# Patient Record
Sex: Male | Born: 1940 | Race: Black or African American | Hispanic: No | Smoking: Former smoker
Health system: Southern US, Community
[De-identification: ages and names within clinical notes are randomized; demographics above are authoritative.]

## PROBLEM LIST (undated history)

## (undated) DIAGNOSIS — J449 Chronic obstructive pulmonary disease, unspecified: Secondary | ICD-10-CM

## (undated) DIAGNOSIS — R079 Chest pain, unspecified: Secondary | ICD-10-CM

## (undated) DIAGNOSIS — F0390 Unspecified dementia without behavioral disturbance: Secondary | ICD-10-CM

## (undated) DIAGNOSIS — E119 Type 2 diabetes mellitus without complications: Secondary | ICD-10-CM

## (undated) DIAGNOSIS — Z87891 Personal history of nicotine dependence: Secondary | ICD-10-CM

## (undated) DIAGNOSIS — Z9181 History of falling: Secondary | ICD-10-CM

## (undated) DIAGNOSIS — I639 Cerebral infarction, unspecified: Secondary | ICD-10-CM

## (undated) DIAGNOSIS — I10 Essential (primary) hypertension: Secondary | ICD-10-CM

## (undated) DIAGNOSIS — E785 Hyperlipidemia, unspecified: Secondary | ICD-10-CM

## (undated) DIAGNOSIS — C801 Malignant (primary) neoplasm, unspecified: Secondary | ICD-10-CM

## (undated) DIAGNOSIS — R42 Dizziness and giddiness: Secondary | ICD-10-CM

## (undated) HISTORY — DX: Cerebral infarction, unspecified: I63.9

## (undated) HISTORY — DX: Personal history of nicotine dependence: Z87.891

## (undated) HISTORY — DX: Hyperlipidemia, unspecified: E78.5

## (undated) HISTORY — DX: Chest pain, unspecified: R07.9

---

## 1997-09-12 ENCOUNTER — Encounter: Admission: RE | Admit: 1997-09-12 | Discharge: 1997-09-12 | Payer: Self-pay | Admitting: Internal Medicine

## 1997-11-10 ENCOUNTER — Encounter: Admission: RE | Admit: 1997-11-10 | Discharge: 1997-11-10 | Payer: Self-pay | Admitting: Internal Medicine

## 1998-01-27 ENCOUNTER — Emergency Department (HOSPITAL_COMMUNITY): Admission: EM | Admit: 1998-01-27 | Discharge: 1998-01-27 | Payer: Self-pay | Admitting: Emergency Medicine

## 1998-02-07 ENCOUNTER — Encounter: Admission: RE | Admit: 1998-02-07 | Discharge: 1998-02-07 | Payer: Self-pay | Admitting: Internal Medicine

## 1998-05-19 ENCOUNTER — Emergency Department (HOSPITAL_COMMUNITY): Admission: EM | Admit: 1998-05-19 | Discharge: 1998-05-19 | Payer: Self-pay | Admitting: Emergency Medicine

## 1998-05-24 ENCOUNTER — Encounter: Admission: RE | Admit: 1998-05-24 | Discharge: 1998-05-24 | Payer: Self-pay | Admitting: Internal Medicine

## 1998-06-04 ENCOUNTER — Emergency Department (HOSPITAL_COMMUNITY): Admission: EM | Admit: 1998-06-04 | Discharge: 1998-06-04 | Payer: Self-pay | Admitting: Emergency Medicine

## 1998-10-16 ENCOUNTER — Ambulatory Visit (HOSPITAL_COMMUNITY): Admission: RE | Admit: 1998-10-16 | Discharge: 1998-10-16 | Payer: Self-pay | Admitting: Internal Medicine

## 1998-10-16 ENCOUNTER — Encounter: Admission: RE | Admit: 1998-10-16 | Discharge: 1998-10-16 | Payer: Self-pay | Admitting: Internal Medicine

## 1998-10-16 ENCOUNTER — Encounter: Payer: Self-pay | Admitting: Internal Medicine

## 1998-11-22 ENCOUNTER — Encounter: Admission: RE | Admit: 1998-11-22 | Discharge: 1998-11-22 | Payer: Self-pay | Admitting: Internal Medicine

## 1998-12-23 ENCOUNTER — Encounter: Payer: Self-pay | Admitting: Emergency Medicine

## 1998-12-23 ENCOUNTER — Emergency Department (HOSPITAL_COMMUNITY): Admission: EM | Admit: 1998-12-23 | Discharge: 1998-12-23 | Payer: Self-pay | Admitting: Emergency Medicine

## 1998-12-25 ENCOUNTER — Encounter: Admission: RE | Admit: 1998-12-25 | Discharge: 1998-12-25 | Payer: Self-pay | Admitting: Hematology and Oncology

## 1999-02-15 ENCOUNTER — Encounter: Admission: RE | Admit: 1999-02-15 | Discharge: 1999-02-15 | Payer: Self-pay | Admitting: Internal Medicine

## 1999-02-27 ENCOUNTER — Encounter: Admission: RE | Admit: 1999-02-27 | Discharge: 1999-02-27 | Payer: Self-pay | Admitting: Internal Medicine

## 1999-03-06 ENCOUNTER — Encounter: Admission: RE | Admit: 1999-03-06 | Discharge: 1999-03-06 | Payer: Self-pay | Admitting: Hematology and Oncology

## 1999-04-02 ENCOUNTER — Encounter: Admission: RE | Admit: 1999-04-02 | Discharge: 1999-04-02 | Payer: Self-pay | Admitting: Internal Medicine

## 1999-08-13 ENCOUNTER — Encounter: Admission: RE | Admit: 1999-08-13 | Discharge: 1999-08-13 | Payer: Self-pay | Admitting: Internal Medicine

## 1999-08-31 ENCOUNTER — Emergency Department (HOSPITAL_COMMUNITY): Admission: EM | Admit: 1999-08-31 | Discharge: 1999-08-31 | Payer: Self-pay | Admitting: Emergency Medicine

## 1999-09-02 ENCOUNTER — Encounter: Admission: RE | Admit: 1999-09-02 | Discharge: 1999-09-02 | Payer: Self-pay | Admitting: Internal Medicine

## 1999-10-08 ENCOUNTER — Encounter: Admission: RE | Admit: 1999-10-08 | Discharge: 1999-10-08 | Payer: Self-pay | Admitting: Internal Medicine

## 1999-12-13 ENCOUNTER — Encounter: Admission: RE | Admit: 1999-12-13 | Discharge: 1999-12-13 | Payer: Self-pay | Admitting: Internal Medicine

## 2000-05-05 ENCOUNTER — Encounter: Admission: RE | Admit: 2000-05-05 | Discharge: 2000-05-05 | Payer: Self-pay | Admitting: Internal Medicine

## 2000-08-10 ENCOUNTER — Emergency Department (HOSPITAL_COMMUNITY): Admission: EM | Admit: 2000-08-10 | Discharge: 2000-08-10 | Payer: Self-pay | Admitting: Emergency Medicine

## 2000-08-11 ENCOUNTER — Encounter: Admission: RE | Admit: 2000-08-11 | Discharge: 2000-08-11 | Payer: Self-pay | Admitting: Internal Medicine

## 2000-09-30 ENCOUNTER — Ambulatory Visit (HOSPITAL_COMMUNITY): Admission: RE | Admit: 2000-09-30 | Discharge: 2000-09-30 | Payer: Self-pay | Admitting: Gastroenterology

## 2000-09-30 ENCOUNTER — Encounter (INDEPENDENT_AMBULATORY_CARE_PROVIDER_SITE_OTHER): Payer: Self-pay | Admitting: Specialist

## 2000-11-24 ENCOUNTER — Emergency Department (HOSPITAL_COMMUNITY): Admission: EM | Admit: 2000-11-24 | Discharge: 2000-11-24 | Payer: Self-pay | Admitting: Emergency Medicine

## 2001-02-23 ENCOUNTER — Ambulatory Visit (HOSPITAL_COMMUNITY): Admission: RE | Admit: 2001-02-23 | Discharge: 2001-02-23 | Payer: Self-pay | Admitting: Internal Medicine

## 2001-02-23 ENCOUNTER — Encounter: Admission: RE | Admit: 2001-02-23 | Discharge: 2001-02-23 | Payer: Self-pay | Admitting: Internal Medicine

## 2001-06-08 ENCOUNTER — Encounter: Admission: RE | Admit: 2001-06-08 | Discharge: 2001-06-08 | Payer: Self-pay | Admitting: Internal Medicine

## 2001-08-19 ENCOUNTER — Encounter: Admission: RE | Admit: 2001-08-19 | Discharge: 2001-08-19 | Payer: Self-pay | Admitting: Internal Medicine

## 2001-10-19 ENCOUNTER — Encounter: Admission: RE | Admit: 2001-10-19 | Discharge: 2001-10-19 | Payer: Self-pay | Admitting: Internal Medicine

## 2001-11-05 ENCOUNTER — Encounter: Admission: RE | Admit: 2001-11-05 | Discharge: 2001-11-05 | Payer: Self-pay | Admitting: Internal Medicine

## 2002-01-18 ENCOUNTER — Encounter: Admission: RE | Admit: 2002-01-18 | Discharge: 2002-01-18 | Payer: Self-pay | Admitting: Internal Medicine

## 2002-01-21 ENCOUNTER — Encounter: Admission: RE | Admit: 2002-01-21 | Discharge: 2002-01-21 | Payer: Self-pay | Admitting: Internal Medicine

## 2002-02-22 ENCOUNTER — Encounter: Admission: RE | Admit: 2002-02-22 | Discharge: 2002-02-22 | Payer: Self-pay | Admitting: Internal Medicine

## 2002-02-22 ENCOUNTER — Encounter: Payer: Self-pay | Admitting: Internal Medicine

## 2002-02-22 ENCOUNTER — Ambulatory Visit (HOSPITAL_COMMUNITY): Admission: RE | Admit: 2002-02-22 | Discharge: 2002-02-22 | Payer: Self-pay | Admitting: Internal Medicine

## 2002-08-23 ENCOUNTER — Encounter: Admission: RE | Admit: 2002-08-23 | Discharge: 2002-08-23 | Payer: Self-pay | Admitting: Internal Medicine

## 2003-03-21 ENCOUNTER — Encounter: Admission: RE | Admit: 2003-03-21 | Discharge: 2003-03-21 | Payer: Self-pay | Admitting: Internal Medicine

## 2003-03-22 ENCOUNTER — Encounter: Admission: RE | Admit: 2003-03-22 | Discharge: 2003-03-22 | Payer: Self-pay | Admitting: Internal Medicine

## 2003-10-11 ENCOUNTER — Encounter (INDEPENDENT_AMBULATORY_CARE_PROVIDER_SITE_OTHER): Payer: Self-pay | Admitting: Specialist

## 2003-10-11 ENCOUNTER — Ambulatory Visit (HOSPITAL_COMMUNITY): Admission: RE | Admit: 2003-10-11 | Discharge: 2003-10-11 | Payer: Self-pay | Admitting: Gastroenterology

## 2003-12-05 ENCOUNTER — Ambulatory Visit: Payer: Self-pay | Admitting: Internal Medicine

## 2004-03-22 ENCOUNTER — Emergency Department (HOSPITAL_COMMUNITY): Admission: EM | Admit: 2004-03-22 | Discharge: 2004-03-22 | Payer: Self-pay

## 2004-03-25 ENCOUNTER — Ambulatory Visit: Payer: Self-pay | Admitting: Internal Medicine

## 2004-03-29 ENCOUNTER — Ambulatory Visit: Payer: Self-pay | Admitting: Internal Medicine

## 2004-04-09 ENCOUNTER — Ambulatory Visit: Payer: Self-pay | Admitting: Internal Medicine

## 2004-04-11 ENCOUNTER — Ambulatory Visit: Payer: Self-pay | Admitting: Internal Medicine

## 2004-04-18 ENCOUNTER — Ambulatory Visit: Payer: Self-pay | Admitting: Internal Medicine

## 2004-07-30 ENCOUNTER — Ambulatory Visit: Payer: Self-pay | Admitting: Internal Medicine

## 2004-11-26 ENCOUNTER — Ambulatory Visit: Payer: Self-pay | Admitting: Internal Medicine

## 2004-11-28 ENCOUNTER — Ambulatory Visit: Payer: Self-pay | Admitting: Internal Medicine

## 2005-01-28 ENCOUNTER — Ambulatory Visit: Payer: Self-pay | Admitting: Internal Medicine

## 2005-05-14 ENCOUNTER — Emergency Department (HOSPITAL_COMMUNITY): Admission: EM | Admit: 2005-05-14 | Discharge: 2005-05-15 | Payer: Self-pay | Admitting: Emergency Medicine

## 2005-05-27 ENCOUNTER — Ambulatory Visit: Payer: Self-pay | Admitting: Internal Medicine

## 2005-06-08 ENCOUNTER — Ambulatory Visit: Payer: Self-pay | Admitting: Internal Medicine

## 2005-06-08 ENCOUNTER — Inpatient Hospital Stay (HOSPITAL_COMMUNITY): Admission: EM | Admit: 2005-06-08 | Discharge: 2005-06-09 | Payer: Self-pay | Admitting: Emergency Medicine

## 2005-06-09 ENCOUNTER — Encounter: Payer: Self-pay | Admitting: Vascular Surgery

## 2005-06-17 ENCOUNTER — Ambulatory Visit: Payer: Self-pay | Admitting: Internal Medicine

## 2005-06-27 ENCOUNTER — Emergency Department (HOSPITAL_COMMUNITY): Admission: EM | Admit: 2005-06-27 | Discharge: 2005-06-27 | Payer: Self-pay | Admitting: Emergency Medicine

## 2005-07-01 ENCOUNTER — Ambulatory Visit: Payer: Self-pay | Admitting: Internal Medicine

## 2005-07-02 ENCOUNTER — Ambulatory Visit: Payer: Self-pay | Admitting: Internal Medicine

## 2005-08-05 ENCOUNTER — Ambulatory Visit: Payer: Self-pay | Admitting: Internal Medicine

## 2005-09-18 ENCOUNTER — Ambulatory Visit (HOSPITAL_COMMUNITY): Admission: RE | Admit: 2005-09-18 | Discharge: 2005-09-18 | Payer: Self-pay | Admitting: Urology

## 2005-09-25 ENCOUNTER — Ambulatory Visit: Admission: RE | Admit: 2005-09-25 | Discharge: 2005-10-29 | Payer: Self-pay | Admitting: Radiation Oncology

## 2005-11-07 ENCOUNTER — Encounter: Admission: RE | Admit: 2005-11-07 | Discharge: 2005-11-07 | Payer: Self-pay | Admitting: Neurosurgery

## 2005-12-03 ENCOUNTER — Ambulatory Visit: Admission: RE | Admit: 2005-12-03 | Discharge: 2006-02-22 | Payer: Self-pay | Admitting: Radiation Oncology

## 2006-01-19 ENCOUNTER — Encounter: Admission: RE | Admit: 2006-01-19 | Discharge: 2006-01-19 | Payer: Self-pay | Admitting: Neurosurgery

## 2006-01-19 LAB — URINALYSIS, MICROSCOPIC - CHCC
Bilirubin (Urine): NEGATIVE
Blood: NEGATIVE
Glucose: NEGATIVE g/dL
Ketones: NEGATIVE mg/dL
Leukocyte Esterase: NEGATIVE
Nitrite: NEGATIVE
Protein: <30 mg/dL
RBC count: NEGATIVE (ref 0–2)
Specific Gravity, Urine: 1.01 (ref 1.003–1.035)
WBC, UA: NEGATIVE (ref 0–2)
pH: 6 (ref 4.6–8.0)

## 2006-01-21 LAB — URINE CULTURE

## 2006-07-03 ENCOUNTER — Ambulatory Visit (HOSPITAL_COMMUNITY): Admission: RE | Admit: 2006-07-03 | Discharge: 2006-07-03 | Payer: Self-pay | Admitting: Gastroenterology

## 2006-08-07 ENCOUNTER — Emergency Department (HOSPITAL_COMMUNITY): Admission: EM | Admit: 2006-08-07 | Discharge: 2006-08-07 | Payer: Self-pay | Admitting: Emergency Medicine

## 2006-09-21 ENCOUNTER — Encounter: Admission: RE | Admit: 2006-09-21 | Discharge: 2006-09-30 | Payer: Self-pay | Admitting: Neurosurgery

## 2007-03-31 ENCOUNTER — Encounter: Admission: RE | Admit: 2007-03-31 | Discharge: 2007-04-21 | Payer: Self-pay | Admitting: Internal Medicine

## 2007-06-10 ENCOUNTER — Encounter: Admission: RE | Admit: 2007-06-10 | Discharge: 2007-06-10 | Payer: Self-pay | Admitting: Neurosurgery

## 2008-04-01 ENCOUNTER — Emergency Department (HOSPITAL_COMMUNITY): Admission: EM | Admit: 2008-04-01 | Discharge: 2008-04-01 | Payer: Self-pay | Admitting: Emergency Medicine

## 2009-03-12 ENCOUNTER — Encounter: Payer: Self-pay | Admitting: Internal Medicine

## 2010-03-28 ENCOUNTER — Encounter
Admission: RE | Admit: 2010-03-28 | Discharge: 2010-03-28 | Payer: Self-pay | Source: Home / Self Care | Attending: Neurosurgery | Admitting: Neurosurgery

## 2010-03-31 ENCOUNTER — Encounter: Payer: Self-pay | Admitting: Neurosurgery

## 2010-03-31 ENCOUNTER — Encounter: Payer: Self-pay | Admitting: Urology

## 2010-05-22 ENCOUNTER — Emergency Department (HOSPITAL_COMMUNITY): Payer: Medicare HMO

## 2010-05-22 ENCOUNTER — Emergency Department (HOSPITAL_COMMUNITY)
Admission: EM | Admit: 2010-05-22 | Discharge: 2010-05-22 | Disposition: A | Discharge status: Home / Self Care | Payer: Medicare HMO | Attending: Emergency Medicine | Admitting: Emergency Medicine

## 2010-05-22 DIAGNOSIS — M51379 Other intervertebral disc degeneration, lumbosacral region without mention of lumbar back pain or lower extremity pain: Secondary | ICD-10-CM | POA: Insufficient documentation

## 2010-05-22 DIAGNOSIS — R109 Unspecified abdominal pain: Secondary | ICD-10-CM | POA: Insufficient documentation

## 2010-05-22 DIAGNOSIS — E785 Hyperlipidemia, unspecified: Secondary | ICD-10-CM | POA: Insufficient documentation

## 2010-05-22 DIAGNOSIS — M5137 Other intervertebral disc degeneration, lumbosacral region: Secondary | ICD-10-CM | POA: Insufficient documentation

## 2010-05-22 DIAGNOSIS — I10 Essential (primary) hypertension: Secondary | ICD-10-CM | POA: Insufficient documentation

## 2010-05-22 DIAGNOSIS — M545 Low back pain, unspecified: Secondary | ICD-10-CM | POA: Insufficient documentation

## 2010-05-22 DIAGNOSIS — K219 Gastro-esophageal reflux disease without esophagitis: Secondary | ICD-10-CM | POA: Insufficient documentation

## 2010-05-22 DIAGNOSIS — E119 Type 2 diabetes mellitus without complications: Secondary | ICD-10-CM | POA: Insufficient documentation

## 2010-05-22 LAB — URINALYSIS, ROUTINE W REFLEX MICROSCOPIC
Bilirubin Urine: NEGATIVE
Glucose, UA: NEGATIVE mg/dL
Hgb urine dipstick: NEGATIVE
Ketones, ur: NEGATIVE mg/dL
Nitrite: NEGATIVE
Protein, ur: NEGATIVE mg/dL
Specific Gravity, Urine: 1.018 (ref 1.005–1.030)
Urobilinogen, UA: 0.2 mg/dL (ref 0.0–1.0)
pH: 5.5 (ref 5.0–8.0)

## 2010-05-22 LAB — GLUCOSE, CAPILLARY: Glucose-Capillary: 141 mg/dL — ABNORMAL HIGH (ref 70–99)

## 2010-07-26 NOTE — Op Note (Signed)
NAME:  Peter Mann, Peter Mann                           ACCOUNT NO.:  [id number]   MEDICAL RECORD NO.:  [id number]                   PATIENT TYPE:  AMB   LOCATION:  ENDO                                 FACILITY:  [facility]   PHYSICIAN:  John C. Madilyn Fireman, M.D.                 DATE OF BIRTH:  02/28/1942   DATE OF PROCEDURE:  10/11/2003  DATE OF DISCHARGE:                                 OPERATIVE REPORT   PROCEDURE:  Colonoscopy with polypectomy.   INDICATIONS FOR PROCEDURE:  History of adenomatous colon polyps   DESCRIPTION OF PROCEDURE:  The patient was placed in the left lateral  decubitus position and placed on the pulse monitor with continuous low flow  oxygen delivered by nasal cannula. He was sedated with 62.5 mcg IV fentanyl  and 6  mg IV Versed. The Olympus video colonoscope was inserted into the  rectum and advanced to the cecum, confirmed by transillumination at  McBurney's point and visualization of the ileocecal valve and appendiceal  orifice. The prep was excellent. The cecum appeared normal with no masses,  polyps, diverticula or other mucosal abnormalities.  Within the ascending  colon, there was an 8 mm polyp which was removed by snare.  In the  transverse colon, there was a similar 8 mm sessile polyp removed by snare.  The descending colon appeared normal.  In the sigmoid colon, there was an 8  mm sessile polyp which was removed by snare. The remainder of the sigmoid  and rectum appeared normal. The scope was then withdrawn and the patient  returned to the recovery room in stable condition.  He tolerated the  procedure well and there were no immediate complications.   IMPRESSION:  Ascending, transverse and sigmoid polyps all removed by snare.   PLAN:  Will await biopsy results and probably repeat study in three years.                                               John C. Madilyn Fireman, M.D.    J[facility]/MEDQ  D:  10/11/2003  T:  10/12/2003  Job:  [id number]

## 2010-07-26 NOTE — Procedures (Signed)
[facility]  Patient:    UMER, HARIG                        MRN: [id number] Proc. Date: 09/30/00 Adm. Date:  [id number] Disc. Date: [id number] Attending:  Molpus, Carlisle Beers CC:         Alvester Morin, M.D.   Procedure Report  PROCEDURE:  Colonoscopy with polypectomy.  SURGEON:  John C. Madilyn Fireman, M.D.  INDICATIONS FOR PROCEDURE:  Heme positive stool.  DESCRIPTION OF PROCEDURE:  The patient was placed in the left lateral decubitus position and placed on the pulse monitor with continuous low flow oxygen delivered by nasal cannula.  He was sedated with 70 mg of IV Demerol and 6 mg of IV Versed.  The Olympus video colonoscope was inserted into the rectum and advanced to the cecum, confirmed by transillumination at McBurneys point, and visualization of the ileocecal valve and appendiceal orifice.  Prep was good.  The cecum and ascending colon appeared normal with no mass, polyp, diverticula, or other mucosal abnormalities.  Within the proximal transverse colon, there was seen a 1 cm sessile polyp which was removed by snare.  A smaller 6 mm polyp was seen in the distal transverse colon, but I had difficulty keeping it in view, and eventually could not relocate it.  It was therefore not removed.  This could not be relocated despite position change and careful inspection.  It was felt that this was unlikely to grow to significant size within two or three years, and it was thus noted that we will attempt to relocate it on the future procedure.  The descending colon appeared normal.  Within the sigmoid colon, there were seen a few scattered diverticula, and no other mucosal abnormalities.  In the rectum, there was an 8 mm polyp which was fulgurated by hot biopsy.  The colonoscope was then withdrawn and the patient returned to the recovery room in stable condition.  He tolerated the procedure well and there were no immediate complications.  IMPRESSION: 1.  Transverse colon polyp, snared and reduced. 2. Smaller transverse colon polyp seen and lost at further visualization, not    removed. 3. An 8 mm rectal polyp. 4. Scattered left-sided diverticula.  PLAN:  Await histology and probably repeat colonoscopy within two to three years with attention to the polyp that was felt to have been seen, but not removed. DD:  09/30/00 TD:  10/01/00 Job: [id number]

## 2011-02-25 ENCOUNTER — Other Ambulatory Visit: Payer: Self-pay | Admitting: Neurosurgery

## 2011-02-25 DIAGNOSIS — N888 Other specified noninflammatory disorders of cervix uteri: Secondary | ICD-10-CM

## 2011-03-01 ENCOUNTER — Ambulatory Visit
Admission: RE | Admit: 2011-03-01 | Discharge: 2011-03-01 | Disposition: A | Discharge status: Home / Self Care | Payer: PRIVATE HEALTH INSURANCE | Source: Ambulatory Visit | Attending: Neurosurgery | Admitting: Neurosurgery

## 2011-03-01 DIAGNOSIS — N888 Other specified noninflammatory disorders of cervix uteri: Secondary | ICD-10-CM

## 2011-06-08 ENCOUNTER — Encounter (HOSPITAL_COMMUNITY): Payer: Self-pay

## 2011-06-08 ENCOUNTER — Emergency Department (HOSPITAL_COMMUNITY)
Admission: EM | Admit: 2011-06-08 | Discharge: 2011-06-08 | Disposition: A | Discharge status: Home / Self Care | Payer: PRIVATE HEALTH INSURANCE | Attending: Emergency Medicine | Admitting: Emergency Medicine

## 2011-06-08 DIAGNOSIS — I10 Essential (primary) hypertension: Secondary | ICD-10-CM | POA: Insufficient documentation

## 2011-06-08 DIAGNOSIS — R42 Dizziness and giddiness: Secondary | ICD-10-CM | POA: Insufficient documentation

## 2011-06-08 DIAGNOSIS — Z79899 Other long term (current) drug therapy: Secondary | ICD-10-CM | POA: Insufficient documentation

## 2011-06-08 HISTORY — DX: Dizziness and giddiness: R42

## 2011-06-08 HISTORY — DX: Essential (primary) hypertension: I10

## 2011-06-08 LAB — COMPREHENSIVE METABOLIC PANEL
ALT: 19 U/L (ref 0–53)
AST: 17 U/L (ref 0–37)
Albumin: 3.3 g/dL — ABNORMAL LOW (ref 3.5–5.2)
Alkaline Phosphatase: 95 U/L (ref 39–117)
BUN: 18 mg/dL (ref 6–23)
CO2: 27 mEq/L (ref 19–32)
Calcium: 9.3 mg/dL (ref 8.4–10.5)
Chloride: 100 mEq/L (ref 96–112)
Creatinine, Ser: 1.02 mg/dL (ref 0.50–1.35)
GFR calc Af Amer: 83 mL/min — ABNORMAL LOW (ref >90)
GFR calc non Af Amer: 71 mL/min — ABNORMAL LOW (ref >90)
Glucose, Bld: 237 mg/dL — ABNORMAL HIGH (ref 70–99)
Potassium: 3.5 mEq/L (ref 3.5–5.1)
Sodium: 137 mEq/L (ref 135–145)
Total Bilirubin: 0.5 mg/dL (ref 0.3–1.2)
Total Protein: 6.7 g/dL (ref 6.0–8.3)

## 2011-06-08 LAB — GLUCOSE, CAPILLARY: Glucose-Capillary: 228 mg/dL — ABNORMAL HIGH (ref 70–99)

## 2011-06-08 LAB — URINALYSIS, ROUTINE W REFLEX MICROSCOPIC
Bilirubin Urine: NEGATIVE
Glucose, UA: 250 mg/dL — AB
Hgb urine dipstick: NEGATIVE
Ketones, ur: NEGATIVE mg/dL
Leukocytes, UA: NEGATIVE
Nitrite: NEGATIVE
Protein, ur: NEGATIVE mg/dL
Specific Gravity, Urine: 1.009 (ref 1.005–1.030)
Urobilinogen, UA: 1 mg/dL (ref 0.0–1.0)
pH: 6.5 (ref 5.0–8.0)

## 2011-06-08 LAB — DIFFERENTIAL
Basophils Absolute: 0 10*3/uL (ref 0.0–0.1)
Basophils Relative: 0 % (ref 0–1)
Eosinophils Absolute: 0.1 10*3/uL (ref 0.0–0.7)
Eosinophils Relative: 2 % (ref 0–5)
Lymphocytes Relative: 19 % (ref 12–46)
Lymphs Abs: 0.5 10*3/uL — ABNORMAL LOW (ref 0.7–4.0)
Monocytes Absolute: 0.4 10*3/uL (ref 0.1–1.0)
Monocytes Relative: 17 % — ABNORMAL HIGH (ref 3–12)
Neutro Abs: 1.5 10*3/uL — ABNORMAL LOW (ref 1.7–7.7)
Neutrophils Relative %: 62 % (ref 43–77)

## 2011-06-08 LAB — CARDIAC PANEL(CRET KIN+CKTOT+MB+TROPI)
CK, MB: 2.1 ng/mL (ref 0.3–4.0)
Relative Index: 1.6 (ref 0.0–2.5)
Total CK: 131 U/L (ref 7–232)
Troponin I: <0.3 ng/mL (ref <0.30)

## 2011-06-08 LAB — CBC
HCT: 40.3 % (ref 39.0–52.0)
Hemoglobin: 13.2 g/dL (ref 13.0–17.0)
MCH: 27.8 pg (ref 26.0–34.0)
MCHC: 32.8 g/dL (ref 30.0–36.0)
MCV: 85 fL (ref 78.0–100.0)
Platelets: 189 10*3/uL (ref 150–400)
RBC: 4.74 MIL/uL (ref 4.22–5.81)
RDW: 14.7 % (ref 11.5–15.5)
WBC: 2.4 10*3/uL — ABNORMAL LOW (ref 4.0–10.5)

## 2011-06-08 MED ORDER — MECLIZINE HCL 25 MG PO TABS
25.0000 mg | ORAL_TABLET | Freq: Once | ORAL | Status: AC
  Administered 2011-06-08: 25 mg via ORAL
  Filled 2011-06-08: qty 1

## 2011-06-08 MED ORDER — MECLIZINE HCL 25 MG PO TABS: 25.0000 mg | ORAL_TABLET | Freq: Four times a day (QID) | ORAL | Status: AC

## 2011-06-08 NOTE — ED Provider Notes (Signed)
History     CSN: [id number]  Arrival date & time 06/08/11  4098   First MD Initiated Contact with Patient 06/08/11 [phone]      Chief Complaint  Patient presents with  . Dizziness    (Consider location/radiation/quality/duration/timing/severity/associated sxs/prior treatment) HPI  Patient presents to the ED with complaints of dizziness for 5 weeks. He states that it always begins in the morning and lasts 1-2 hours at a time. It feels like he is rocking on a boat. He has no associated blurry vision, headache, chest pain, weakness. He states he remembers having the problem 17 years ago but doesn't remember what it was or how they treated it. The patient is no longer having symptoms at this time but decided to come in because it happened three times in a row including this morning,  Past Medical History  Diagnosis Date  . Dizziness   . Hypertension     History reviewed. No pertinent past surgical history.  No family history on file.  History  Substance Use Topics  . Smoking status: Former Games developer  . Smokeless tobacco: Not on file  . Alcohol Use: No      Review of Systems  All other systems reviewed and are negative.    Allergies  Review of patient's allergies indicates no known allergies.  Home Medications   Current Outpatient Rx  Name Route Sig Dispense Refill  . AMLODIPINE BESYLATE 10 MG PO TABS Oral Take 10 mg by mouth daily.    . DEXLANSOPRAZOLE 60 MG PO CPDR Oral Take 60 mg by mouth daily.    Marland Kitchen HYDROCHLOROTHIAZIDE 12.5 MG PO TABS Oral Take 12.5 mg by mouth daily.    Marland Kitchen HYDROCODONE-ACETAMINOPHEN 5-500 MG PO TABS Oral Take 1 tablet by mouth every 6 (six) hours as needed.    Marland Kitchen METOPROLOL SUCCINATE ER 50 MG PO TB24 Oral Take 50 mg by mouth daily. Take with or immediately following a meal.    . OMEPRAZOLE 40 MG PO CPDR Oral Take 40 mg by mouth daily.    Marland Kitchen POLYETHYLENE GLYCOL 3350 PO PACK Oral Take 17 g by mouth daily.    Marland Kitchen PROMETHAZINE HCL 25 MG PO TABS Oral Take 25  mg by mouth every 6 (six) hours as needed.    Marland Kitchen ROSUVASTATIN CALCIUM 20 MG PO TABS Oral Take 20 mg by mouth daily.    Marland Kitchen TAMSULOSIN HCL 0.4 MG PO CAPS Oral Take 0.4 mg by mouth at bedtime.    Marland Kitchen MECLIZINE HCL 25 MG PO TABS Oral Take 1 tablet (25 mg total) by mouth 4 (four) times daily. 28 tablet 0    BP 135/76  Pulse 60  Temp(Src) 98.3 F (36.8 C) (Oral)  Resp 20  Ht 5\' 7"  (1.702 m)  Wt 210 lb (95.255 kg)  BMI 32.89 kg/m2  SpO2 96%  Physical Exam  Nursing note and vitals reviewed. Constitutional: He appears well-developed and well-nourished. No distress.  HENT:  Head: Normocephalic and atraumatic.  Eyes: Pupils are equal, round, and reactive to light.  Neck: Normal range of motion. Neck supple.  Cardiovascular: Normal rate and regular rhythm.   Pulmonary/Chest: Effort normal.  Abdominal: Soft.  Neurological: He is alert. No cranial nerve deficit. Coordination normal.  Skin: Skin is warm and dry.    ED Course  Procedures (including critical care time)  Labs Reviewed  CBC - Abnormal; Notable for the following:    WBC 2.4 (*)    All other components within normal limits  DIFFERENTIAL -  Abnormal; Notable for the following:    Neutro Abs 1.5 (*)    Lymphs Abs 0.5 (*)    Monocytes Relative 17 (*)    All other components within normal limits  COMPREHENSIVE METABOLIC PANEL - Abnormal; Notable for the following:    Glucose, Bld 237 (*)    Albumin 3.3 (*)    GFR calc non Af Amer 71 (*)    GFR calc Af Amer 83 (*)    All other components within normal limits  URINALYSIS, ROUTINE W REFLEX MICROSCOPIC - Abnormal; Notable for the following:    Glucose, UA 250 (*)    All other components within normal limits  GLUCOSE, CAPILLARY - Abnormal; Notable for the following:    Glucose-Capillary 228 (*)    All other components within normal limits  CARDIAC PANEL(CRET KIN+CKTOT+MB+TROPI)   No results found.   1. Vertigo       MDM   Pt evaluated by myself and Dr. Ignacia Palma.  Symptoms are consistent with vertigo. Pt given Rx for Meclozine 25mg  tab to be used when he feels dizzy.  Pt has been advised of the symptoms that warrant their return to the ED. Patient has voiced understanding and has agreed to follow-up with the PCP or specialist.         Dorthula Matas, PA 06/08/11 1125

## 2011-06-08 NOTE — Discharge Instructions (Signed)
Benign Positional Vertigo  Vertigo means you feel like you or your surroundings are moving when they are not. Benign positional vertigo is the most common form of vertigo. Benign means that the cause of your condition is not serious. Benign positional vertigo is more common in older adults.  CAUSES    Benign positional vertigo is the result of an upset in the labyrinth system. This is an area in the middle ear that helps control your balance. This may be caused by a viral infection, head injury, or repetitive motion. However, often no specific cause is found.  SYMPTOMS    Symptoms of benign positional vertigo occur when you move your head or eyes in different directions. Some of the symptoms may include:   Loss of balance and falls.    Vomiting.    Blurred vision.    Dizziness.    Nausea.    Involuntary eye movements (nystagmus).   DIAGNOSIS    Benign positional vertigo is usually diagnosed by physical exam. If the specific cause of your benign positional vertigo is unknown, your caregiver may perform imaging tests, such as magnetic resonance imaging (MRI) or computed tomography (CT).  TREATMENT    Your caregiver may recommend movements or procedures to correct the benign positional vertigo. Medicines such as meclizine, benzodiazepines, and medicines for nausea may be used to treat your symptoms. In rare cases, if your symptoms are caused by certain conditions that affect the inner ear, you may need surgery.  HOME CARE INSTRUCTIONS     Follow your caregiver's instructions.    Move slowly. Do not make sudden body or head movements.    Avoid driving.    Avoid operating heavy machinery.    Avoid performing any tasks that would be dangerous to you or others during a vertigo episode.    Drink enough fluids to keep your urine clear or pale yellow.   SEEK IMMEDIATE MEDICAL CARE IF:     You develop problems with walking, weakness, numbness, or using your arms, hands, or legs.    You have difficulty speaking.     You develop severe headaches.    Your nausea or vomiting continues or gets worse.    You develop visual changes.    Your family or friends notice any behavioral changes.    Your condition gets worse.    You have a fever.    You develop a stiff neck or sensitivity to light.   MAKE SURE YOU:     Understand these instructions.    Will watch your condition.    Will get help right away if you are not doing well or get worse.   Document Released: 12/02/2005 Document Revised: 02/13/2011 Document Reviewed: 11/14/2010  [employer] Patient Information 2012 [employer].    Dizziness  Dizziness is a common problem. It is a feeling of unsteadiness or lightheadedness. You may feel like you are about to faint. Dizziness can lead to injury if you stumble or fall. A person of any age group can suffer from dizziness, but dizziness is more common in older adults.  CAUSES    Dizziness can be caused by many different things, including:   Middle ear problems.    Standing for too long.    Infections.    An allergic reaction.    Aging.    An emotional response to something, such as the sight of blood.    Side effects of medicines.    Fatigue.    Problems with circulation or   pressure (hypertension), diabetes, and thyroid problems.   Exposure to extreme heat.  DIAGNOSIS  To find the cause of your dizziness, your caregiver may do a physical exam, lab tests, radiologic imaging scans, or an electrocardiography test (ECG).  TREATMENT  Treatment of dizziness depends on the cause of your symptoms and can vary greatly. HOME CARE INSTRUCTIONS   Drink enough fluids to keep  your urine clear or pale yellow. This is especially important in very hot weather. In the elderly, it is also important in cold weather.   If your dizziness is caused by medicines, take them exactly as directed. When taking blood pressure medicines, it is especially important to get up slowly.   Rise slowly from chairs and steady yourself until you feel okay.   In the morning, first sit up on the side of the bed. When this seems okay, stand slowly while holding onto something until you know your balance is fine.   If you need to stand in one place for a long time, be sure to move your legs often. Tighten and relax the muscles in your legs while standing.   If dizziness continues to be a problem, have someone stay with you for a day or two. Do this until you feel you are well enough to stay alone. Have the person call your caregiver if he or she notices changes in you that are concerning.   Do not drive or use heavy machinery if you feel dizzy.  SEEK IMMEDIATE MEDICAL CARE IF:   Your dizziness or lightheadedness gets worse.   You feel nauseous or vomit.   You develop problems with talking, walking, weakness, or using your arms, hands, or legs.   You are not thinking clearly or you have difficulty forming sentences. It may take a friend or family member to determine if your thinking is normal.   You develop chest pain, abdominal pain, shortness of breath, or sweating.   Your vision changes.   You notice any bleeding.   You have side effects from medicine that seems to be getting worse rather than better.  MAKE SURE YOU:   Understand these instructions.   Will watch your condition.   Will get help right away if you are not doing well or get worse.  Document Released: 08/20/2000 Document Revised: 02/13/2011 Document Reviewed: 09/13/2010 [facility] Patient Information 2012 [place], [place].

## 2011-06-08 NOTE — ED Notes (Signed)
Pt. Has been having intermittent dizziness for over 5 weeks.  Pt. Denies any chest  Pain or sob

## 2011-06-08 NOTE — ED Notes (Signed)
PA at bedside.

## 2011-06-08 NOTE — ED Notes (Signed)
CBG 228 completed

## 2011-06-09 NOTE — ED Provider Notes (Signed)
Medical screening examination/treatment/procedure(s) were conducted as a shared visit with non-physician practitioner(s) and myself.  I personally evaluated the patient during the encounter 71 yo man with dizziness in mornings, no recent URI, is hypertensive.  Exam shows heart and lungs clear, neur nonfocal.  Rx with Antivert 25 mg prn dizziness.  Carleene Cooper III, MD 06/09/11 [phone]

## 2011-12-18 ENCOUNTER — Other Ambulatory Visit: Payer: Self-pay | Admitting: Radiation Oncology

## 2012-09-15 ENCOUNTER — Other Ambulatory Visit: Payer: Self-pay | Admitting: Internal Medicine

## 2012-09-15 DIAGNOSIS — H8110 Benign paroxysmal vertigo, unspecified ear: Secondary | ICD-10-CM

## 2013-02-01 ENCOUNTER — Encounter: Payer: Self-pay | Admitting: Cardiovascular Disease

## 2013-02-01 ENCOUNTER — Ambulatory Visit (INDEPENDENT_AMBULATORY_CARE_PROVIDER_SITE_OTHER): Payer: PRIVATE HEALTH INSURANCE | Admitting: Cardiovascular Disease

## 2013-02-01 VITALS — BP 135/82 | HR 61 | Ht 67.0 in | Wt 203.0 lb

## 2013-02-01 DIAGNOSIS — R079 Chest pain, unspecified: Secondary | ICD-10-CM

## 2013-02-01 DIAGNOSIS — I10 Essential (primary) hypertension: Secondary | ICD-10-CM | POA: Insufficient documentation

## 2013-02-01 DIAGNOSIS — E785 Hyperlipidemia, unspecified: Secondary | ICD-10-CM | POA: Insufficient documentation

## 2013-02-01 NOTE — Assessment & Plan Note (Signed)
Under good control of her medications 

## 2013-02-01 NOTE — Patient Instructions (Addendum)
Follow up with Dr Allyson Sabal as needed.   Dr Allyson Sabal has ordered an exercise myoview (stress test)

## 2013-02-01 NOTE — Assessment & Plan Note (Signed)
On statin therapy followed by his PCP 

## 2013-02-01 NOTE — Progress Notes (Signed)
02/01/2013 Peter Mann   1940/04/07  [id number]  Primary Physician Kristian Covey, MD Primary Cardiologist: Runell Gess MD [name]   HPI:  Peter Mann is a 72 year old mildly overweight separated African American male father of 2 children referred by Dr. Ramond Craver for cardiovascular evaluation because of chest pain. 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 His cardiac risk factor profile is positive for 100-pack-year history of tobacco abuse having quit 3 years ago. History of hypertension and dyslipidemia. He also has a history of prostate cancer. The chest pain for about a year occurring several times a month.   Current Outpatient Prescriptions  Medication Sig Dispense Refill  . ALPRAZolam (XANAX) 0.25 MG tablet       . amLODipine (NORVASC) 10 MG tablet Take 10 mg by mouth daily.      Marland Kitchen dexlansoprazole (DEXILANT) 60 MG capsule Take 60 mg by mouth daily.      . hydrochlorothiazide (HYDRODIURIL) 12.5 MG tablet Take 12.5 mg by mouth daily.      Marland Kitchen HYDROcodone-acetaminophen (VICODIN) 5-500 MG per tablet Take 1 tablet by mouth every 6 (six) hours as needed.      . metoprolol (LOPRESSOR) 50 MG tablet       . metoprolol succinate (TOPROL-XL) 50 MG 24 hr tablet Take 50 mg by mouth daily. Take with or immediately following a meal.      . omeprazole (PRILOSEC) 40 MG capsule Take 40 mg by mouth daily.      . polyethylene glycol (MIRALAX / GLYCOLAX) packet Take 17 g by mouth daily.      . promethazine (PHENERGAN) 25 MG tablet Take 25 mg by mouth every 6 (six) hours as needed.      . rosuvastatin (CRESTOR) 20 MG tablet Take 20 mg by mouth daily.      . Tamsulosin HCl (FLOMAX) 0.4 MG CAPS TAKE 1 CAPSULE BY MOUTH AT BEDTIME  30 capsule  10  . traMADol (ULTRAM) 50 MG tablet        No current facility-administered medications for this visit.    No Known Allergies  History   Social History  . Marital Status: Legally Separated    Spouse Name: N/A   Number of Children: N/A  . Years of Education: N/A   Occupational History  . Not on file.   Social History Main Topics  . Smoking status: Former Smoker    Quit date: 02/01/2010  . Smokeless tobacco: Not on file  . Alcohol Use: No     Comment: quit 22 years ago   . Drug Use: No  . Sexual Activity: Not on file   Other Topics Concern  . Not on file   Social History Narrative  . No narrative on file     Review of Systems: General: negative for chills, fever, night sweats or weight changes.  Cardiovascular: negative for chest pain, dyspnea on exertion, edema, orthopnea, palpitations, paroxysmal nocturnal dyspnea or shortness of breath Dermatological: negative for rash Respiratory: negative for cough or wheezing Urologic: negative for hematuria Abdominal: negative for nausea, vomiting, diarrhea, bright red blood per rectum, melena, or hematemesis Neurologic: negative for visual changes, syncope, or dizziness All other systems reviewed and are otherwise negative except as noted above.    Blood pressure 135/82, pulse 61, height 5\' 7"  (1.702 m), weight 203 lb (92.08 kg).  General appearance: alert and no distress Neck: no adenopathy, no carotid bruit, no JVD, supple, symmetrical, trachea midline and thyroid not  enlarged, symmetric, no tenderness/mass/nodules Lungs: clear to auscultation bilaterally Heart: regular rate and rhythm, S1, S2 normal, no murmur, click, rub or gallop Abdomen: soft, non-tender; bowel sounds normal; no masses,  no organomegaly Extremities: extremities normal, atraumatic, no cyanosis or edema Pulses: 2+ and symmetric  EKG sinus bradycardia at 55 without ST or T wave changes  ASSESSMENT AND PLAN:   Chest pain Patient has had chest pain for several years. Occurs several times a month. As his factors include over 100-pack-year history of tobacco abuse, 2 hypertension and hyperlipidemia. I am going to get an exercise Myoview stress test to rule out an  ischemic etiology.  Essential hypertension Under good control of her medications  Hyperlipidemia On statin therapy followed by his PCP      Runell Gess MD [facility], [facility] 02/01/2013 4:40 PM

## 2013-02-01 NOTE — Assessment & Plan Note (Signed)
Patient has had chest pain for several years. Occurs several times a month. As his factors include over 100-pack-year history of tobacco abuse, 2 hypertension and hyperlipidemia. I am going to get an exercise Myoview stress test to rule out an ischemic etiology.

## 2013-02-02 ENCOUNTER — Encounter: Payer: Self-pay | Admitting: Cardiovascular Disease

## 2013-02-10 ENCOUNTER — Telehealth: Payer: Self-pay | Admitting: Cardiovascular Disease

## 2013-02-10 NOTE — Telephone Encounter (Signed)
Attempted to contact patient regarding authorization for Nuc tomorrow 02/11/13. The phone number in the system has been disconnected. I attempted to contact Tammy Patterson-listed as the patient's daughter and left a voicemail to return my call. Work number for [employer] is no longer valid.  The insurance Berkley Harvey is still pending for this patient and we will receive notification by the end of the day today. Because there is a chance that this test could be denied, I suggest that we cancel this appt until Berkley Harvey is obtained. Alternatively, the patient has the option of keeping the appt and waiting to see if the Nuc is approved. We can contact the patient by 4 pm to notify if Nuc was approved or denied and proceed from there, if they wish. I'll wait to hear back from the patient or his daughter until 4 pm. If I have not heard from the patient and the insurance is still pending or denied, I will cancel the appt and attempt to make contact again to notify of cancellation.

## 2013-02-11 ENCOUNTER — Encounter (HOSPITAL_COMMUNITY): Payer: PRIVATE HEALTH INSURANCE

## 2013-02-11 NOTE — Telephone Encounter (Signed)
The insurance determination requires Peer Review. Appt cancelled until auth is obtained. Patient is aware and his new phone number is in the system.

## 2013-02-16 ENCOUNTER — Other Ambulatory Visit: Payer: Self-pay | Admitting: Radiation Oncology

## 2013-02-16 ENCOUNTER — Telehealth: Payer: Self-pay | Admitting: *Deleted

## 2013-02-16 DIAGNOSIS — R079 Chest pain, unspecified: Secondary | ICD-10-CM

## 2013-02-16 NOTE — Telephone Encounter (Signed)
Returned Kathryn's call

## 2013-02-16 NOTE — Telephone Encounter (Signed)
Insurance company will not pay for Peter Mann.  Per Dr Allyson Sabal ordered was changed to treamill test.  lmom for patient

## 2013-02-16 NOTE — Telephone Encounter (Signed)
Patient notified that myoview was changed to plain treadmill test.

## 2013-02-17 ENCOUNTER — Telehealth (HOSPITAL_COMMUNITY): Payer: Self-pay | Admitting: *Deleted

## 2013-02-17 NOTE — Telephone Encounter (Signed)
Per Dr. Allyson Sabal, change to a plain Treadmill test. Peter Mann will notify the patient and put in the order. I have notified scheduling.

## 2013-02-28 ENCOUNTER — Telehealth (HOSPITAL_COMMUNITY): Payer: Self-pay | Admitting: *Deleted

## 2013-03-07 ENCOUNTER — Telehealth (HOSPITAL_COMMUNITY): Payer: Self-pay | Admitting: *Deleted

## 2013-03-08 ENCOUNTER — Encounter (HOSPITAL_COMMUNITY): Payer: PRIVATE HEALTH INSURANCE

## 2013-03-08 ENCOUNTER — Other Ambulatory Visit (HOSPITAL_COMMUNITY): Payer: Self-pay | Admitting: Cardiovascular Disease

## 2013-03-08 DIAGNOSIS — R079 Chest pain, unspecified: Secondary | ICD-10-CM

## 2013-03-11 ENCOUNTER — Ambulatory Visit (HOSPITAL_COMMUNITY)
Admission: RE | Admit: 2013-03-11 | Discharge: 2013-03-11 | Disposition: A | Discharge status: Home / Self Care | Payer: PRIVATE HEALTH INSURANCE | Source: Ambulatory Visit | Attending: Cardiology | Admitting: Cardiology

## 2013-03-11 DIAGNOSIS — R079 Chest pain, unspecified: Secondary | ICD-10-CM

## 2013-03-11 MED ORDER — TECHNETIUM TC 99M SESTAMIBI GENERIC - CARDIOLITE
30.8000 | Freq: Once | INTRAVENOUS | Status: AC | PRN
  Administered 2013-03-11: 30.8 via INTRAVENOUS

## 2013-03-11 MED ORDER — TECHNETIUM TC 99M SESTAMIBI GENERIC - CARDIOLITE
10.9000 | Freq: Once | INTRAVENOUS | Status: AC | PRN
  Administered 2013-03-11: 10.9 via INTRAVENOUS

## 2013-03-11 NOTE — Procedures (Addendum)
[place] [address] [place] [id number] [phone]  Cardiology Nuclear Med Study  Peter Mann is a 72 y.o. male     MRN : [id number]     DOB: December 24, 1940  Procedure Date: 03/11/2013  Nuclear Med Background Indication for Stress Test:  Evaluation for Ischemia History:  No prior history reported. Cardiac Risk Factors: History of Smoking, Hypertension, Lipids, NIDDM and Overweight  Symptoms:  Chest Pain, Dizziness, Light-Headedness and Palpitations   Nuclear Pre-Procedure Caffeine/Decaff Intake:  7:00pm NPO After: 5:00am   IV Site: R Forearm  IV 0.9% NS with Angio Cath:  22g  Chest Size (in):  40"  IV Started by: Azucena Cecil, RN  Height: 5\' 7"  (1.702 m)  Cup Size: n/a  BMI:  Body mass index is 31.79 kg/(m^2). Weight:  203 lb (92.08 kg)   Tech Comments:  n/a    Nuclear Med Study 1 or 2 day study: 1 day  Stress Test Type:  Stress  Order Authorizing Provider:  Quay Burow, MD   Resting Radionuclide: Technetium 73m Sestamibi  Resting Radionuclide Dose: 10.9 mCi   Stress Radionuclide:  Technetium 38m Sestamibi  Stress Radionuclide Dose: 30.8 mCi           Stress Protocol Rest HR:56 Stress HR: 134  Rest BP: 150/95 Stress BP: 199/76  Exercise Time (min): 7:01 METS: 7.00   Predicted Max HR: 147 bpm % Max HR: 91.16 bpm Rate Pressure Product: [phone]  Dose of Adenosine (mg):  n/a Dose of Lexiscan: n/a mg  Dose of Atropine (mg): n/a Dose of Dobutamine: n/a mcg/kg/min (at max HR)  Stress Test Technologist: Mellody Memos, CCT Nuclear Technologist: Imagene Riches, CNMT   Rest Procedure:  Myocardial perfusion imaging was performed at rest 45 minutes following the intravenous administration of Technetium 77m Sestamibi. Stress Procedure:  The patient performed treadmill exercise using a Bruce  Protocol for 7 minutes and 1 second. The patient stopped due to shortness of breath and fatigue. Patient denied any chest pain.   There were no significant ST-T wave changes.  Technetium 10m Sestamibi was injected at peak exercise and myocardial perfusion imaging was performed after a brief delay.  Transient Ischemic Dilatation (Normal <1.22):  0.95 Lung/Heart Ratio (Normal <0.45):  0.35 QGS EDV:  77 ml QGS ESV:  31 ml LV Ejection Fraction: 60%  Signed by Imagene Riches, CNMT  PHYSICIAN INTERPRETATINO  Rest ECG: NSR with non-specific ST-T wave changes  Stress ECG: No significant change from baseline ECG and No significant ST segment change suggestive of ischemia.  QPS Raw Data Images:  Mild diaphragmatic attenuation.  Normal left ventricular size. Stress Images:  Normal homogeneous uptake in all areas of the myocardium. With mild diaphragmatic attenuation Rest Images:  Normal homogeneous uptake in all areas of the myocardium. With mild diaphragmatic attenuation Subtraction (SDS):  There is no evidence of scar or ischemia.  Impression Exercise Capacity:  Fair exercise capacity. BP Response:  Hypertensive blood pressure response. Clinical Symptoms:  There is dyspnea. & fatigue ECG Impression:  No significant ST segment change suggestive of ischemia. Comparison with Prior Nuclear Study: No changes LV Wall Motion:  NL LV Function; NL Wall Motion   Overall Impression:  Normal stress nuclear study.    [name], MD  03/11/2013 1:16 PM

## 2013-03-15 ENCOUNTER — Encounter: Payer: Self-pay | Admitting: *Deleted

## 2013-07-11 ENCOUNTER — Other Ambulatory Visit: Payer: Self-pay | Admitting: Radiation Oncology

## 2013-11-05 ENCOUNTER — Other Ambulatory Visit: Payer: Self-pay | Admitting: Radiation Oncology

## 2013-11-07 ENCOUNTER — Other Ambulatory Visit: Payer: Self-pay | Admitting: Neurosurgery

## 2013-11-07 DIAGNOSIS — M5412 Radiculopathy, cervical region: Secondary | ICD-10-CM

## 2013-12-01 ENCOUNTER — Ambulatory Visit
Admission: RE | Admit: 2013-12-01 | Discharge: 2013-12-01 | Disposition: A | Discharge status: Home / Self Care | Payer: PRIVATE HEALTH INSURANCE | Source: Ambulatory Visit | Attending: Neurosurgery | Admitting: Neurosurgery

## 2013-12-01 DIAGNOSIS — M5412 Radiculopathy, cervical region: Secondary | ICD-10-CM

## 2013-12-07 ENCOUNTER — Other Ambulatory Visit: Payer: Self-pay | Admitting: Radiation Oncology

## 2014-01-21 ENCOUNTER — Emergency Department (HOSPITAL_COMMUNITY)
Admission: EM | Admit: 2014-01-21 | Discharge: 2014-01-21 | Disposition: A | Discharge status: Home / Self Care | Payer: PRIVATE HEALTH INSURANCE | Attending: Emergency Medicine | Admitting: Emergency Medicine

## 2014-01-21 ENCOUNTER — Other Ambulatory Visit: Payer: Self-pay

## 2014-01-21 ENCOUNTER — Emergency Department (HOSPITAL_COMMUNITY): Payer: PRIVATE HEALTH INSURANCE

## 2014-01-21 ENCOUNTER — Encounter (HOSPITAL_COMMUNITY): Payer: Self-pay | Admitting: Emergency Medicine

## 2014-01-21 DIAGNOSIS — R109 Unspecified abdominal pain: Secondary | ICD-10-CM | POA: Insufficient documentation

## 2014-01-21 DIAGNOSIS — R110 Nausea: Secondary | ICD-10-CM | POA: Insufficient documentation

## 2014-01-21 DIAGNOSIS — E785 Hyperlipidemia, unspecified: Secondary | ICD-10-CM | POA: Insufficient documentation

## 2014-01-21 DIAGNOSIS — Z79899 Other long term (current) drug therapy: Secondary | ICD-10-CM | POA: Diagnosis not present

## 2014-01-21 DIAGNOSIS — Z7952 Long term (current) use of systemic steroids: Secondary | ICD-10-CM | POA: Diagnosis not present

## 2014-01-21 DIAGNOSIS — K5900 Constipation, unspecified: Secondary | ICD-10-CM | POA: Diagnosis not present

## 2014-01-21 DIAGNOSIS — Z87891 Personal history of nicotine dependence: Secondary | ICD-10-CM | POA: Insufficient documentation

## 2014-01-21 DIAGNOSIS — I10 Essential (primary) hypertension: Secondary | ICD-10-CM | POA: Insufficient documentation

## 2014-01-21 LAB — COMPREHENSIVE METABOLIC PANEL
ALT: 12 U/L (ref 0–53)
AST: 17 U/L (ref 0–37)
Albumin: 4 g/dL (ref 3.5–5.2)
Alkaline Phosphatase: 118 U/L — ABNORMAL HIGH (ref 39–117)
Anion gap: 14 (ref 5–15)
BUN: 21 mg/dL (ref 6–23)
CO2: 27 mEq/L (ref 19–32)
Calcium: 9.4 mg/dL (ref 8.4–10.5)
Chloride: 93 mEq/L — ABNORMAL LOW (ref 96–112)
Creatinine, Ser: 1.23 mg/dL (ref 0.50–1.35)
GFR calc Af Amer: 65 mL/min — ABNORMAL LOW (ref >90)
GFR calc non Af Amer: 56 mL/min — ABNORMAL LOW (ref >90)
Glucose, Bld: 165 mg/dL — ABNORMAL HIGH (ref 70–99)
Potassium: 3.6 mEq/L — ABNORMAL LOW (ref 3.7–5.3)
Sodium: 134 mEq/L — ABNORMAL LOW (ref 137–147)
Total Bilirubin: 0.4 mg/dL (ref 0.3–1.2)
Total Protein: 7.6 g/dL (ref 6.0–8.3)

## 2014-01-21 LAB — URINALYSIS, ROUTINE W REFLEX MICROSCOPIC
Bilirubin Urine: NEGATIVE
Glucose, UA: NEGATIVE mg/dL
Hgb urine dipstick: NEGATIVE
Ketones, ur: NEGATIVE mg/dL
Leukocytes, UA: NEGATIVE
Nitrite: NEGATIVE
Protein, ur: NEGATIVE mg/dL
Specific Gravity, Urine: 1.018 (ref 1.005–1.030)
Urobilinogen, UA: 1 mg/dL (ref 0.0–1.0)
pH: 8 (ref 5.0–8.0)

## 2014-01-21 LAB — CBC WITH DIFFERENTIAL/PLATELET
Basophils Absolute: 0 10*3/uL (ref 0.0–0.1)
Basophils Relative: 0 % (ref 0–1)
Eosinophils Absolute: 0.1 10*3/uL (ref 0.0–0.7)
Eosinophils Relative: 1 % (ref 0–5)
HCT: 40.1 % (ref 39.0–52.0)
Hemoglobin: 13.2 g/dL (ref 13.0–17.0)
Lymphocytes Relative: 8 % — ABNORMAL LOW (ref 12–46)
Lymphs Abs: 0.5 10*3/uL — ABNORMAL LOW (ref 0.7–4.0)
MCH: 27.3 pg (ref 26.0–34.0)
MCHC: 32.9 g/dL (ref 30.0–36.0)
MCV: 83 fL (ref 78.0–100.0)
Monocytes Absolute: 0.6 10*3/uL (ref 0.1–1.0)
Monocytes Relative: 11 % (ref 3–12)
Neutro Abs: 4.4 10*3/uL (ref 1.7–7.7)
Neutrophils Relative %: 80 % — ABNORMAL HIGH (ref 43–77)
Platelets: 204 10*3/uL (ref 150–400)
RBC: 4.83 MIL/uL (ref 4.22–5.81)
RDW: 14 % (ref 11.5–15.5)
WBC: 5.6 10*3/uL (ref 4.0–10.5)

## 2014-01-21 LAB — I-STAT TROPONIN, ED: Troponin i, poc: 0.02 ng/mL (ref 0.00–0.08)

## 2014-01-21 MED ORDER — HYDROCODONE-ACETAMINOPHEN 5-325 MG PO TABS: 1.0000 | ORAL_TABLET | Freq: Four times a day (QID) | ORAL | Status: DC | PRN

## 2014-01-21 MED ORDER — IOHEXOL 300 MG/ML  SOLN
100.0000 mL | Freq: Once | INTRAMUSCULAR | Status: AC | PRN
  Administered 2014-01-21: 100 mL via INTRAVENOUS

## 2014-01-21 NOTE — ED Notes (Signed)
Gave pt. A urinal to use

## 2014-01-21 NOTE — ED Notes (Signed)
Pt reports bilateral lower side pain for 6-8 weeks. sts he thought it was gas pains and has been taking milk of mag with relief but today pain is worse. Last BM 4pm yesterday. Pt also c/o some nausea. Denies urinary sx, fevers, chills.

## 2014-01-21 NOTE — Discharge Instructions (Signed)
Workup here today negative except for the finding of gallstones. If you get severe increased pain in the right upper part of your abdomen or directly behind it on the back. This could be symptomatic gallstones. Make an appointment to follow-up with your record Dr. Return here for any new or worse symptoms. Take pain medication as needed. Based on today's exam do not feel that your abdominal pain is related to the gallstones.

## 2014-01-21 NOTE — ED Notes (Signed)
Pt requesting something to "make him go to sleep."

## 2014-01-21 NOTE — ED Provider Notes (Signed)
CSN: [id number]     Arrival date & time 01/21/14  0248 History   First MD Initiated Contact with Patient 01/21/14 0502     Chief Complaint  Patient presents with  . Flank Pain     (Consider location/radiation/quality/duration/timing/severity/associated sxs/prior Treatment) HPI Patient presents with 6 weeks of episodic left flank pain that radiates into the right abdomen. The pain is described as sharp. It woke patient up this evening. He's had nausea without vomiting. Also complains of constipation. Took milk of magnesia yesterday with watery bowel movement. Denies any fever or chills. No trauma, rash or urinary symptoms. Pain is worse with standing. Past Medical History  Diagnosis Date  . Dizziness   . Hypertension   . Hyperlipidemia   . History of tobacco abuse   . Chest pain    History reviewed. No pertinent past surgical history. Family History  Problem Relation Age of Onset  . Cancer Brother   . Cancer Sister    History  Substance Use Topics  . Smoking status: Former Smoker    Quit date: 02/01/2010  . Smokeless tobacco: Not on file  . Alcohol Use: No     Comment: quit 22 years ago     Review of Systems  Constitutional: Negative for fever and chills.  Respiratory: Negative for cough and shortness of breath.   Cardiovascular: Negative for chest pain, palpitations and leg swelling.  Gastrointestinal: Positive for nausea, abdominal pain, constipation and abdominal distention. Negative for vomiting, diarrhea and blood in stool.  Genitourinary: Negative for dysuria, frequency and hematuria.  Musculoskeletal: Positive for back pain. Negative for myalgias, neck pain and neck stiffness.  Skin: Negative for rash and wound.  Neurological: Negative for dizziness, weakness, light-headedness, numbness and headaches.  All other systems reviewed and are negative.     Allergies  Review of patient's allergies indicates no known allergies.  Home Medications   Prior to  Admission medications   Medication Sig Start Date End Date Taking? Authorizing Provider  ALPRAZolam (XANAX) 0.25 MG tablet Take 0.25 mg by mouth 2 (two) times daily as needed for anxiety.  12/20/12  Yes Historical Provider, MD  amLODipine (NORVASC) 10 MG tablet Take 10 mg by mouth daily.   Yes Historical Provider, MD  aspirin 81 MG tablet Take 81 mg by mouth daily.   Yes Historical Provider, MD  Calcium Carb-Cholecalciferol (CALCIUM 600 + D PO) Take 1 tablet by mouth daily.   Yes Historical Provider, MD  carboxymethylcellulose (REFRESH PLUS) 0.5 % SOLN Place 1 drop into both eyes 3 (three) times daily as needed (dry eyes).    Yes Historical Provider, MD  dexlansoprazole (DEXILANT) 60 MG capsule Take 60 mg by mouth daily.   Yes Historical Provider, MD  fluorometholone (FML) 0.1 % ophthalmic suspension Place 1 drop into the left eye 4 (four) times daily.   Yes Historical Provider, MD  hydrochlorothiazide (HYDRODIURIL) 12.5 MG tablet Take 12.5 mg by mouth daily.   Yes Historical Provider, MD  ibuprofen (ADVIL,MOTRIN) 200 MG tablet Take 200 mg by mouth every 6 (six) hours as needed for moderate pain.    Yes Historical Provider, MD  magnesium hydroxide (MILK OF MAGNESIA) 400 MG/5ML suspension Take 30 mLs by mouth daily as needed for mild constipation.   Yes Historical Provider, MD  meclizine (ANTIVERT) 25 MG tablet Take 25 mg by mouth 3 (three) times daily as needed for dizziness.   Yes Historical Provider, MD  metoprolol (LOPRESSOR) 50 MG tablet Take 50 mg by mouth daily.  12/11/12  Yes Historical Provider, MD  Olopatadine HCl (PATADAY) 0.2 % SOLN Place 1 drop into both eyes daily.   Yes Historical Provider, MD  potassium chloride (K-DUR,KLOR-CON) 10 MEQ tablet Take 10 mEq by mouth daily.   Yes Historical Provider, MD  promethazine (PHENERGAN) 25 MG tablet Take 25 mg by mouth every 6 (six) hours as needed for nausea.    Yes Historical Provider, MD  rosuvastatin (CRESTOR) 20 MG tablet Take 20 mg by mouth  daily.   Yes Historical Provider, MD  tamsulosin (FLOMAX) 0.4 MG CAPS capsule Take 0.4 mg by mouth daily after supper.   Yes Historical Provider, MD  tiZANidine (ZANAFLEX) 4 MG tablet Take 4 mg by mouth every 6 (six) hours as needed for muscle spasms.   Yes Historical Provider, MD  traMADol (ULTRAM) 50 MG tablet 50 mg every 12 (twelve) hours as needed for moderate pain.  01/27/13  Yes Historical Provider, MD  HYDROcodone-acetaminophen (NORCO/VICODIN) 5-325 MG per tablet Take 1 tablet by mouth every 6 (six) hours as needed for moderate pain. 01/21/14   Fredia Sorrow, MD  tamsulosin (FLOMAX) 0.4 MG CAPS capsule TAKE 1 CAPSULE AT BEDTIME Patient not taking: Reported on 01/21/2014 11/07/13   Lora Paula, MD  tamsulosin (FLOMAX) 0.4 MG CAPS capsule TAKE 1 CAPSULE AT BEDTIME 12/07/13   Lora Paula, MD   BP 154/98 mmHg  Pulse 77  Temp(Src) 97.9 F (36.6 C) (Oral)  Resp 20  Ht 5\' 7"  (1.702 m)  Wt 201 lb (91.173 kg)  BMI 31.47 kg/m2  SpO2 98% Physical Exam  Constitutional: He is oriented to person, place, and time. He appears well-developed and well-nourished. No distress.  HENT:  Head: Normocephalic and atraumatic.  Mouth/Throat: Oropharynx is clear and moist.  Eyes: EOM are normal. Pupils are equal, round, and reactive to light.  Neck: Normal range of motion. Neck supple.  Cardiovascular: Normal rate and regular rhythm.   Pulmonary/Chest: Effort normal and breath sounds normal. No respiratory distress. He has no wheezes. He has no rales. He exhibits no tenderness.  Abdominal: Soft. Bowel sounds are normal. He exhibits distension. He exhibits no mass. There is tenderness (mild left sided abdominal tenderness). There is no rebound and no guarding.  Musculoskeletal: Normal range of motion. He exhibits no edema or tenderness.  No midline thoracic or lumbar tenderness with palpation. No CVA tenderness bilaterally. Negative straight leg raise.  Neurological: He is alert and oriented to  person, place, and time.  Moves all extremities without deficit. Sensation is grossly intact.  Skin: Skin is warm and dry. No rash noted. No erythema.  Psychiatric: He has a normal mood and affect. His behavior is normal.  Nursing note and vitals reviewed.   ED Course  Procedures (including critical care time) Labs Review Labs Reviewed  CBC WITH DIFFERENTIAL - Abnormal; Notable for the following:    Neutrophils Relative % 80 (*)    Lymphocytes Relative 8 (*)    Lymphs Abs 0.5 (*)    All other components within normal limits  COMPREHENSIVE METABOLIC PANEL - Abnormal; Notable for the following:    Sodium 134 (*)    Potassium 3.6 (*)    Chloride 93 (*)    Glucose, Bld 165 (*)    Alkaline Phosphatase 118 (*)    GFR calc non Af Amer 56 (*)    GFR calc Af Amer 65 (*)    All other components within normal limits  URINALYSIS, ROUTINE W REFLEX MICROSCOPIC  I-STAT TROPOININ,  ED    Imaging Review Ct Abdomen Pelvis W Contrast  01/21/2014   CLINICAL DATA:  Abdominal pain for 6 weeks  EXAM: CT ABDOMEN AND PELVIS WITH CONTRAST  TECHNIQUE: Multidetector CT imaging of the abdomen and pelvis was performed using the standard protocol following bolus administration of intravenous contrast.  CONTRAST:  14mL OMNIPAQUE IOHEXOL 300 MG/ML  SOLN  COMPARISON:  07/03/2006  FINDINGS: The lung bases are free of acute infiltrate or sizable effusion.  The liver is diffusely fatty infiltrated. The spleen, adrenal glands and pancreas are normal in their CT appearance. The gallbladder is well distended and demonstrates multiple small gallstones. No wall thickening or pericholecystic fluid is noted. Kidneys are well visualized bilaterally and demonstrate some renal cystic change in the upper pole on the right. No renal calculi or obstructive changes are seen.  The appendix is not well visualized although no inflammatory changes to suggest appendicitis are noted. 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 The bladder is well distended. The prostate indents upon the inferior aspect of the bladder. Degenerative changes of the lumbar spine are seen. No pelvic mass lesion is noted. Aortoiliac calcifications are noted without aneurysmal dilatation.  IMPRESSION: Cholelithiasis without acute abnormality.  No acute abnormality is noted.   Electronically Signed   By: Inez Catalina M.D.   On: 01/21/2014 07:51     EKG Interpretation   Date/Time:  Saturday January 21 2014 05:54:57 EST Ventricular Rate:  67 PR Interval:  187 QRS Duration: 93 QT Interval:  411 QTC Calculation: 434 R Axis:   32 Text Interpretation:  Sinus rhythm Posterior infarct, old ST elevation,  consider inferior injury Confirmed by ZACKOWSKI  MD, [place] [phone]) on  01/21/2014 8:33:35 AM      MDM   Final diagnoses:  Abdominal pain   Patient signed out to oncoming emergency physician pending CT of the abdomen.     Julianne Rice, MD 01/22/14 [phone]

## 2014-01-21 NOTE — ED Provider Notes (Signed)
Results for orders placed or performed during the hospital encounter of 01/21/14  CBC with Differential  Result Value Ref Range   WBC 5.6 4.0 - 10.5 K/uL   RBC 4.83 4.22 - 5.81 MIL/uL   Hemoglobin 13.2 13.0 - 17.0 g/dL   HCT 40.1 39.0 - 52.0 %   MCV 83.0 78.0 - 100.0 fL   MCH 27.3 26.0 - 34.0 pg   MCHC 32.9 30.0 - 36.0 g/dL   RDW 14.0 11.5 - 15.5 %   Platelets 204 150 - 400 K/uL   Neutrophils Relative % 80 (H) 43 - 77 %   Neutro Abs 4.4 1.7 - 7.7 K/uL   Lymphocytes Relative 8 (L) 12 - 46 %   Lymphs Abs 0.5 (L) 0.7 - 4.0 K/uL   Monocytes Relative 11 3 - 12 %   Monocytes Absolute 0.6 0.1 - 1.0 K/uL   Eosinophils Relative 1 0 - 5 %   Eosinophils Absolute 0.1 0.0 - 0.7 K/uL   Basophils Relative 0 0 - 1 %   Basophils Absolute 0.0 0.0 - 0.1 K/uL  Comprehensive metabolic panel  Result Value Ref Range   Sodium 134 (L) 137 - 147 mEq/L   Potassium 3.6 (L) 3.7 - 5.3 mEq/L   Chloride 93 (L) 96 - 112 mEq/L   CO2 27 19 - 32 mEq/L   Glucose, Bld 165 (H) 70 - 99 mg/dL   BUN 21 6 - 23 mg/dL   Creatinine, Ser 1.23 0.50 - 1.35 mg/dL   Calcium 9.4 8.4 - 10.5 mg/dL   Total Protein 7.6 6.0 - 8.3 g/dL   Albumin 4.0 3.5 - 5.2 g/dL   AST 17 0 - 37 U/L   ALT 12 0 - 53 U/L   Alkaline Phosphatase 118 (H) 39 - 117 U/L   Total Bilirubin 0.4 0.3 - 1.2 mg/dL   GFR calc non Af Amer 56 (L) >90 mL/min   GFR calc Af Amer 65 (L) >90 mL/min   Anion gap 14 5 - 15  Urinalysis, Routine w reflex microscopic  Result Value Ref Range   Color, Urine YELLOW YELLOW   APPearance CLEAR CLEAR   Specific Gravity, Urine 1.018 1.005 - 1.030   pH 8.0 5.0 - 8.0   Glucose, UA NEGATIVE NEGATIVE mg/dL   Hgb urine dipstick NEGATIVE NEGATIVE   Bilirubin Urine NEGATIVE NEGATIVE   Ketones, ur NEGATIVE NEGATIVE mg/dL   Protein, ur NEGATIVE NEGATIVE mg/dL   Urobilinogen, UA 1.0 0.0 - 1.0 mg/dL   Nitrite NEGATIVE NEGATIVE   Leukocytes, UA NEGATIVE NEGATIVE  I-stat troponin, ED  Result Value Ref Range   Troponin i, poc 0.02  0.00 - 0.08 ng/mL   Comment 3           Ct Abdomen Pelvis W Contrast  01/21/2014   CLINICAL DATA:  Abdominal pain for 6 weeks  EXAM: CT ABDOMEN AND PELVIS WITH CONTRAST  TECHNIQUE: Multidetector CT imaging of the abdomen and pelvis was performed using the standard protocol following bolus administration of intravenous contrast.  CONTRAST:  113mL OMNIPAQUE IOHEXOL 300 MG/ML  SOLN  COMPARISON:  07/03/2006  FINDINGS: The lung bases are free of acute infiltrate or sizable effusion.  The liver is diffusely fatty infiltrated. The spleen, adrenal glands and pancreas are normal in their CT appearance. The gallbladder is well distended and demonstrates multiple small gallstones. No wall thickening or pericholecystic fluid is noted. Kidneys are well visualized bilaterally and demonstrate some renal cystic change in the upper pole on  the right. No renal calculi or obstructive changes are seen.  The appendix is not well visualized although no inflammatory changes to suggest appendicitis are noted. 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 The bladder is well distended. The prostate indents upon the inferior aspect of the bladder. Degenerative changes of the lumbar spine are seen. No pelvic mass lesion is noted. Aortoiliac calcifications are noted without aneurysmal dilatation.  IMPRESSION: Cholelithiasis without acute abnormality.  No acute abnormality is noted.   Electronically Signed   By: Inez Catalina M.D.   On: 01/21/2014 07:51    Patient presented for abdominal pain which seemed to be predominantly all over the abdomen. It's been present for several weeks. CT scan shows cholelithiasis. Based on labs and exam I do not think this is a symptomatic biliary colic. CT without any evidence of cholecystitis. Feel that his belly pain is not related. No other cause identified. Patient treated with pain medicine close follow-up with his doctor.  Fredia Sorrow, MD 01/21/14 [phone]

## 2014-01-21 NOTE — ED Notes (Addendum)
Pt presents with multiple complaints, pt reports bilateral lower side pain and dizziness when standing x6 weeks. Pt reports he is taking milk of mag with no relief. Pt also c/o abd pain starting tonight

## 2014-01-28 ENCOUNTER — Emergency Department (HOSPITAL_COMMUNITY): Payer: PRIVATE HEALTH INSURANCE

## 2014-01-28 ENCOUNTER — Encounter (HOSPITAL_COMMUNITY): Payer: Self-pay | Admitting: Emergency Medicine

## 2014-01-28 ENCOUNTER — Inpatient Hospital Stay (HOSPITAL_COMMUNITY)
Admission: EM | Admit: 2014-01-28 | Discharge: 2014-02-06 | DRG: 418 | Disposition: A | Discharge status: Home Health | Payer: PRIVATE HEALTH INSURANCE | Attending: Internal Medicine | Admitting: Internal Medicine

## 2014-01-28 DIAGNOSIS — K921 Melena: Secondary | ICD-10-CM | POA: Diagnosis not present

## 2014-01-28 DIAGNOSIS — K810 Acute cholecystitis: Secondary | ICD-10-CM | POA: Diagnosis present

## 2014-01-28 DIAGNOSIS — F0390 Unspecified dementia without behavioral disturbance: Secondary | ICD-10-CM | POA: Diagnosis present

## 2014-01-28 DIAGNOSIS — R1011 Right upper quadrant pain: Secondary | ICD-10-CM

## 2014-01-28 DIAGNOSIS — F419 Anxiety disorder, unspecified: Secondary | ICD-10-CM | POA: Diagnosis not present

## 2014-01-28 DIAGNOSIS — J90 Pleural effusion, not elsewhere classified: Secondary | ICD-10-CM | POA: Diagnosis not present

## 2014-01-28 DIAGNOSIS — Y839 Surgical procedure, unspecified as the cause of abnormal reaction of the patient, or of later complication, without mention of misadventure at the time of the procedure: Secondary | ICD-10-CM | POA: Diagnosis not present

## 2014-01-28 DIAGNOSIS — E876 Hypokalemia: Secondary | ICD-10-CM | POA: Diagnosis not present

## 2014-01-28 DIAGNOSIS — K567 Ileus, unspecified: Secondary | ICD-10-CM | POA: Insufficient documentation

## 2014-01-28 DIAGNOSIS — F39 Unspecified mood [affective] disorder: Secondary | ICD-10-CM | POA: Diagnosis present

## 2014-01-28 DIAGNOSIS — E669 Obesity, unspecified: Secondary | ICD-10-CM | POA: Diagnosis not present

## 2014-01-28 DIAGNOSIS — E785 Hyperlipidemia, unspecified: Secondary | ICD-10-CM

## 2014-01-28 DIAGNOSIS — Z6831 Body mass index (BMI) 31.0-31.9, adult: Secondary | ICD-10-CM | POA: Diagnosis not present

## 2014-01-28 DIAGNOSIS — K529 Noninfective gastroenteritis and colitis, unspecified: Secondary | ICD-10-CM | POA: Diagnosis not present

## 2014-01-28 DIAGNOSIS — K5900 Constipation, unspecified: Secondary | ICD-10-CM | POA: Diagnosis not present

## 2014-01-28 DIAGNOSIS — K219 Gastro-esophageal reflux disease without esophagitis: Secondary | ICD-10-CM | POA: Diagnosis not present

## 2014-01-28 DIAGNOSIS — Z8546 Personal history of malignant neoplasm of prostate: Secondary | ICD-10-CM

## 2014-01-28 DIAGNOSIS — N400 Enlarged prostate without lower urinary tract symptoms: Secondary | ICD-10-CM | POA: Diagnosis present

## 2014-01-28 DIAGNOSIS — I10 Essential (primary) hypertension: Secondary | ICD-10-CM | POA: Diagnosis not present

## 2014-01-28 DIAGNOSIS — Z79899 Other long term (current) drug therapy: Secondary | ICD-10-CM | POA: Diagnosis not present

## 2014-01-28 DIAGNOSIS — K8020 Calculus of gallbladder without cholecystitis without obstruction: Secondary | ICD-10-CM | POA: Diagnosis present

## 2014-01-28 DIAGNOSIS — R269 Unspecified abnormalities of gait and mobility: Secondary | ICD-10-CM | POA: Diagnosis not present

## 2014-01-28 DIAGNOSIS — Z7982 Long term (current) use of aspirin: Secondary | ICD-10-CM | POA: Diagnosis not present

## 2014-01-28 DIAGNOSIS — K913 Postprocedural intestinal obstruction: Secondary | ICD-10-CM | POA: Diagnosis not present

## 2014-01-28 DIAGNOSIS — K8000 Calculus of gallbladder with acute cholecystitis without obstruction: Principal | ICD-10-CM | POA: Diagnosis present

## 2014-01-28 DIAGNOSIS — R739 Hyperglycemia, unspecified: Secondary | ICD-10-CM

## 2014-01-28 DIAGNOSIS — E1165 Type 2 diabetes mellitus with hyperglycemia: Secondary | ICD-10-CM | POA: Diagnosis not present

## 2014-01-28 DIAGNOSIS — R109 Unspecified abdominal pain: Secondary | ICD-10-CM | POA: Diagnosis present

## 2014-01-28 DIAGNOSIS — G9340 Encephalopathy, unspecified: Secondary | ICD-10-CM

## 2014-01-28 DIAGNOSIS — F17210 Nicotine dependence, cigarettes, uncomplicated: Secondary | ICD-10-CM | POA: Diagnosis not present

## 2014-01-28 DIAGNOSIS — K819 Cholecystitis, unspecified: Secondary | ICD-10-CM

## 2014-01-28 DIAGNOSIS — R140 Abdominal distension (gaseous): Secondary | ICD-10-CM | POA: Insufficient documentation

## 2014-01-28 HISTORY — DX: Malignant (primary) neoplasm, unspecified: C80.1

## 2014-01-28 LAB — COMPREHENSIVE METABOLIC PANEL
ALT: 18 U/L (ref 0–53)
AST: 19 U/L (ref 0–37)
Albumin: 4.1 g/dL (ref 3.5–5.2)
Alkaline Phosphatase: 131 U/L — ABNORMAL HIGH (ref 39–117)
Anion gap: 16 — ABNORMAL HIGH (ref 5–15)
BUN: 14 mg/dL (ref 6–23)
CO2: 27 mEq/L (ref 19–32)
Calcium: 9.2 mg/dL (ref 8.4–10.5)
Chloride: 92 mEq/L — ABNORMAL LOW (ref 96–112)
Creatinine, Ser: 1.21 mg/dL (ref 0.50–1.35)
GFR calc Af Amer: 66 mL/min — ABNORMAL LOW (ref >90)
GFR calc non Af Amer: 57 mL/min — ABNORMAL LOW (ref >90)
Glucose, Bld: 276 mg/dL — ABNORMAL HIGH (ref 70–99)
Potassium: 4 mEq/L (ref 3.7–5.3)
Sodium: 135 mEq/L — ABNORMAL LOW (ref 137–147)
Total Bilirubin: 0.4 mg/dL (ref 0.3–1.2)
Total Protein: 7.7 g/dL (ref 6.0–8.3)

## 2014-01-28 LAB — URINALYSIS, ROUTINE W REFLEX MICROSCOPIC
Bilirubin Urine: NEGATIVE
Glucose, UA: 500 mg/dL — AB
Hgb urine dipstick: NEGATIVE
Ketones, ur: NEGATIVE mg/dL
Leukocytes, UA: NEGATIVE
Nitrite: NEGATIVE
Protein, ur: NEGATIVE mg/dL
Specific Gravity, Urine: 1.023 (ref 1.005–1.030)
Urobilinogen, UA: 0.2 mg/dL (ref 0.0–1.0)
pH: 8 (ref 5.0–8.0)

## 2014-01-28 LAB — CBG MONITORING, ED: Glucose-Capillary: 250 mg/dL — ABNORMAL HIGH (ref 70–99)

## 2014-01-28 LAB — CBC WITH DIFFERENTIAL/PLATELET
Basophils Absolute: 0 10*3/uL (ref 0.0–0.1)
Basophils Relative: 0 % (ref 0–1)
Eosinophils Absolute: 0 10*3/uL (ref 0.0–0.7)
Eosinophils Relative: 0 % (ref 0–5)
HCT: 41.3 % (ref 39.0–52.0)
Hemoglobin: 13.5 g/dL (ref 13.0–17.0)
Lymphocytes Relative: 9 % — ABNORMAL LOW (ref 12–46)
Lymphs Abs: 0.8 10*3/uL (ref 0.7–4.0)
MCH: 27.3 pg (ref 26.0–34.0)
MCHC: 32.7 g/dL (ref 30.0–36.0)
MCV: 83.6 fL (ref 78.0–100.0)
Monocytes Absolute: 0.7 10*3/uL (ref 0.1–1.0)
Monocytes Relative: 8 % (ref 3–12)
Neutro Abs: 7.2 10*3/uL (ref 1.7–7.7)
Neutrophils Relative %: 83 % — ABNORMAL HIGH (ref 43–77)
Platelets: 190 10*3/uL (ref 150–400)
RBC: 4.94 MIL/uL (ref 4.22–5.81)
RDW: 14 % (ref 11.5–15.5)
WBC: 8.6 10*3/uL (ref 4.0–10.5)

## 2014-01-28 LAB — GLUCOSE, CAPILLARY
Glucose-Capillary: 208 mg/dL — ABNORMAL HIGH (ref 70–99)
Glucose-Capillary: 139 mg/dL — ABNORMAL HIGH (ref 70–99)
Glucose-Capillary: 127 mg/dL — ABNORMAL HIGH (ref 70–99)

## 2014-01-28 LAB — SURGICAL PCR SCREEN
MRSA, PCR: NEGATIVE
Staphylococcus aureus: NEGATIVE

## 2014-01-28 LAB — LIPASE, BLOOD: Lipase: 18 U/L (ref 11–59)

## 2014-01-28 LAB — I-STAT CG4 LACTIC ACID, ED: Lactic Acid, Venous: 2.04 mmol/L (ref 0.5–2.2)

## 2014-01-28 LAB — I-STAT TROPONIN, ED: Troponin i, poc: 0.01 ng/mL (ref 0.00–0.08)

## 2014-01-28 MED ORDER — HEPARIN SODIUM (PORCINE) 5000 UNIT/ML IJ SOLN
5000.0000 [IU] | Freq: Three times a day (TID) | INTRAMUSCULAR | Status: DC
  Administered 2014-01-28 – 2014-01-30 (×6): 5000 [IU] via SUBCUTANEOUS
  Filled 2014-01-28 (×7): qty 1

## 2014-01-28 MED ORDER — AMLODIPINE BESYLATE 10 MG PO TABS
10.0000 mg | ORAL_TABLET | Freq: Every day | ORAL | Status: DC
  Administered 2014-01-28 – 2014-02-06 (×9): 10 mg via ORAL
  Filled 2014-01-28 (×10): qty 1
  Filled 2014-01-28: qty 2

## 2014-01-28 MED ORDER — PANTOPRAZOLE SODIUM 40 MG PO TBEC
40.0000 mg | DELAYED_RELEASE_TABLET | Freq: Every day | ORAL | Status: DC
  Administered 2014-01-28 – 2014-02-06 (×9): 40 mg via ORAL
  Filled 2014-01-28 (×9): qty 1

## 2014-01-28 MED ORDER — CARBOXYMETHYLCELLULOSE SODIUM 0.5 % OP SOLN: 1.0000 [drp] | Freq: Three times a day (TID) | OPHTHALMIC | Status: DC | PRN

## 2014-01-28 MED ORDER — POLYVINYL ALCOHOL 1.4 % OP SOLN
1.0000 [drp] | Freq: Three times a day (TID) | OPHTHALMIC | Status: DC | PRN
  Filled 2014-01-28: qty 15

## 2014-01-28 MED ORDER — HYDROMORPHONE HCL 1 MG/ML IJ SOLN: 1.0000 mg | INTRAMUSCULAR | Status: DC | PRN

## 2014-01-28 MED ORDER — SODIUM CHLORIDE 0.9 % IV BOLUS (SEPSIS)
1000.0000 mL | Freq: Once | INTRAVENOUS | Status: AC
  Administered 2014-01-28: 1000 mL via INTRAVENOUS

## 2014-01-28 MED ORDER — ACETAMINOPHEN 650 MG RE SUPP
650.0000 mg | Freq: Four times a day (QID) | RECTAL | Status: DC | PRN
  Filled 2014-01-28 (×2): qty 1

## 2014-01-28 MED ORDER — PIPERACILLIN-TAZOBACTAM 3.375 G IVPB 30 MIN
3.3750 g | Freq: Once | INTRAVENOUS | Status: AC
  Administered 2014-01-28: 3.375 g via INTRAVENOUS
  Filled 2014-01-28: qty 50

## 2014-01-28 MED ORDER — PNEUMOCOCCAL VAC POLYVALENT 25 MCG/0.5ML IJ INJ
0.5000 mL | INJECTION | INTRAMUSCULAR | Status: AC
  Administered 2014-01-29: 0.5 mL via INTRAMUSCULAR
  Filled 2014-01-28: qty 0.5

## 2014-01-28 MED ORDER — ALUM & MAG HYDROXIDE-SIMETH 200-200-20 MG/5ML PO SUSP
30.0000 mL | Freq: Four times a day (QID) | ORAL | Status: DC | PRN
  Administered 2014-02-02: 30 mL via ORAL
  Filled 2014-01-28: qty 30

## 2014-01-28 MED ORDER — TAMSULOSIN HCL 0.4 MG PO CAPS
0.4000 mg | ORAL_CAPSULE | Freq: Every day | ORAL | Status: DC
  Administered 2014-01-28 – 2014-02-05 (×9): 0.4 mg via ORAL
  Filled 2014-01-28 (×10): qty 1

## 2014-01-28 MED ORDER — TIZANIDINE HCL 4 MG PO TABS
4.0000 mg | ORAL_TABLET | Freq: Four times a day (QID) | ORAL | Status: DC | PRN
  Filled 2014-01-28: qty 1

## 2014-01-28 MED ORDER — INSULIN ASPART 100 UNIT/ML ~~LOC~~ SOLN
0.0000 [IU] | SUBCUTANEOUS | Status: DC
  Administered 2014-01-28 (×2): 1 [IU] via SUBCUTANEOUS
  Administered 2014-01-28 – 2014-01-29 (×2): 3 [IU] via SUBCUTANEOUS
  Administered 2014-01-29 (×2): 2 [IU] via SUBCUTANEOUS
  Administered 2014-01-29 (×3): 1 [IU] via SUBCUTANEOUS
  Administered 2014-01-30: 2 [IU] via SUBCUTANEOUS
  Administered 2014-01-30: 1 [IU] via SUBCUTANEOUS
  Administered 2014-01-30 – 2014-01-31 (×3): 2 [IU] via SUBCUTANEOUS
  Administered 2014-01-31: 1 [IU] via SUBCUTANEOUS

## 2014-01-28 MED ORDER — ONDANSETRON HCL 4 MG PO TABS: 4.0000 mg | ORAL_TABLET | Freq: Four times a day (QID) | ORAL | Status: DC | PRN

## 2014-01-28 MED ORDER — IOHEXOL 350 MG/ML SOLN
100.0000 mL | Freq: Once | INTRAVENOUS | Status: AC | PRN
  Administered 2014-01-28: 100 mL via INTRAVENOUS

## 2014-01-28 MED ORDER — ACETAMINOPHEN 325 MG PO TABS
650.0000 mg | ORAL_TABLET | Freq: Four times a day (QID) | ORAL | Status: DC | PRN
  Administered 2014-01-30 – 2014-02-06 (×11): 650 mg via ORAL
  Filled 2014-01-28 (×11): qty 2

## 2014-01-28 MED ORDER — OLOPATADINE HCL 0.1 % OP SOLN
1.0000 [drp] | Freq: Every day | OPHTHALMIC | Status: DC
  Administered 2014-01-28 – 2014-02-06 (×9): 1 [drp] via OPHTHALMIC
  Filled 2014-01-28: qty 5

## 2014-01-28 MED ORDER — CEFTRIAXONE SODIUM IN DEXTROSE 40 MG/ML IV SOLN
2.0000 g | INTRAVENOUS | Status: DC
  Administered 2014-01-28 – 2014-02-04 (×9): 2 g via INTRAVENOUS
  Filled 2014-01-28 (×10): qty 50

## 2014-01-28 MED ORDER — ASPIRIN 81 MG PO CHEW
81.0000 mg | CHEWABLE_TABLET | Freq: Every day | ORAL | Status: DC
  Administered 2014-01-28 – 2014-02-03 (×6): 81 mg via ORAL
  Filled 2014-01-28 (×7): qty 1

## 2014-01-28 MED ORDER — HYDROCODONE-ACETAMINOPHEN 5-325 MG PO TABS
1.0000 | ORAL_TABLET | ORAL | Status: DC | PRN
  Administered 2014-01-28 (×2): 2 via ORAL
  Administered 2014-01-29 – 2014-01-31 (×4): 1 via ORAL
  Filled 2014-01-28 (×3): qty 1
  Filled 2014-01-28 (×2): qty 2
  Filled 2014-01-28: qty 1

## 2014-01-28 MED ORDER — ASPIRIN 81 MG PO TABS: 81.0000 mg | ORAL_TABLET | Freq: Every day | ORAL | Status: DC

## 2014-01-28 MED ORDER — ALPRAZOLAM 0.25 MG PO TABS
0.2500 mg | ORAL_TABLET | Freq: Two times a day (BID) | ORAL | Status: DC | PRN
  Administered 2014-01-31 – 2014-02-05 (×5): 0.25 mg via ORAL
  Filled 2014-01-28 (×5): qty 1

## 2014-01-28 MED ORDER — HYDROMORPHONE HCL 1 MG/ML IJ SOLN
1.0000 mg | Freq: Once | INTRAMUSCULAR | Status: AC
  Administered 2014-01-28: 1 mg via INTRAVENOUS
  Filled 2014-01-28: qty 1

## 2014-01-28 MED ORDER — METOPROLOL TARTRATE 50 MG PO TABS
50.0000 mg | ORAL_TABLET | Freq: Every day | ORAL | Status: DC
  Administered 2014-01-28 – 2014-02-06 (×9): 50 mg via ORAL
  Filled 2014-01-28 (×10): qty 1

## 2014-01-28 MED ORDER — ONDANSETRON HCL 4 MG/2ML IJ SOLN
4.0000 mg | Freq: Four times a day (QID) | INTRAMUSCULAR | Status: DC | PRN
  Administered 2014-01-28 – 2014-02-04 (×4): 4 mg via INTRAVENOUS
  Filled 2014-01-28 (×4): qty 2

## 2014-01-28 MED ORDER — MORPHINE SULFATE 4 MG/ML IJ SOLN
4.0000 mg | Freq: Once | INTRAMUSCULAR | Status: AC
  Administered 2014-01-28: 4 mg via INTRAVENOUS
  Filled 2014-01-28: qty 1

## 2014-01-28 MED ORDER — FLUOROMETHOLONE 0.1 % OP SUSP
1.0000 [drp] | Freq: Four times a day (QID) | OPHTHALMIC | Status: DC
  Administered 2014-01-28 – 2014-02-06 (×37): 1 [drp] via OPHTHALMIC
  Filled 2014-01-28: qty 5

## 2014-01-28 MED ORDER — SODIUM CHLORIDE 0.9 % IV SOLN
INTRAVENOUS | Status: DC
  Administered 2014-01-28 – 2014-01-31 (×6): via INTRAVENOUS

## 2014-01-28 MED ORDER — MECLIZINE HCL 25 MG PO TABS
25.0000 mg | ORAL_TABLET | Freq: Three times a day (TID) | ORAL | Status: DC | PRN
  Administered 2014-01-31: 25 mg via ORAL
  Filled 2014-01-28 (×2): qty 1

## 2014-01-28 NOTE — ED Notes (Addendum)
An initial EKG was done before 02:28 on 01/28/14 and given to Dr. Darl Householder and submitted on the electronic chart. The date and time on the initial EKG was incorrect. Another EKG was performed and given to Dr. Darl Householder, which contained the correct date and time.

## 2014-01-28 NOTE — Progress Notes (Signed)
Patient ID: Peter Mann, male   DOB: 22-Jun-1940, 73 y.o.   MRN: [id number] Will put prophylactically on call Rocephin and order a HIDA scan to better evaluate his abdominal pain.  Soliana Kitko E 1:53 PM 01/28/2014

## 2014-01-28 NOTE — ED Notes (Signed)
Pt. reports generalized abdominal pain with nausea after eating cake this evening , denies diarrhea , no fever or chills.

## 2014-01-28 NOTE — H&P (Signed)
History and Physical       Hospital Admission Note Date: 01/28/2014  Patient name: Peter Mann Medical record number: [id number] Date of birth: 12-Feb-1941 Age: 73 y.o. Gender: male  PCP: Gaynelle Arabian, MD    Chief Complaint:  Abdominal pain.  HPI: Patient is a 73 year old male with history of hypertension, hyperlipidemia, vertigo, BPH presented to ED with abdominal pain. Patient reported that he has been having the abdominal pain for last 8 weeks, worsened in the last 1 week. He states pain is diffuse, 10/10, sharp and constant. Patient was seen in ED on 11/14 for abdominal pain as well, CT scan at that time showed cholelithiasis however was not felt to be symptomatic biliary colic and no evidence of cholecystitis. Patient was treated with tramadol and recommended to follow up with his PCP. Patient denies any vomiting, however states he has nausea, denies any diarrhea or constipation or chest pain, diaphoresis. Ultrasound showed cholelithiasis with borderline thickened gallbladder wall, CT showed possible gangrenous cholecystitis but no mesenteric ischemia. General surgery was consulted, recommended admission to medicine service.   Review of Systems:  Constitutional: Denies fever, chills, diaphoresis, poor appetite and fatigue.  HEENT: Denies photophobia, eye pain, redness, hearing loss, ear pain, congestion, sore throat, rhinorrhea, sneezing, mouth sores, trouble swallowing, neck pain, neck stiffness and tinnitus.   Respiratory: Denies SOB, DOE, cough, chest tightness,  and wheezing.   Cardiovascular: Denies chest pain, palpitations and leg swelling.  Gastrointestinal: Please see history of present illness Genitourinary: Denies dysuria, urgency, frequency, hematuria, flank pain and difficulty urinating.  Musculoskeletal: Denies myalgias, back pain, joint swelling, arthralgias and gait problem.  Skin: Denies pallor, rash and  wound.  Neurological: Denies dizziness, seizures, syncope, weakness, light-headedness, numbness and headaches. + occasionally has vertigo Hematological: Denies adenopathy. Easy bruising, personal or family bleeding history  Psychiatric/Behavioral: Denies suicidal ideation, mood changes, confusion, nervousness, sleep disturbance and agitation  Past Medical History: Past Medical History  Diagnosis Date  . Dizziness   . Hypertension   . Hyperlipidemia   . History of tobacco abuse   . Chest pain    History reviewed. No pertinent past surgical history.  Medications: Prior to Admission medications   Medication Sig Start Date End Date Taking? Authorizing Provider  ALPRAZolam (XANAX) 0.25 MG tablet Take 0.25 mg by mouth 2 (two) times daily as needed for anxiety.  12/20/12  Yes Historical Provider, MD  amLODipine (NORVASC) 10 MG tablet Take 10 mg by mouth daily.   Yes Historical Provider, MD  aspirin 81 MG tablet Take 81 mg by mouth daily.   Yes Historical Provider, MD  Calcium Carb-Cholecalciferol (CALCIUM 600 + D PO) Take 1 tablet by mouth daily.   Yes Historical Provider, MD  carboxymethylcellulose (REFRESH PLUS) 0.5 % SOLN Place 1 drop into both eyes 3 (three) times daily as needed (dry eyes).    Yes Historical Provider, MD  dexlansoprazole (DEXILANT) 60 MG capsule Take 60 mg by mouth daily.   Yes Historical Provider, MD  fluorometholone (FML) 0.1 % ophthalmic suspension Place 1 drop into the left eye 4 (four) times daily.   Yes Historical Provider, MD  hydrochlorothiazide (HYDRODIURIL) 12.5 MG tablet Take 12.5 mg by mouth daily.   Yes Historical Provider, MD  HYDROcodone-acetaminophen (NORCO/VICODIN) 5-325 MG per tablet Take 1 tablet by mouth every 6 (six) hours as needed for moderate pain. 01/21/14  Yes Fredia Sorrow, MD  ibuprofen (ADVIL,MOTRIN) 200 MG tablet Take 200 mg by mouth every 6 (six) hours as needed for moderate pain.  Yes Historical Provider, MD  magnesium hydroxide (MILK OF  MAGNESIA) 400 MG/5ML suspension Take 30 mLs by mouth daily as needed for mild constipation.   Yes Historical Provider, MD  meclizine (ANTIVERT) 25 MG tablet Take 25 mg by mouth 3 (three) times daily as needed for dizziness.   Yes Historical Provider, MD  metoprolol (LOPRESSOR) 50 MG tablet Take 50 mg by mouth daily.  12/11/12  Yes Historical Provider, MD  Olopatadine HCl (PATADAY) 0.2 % SOLN Place 1 drop into both eyes daily.   Yes Historical Provider, MD  potassium chloride (K-DUR,KLOR-CON) 10 MEQ tablet Take 10 mEq by mouth daily.   Yes Historical Provider, MD  promethazine (PHENERGAN) 25 MG tablet Take 25 mg by mouth every 6 (six) hours as needed for nausea.    Yes Historical Provider, MD  rosuvastatin (CRESTOR) 20 MG tablet Take 20 mg by mouth daily.   Yes Historical Provider, MD  tamsulosin (FLOMAX) 0.4 MG CAPS capsule Take 0.4 mg by mouth daily after supper.   Yes Historical Provider, MD  tiZANidine (ZANAFLEX) 4 MG tablet Take 4 mg by mouth every 6 (six) hours as needed for muscle spasms.   Yes Historical Provider, MD  traMADol (ULTRAM) 50 MG tablet Take 50 mg by mouth every 12 (twelve) hours as needed for moderate pain.  01/27/13  Yes Historical Provider, MD  tamsulosin (FLOMAX) 0.4 MG CAPS capsule TAKE 1 CAPSULE AT BEDTIME Patient not taking: Reported on 01/21/2014 11/07/13   Lora Paula, MD  tamsulosin (FLOMAX) 0.4 MG CAPS capsule TAKE 1 CAPSULE AT BEDTIME 12/07/13   Lora Paula, MD    Allergies:  No Known Allergies  Social History:  reports that he has been smoking.  He does not have any smokeless tobacco history on file. He reports that he does not drink alcohol or use illicit drugs.  Family History: Family History  Problem Relation Age of Onset  . Cancer Brother   . Cancer Sister     Physical Exam: Blood pressure 163/70, pulse 75, temperature 97.5 F (36.4 C), temperature source Oral, resp. rate 22, height 5\' 7"  (1.702 m), weight 91.173 kg (201 lb), SpO2 95  %. General: Alert, awake, oriented x3, in no acute distress. HEENT: normocephalic, atraumatic, anicteric sclera, pink conjunctiva, pupils equal and reactive to light and accomodation, oropharynx clear Neck: supple, no masses or lymphadenopathy, no goiter, no bruits  Heart: Regular rate and rhythm, without murmurs, rubs or gallops. Lungs: Clear to auscultation bilaterally, no wheezing, rales or rhonchi. Abdomen: Obese, Soft, minimal tenderness on deep palpation, nondistended, positive bowel sounds Extremities: No clubbing, cyanosis or edema with positive pedal pulses. Neuro: Grossly intact, no focal neurological deficits, strength 5/5 upper and lower extremities bilaterally Psych: alert and oriented x 3, normal mood and affect Skin: no rashes or lesions, warm and dry   LABS on Admission:  Basic Metabolic Panel:  Recent Labs Lab 01/28/14 0211  NA 135*  K 4.0  CL 92*  CO2 27  GLUCOSE 276*  BUN 14  CREATININE 1.21  CALCIUM 9.2   Liver Function Tests:  Recent Labs Lab 01/28/14 0211  AST 19  ALT 18  ALKPHOS 131*  BILITOT 0.4  PROT 7.7  ALBUMIN 4.1    Recent Labs Lab 01/28/14 0211  LIPASE 18   No results for input(s): AMMONIA in the last 168 hours. CBC:  Recent Labs Lab 01/28/14 0211  WBC 8.6  NEUTROABS 7.2  HGB 13.5  HCT 41.3  MCV 83.6  PLT 190  Cardiac Enzymes: No results for input(s): CKTOTAL, CKMB, CKMBINDEX, TROPONINI in the last 168 hours. BNP: Invalid input(s): POCBNP CBG:  Recent Labs Lab 01/28/14 0539  GLUCAP 250*     Radiological Exams on Admission: Ct Abdomen Pelvis W Contrast  01/21/2014   CLINICAL DATA:  Abdominal pain for 6 weeks  EXAM: CT ABDOMEN AND PELVIS WITH CONTRAST  TECHNIQUE: Multidetector CT imaging of the abdomen and pelvis was performed using the standard protocol following bolus administration of intravenous contrast.  CONTRAST:  138mL OMNIPAQUE IOHEXOL 300 MG/ML  SOLN  COMPARISON:  07/03/2006  FINDINGS: The lung bases are  free of acute infiltrate or sizable effusion.  The liver is diffusely fatty infiltrated. The spleen, adrenal glands and pancreas are normal in their CT appearance. The gallbladder is well distended and demonstrates multiple small gallstones. No wall thickening or pericholecystic fluid is noted. Kidneys are well visualized bilaterally and demonstrate some renal cystic change in the upper pole on the right. No renal calculi or obstructive changes are seen.  The appendix is not well visualized although no inflammatory changes to suggest appendicitis are noted. 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 The bladder is well distended. The prostate indents upon the inferior aspect of the bladder. Degenerative changes of the lumbar spine are seen. No pelvic mass lesion is noted. Aortoiliac calcifications are noted without aneurysmal dilatation.  IMPRESSION: Cholelithiasis without acute abnormality.  No acute abnormality is noted.   Electronically Signed   By: Inez Catalina M.D.   On: 01/21/2014 07:51   US Abdomen Limited  01/28/2014   CLINICAL DATA:  Initial evaluation for right upper quadrant pain.  EXAM: US ABDOMEN LIMITED - RIGHT UPPER QUADRANT  COMPARISON:  Prior CT from 01/21/2014  FINDINGS: Gallbladder:  Multiple echogenic shadowing stones present within the gallbladder lumen measuring up to 1.3 cm in diameter. Several of these appeared adherent to the gallbladder wall. Probable gallbladder sludge present as well. Gallbladder wall minimally thickened to 4 mm. No free pericholecystic fluid. No sonographic Murphy sign elicited on exam.  Common bile duct:  Diameter: 7.6 mm, within normal limits for patient age. No intrahepatic biliary dilatation.  Liver:  No focal lesion identified. Within normal limits in parenchymal echogenicity.  IMPRESSION: 1. Stones with sludge within the gallbladder lumen with minimal gallbladder wall thickening. Several of the stones appeared adherent to the  gallbladder wall. No overt sonographic evidence for acute cholecystitis. 2. No biliary dilatation identified.   Electronically Signed   By: Jeannine Boga M.D.   On: 01/28/2014 03:58   Ct Angio Abd/pel W/ And/or W/o  01/28/2014   CLINICAL DATA:  In the extreme abdominal pain and abdominal distention. Nausea. Generalized abdominal pain.  EXAM: CTA ABDOMEN AND PELVIS wITHOUT AND WITH CONTRAST  TECHNIQUE: Multidetector CT imaging of the abdomen and pelvis was performed using the standard protocol during bolus administration of intravenous contrast. Multiplanar reconstructed images and MIPs were obtained and reviewed to evaluate the vascular anatomy.  CONTRAST:  111mL OMNIPAQUE IOHEXOL 350 MG/ML SOLN  COMPARISON:  Ultrasound abdomen 01/28/2014. CT abdomen and pelvis 01/21/2014.  FINDINGS: CT images obtained during the angiographic phase after contrast administration demonstrate diffuse calcification and mild mural thrombus/atherosclerotic changes in the abdominal aorta. No evidence of aneurysm or dissection. The abdominal aorta, celiac axis, superior mesenteric artery, single bilateral renal arteries, inferior mesenteric artery, and bilateral common iliac, external iliac, internal iliac, and common femoral arteries are patent.  Mild dependent changes in the lung bases. Small pericardial  effusion or thickening. Thickening of the wall of the distal esophagus may indicate reflux esophagitis.  Cholelithiasis with gas in the gallbladder and mild gallbladder wall thickening. Appearance is suspicious for gangrenous cholecystitis. No bile duct dilatation. The liver, spleen, pancreas, adrenal glands, kidneys, inferior vena cava, and retroperitoneal lymph nodes appear unremarkable. Stomach is mildly distended with a fluid. No gastric wall thickening. Small bowel are decompressed. Diffusely stool-filled colon without distention. No free air or free fluid in the abdomen.  Pelvis: Prostate gland is enlarged. Bladder wall  is mildly thickened which may be due to bladder wall hypertrophy or infection. Correlate clinically for signs of cystitis. Diverticulosis of the sigmoid colon. No evidence of diverticulitis. The appendix is normal. No free or loculated pelvic fluid collections. No pelvic mass or lymphadenopathy. Degenerative changes in the lumbar spine. No destructive bone lesions appreciated.  Review of the MIP images confirms the above findings.  IMPRESSION: No evidence of abdominal aortic aneurysm or dissection. Abdominal aorta and major branch vessels are patent.  Cholelithiasis with gas in the gallbladder and mild gallbladder wall thickening worrisome for gangrenous cholecystitis.   Electronically Signed   By: Lucienne Capers M.D.   On: 01/28/2014 04:58    Assessment/Plan Principal Problem:   Abdominal pain ? Symptomatic cholelithiasis / acute cholecystitis ? Gangrenous, lipase normal, LFTs normal  -  admit to MedSurg, placed on NPO status for now, await surgery recommendations  - Continue IV fluids, pain control and antiemetics   Active Problems:   Essential hypertension - Currently stable, resume outpatient antihypertensives  Hyperlipidemia - Hold statins for now    Hyperglycemia - Patient has a history of diabetes however not been treated - obtain hemoglobin A1c, place on sliding scale insulin - he will need diabetic education, diabetic coordinator consult prior to DC   DVT prophylaxis: Heparin subcutaneous   CODE STATUS: Full code   Family Communication: Admission, patients condition and plan of care including tests being ordered have been discussed with the patient who indicates understanding and agree with the plan and Code Status   Further plan will depend as patient's clinical course evolves and further radiologic and laboratory data become available.   Time Spent on Admission: 55 mins  RAI,RIPUDEEP M.D. [facility] 01/28/2014, 9:44 AM Pager: [phone]  If 7PM-7AM, please  contact night-coverage [email or web address] Password [email or web address]

## 2014-01-28 NOTE — ED Notes (Signed)
Informed pt that we need a urine sample.

## 2014-01-28 NOTE — Consult Note (Signed)
Peter Mann 1940-10-01  [id number].   Primary Care MD: Dr. Nolene Ebbs Requesting MD: Dr. Darl Householder Chief Complaint/Reason for Consult: cholelithiasis HPI: This is a 73 yo black male who is muttering a lot and difficult to understand.  He has a history of HTN, hyperlipidemia, and DM.  He has not been treated for DM in 3 years, but his blood sugars here are 276.  He thinks about 8 or 12 weeks ago he had some abdominal pain and he was put on a medicine for it.  He is unable to tell me what the medicine is or what exactly it is for.  He states last night he ate some cake and then developed diffuse abdominal pain after this.  He is unable to describe it any better for me.  It does not hurt in one place more than the other.  He has some nausea, but no emesis.  He presented to the [facility] where he had an abdominal US that revealed cholelithiasis, but no evidence of cholecystitis.  He then had a CT angio of his abd/pelv that once again showed cholelithiasis and a small amount of air inside the actual gallbladder itself, but no other findings of cholecystitis.  All of his vessels were patent as well.  His WBC is normal and LFTs are all normal except alkph of 131.  We have been asked to evaluate him for his abdominal pain.  ROS: Please see HPI, otherwise negative  Family History  Problem Relation Age of Onset  . Cancer Brother   . Cancer Sister     Past Medical History  Diagnosis Date  . Dizziness   . Hypertension   . Hyperlipidemia   . History of tobacco abuse   . Chest pain     History reviewed. No pertinent past surgical history.  Social History:  reports that he has been smoking.  He does not have any smokeless tobacco history on file. He reports that he does not drink alcohol or use illicit drugs.  He has a 100-pack year history of smoking, quit in 2011 and intermittently smokes now.  Allergies: No Known Allergies   (Not in a hospital admission)  Blood pressure 163/70, pulse 75, temperature  97.5 F (36.4 C), temperature source Oral, resp. rate 22, SpO2 95 %. Physical Exam: General: obese somewhat disheveled appearing black male who is laying in bed in NAD HEENT: head is normocephalic, atraumatic.  Sclera are noninjected. Pupils are pinpoint  Ears and nose without any masses or lesions.  Mouth is pink  Heart: regular, rate, and rhythm.  Normal s1,s2. No obvious murmurs, gallops, or rubs noted.  Palpable radial and pedal pulses bilaterally Lungs: CTAB, no wheezes, rhonchi, or rales noted.  Respiratory effort nonlabored Abd: soft, NT, ND, +BS, no masses, hernias, or organomegaly, obese MS: all 4 extremities are symmetrical with no cyanosis, clubbing, or edema. Skin: warm and dry with no masses, lesions, or rashes Psych: A&Ox3.    Results for orders placed or performed during the hospital encounter of 01/28/14 (from the past 48 hour(s))  CBC with Differential     Status: Abnormal   Collection Time: 01/28/14  2:11 AM  Result Value Ref Range   WBC 8.6 4.0 - 10.5 K/uL   RBC 4.94 4.22 - 5.81 MIL/uL   Hemoglobin 13.5 13.0 - 17.0 g/dL   HCT 41.3 39.0 - 52.0 %   MCV 83.6 78.0 - 100.0 fL   MCH 27.3 26.0 - 34.0 pg   MCHC 32.7 30.0 -  36.0 g/dL   RDW 14.0 11.5 - 15.5 %   Platelets 190 150 - 400 K/uL   Neutrophils Relative % 83 (H) 43 - 77 %   Neutro Abs 7.2 1.7 - 7.7 K/uL   Lymphocytes Relative 9 (L) 12 - 46 %   Lymphs Abs 0.8 0.7 - 4.0 K/uL   Monocytes Relative 8 3 - 12 %   Monocytes Absolute 0.7 0.1 - 1.0 K/uL   Eosinophils Relative 0 0 - 5 %   Eosinophils Absolute 0.0 0.0 - 0.7 K/uL   Basophils Relative 0 0 - 1 %   Basophils Absolute 0.0 0.0 - 0.1 K/uL  Comprehensive metabolic panel     Status: Abnormal   Collection Time: 01/28/14  2:11 AM  Result Value Ref Range   Sodium 135 (L) 137 - 147 mEq/L   Potassium 4.0 3.7 - 5.3 mEq/L   Chloride 92 (L) 96 - 112 mEq/L   CO2 27 19 - 32 mEq/L   Glucose, Bld 276 (H) 70 - 99 mg/dL   BUN 14 6 - 23 mg/dL   Creatinine, Ser 1.21 0.50 -  1.35 mg/dL   Calcium 9.2 8.4 - 10.5 mg/dL   Total Protein 7.7 6.0 - 8.3 g/dL   Albumin 4.1 3.5 - 5.2 g/dL   AST 19 0 - 37 U/L   ALT 18 0 - 53 U/L   Alkaline Phosphatase 131 (H) 39 - 117 U/L   Total Bilirubin 0.4 0.3 - 1.2 mg/dL   GFR calc non Af Amer 57 (L) >90 mL/min   GFR calc Af Amer 66 (L) >90 mL/min    Comment: (NOTE) The eGFR has been calculated using the CKD EPI equation. This calculation has not been validated in all clinical situations. eGFR's persistently <90 mL/min signify possible Chronic Kidney Disease.    Anion gap 16 (H) 5 - 15  Lipase, blood     Status: None   Collection Time: 01/28/14  2:11 AM  Result Value Ref Range   Lipase 18 11 - 59 U/L  I-Stat CG4 Lactic Acid, ED     Status: None   Collection Time: 01/28/14  2:27 AM  Result Value Ref Range   Lactic Acid, Venous 2.04 0.5 - 2.2 mmol/L  I-stat troponin, ED     Status: None   Collection Time: 01/28/14  3:18 AM  Result Value Ref Range   Troponin i, poc 0.01 0.00 - 0.08 ng/mL   Comment 3            Comment: Due to the release kinetics of cTnI, a negative result within the first hours of the onset of symptoms does not rule out myocardial infarction with certainty. If myocardial infarction is still suspected, repeat the test at appropriate intervals.   Urinalysis, Routine w reflex microscopic     Status: Abnormal   Collection Time: 01/28/14  4:23 AM  Result Value Ref Range   Color, Urine YELLOW YELLOW   APPearance CLEAR CLEAR   Specific Gravity, Urine 1.023 1.005 - 1.030   pH 8.0 5.0 - 8.0   Glucose, UA 500 (A) NEGATIVE mg/dL   Hgb urine dipstick NEGATIVE NEGATIVE   Bilirubin Urine NEGATIVE NEGATIVE   Ketones, ur NEGATIVE NEGATIVE mg/dL   Protein, ur NEGATIVE NEGATIVE mg/dL   Urobilinogen, UA 0.2 0.0 - 1.0 mg/dL   Nitrite NEGATIVE NEGATIVE   Leukocytes, UA NEGATIVE NEGATIVE    Comment: MICROSCOPIC NOT DONE ON URINES WITH NEGATIVE PROTEIN, BLOOD, LEUKOCYTES, NITRITE, OR GLUCOSE <1000  mg/dL.  CBG  monitoring, ED     Status: Abnormal   Collection Time: 01/28/14  5:39 AM  Result Value Ref Range   Glucose-Capillary 250 (H) 70 - 99 mg/dL   Comment 1 Notify RN    Comment 2 Documented in Chart    US Abdomen Limited  01/28/2014   CLINICAL DATA:  Initial evaluation for right upper quadrant pain.  EXAM: US ABDOMEN LIMITED - RIGHT UPPER QUADRANT  COMPARISON:  Prior CT from 01/21/2014  FINDINGS: Gallbladder:  Multiple echogenic shadowing stones present within the gallbladder lumen measuring up to 1.3 cm in diameter. Several of these appeared adherent to the gallbladder wall. Probable gallbladder sludge present as well. Gallbladder wall minimally thickened to 4 mm. No free pericholecystic fluid. No sonographic Murphy sign elicited on exam.  Common bile duct:  Diameter: 7.6 mm, within normal limits for patient age. No intrahepatic biliary dilatation.  Liver:  No focal lesion identified. Within normal limits in parenchymal echogenicity.  IMPRESSION: 1. Stones with sludge within the gallbladder lumen with minimal gallbladder wall thickening. Several of the stones appeared adherent to the gallbladder wall. No overt sonographic evidence for acute cholecystitis. 2. No biliary dilatation identified.   Electronically Signed   By: Jeannine Boga M.D.   On: 01/28/2014 03:58   Ct Angio Abd/pel W/ And/or W/o  01/28/2014   CLINICAL DATA:  In the extreme abdominal pain and abdominal distention. Nausea. Generalized abdominal pain.  EXAM: CTA ABDOMEN AND PELVIS wITHOUT AND WITH CONTRAST  TECHNIQUE: Multidetector CT imaging of the abdomen and pelvis was performed using the standard protocol during bolus administration of intravenous contrast. Multiplanar reconstructed images and MIPs were obtained and reviewed to evaluate the vascular anatomy.  CONTRAST:  178m OMNIPAQUE IOHEXOL 350 MG/ML SOLN  COMPARISON:  Ultrasound abdomen 01/28/2014. CT abdomen and pelvis 01/21/2014.  FINDINGS: CT images obtained during the  angiographic phase after contrast administration demonstrate diffuse calcification and mild mural thrombus/atherosclerotic changes in the abdominal aorta. No evidence of aneurysm or dissection. The abdominal aorta, celiac axis, superior mesenteric artery, single bilateral renal arteries, inferior mesenteric artery, and bilateral common iliac, external iliac, internal iliac, and common femoral arteries are patent.  Mild dependent changes in the lung bases. Small pericardial effusion or thickening. Thickening of the wall of the distal esophagus may indicate reflux esophagitis.  Cholelithiasis with gas in the gallbladder and mild gallbladder wall thickening. Appearance is suspicious for gangrenous cholecystitis. No bile duct dilatation. The liver, spleen, pancreas, adrenal glands, kidneys, inferior vena cava, and retroperitoneal lymph nodes appear unremarkable. Stomach is mildly distended with a fluid. No gastric wall thickening. Small bowel are decompressed. Diffusely stool-filled colon without distention. No free air or free fluid in the abdomen.  Pelvis: Prostate gland is enlarged. Bladder wall is mildly thickened which may be due to bladder wall hypertrophy or infection. Correlate clinically for signs of cystitis. Diverticulosis of the sigmoid colon. No evidence of diverticulitis. The appendix is normal. No free or loculated pelvic fluid collections. No pelvic mass or lymphadenopathy. Degenerative changes in the lumbar spine. No destructive bone lesions appreciated.  Review of the MIP images confirms the above findings.  IMPRESSION: No evidence of abdominal aortic aneurysm or dissection. Abdominal aorta and major branch vessels are patent.  Cholelithiasis with gas in the gallbladder and mild gallbladder wall thickening worrisome for gangrenous cholecystitis.   Electronically Signed   By: WLucienne CapersM.D.   On: 01/28/2014 04:58       Assessment/Plan 1. Abdominal pain 2.  Cholelithiasis 3. DM,  untreated 4. HTN 5. Hyperlipidemia  Plan: 1. The patient states he has abdominal pain "inside" his abdomen, but no pain with palpation on exam.  His overall story is not great for biliary colic, but he does have stones on his ultrasound.  He is nontender over his gallbladder on exam with deep palpation.  The air inside his gallbladder does not necessarily indicated gangrenous cholecystitis as this is seen in the gallbladder wall, not within the gallbladder itself.  We have asked for medicine to admit to manage his DM and HTN.  Dr. Barry Dienes will evaluate the patient to determine if she feels he will need his gallbladder removed given he has stones and some vague abdominal pain.  Teshaun Olarte E 01/28/2014, 9:10 AM Pager: [phone]

## 2014-01-28 NOTE — ED Provider Notes (Signed)
CSN: [id number]     Arrival date & time 01/28/14  0202 History   First MD Initiated Contact with Patient 01/28/14 0206     This chart was scribed for Peter Arthurs, MD by Peter Mann, ED Scribe. This patient was seen in room D33C/D33C and the patient's care was started 2:14 AM.  Chief Complaint  Patient presents with  . Abdominal Pain   HPI  HPI Comments: Peter Mann is a 73 y.o. male with a PMHx of HTN and hyperlipidemia who presents to the Emergency Department complaining of constant, moderate abdominal pain x 6 hours. He also reports associated nausea. Pt states he ate some cake approximately 10 minutes to 6 PM prior to noting discomfort and after taking a dose of Tramadol. Peter Mann was seen 11/14 for flank pain and was diagnosed with cholelithiasis without evidence of cholecystitis per CT scans. At that time pt was sent home with prescription for Tramadol. He denies any fever, chills, vomiting, or diarrhea. No CP or SOB. No known allergies to medications.   Past Medical History  Diagnosis Date  . Dizziness   . Hypertension   . Hyperlipidemia   . History of tobacco abuse   . Chest pain    History reviewed. No pertinent past surgical history. Family History  Problem Relation Age of Onset  . Cancer Brother   . Cancer Sister    History  Substance Use Topics  . Smoking status: Former Smoker    Quit date: 02/01/2010  . Smokeless tobacco: Not on file  . Alcohol Use: No     Comment: quit 22 years ago     Review of Systems  Constitutional: Negative for fever and chills.  Respiratory: Negative for shortness of breath.   Cardiovascular: Negative for chest pain.  Gastrointestinal: Positive for nausea and abdominal pain. Negative for vomiting and diarrhea.  All other systems reviewed and are negative.     Allergies  Review of patient's allergies indicates no known allergies.  Home Medications   Prior to Admission medications   Medication Sig Start Date End Date Taking?  Authorizing Provider  ALPRAZolam (XANAX) 0.25 MG tablet Take 0.25 mg by mouth 2 (two) times daily as needed for anxiety.  12/20/12  Yes Historical Provider, MD  amLODipine (NORVASC) 10 MG tablet Take 10 mg by mouth daily.   Yes Historical Provider, MD  aspirin 81 MG tablet Take 81 mg by mouth daily.   Yes Historical Provider, MD  Calcium Carb-Cholecalciferol (CALCIUM 600 + D PO) Take 1 tablet by mouth daily.   Yes Historical Provider, MD  carboxymethylcellulose (REFRESH PLUS) 0.5 % SOLN Place 1 drop into both eyes 3 (three) times daily as needed (dry eyes).    Yes Historical Provider, MD  dexlansoprazole (DEXILANT) 60 MG capsule Take 60 mg by mouth daily.   Yes Historical Provider, MD  fluorometholone (FML) 0.1 % ophthalmic suspension Place 1 drop into the left eye 4 (four) times daily.   Yes Historical Provider, MD  hydrochlorothiazide (HYDRODIURIL) 12.5 MG tablet Take 12.5 mg by mouth daily.   Yes Historical Provider, MD  HYDROcodone-acetaminophen (NORCO/VICODIN) 5-325 MG per tablet Take 1 tablet by mouth every 6 (six) hours as needed for moderate pain. 01/21/14  Yes Fredia Sorrow, MD  ibuprofen (ADVIL,MOTRIN) 200 MG tablet Take 200 mg by mouth every 6 (six) hours as needed for moderate pain.    Yes Historical Provider, MD  magnesium hydroxide (MILK OF MAGNESIA) 400 MG/5ML suspension Take 30 mLs by mouth daily  as needed for mild constipation.   Yes Historical Provider, MD  meclizine (ANTIVERT) 25 MG tablet Take 25 mg by mouth 3 (three) times daily as needed for dizziness.   Yes Historical Provider, MD  metoprolol (LOPRESSOR) 50 MG tablet Take 50 mg by mouth daily.  12/11/12  Yes Historical Provider, MD  Olopatadine HCl (PATADAY) 0.2 % SOLN Place 1 drop into both eyes daily.   Yes Historical Provider, MD  potassium chloride (K-DUR,KLOR-CON) 10 MEQ tablet Take 10 mEq by mouth daily.   Yes Historical Provider, MD  promethazine (PHENERGAN) 25 MG tablet Take 25 mg by mouth every 6 (six) hours as needed  for nausea.    Yes Historical Provider, MD  rosuvastatin (CRESTOR) 20 MG tablet Take 20 mg by mouth daily.   Yes Historical Provider, MD  tamsulosin (FLOMAX) 0.4 MG CAPS capsule Take 0.4 mg by mouth daily after supper.   Yes Historical Provider, MD  tiZANidine (ZANAFLEX) 4 MG tablet Take 4 mg by mouth every 6 (six) hours as needed for muscle spasms.   Yes Historical Provider, MD  traMADol (ULTRAM) 50 MG tablet Take 50 mg by mouth every 12 (twelve) hours as needed for moderate pain.  01/27/13  Yes Historical Provider, MD  tamsulosin (FLOMAX) 0.4 MG CAPS capsule TAKE 1 CAPSULE AT BEDTIME Patient not taking: Reported on 01/21/2014 11/07/13   Lora Paula, MD  tamsulosin (FLOMAX) 0.4 MG CAPS capsule TAKE 1 CAPSULE AT BEDTIME 12/07/13   Lora Paula, MD   Triage Vitals: BP 167/79 mmHg  Pulse 73  Temp(Src) 97.5 F (36.4 C) (Oral)  Resp 22  SpO2 98%   Physical Exam  Constitutional: He is oriented to person, place, and time. He appears well-developed and well-nourished.  HENT:  Head: Normocephalic.  Eyes: EOM are normal.  Neck: Normal range of motion.  Pulmonary/Chest: Effort normal.  Abdominal: Soft. He exhibits no distension. There is no tenderness.  Musculoskeletal: Normal range of motion.  Neurological: He is alert and oriented to person, place, and time.  Psychiatric: He has a normal mood and affect.  Nursing note and vitals reviewed.   ED Course  Procedures (including critical care time)  DIAGNOSTIC STUDIES: Oxygen Saturation is 98% on RA, Normal by my interpretation.    COORDINATION OF CARE: 2:13 AM- Will order CT angio abdomen w/cm and /or without contrast, US abdomen limited, i-stat, CBC, CMP, lipase, and urinalysis. Will give fluidsDiscussed treatment plan with pt at bedside and pt agreed to plan.     Labs Review Labs Reviewed  CBC WITH DIFFERENTIAL - Abnormal; Notable for the following:    Neutrophils Relative % 83 (*)    Lymphocytes Relative 9 (*)    All other  components within normal limits  COMPREHENSIVE METABOLIC PANEL - Abnormal; Notable for the following:    Sodium 135 (*)    Chloride 92 (*)    Glucose, Bld 276 (*)    Alkaline Phosphatase 131 (*)    GFR calc non Af Amer 57 (*)    GFR calc Af Amer 66 (*)    Anion gap 16 (*)    All other components within normal limits  URINALYSIS, ROUTINE W REFLEX MICROSCOPIC - Abnormal; Notable for the following:    Glucose, UA 500 (*)    All other components within normal limits  CULTURE, BLOOD (ROUTINE X 2)  CULTURE, BLOOD (ROUTINE X 2)  LIPASE, BLOOD  I-STAT CG4 LACTIC ACID, ED  Randolm Idol, ED    Imaging Review US  Abdomen Limited  01/28/2014   CLINICAL DATA:  Initial evaluation for right upper quadrant pain.  EXAM: US ABDOMEN LIMITED - RIGHT UPPER QUADRANT  COMPARISON:  Prior CT from 01/21/2014  FINDINGS: Gallbladder:  Multiple echogenic shadowing stones present within the gallbladder lumen measuring up to 1.3 cm in diameter. Several of these appeared adherent to the gallbladder wall. Probable gallbladder sludge present as well. Gallbladder wall minimally thickened to 4 mm. No free pericholecystic fluid. No sonographic Murphy sign elicited on exam.  Common bile duct:  Diameter: 7.6 mm, within normal limits for patient age. No intrahepatic biliary dilatation.  Liver:  No focal lesion identified. Within normal limits in parenchymal echogenicity.  IMPRESSION: 1. Stones with sludge within the gallbladder lumen with minimal gallbladder wall thickening. Several of the stones appeared adherent to the gallbladder wall. No overt sonographic evidence for acute cholecystitis. 2. No biliary dilatation identified.   Electronically Signed   By: Jeannine Boga M.D.   On: 01/28/2014 03:58   Ct Angio Abd/pel W/ And/or W/o  01/28/2014   CLINICAL DATA:  In the extreme abdominal pain and abdominal distention. Nausea. Generalized abdominal pain.  EXAM: CTA ABDOMEN AND PELVIS wITHOUT AND WITH CONTRAST  TECHNIQUE:  Multidetector CT imaging of the abdomen and pelvis was performed using the standard protocol during bolus administration of intravenous contrast. Multiplanar reconstructed images and MIPs were obtained and reviewed to evaluate the vascular anatomy.  CONTRAST:  124mL OMNIPAQUE IOHEXOL 350 MG/ML SOLN  COMPARISON:  Ultrasound abdomen 01/28/2014. CT abdomen and pelvis 01/21/2014.  FINDINGS: CT images obtained during the angiographic phase after contrast administration demonstrate diffuse calcification and mild mural thrombus/atherosclerotic changes in the abdominal aorta. No evidence of aneurysm or dissection. The abdominal aorta, celiac axis, superior mesenteric artery, single bilateral renal arteries, inferior mesenteric artery, and bilateral common iliac, external iliac, internal iliac, and common femoral arteries are patent.  Mild dependent changes in the lung bases. Small pericardial effusion or thickening. Thickening of the wall of the distal esophagus may indicate reflux esophagitis.  Cholelithiasis with gas in the gallbladder and mild gallbladder wall thickening. Appearance is suspicious for gangrenous cholecystitis. No bile duct dilatation. The liver, spleen, pancreas, adrenal glands, kidneys, inferior vena cava, and retroperitoneal lymph nodes appear unremarkable. Stomach is mildly distended with a fluid. No gastric wall thickening. Small bowel are decompressed. Diffusely stool-filled colon without distention. No free air or free fluid in the abdomen.  Pelvis: Prostate gland is enlarged. Bladder wall is mildly thickened which may be due to bladder wall hypertrophy or infection. Correlate clinically for signs of cystitis. Diverticulosis of the sigmoid colon. No evidence of diverticulitis. The appendix is normal. No free or loculated pelvic fluid collections. No pelvic mass or lymphadenopathy. Degenerative changes in the lumbar spine. No destructive bone lesions appreciated.  Review of the MIP images confirms  the above findings.  IMPRESSION: No evidence of abdominal aortic aneurysm or dissection. Abdominal aorta and major branch vessels are patent.  Cholelithiasis with gas in the gallbladder and mild gallbladder wall thickening worrisome for gangrenous cholecystitis.   Electronically Signed   By: Lucienne Capers M.D.   On: 01/28/2014 04:58     EKG Interpretation   Date/Time:  Saturday January 28 2014 02:28:16 EST Ventricular Rate:  58 PR Interval:  197 QRS Duration: 103 QT Interval:  399 QTC Calculation: 392 R Axis:   14 Text Interpretation:  Sinus rhythm Abnormal R-wave progression, early  transition No significant change since last tracing Confirmed by YAO  MD,  DAVID ([id number]) on 01/28/2014 4:08:51 AM      MDM   Final diagnoses:  RUQ abdominal pain  Abdominal pain    Peter Mann is a 73 y.o. male here with ab pain after eating cake. Consider symptomatic cholelithiasis vs cholecystitis. However, abdomen relatively soft and nontender initially. I am concerned for possible mesenteric ischemia. Will order lactate, labs, CT angio, [place].   5:42 AM US showed cholelithiasis with borderline thickened gallbladder wall. CT showed possible gangrenous cholecystitis but no mesenteric ischemia. He is still in pain despite pain meds. On repeat exam, + diffuse tenderness, worse in RUQ. WBC nl, lactate nl. I ordered cultures and zosyn. I consulted Dr. Redmond Pulling from surgery, who will evaluate patient.   7:14 AM Signed out to Dr. Marcina Millard to follow up surgery consult.   I personally performed the services described in this documentation, which was scribed in my presence. The recorded information has been reviewed and is accurate.    Peter Arthurs, MD 01/28/14 [phone]

## 2014-01-29 ENCOUNTER — Inpatient Hospital Stay (HOSPITAL_COMMUNITY): Payer: PRIVATE HEALTH INSURANCE

## 2014-01-29 DIAGNOSIS — K8000 Calculus of gallbladder with acute cholecystitis without obstruction: Secondary | ICD-10-CM | POA: Diagnosis not present

## 2014-01-29 LAB — HEPATIC FUNCTION PANEL
ALT: 12 U/L (ref 0–53)
AST: 15 U/L (ref 0–37)
Albumin: 3.3 g/dL — ABNORMAL LOW (ref 3.5–5.2)
Alkaline Phosphatase: 96 U/L (ref 39–117)
Bilirubin, Direct: <0.2 mg/dL (ref 0.0–0.3)
Total Bilirubin: 0.7 mg/dL (ref 0.3–1.2)
Total Protein: 6.9 g/dL (ref 6.0–8.3)

## 2014-01-29 LAB — BASIC METABOLIC PANEL
Anion gap: 14 (ref 5–15)
BUN: 11 mg/dL (ref 6–23)
CO2: 25 mEq/L (ref 19–32)
Calcium: 8.8 mg/dL (ref 8.4–10.5)
Chloride: 93 mEq/L — ABNORMAL LOW (ref 96–112)
Creatinine, Ser: 1.07 mg/dL (ref 0.50–1.35)
GFR calc Af Amer: 77 mL/min — ABNORMAL LOW (ref >90)
GFR calc non Af Amer: 66 mL/min — ABNORMAL LOW (ref >90)
Glucose, Bld: 148 mg/dL — ABNORMAL HIGH (ref 70–99)
Potassium: 4.5 mEq/L (ref 3.7–5.3)
Sodium: 132 mEq/L — ABNORMAL LOW (ref 137–147)

## 2014-01-29 LAB — CBC
HCT: 39.5 % (ref 39.0–52.0)
Hemoglobin: 13.2 g/dL (ref 13.0–17.0)
MCH: 27.6 pg (ref 26.0–34.0)
MCHC: 33.4 g/dL (ref 30.0–36.0)
MCV: 82.6 fL (ref 78.0–100.0)
Platelets: 187 10*3/uL (ref 150–400)
RBC: 4.78 MIL/uL (ref 4.22–5.81)
RDW: 14.2 % (ref 11.5–15.5)
WBC: 15.8 10*3/uL — ABNORMAL HIGH (ref 4.0–10.5)

## 2014-01-29 LAB — HEMOGLOBIN A1C
Hgb A1c MFr Bld: 9.1 % — ABNORMAL HIGH (ref <5.7)
Hgb A1c MFr Bld: 8.8 % — ABNORMAL HIGH (ref <5.7)
Mean Plasma Glucose: 214 mg/dL — ABNORMAL HIGH (ref <117)
Mean Plasma Glucose: 206 mg/dL — ABNORMAL HIGH (ref <117)

## 2014-01-29 LAB — GLUCOSE, CAPILLARY
Glucose-Capillary: 145 mg/dL — ABNORMAL HIGH (ref 70–99)
Glucose-Capillary: 140 mg/dL — ABNORMAL HIGH (ref 70–99)
Glucose-Capillary: 164 mg/dL — ABNORMAL HIGH (ref 70–99)
Glucose-Capillary: 154 mg/dL — ABNORMAL HIGH (ref 70–99)
Glucose-Capillary: 211 mg/dL — ABNORMAL HIGH (ref 70–99)
Glucose-Capillary: 146 mg/dL — ABNORMAL HIGH (ref 70–99)

## 2014-01-29 MED ORDER — MORPHINE SULFATE 4 MG/ML IJ SOLN
INTRAMUSCULAR | Status: AC
  Administered 2014-01-29: 09:00:00
  Filled 2014-01-29: qty 1

## 2014-01-29 MED ORDER — MORPHINE SULFATE 4 MG/ML IJ SOLN
3.7000 mg | Freq: Once | INTRAMUSCULAR | Status: AC
  Administered 2014-01-29: 3.7 mg via INTRAVENOUS

## 2014-01-29 MED ORDER — STERILE WATER FOR INJECTION IJ SOLN
INTRAMUSCULAR | Status: DC
Start: 2014-01-29 — End: 2014-01-29
  Filled 2014-01-29: qty 10

## 2014-01-29 MED ORDER — SINCALIDE 5 MCG IJ SOLR
INTRAMUSCULAR | Status: AC
  Filled 2014-01-29: qty 5

## 2014-01-29 MED ORDER — TECHNETIUM TC 99M MEBROFENIN IV KIT
5.0000 | PACK | Freq: Once | INTRAVENOUS | Status: AC | PRN
  Administered 2014-01-29: 5 via INTRAVENOUS

## 2014-01-29 NOTE — Progress Notes (Signed)
Patient ID: Peter Mann  male  PPI:[id number]    DOB: 03/21/1940    DOA: 01/28/2014  PCP: Gaynelle Arabian, MD  Assessment/Plan: Principal Problem:   Abdominal pain with likely acute cholecystitis: Pain improving today, LFTs normal, lipase normal, poor historian - Continue nothing by mouth status, IV fluids, pain control and antiemetics - general surgery consulted, recommended HIDA scan - Continue IV Rocephin  Active Problems:   Essential hypertension - Currently stable, continue Norvasc, metoprolol    Hyperglycemia with likely underlying diabetes mellitus - Obtain hemoglobin A1c, continue sliding scale insulin  BPH, history of prostate Ca- Continue Flomax  GERD -Continue PPI  DVT Prophylaxis: SCDs  Code Status: Full code  Family Communication:  Disposition: Discussed in detail with patient's daughter, Peter Mann, she is okay with cholecystectomy if pursued by surgery. She would like to have surgeons call her prior to the surgery.  Consultants:  Gen. surgery  Procedures:  None  Antibiotics:  IV Rocephin    Subjective: Patient seen and examined, denies any abdominal pain at this time, feels better, no nausea or vomiting or chills, states "its not hard today"  Objective: Weight change:   Intake/Output Summary (Last 24 hours) at 01/29/14 1014 Last data filed at 01/29/14 0800  Gross per 24 hour  Intake 1616.04 ml  Output   1425 ml  Net 191.04 ml   Blood pressure 154/73, pulse 88, temperature 97.4 F (36.3 C), temperature source Oral, resp. rate 16, height 5\' 7"  (1.702 m), weight 91.173 kg (201 lb), SpO2 96 %.  Physical Exam: General: Alert and awake, oriented to self CVS: S1-S2 clear, no murmur rubs or gallops Chest: CTAB  Abdomen: soft , ND, NBS, non tender Extremities: no c/c/e bilaterally   Lab Results: Basic Metabolic Panel:  Recent Labs Lab 01/28/14 0211 01/29/14 0405  NA 135* 132*  K 4.0 4.5  CL 92* 93*  CO2 27 25  GLUCOSE  276* 148*  BUN 14 11  CREATININE 1.21 1.07  CALCIUM 9.2 8.8   Liver Function Tests:  Recent Labs Lab 01/28/14 0211 01/29/14 0900  AST 19 15  ALT 18 12  ALKPHOS 131* 96  BILITOT 0.4 0.7  PROT 7.7 6.9  ALBUMIN 4.1 3.3*    Recent Labs Lab 01/28/14 0211  LIPASE 18   No results for input(s): AMMONIA in the last 168 hours. CBC:  Recent Labs Lab 01/28/14 0211 01/29/14 0405  WBC 8.6 15.8*  NEUTROABS 7.2  --   HGB 13.5 13.2  HCT 41.3 39.5  MCV 83.6 82.6  PLT 190 187   Cardiac Enzymes: No results for input(s): CKTOTAL, CKMB, CKMBINDEX, TROPONINI in the last 168 hours. BNP: Invalid input(s): POCBNP CBG:  Recent Labs Lab 01/28/14 1710 01/28/14 2012 01/29/14 0008 01/29/14 0415 01/29/14 0739  GLUCAP 139* 127* 145* 140* 164*     Micro Results: Recent Results (from the past 240 hour(s))  Surgical pcr screen     Status: None   Collection Time: 01/28/14 11:20 AM  Result Value Ref Range Status   MRSA, PCR NEGATIVE NEGATIVE Final   Staphylococcus aureus NEGATIVE NEGATIVE Final    Comment:        The Xpert SA Assay (FDA approved for NASAL specimens in patients over 56 years of age), is one component of a comprehensive surveillance program.  Test performance has been validated by [employer] for patients greater than or equal to 41 year old. It is not intended to diagnose infection nor to guide or monitor treatment.  Studies/Results: Ct Abdomen Pelvis W Contrast  01/21/2014   CLINICAL DATA:  Abdominal pain for 6 weeks  EXAM: CT ABDOMEN AND PELVIS WITH CONTRAST  TECHNIQUE: Multidetector CT imaging of the abdomen and pelvis was performed using the standard protocol following bolus administration of intravenous contrast.  CONTRAST:  118mL OMNIPAQUE IOHEXOL 300 MG/ML  SOLN  COMPARISON:  07/03/2006  FINDINGS: The lung bases are free of acute infiltrate or sizable effusion.  The liver is diffusely fatty infiltrated. The spleen, adrenal glands and pancreas are  normal in their CT appearance. The gallbladder is well distended and demonstrates multiple small gallstones. No wall thickening or pericholecystic fluid is noted. Kidneys are well visualized bilaterally and demonstrate some renal cystic change in the upper pole on the right. No renal calculi or obstructive changes are seen.  The appendix is not well visualized although no inflammatory changes to suggest appendicitis are noted. 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 The bladder is well distended. The prostate indents upon the inferior aspect of the bladder. Degenerative changes of the lumbar spine are seen. No pelvic mass lesion is noted. Aortoiliac calcifications are noted without aneurysmal dilatation.  IMPRESSION: Cholelithiasis without acute abnormality.  No acute abnormality is noted.   Electronically Signed   By: Inez Catalina M.D.   On: 01/21/2014 07:51   US Abdomen Limited  01/28/2014   CLINICAL DATA:  Initial evaluation for right upper quadrant pain.  EXAM: US ABDOMEN LIMITED - RIGHT UPPER QUADRANT  COMPARISON:  Prior CT from 01/21/2014  FINDINGS: Gallbladder:  Multiple echogenic shadowing stones present within the gallbladder lumen measuring up to 1.3 cm in diameter. Several of these appeared adherent to the gallbladder wall. Probable gallbladder sludge present as well. Gallbladder wall minimally thickened to 4 mm. No free pericholecystic fluid. No sonographic Murphy sign elicited on exam.  Common bile duct:  Diameter: 7.6 mm, within normal limits for patient age. No intrahepatic biliary dilatation.  Liver:  No focal lesion identified. Within normal limits in parenchymal echogenicity.  IMPRESSION: 1. Stones with sludge within the gallbladder lumen with minimal gallbladder wall thickening. Several of the stones appeared adherent to the gallbladder wall. No overt sonographic evidence for acute cholecystitis. 2. No biliary dilatation identified.   Electronically Signed    By: Jeannine Boga M.D.   On: 01/28/2014 03:58   Ct Angio Abd/pel W/ And/or W/o  01/28/2014   CLINICAL DATA:  In the extreme abdominal pain and abdominal distention. Nausea. Generalized abdominal pain.  EXAM: CTA ABDOMEN AND PELVIS wITHOUT AND WITH CONTRAST  TECHNIQUE: Multidetector CT imaging of the abdomen and pelvis was performed using the standard protocol during bolus administration of intravenous contrast. Multiplanar reconstructed images and MIPs were obtained and reviewed to evaluate the vascular anatomy.  CONTRAST:  122mL OMNIPAQUE IOHEXOL 350 MG/ML SOLN  COMPARISON:  Ultrasound abdomen 01/28/2014. CT abdomen and pelvis 01/21/2014.  FINDINGS: CT images obtained during the angiographic phase after contrast administration demonstrate diffuse calcification and mild mural thrombus/atherosclerotic changes in the abdominal aorta. No evidence of aneurysm or dissection. The abdominal aorta, celiac axis, superior mesenteric artery, single bilateral renal arteries, inferior mesenteric artery, and bilateral common iliac, external iliac, internal iliac, and common femoral arteries are patent.  Mild dependent changes in the lung bases. Small pericardial effusion or thickening. Thickening of the wall of the distal esophagus may indicate reflux esophagitis.  Cholelithiasis with gas in the gallbladder and mild gallbladder wall thickening. Appearance is suspicious for gangrenous cholecystitis. No bile duct  dilatation. The liver, spleen, pancreas, adrenal glands, kidneys, inferior vena cava, and retroperitoneal lymph nodes appear unremarkable. Stomach is mildly distended with a fluid. No gastric wall thickening. Small bowel are decompressed. Diffusely stool-filled colon without distention. No free air or free fluid in the abdomen.  Pelvis: Prostate gland is enlarged. Bladder wall is mildly thickened which may be due to bladder wall hypertrophy or infection. Correlate clinically for signs of cystitis. Diverticulosis  of the sigmoid colon. No evidence of diverticulitis. The appendix is normal. No free or loculated pelvic fluid collections. No pelvic mass or lymphadenopathy. Degenerative changes in the lumbar spine. No destructive bone lesions appreciated.  Review of the MIP images confirms the above findings.  IMPRESSION: No evidence of abdominal aortic aneurysm or dissection. Abdominal aorta and major branch vessels are patent.  Cholelithiasis with gas in the gallbladder and mild gallbladder wall thickening worrisome for gangrenous cholecystitis.   Electronically Signed   By: Lucienne Capers M.D.   On: 01/28/2014 04:58    Medications: Scheduled Meds: . amLODipine  10 mg Oral Daily  . aspirin  81 mg Oral Daily  . cefTRIAXone (ROCEPHIN)  IV  2 g Intravenous Q24H  . fluorometholone  1 drop Left Eye QID  . heparin  5,000 Units Subcutaneous 3 times per day  . insulin aspart  0-9 Units Subcutaneous 6 times per day  . metoprolol  50 mg Oral Daily  . morphine      . olopatadine  1 drop Both Eyes Daily  . pantoprazole  40 mg Oral Daily  . pneumococcal 23 valent vaccine  0.5 mL Intramuscular Tomorrow-1000  . tamsulosin  0.4 mg Oral QPC supper      LOS: 1 day   Isbella Arline M.D. [facility] 01/29/2014, 10:14 AM Pager: [phone]  If 7PM-7AM, please contact night-coverage [email or web address] Password [email or web address]

## 2014-01-29 NOTE — Progress Notes (Signed)
Subjective: No complaints. Just got back from nuc med  Objective: Vital signs in last 24 hours: Temp:  [97.4 F (36.3 C)-98.3 F (36.8 C)] 98.3 F (36.8 C) (11/22 1049) Pulse Rate:  [68-106] 106 (11/22 1049) Resp:  [15-18] 16 (11/22 0634) BP: (142-156)/(65-84) 156/84 mmHg (11/22 1049) SpO2:  [92 %-98 %] 98 % (11/22 1049) Last BM Date: 01/27/14  Intake/Output from previous day: 11/21 0701 - 11/22 0700 In: 2552.8 [I.V.:2502.8; IV Piggyback:50] Out: 8144 [Urine:1450] Intake/Output this shift: Total I/O In: 63.3 [I.V.:63.3] Out: 200 [Urine:200]  Awake, alert ox3 - person, place, city, state, president but does think he and I met downstairs. Mutters Obese, soft, no rt/guarding. ?mild TTP in RUQ  Lab Results:   Recent Labs  01/28/14 0211 01/29/14 0405  WBC 8.6 15.8*  HGB 13.5 13.2  HCT 41.3 39.5  PLT 190 187   BMET  Recent Labs  01/28/14 0211 01/29/14 0405  NA 135* 132*  K 4.0 4.5  CL 92* 93*  CO2 27 25  GLUCOSE 276* 148*  BUN 14 11  CREATININE 1.21 1.07  CALCIUM 9.2 8.8   PT/INR No results for input(s): LABPROT, INR in the last 72 hours. ABG No results for input(s): PHART, HCO3 in the last 72 hours.  Invalid input(s): PCO2, PO2  Studies/Results: Nm Hepatobiliary Including Gb  01/29/2014   CLINICAL DATA:  Cholelithiasis and clinical evidence of probable cholecystitis.  EXAM: NUCLEAR MEDICINE HEPATOBILIARY IMAGING  TECHNIQUE: Sequential images of the abdomen were obtained out to 60 minutes following intravenous administration of radiopharmaceutical.  RADIOPHARMACEUTICALS:  5.0 Millicurie [id number] Choletec. During the procedure, the patient also received 3.7 mg of IV morphine.  COMPARISON:  Abdominal ultrasound on 01/28/2014 and CTA of the abdomen on 01/28/2014.  FINDINGS: Imaging demonstrates normal uptake of radiopharmaceutical by the liver and normal biliary excretion with prompt visualization of the small bowel. There is nonvisualization of the gallbladder  over 60 min. After further administration of IV morphine, there is continued nonvisualization of the gallbladder over 30 min. Findings are suggestive of cholecystitis.  IMPRESSION: Positive hepatobiliary imaging demonstrating nonvisualization of the gallbladder prior to and following administration of IV morphine. Findings are consistent with cholecystitis.   Electronically Signed   By: Aletta Edouard M.D.   On: 01/29/2014 10:47   US Abdomen Limited  01/28/2014   CLINICAL DATA:  Initial evaluation for right upper quadrant pain.  EXAM: US ABDOMEN LIMITED - RIGHT UPPER QUADRANT  COMPARISON:  Prior CT from 01/21/2014  FINDINGS: Gallbladder:  Multiple echogenic shadowing stones present within the gallbladder lumen measuring up to 1.3 cm in diameter. Several of these appeared adherent to the gallbladder wall. Probable gallbladder sludge present as well. Gallbladder wall minimally thickened to 4 mm. No free pericholecystic fluid. No sonographic Murphy sign elicited on exam.  Common bile duct:  Diameter: 7.6 mm, within normal limits for patient age. No intrahepatic biliary dilatation.  Liver:  No focal lesion identified. Within normal limits in parenchymal echogenicity.  IMPRESSION: 1. Stones with sludge within the gallbladder lumen with minimal gallbladder wall thickening. Several of the stones appeared adherent to the gallbladder wall. No overt sonographic evidence for acute cholecystitis. 2. No biliary dilatation identified.   Electronically Signed   By: Jeannine Boga M.D.   On: 01/28/2014 03:58   Ct Angio Abd/pel W/ And/or W/o  01/28/2014   CLINICAL DATA:  In the extreme abdominal pain and abdominal distention. Nausea. Generalized abdominal pain.  EXAM: CTA ABDOMEN AND PELVIS wITHOUT AND WITH CONTRAST  TECHNIQUE: Multidetector CT imaging of the abdomen and pelvis was performed using the standard protocol during bolus administration of intravenous contrast. Multiplanar reconstructed images and MIPs were  obtained and reviewed to evaluate the vascular anatomy.  CONTRAST:  149m OMNIPAQUE IOHEXOL 350 MG/ML SOLN  COMPARISON:  Ultrasound abdomen 01/28/2014. CT abdomen and pelvis 01/21/2014.  FINDINGS: CT images obtained during the angiographic phase after contrast administration demonstrate diffuse calcification and mild mural thrombus/atherosclerotic changes in the abdominal aorta. No evidence of aneurysm or dissection. The abdominal aorta, celiac axis, superior mesenteric artery, single bilateral renal arteries, inferior mesenteric artery, and bilateral common iliac, external iliac, internal iliac, and common femoral arteries are patent.  Mild dependent changes in the lung bases. Small pericardial effusion or thickening. Thickening of the wall of the distal esophagus may indicate reflux esophagitis.  Cholelithiasis with gas in the gallbladder and mild gallbladder wall thickening. Appearance is suspicious for gangrenous cholecystitis. No bile duct dilatation. The liver, spleen, pancreas, adrenal glands, kidneys, inferior vena cava, and retroperitoneal lymph nodes appear unremarkable. Stomach is mildly distended with a fluid. No gastric wall thickening. Small bowel are decompressed. Diffusely stool-filled colon without distention. No free air or free fluid in the abdomen.  Pelvis: Prostate gland is enlarged. Bladder wall is mildly thickened which may be due to bladder wall hypertrophy or infection. Correlate clinically for signs of cystitis. Diverticulosis of the sigmoid colon. No evidence of diverticulitis. The appendix is normal. No free or loculated pelvic fluid collections. No pelvic mass or lymphadenopathy. Degenerative changes in the lumbar spine. No destructive bone lesions appreciated.  Review of the MIP images confirms the above findings.  IMPRESSION: No evidence of abdominal aortic aneurysm or dissection. Abdominal aorta and major branch vessels are patent.  Cholelithiasis with gas in the gallbladder and mild  gallbladder wall thickening worrisome for gangrenous cholecystitis.   Electronically Signed   By: WLucienne CapersM.D.   On: 01/28/2014 04:58    Anti-infectives: Anti-infectives    Start     Dose/Rate Route Frequency Ordered Stop   01/28/14 1500  cefTRIAXone (ROCEPHIN) 2 g in dextrose 5 % 50 mL IVPB - Premix     2 g100 mL/hr over 30 Minutes Intravenous Every 24 hours 01/28/14 1350     01/28/14 0515  piperacillin-tazobactam (ZOSYN) IVPB 3.375 g     3.375 g100 mL/hr over 30 Minutes Intravenous  Once 01/28/14 0512 01/28/14 04099     Assessment/Plan: Acute cholecystitis  Confirmed on HIDA Pt is oriented however does appear to have some confusion. Will try to reach daughter and discuss surgery. Plan surgery Monday. Can have clears Npo p mn  ELeighton Ruff WRedmond Pulling MD, FACS General, Bariatric, & Minimally Invasive Surgery [facility] [place]   LOS: 1 day    WGayland Curry11/22/2015

## 2014-01-29 NOTE — Progress Notes (Signed)
Patient ID: Peter Mann, male   DOB: May 28, 1940, 73 y.o.   MRN: [id number] Patient in HIDA scan.  Will further evaluate and make decisions once HIDA complete  Nevaeh Casillas E 8:46 AM 01/29/2014

## 2014-01-29 NOTE — Progress Notes (Signed)
Pt. inappropriately answering questions,thinks he is at home ,in room looking for clothes reoriented, bed alarm on,stated he was having a bad dream.Op permit not obtained for this reason stated he might or might not have surgery.

## 2014-01-30 ENCOUNTER — Encounter (HOSPITAL_COMMUNITY): Payer: Self-pay | Admitting: Certified Registered"

## 2014-01-30 ENCOUNTER — Encounter (HOSPITAL_COMMUNITY)
Admission: EM | Disposition: A | Discharge status: Home Health | Payer: Self-pay | Source: Home / Self Care | Attending: Internal Medicine

## 2014-01-30 ENCOUNTER — Inpatient Hospital Stay (HOSPITAL_COMMUNITY): Payer: PRIVATE HEALTH INSURANCE | Admitting: Certified Registered"

## 2014-01-30 HISTORY — PX: CHOLECYSTECTOMY: SHX55

## 2014-01-30 LAB — BASIC METABOLIC PANEL
Anion gap: 16 — ABNORMAL HIGH (ref 5–15)
BUN: 18 mg/dL (ref 6–23)
CO2: 21 mEq/L (ref 19–32)
Calcium: 8.4 mg/dL (ref 8.4–10.5)
Chloride: 96 mEq/L (ref 96–112)
Creatinine, Ser: 1.21 mg/dL (ref 0.50–1.35)
GFR calc Af Amer: 66 mL/min — ABNORMAL LOW (ref >90)
GFR calc non Af Amer: 57 mL/min — ABNORMAL LOW (ref >90)
Glucose, Bld: 147 mg/dL — ABNORMAL HIGH (ref 70–99)
Potassium: 3.5 mEq/L — ABNORMAL LOW (ref 3.7–5.3)
Sodium: 133 mEq/L — ABNORMAL LOW (ref 137–147)

## 2014-01-30 LAB — GLUCOSE, CAPILLARY
Glucose-Capillary: 120 mg/dL — ABNORMAL HIGH (ref 70–99)
Glucose-Capillary: 128 mg/dL — ABNORMAL HIGH (ref 70–99)
Glucose-Capillary: 142 mg/dL — ABNORMAL HIGH (ref 70–99)
Glucose-Capillary: 156 mg/dL — ABNORMAL HIGH (ref 70–99)
Glucose-Capillary: 156 mg/dL — ABNORMAL HIGH (ref 70–99)
Glucose-Capillary: 156 mg/dL — ABNORMAL HIGH (ref 70–99)
Glucose-Capillary: 159 mg/dL — ABNORMAL HIGH (ref 70–99)

## 2014-01-30 LAB — CBC
HCT: 36.8 % — ABNORMAL LOW (ref 39.0–52.0)
Hemoglobin: 12.2 g/dL — ABNORMAL LOW (ref 13.0–17.0)
MCH: 26.9 pg (ref 26.0–34.0)
MCHC: 33.2 g/dL (ref 30.0–36.0)
MCV: 81.1 fL (ref 78.0–100.0)
Platelets: 157 10*3/uL (ref 150–400)
RBC: 4.54 MIL/uL (ref 4.22–5.81)
RDW: 14.4 % (ref 11.5–15.5)
WBC: 14.7 10*3/uL — ABNORMAL HIGH (ref 4.0–10.5)

## 2014-01-30 SURGERY — LAPAROSCOPIC CHOLECYSTECTOMY
Anesthesia: General

## 2014-01-30 MED ORDER — HEPARIN SODIUM (PORCINE) 5000 UNIT/ML IJ SOLN
5000.0000 [IU] | Freq: Three times a day (TID) | INTRAMUSCULAR | Status: DC
  Administered 2014-01-31 – 2014-02-02 (×9): 5000 [IU] via SUBCUTANEOUS
  Filled 2014-01-30 (×12): qty 1

## 2014-01-30 MED ORDER — LIDOCAINE HCL (CARDIAC) 20 MG/ML IV SOLN
INTRAVENOUS | Status: DC | PRN
  Administered 2014-01-30: 60 mg via INTRAVENOUS

## 2014-01-30 MED ORDER — PHENYLEPHRINE HCL 10 MG/ML IJ SOLN
INTRAMUSCULAR | Status: DC | PRN
  Administered 2014-01-30 (×2): 80 ug via INTRAVENOUS
  Administered 2014-01-30: 120 ug via INTRAVENOUS
  Administered 2014-01-30: 80 ug via INTRAVENOUS

## 2014-01-30 MED ORDER — FENTANYL CITRATE 0.05 MG/ML IJ SOLN
INTRAMUSCULAR | Status: DC | PRN
  Administered 2014-01-30: 50 ug via INTRAVENOUS
  Administered 2014-01-30: 100 ug via INTRAVENOUS

## 2014-01-30 MED ORDER — 0.9 % SODIUM CHLORIDE (POUR BTL) OPTIME
TOPICAL | Status: DC | PRN
  Administered 2014-01-30: 1000 mL

## 2014-01-30 MED ORDER — VECURONIUM BROMIDE 10 MG IV SOLR
INTRAVENOUS | Status: DC | PRN
  Administered 2014-01-30: 1 mg via INTRAVENOUS
  Administered 2014-01-30: 2 mg via INTRAVENOUS
  Administered 2014-01-30 (×2): 1 mg via INTRAVENOUS

## 2014-01-30 MED ORDER — NEOSTIGMINE METHYLSULFATE 10 MG/10ML IV SOLN
INTRAVENOUS | Status: DC | PRN
  Administered 2014-01-30: 3 mg via INTRAVENOUS

## 2014-01-30 MED ORDER — BUPIVACAINE HCL 0.25 % IJ SOLN
INTRAMUSCULAR | Status: DC | PRN
  Administered 2014-01-30: 7 mL

## 2014-01-30 MED ORDER — LACTATED RINGERS IV SOLN
INTRAVENOUS | Status: DC | PRN
  Administered 2014-01-30 (×2): via INTRAVENOUS

## 2014-01-30 MED ORDER — HEMOSTATIC AGENTS (NO CHARGE) OPTIME
TOPICAL | Status: DC | PRN
  Administered 2014-01-30: 1 via TOPICAL

## 2014-01-30 MED ORDER — SODIUM CHLORIDE 0.9 % IV BOLUS (SEPSIS)
500.0000 mL | Freq: Once | INTRAVENOUS | Status: AC
Start: 2014-01-30 — End: 2014-01-31
  Administered 2014-01-31: 500 mL via INTRAVENOUS

## 2014-01-30 MED ORDER — GLYCOPYRROLATE 0.2 MG/ML IJ SOLN
INTRAMUSCULAR | Status: DC | PRN
  Administered 2014-01-30: 0.4 mg via INTRAVENOUS

## 2014-01-30 MED ORDER — PROPOFOL 10 MG/ML IV BOLUS
INTRAVENOUS | Status: DC | PRN
  Administered 2014-01-30: 170 mg via INTRAVENOUS

## 2014-01-30 MED ORDER — PHENYLEPHRINE HCL 10 MG/ML IJ SOLN
10.0000 mg | INTRAVENOUS | Status: DC | PRN
  Administered 2014-01-30: 20 ug/min via INTRAVENOUS

## 2014-01-30 MED ORDER — FENTANYL CITRATE 0.05 MG/ML IJ SOLN
INTRAMUSCULAR | Status: AC
  Filled 2014-01-30: qty 2

## 2014-01-30 MED ORDER — ONDANSETRON HCL 4 MG/2ML IJ SOLN
INTRAMUSCULAR | Status: DC | PRN
  Administered 2014-01-30: 4 mg via INTRAVENOUS

## 2014-01-30 MED ORDER — FENTANYL CITRATE 0.05 MG/ML IJ SOLN
25.0000 ug | INTRAMUSCULAR | Status: DC | PRN
  Administered 2014-01-30 (×2): 25 ug via INTRAVENOUS

## 2014-01-30 MED ORDER — SODIUM CHLORIDE 0.9 % IR SOLN
Status: DC | PRN
  Administered 2014-01-30: 1000 mL

## 2014-01-30 MED ORDER — SUCCINYLCHOLINE CHLORIDE 20 MG/ML IJ SOLN
INTRAMUSCULAR | Status: DC | PRN
  Administered 2014-01-30: 120 mg via INTRAVENOUS

## 2014-01-30 MED ORDER — FENTANYL CITRATE 0.05 MG/ML IJ SOLN
INTRAMUSCULAR | Status: AC
  Filled 2014-01-30: qty 5

## 2014-01-30 MED ORDER — PROPOFOL 10 MG/ML IV BOLUS
INTRAVENOUS | Status: AC
  Filled 2014-01-30: qty 20

## 2014-01-30 MED ORDER — HYDROMORPHONE HCL 1 MG/ML IJ SOLN
0.5000 mg | INTRAMUSCULAR | Status: DC | PRN
  Administered 2014-01-30 (×2): 1 mg via INTRAVENOUS
  Filled 2014-01-30 (×2): qty 1

## 2014-01-30 MED ORDER — CEFAZOLIN SODIUM-DEXTROSE 2-3 GM-% IV SOLR
INTRAVENOUS | Status: DC | PRN
  Administered 2014-01-30: 2 g via INTRAVENOUS

## 2014-01-30 SURGICAL SUPPLY — 58 items
APL SKNCLS STERI-STRIP NONHPOA (GAUZE/BANDAGES/DRESSINGS) ×1
BAG SPEC RTRVL 10 TROC 200 (ENDOMECHANICALS) ×1
BAG SPEC RTRVL LRG 6X4 10 (ENDOMECHANICALS)
BENZOIN TINCTURE PRP APPL 2/3 (GAUZE/BANDAGES/DRESSINGS) ×3 IMPLANT
CANISTER SUCTION 2500CC (MISCELLANEOUS) ×3 IMPLANT
CHLORAPREP W/TINT 26ML (MISCELLANEOUS) ×3 IMPLANT
CLIP LIGATING HEMO O LOK GREEN (MISCELLANEOUS) ×5 IMPLANT
CLOSURE WOUND 1/2 X4 (GAUZE/BANDAGES/DRESSINGS) ×1
COVER MAYO STAND STRL (DRAPES) IMPLANT
COVER SURGICAL LIGHT HANDLE (MISCELLANEOUS) ×3 IMPLANT
COVER TRANSDUCER ULTRASND (DRAPES) ×3 IMPLANT
DEVICE TROCAR PUNCTURE CLOSURE (ENDOMECHANICALS) ×3 IMPLANT
DISSECTOR BLUNT TIP ENDO 5MM (MISCELLANEOUS) ×2 IMPLANT
DRAPE C-ARM 42X72 X-RAY (DRAPES) IMPLANT
DRAPE LAPAROSCOPIC ABDOMINAL (DRAPES) ×3 IMPLANT
DRAPE UTILITY 15X26 W/TAPE STR (DRAPE) ×6 IMPLANT
ELECT REM PT RETURN 9FT ADLT (ELECTROSURGICAL) ×3
ELECTRODE REM PT RTRN 9FT ADLT (ELECTROSURGICAL) ×1 IMPLANT
GAUZE SPONGE 2X2 8PLY STRL LF (GAUZE/BANDAGES/DRESSINGS) ×1 IMPLANT
GLOVE BIO SURGEON STRL SZ 6.5 (GLOVE) ×1 IMPLANT
GLOVE BIO SURGEON STRL SZ7.5 (GLOVE) ×3 IMPLANT
GLOVE BIO SURGEON STRL SZ8 (GLOVE) ×2 IMPLANT
GLOVE BIO SURGEONS STRL SZ 6.5 (GLOVE) ×1
GLOVE BIOGEL PI IND STRL 7.0 (GLOVE) IMPLANT
GLOVE BIOGEL PI IND STRL 8 (GLOVE) IMPLANT
GLOVE BIOGEL PI INDICATOR 7.0 (GLOVE) ×2
GLOVE BIOGEL PI INDICATOR 8 (GLOVE) ×4
GLOVE SURG SS PI 7.0 STRL IVOR (GLOVE) ×2 IMPLANT
GOWN STRL REUS W/ TWL LRG LVL3 (GOWN DISPOSABLE) ×2 IMPLANT
GOWN STRL REUS W/ TWL XL LVL3 (GOWN DISPOSABLE) ×1 IMPLANT
GOWN STRL REUS W/TWL LRG LVL3 (GOWN DISPOSABLE) ×6
GOWN STRL REUS W/TWL XL LVL3 (GOWN DISPOSABLE) ×3
HEMOSTAT SNOW SURGICEL 2X4 (HEMOSTASIS) ×2 IMPLANT
IV CATH 14GX2 1/4 (CATHETERS) IMPLANT
KIT BASIN OR (CUSTOM PROCEDURE TRAY) ×3 IMPLANT
KIT ROOM TURNOVER OR (KITS) ×3 IMPLANT
NDL INSUFFLATION 14GA 120MM (NEEDLE) ×1 IMPLANT
NEEDLE INSUFFLATION 14GA 120MM (NEEDLE) ×3 IMPLANT
NS IRRIG 1000ML POUR BTL (IV SOLUTION) ×3 IMPLANT
PAD ARMBOARD 7.5X6 YLW CONV (MISCELLANEOUS) ×6 IMPLANT
POUCH RETRIEVAL ECOSAC 10 (ENDOMECHANICALS) IMPLANT
POUCH RETRIEVAL ECOSAC 10MM (ENDOMECHANICALS) ×2
POUCH SPECIMEN RETRIEVAL 10MM (ENDOMECHANICALS) IMPLANT
SCISSORS LAP 5X35 DISP (ENDOMECHANICALS) ×3 IMPLANT
SET CHOLANGIOGRAPHY FRANKLIN (SET/KITS/TRAYS/PACK) IMPLANT
SET IRRIG TUBING LAPAROSCOPIC (IRRIGATION / IRRIGATOR) ×3 IMPLANT
SLEEVE ENDOPATH XCEL 5M (ENDOMECHANICALS) ×7 IMPLANT
SPECIMEN JAR SMALL (MISCELLANEOUS) ×3 IMPLANT
SPONGE GAUZE 2X2 STER 10/PKG (GAUZE/BANDAGES/DRESSINGS) ×2
STRIP CLOSURE SKIN 1/2X4 (GAUZE/BANDAGES/DRESSINGS) ×1 IMPLANT
SUT MNCRL AB 3-0 PS2 18 (SUTURE) ×5 IMPLANT
TAPE CLOTH SURG 4X10 WHT LF (GAUZE/BANDAGES/DRESSINGS) ×2 IMPLANT
TOWEL OR 17X24 6PK STRL BLUE (TOWEL DISPOSABLE) ×3 IMPLANT
TOWEL OR 17X26 10 PK STRL BLUE (TOWEL DISPOSABLE) ×3 IMPLANT
TRAY LAPAROSCOPIC (CUSTOM PROCEDURE TRAY) ×3 IMPLANT
TROCAR XCEL NON-BLD 11X100MML (ENDOMECHANICALS) ×3 IMPLANT
TROCAR XCEL NON-BLD 5MMX100MML (ENDOMECHANICALS) ×3 IMPLANT
TUBING INSUFFLATION (TUBING) ×3 IMPLANT

## 2014-01-30 NOTE — Progress Notes (Signed)
Patient ID: Peter Mann  male  ATF:[id number]    DOB: [birth year]    DOA: 01/28/2014  PCP: Gaynelle Arabian, MD  Assessment/Plan: Principal Problem:   Abdominal pain with likely acute cholecystitis: Pain improving today, LFTs normal, lipase normal, poor historian - Cont NPO, HIDA scan + for ac cholecystitis, plan for cholecystectomy today  - general surgery following - Continue IV Rocephin  Active Problems:   Essential hypertension - Currently stable, continue Norvasc, metoprolol    Hyperglycemia with likely underlying diabetes mellitus - hemoglobin A1c 8.8, continue sliding scale insulin  BPH, history of prostate Ca- Continue Flomax  GERD -Continue PPI  ?dementia: confused overnight, looking for his car - sitter placed, minimize narcotics    DVT Prophylaxis: SCDs  Code Status: Full code  Family Communication:  Disposition:   Consultants:  Gen. surgery  Procedures:  None  Antibiotics:  IV Rocephin    Subjective: Patient seen and examined, was confused overnight  Objective: Weight change:   Intake/Output Summary (Last 24 hours) at 01/30/14 1159 Last data filed at 01/30/14 1059  Gross per 24 hour  Intake   3392 ml  Output    750 ml  Net   2642 ml   Blood pressure 156/71, pulse 105, temperature 99.1 F (37.3 C), temperature source Oral, resp. rate 23, height 5\' 7"  (1.702 m), weight 91.173 kg (201 lb), SpO2 98 %.  Physical Exam: General: Alert and awake, oriented to self CVS: S1-S2 clear, no murmur rubs or gallops Chest: CTAB  Abdomen: soft , ND, NBS, non tender Extremities: no c/c/e bilaterally   Lab Results: Basic Metabolic Panel:  Recent Labs Lab 01/29/14 0405 01/30/14 0545  NA 132* 133*  K 4.5 3.5*  CL 93* 96  CO2 25 21  GLUCOSE 148* 147*  BUN 11 18  CREATININE 1.07 1.21  CALCIUM 8.8 8.4   Liver Function Tests:  Recent Labs Lab 01/28/14 0211 01/29/14 0900  AST 19 15  ALT 18 12  ALKPHOS 131* 96  BILITOT 0.4 0.7    PROT 7.7 6.9  ALBUMIN 4.1 3.3*    Recent Labs Lab 01/28/14 0211  LIPASE 18   No results for input(s): AMMONIA in the last 168 hours. CBC:  Recent Labs Lab 01/28/14 0211 01/29/14 0405 01/30/14 0545  WBC 8.6 15.8* 14.7*  NEUTROABS 7.2  --   --   HGB 13.5 13.2 12.2*  HCT 41.3 39.5 36.8*  MCV 83.6 82.6 81.1  PLT 190 187 157   Cardiac Enzymes: No results for input(s): CKTOTAL, CKMB, CKMBINDEX, TROPONINI in the last 168 hours. BNP: Invalid input(s): POCBNP CBG:  Recent Labs Lab 01/29/14 1953 01/30/14 0011 01/30/14 0448 01/30/14 0755 01/30/14 1107  GLUCAP 146* 159* 156* 128* 156*     Micro Results: Recent Results (from the past 240 hour(s))  Blood culture (routine x 2)     Status: None (Preliminary result)   Collection Time: 01/28/14  5:30 AM  Result Value Ref Range Status   Specimen Description BLOOD LEFT HAND  Final   Special Requests BOTTLES DRAWN AEROBIC ONLY [facility]  Final   Culture  Setup Time   Final    01/28/2014 14:53 Performed at [facility]    Culture   Final           BLOOD CULTURE RECEIVED NO GROWTH TO DATE CULTURE WILL BE HELD FOR 5 DAYS BEFORE ISSUING A FINAL NEGATIVE REPORT Performed at [facility]    Report Status PENDING  Incomplete  Blood  culture (routine x 2)     Status: None (Preliminary result)   Collection Time: 01/28/14  5:38 AM  Result Value Ref Range Status   Specimen Description BLOOD LEFT ARM  Final   Special Requests BOTTLES DRAWN AEROBIC AND ANAEROBIC 10CC  Final   Culture  Setup Time   Final    01/28/2014 14:53 Performed at [facility]    Culture   Final           BLOOD CULTURE RECEIVED NO GROWTH TO DATE CULTURE WILL BE HELD FOR 5 DAYS BEFORE ISSUING A FINAL NEGATIVE REPORT Performed at [facility]    Report Status PENDING  Incomplete  Surgical pcr screen     Status: None   Collection Time: 01/28/14 11:20 AM  Result Value Ref Range Status   MRSA, PCR NEGATIVE NEGATIVE Final    Staphylococcus aureus NEGATIVE NEGATIVE Final    Comment:        The Xpert SA Assay (FDA approved for NASAL specimens in patients over 43 years of age), is one component of a comprehensive surveillance program.  Test performance has been validated by [employer] for patients greater than or equal to 57 year old. It is not intended to diagnose infection nor to guide or monitor treatment.     Studies/Results: Nm Hepatobiliary Including Gb  01/29/2014   CLINICAL DATA:  Cholelithiasis and clinical evidence of probable cholecystitis.  EXAM: NUCLEAR MEDICINE HEPATOBILIARY IMAGING  TECHNIQUE: Sequential images of the abdomen were obtained out to 60 minutes following intravenous administration of radiopharmaceutical.  RADIOPHARMACEUTICALS:  5.0 Millicurie ME-15A Choletec. During the procedure, the patient also received 3.7 mg of IV morphine.  COMPARISON:  Abdominal ultrasound on 01/28/2014 and CTA of the abdomen on 01/28/2014.  FINDINGS: Imaging demonstrates normal uptake of radiopharmaceutical by the liver and normal biliary excretion with prompt visualization of the small bowel. There is nonvisualization of the gallbladder over 60 min. After further administration of IV morphine, there is continued nonvisualization of the gallbladder over 30 min. Findings are suggestive of cholecystitis.  IMPRESSION: Positive hepatobiliary imaging demonstrating nonvisualization of the gallbladder prior to and following administration of IV morphine. Findings are consistent with cholecystitis.   Electronically Signed   By: Aletta Edouard M.D.   On: 01/29/2014 10:47   Ct Abdomen Pelvis W Contrast  01/21/2014   CLINICAL DATA:  Abdominal pain for 6 weeks  EXAM: CT ABDOMEN AND PELVIS WITH CONTRAST  TECHNIQUE: Multidetector CT imaging of the abdomen and pelvis was performed using the standard protocol following bolus administration of intravenous contrast.  CONTRAST:  147mL OMNIPAQUE IOHEXOL 300 MG/ML  SOLN  COMPARISON:   07/03/2006  FINDINGS: The lung bases are free of acute infiltrate or sizable effusion.  The liver is diffusely fatty infiltrated. The spleen, adrenal glands and pancreas are normal in their CT appearance. The gallbladder is well distended and demonstrates multiple small gallstones. No wall thickening or pericholecystic fluid is noted. Kidneys are well visualized bilaterally and demonstrate some renal cystic change in the upper pole on the right. No renal calculi or obstructive changes are seen.  The appendix is not well visualized although no inflammatory changes to suggest appendicitis are noted. 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 The bladder is well distended. The prostate indents upon the inferior aspect of the bladder. Degenerative changes of the lumbar spine are seen. No pelvic mass lesion is noted. Aortoiliac calcifications are noted without aneurysmal dilatation.  IMPRESSION: Cholelithiasis without acute abnormality.  No acute abnormality is noted.   Electronically Signed   By: Inez Catalina M.D.   On: 01/21/2014 07:51   US Abdomen Limited  01/28/2014   CLINICAL DATA:  Initial evaluation for right upper quadrant pain.  EXAM: US ABDOMEN LIMITED - RIGHT UPPER QUADRANT  COMPARISON:  Prior CT from 01/21/2014  FINDINGS: Gallbladder:  Multiple echogenic shadowing stones present within the gallbladder lumen measuring up to 1.3 cm in diameter. Several of these appeared adherent to the gallbladder wall. Probable gallbladder sludge present as well. Gallbladder wall minimally thickened to 4 mm. No free pericholecystic fluid. No sonographic Murphy sign elicited on exam.  Common bile duct:  Diameter: 7.6 mm, within normal limits for patient age. No intrahepatic biliary dilatation.  Liver:  No focal lesion identified. Within normal limits in parenchymal echogenicity.  IMPRESSION: 1. Stones with sludge within the gallbladder lumen with minimal gallbladder wall thickening. Several  of the stones appeared adherent to the gallbladder wall. No overt sonographic evidence for acute cholecystitis. 2. No biliary dilatation identified.   Electronically Signed   By: Jeannine Boga M.D.   On: 01/28/2014 03:58   Ct Angio Abd/pel W/ And/or W/o  01/28/2014   CLINICAL DATA:  In the extreme abdominal pain and abdominal distention. Nausea. Generalized abdominal pain.  EXAM: CTA ABDOMEN AND PELVIS wITHOUT AND WITH CONTRAST  TECHNIQUE: Multidetector CT imaging of the abdomen and pelvis was performed using the standard protocol during bolus administration of intravenous contrast. Multiplanar reconstructed images and MIPs were obtained and reviewed to evaluate the vascular anatomy.  CONTRAST:  178mL OMNIPAQUE IOHEXOL 350 MG/ML SOLN  COMPARISON:  Ultrasound abdomen 01/28/2014. CT abdomen and pelvis 01/21/2014.  FINDINGS: CT images obtained during the angiographic phase after contrast administration demonstrate diffuse calcification and mild mural thrombus/atherosclerotic changes in the abdominal aorta. No evidence of aneurysm or dissection. The abdominal aorta, celiac axis, superior mesenteric artery, single bilateral renal arteries, inferior mesenteric artery, and bilateral common iliac, external iliac, internal iliac, and common femoral arteries are patent.  Mild dependent changes in the lung bases. Small pericardial effusion or thickening. Thickening of the wall of the distal esophagus may indicate reflux esophagitis.  Cholelithiasis with gas in the gallbladder and mild gallbladder wall thickening. Appearance is suspicious for gangrenous cholecystitis. No bile duct dilatation. The liver, spleen, pancreas, adrenal glands, kidneys, inferior vena cava, and retroperitoneal lymph nodes appear unremarkable. Stomach is mildly distended with a fluid. No gastric wall thickening. Small bowel are decompressed. Diffusely stool-filled colon without distention. No free air or free fluid in the abdomen.  Pelvis:  Prostate gland is enlarged. Bladder wall is mildly thickened which may be due to bladder wall hypertrophy or infection. Correlate clinically for signs of cystitis. Diverticulosis of the sigmoid colon. No evidence of diverticulitis. The appendix is normal. No free or loculated pelvic fluid collections. No pelvic mass or lymphadenopathy. Degenerative changes in the lumbar spine. No destructive bone lesions appreciated.  Review of the MIP images confirms the above findings.  IMPRESSION: No evidence of abdominal aortic aneurysm or dissection. Abdominal aorta and major branch vessels are patent.  Cholelithiasis with gas in the gallbladder and mild gallbladder wall thickening worrisome for gangrenous cholecystitis.   Electronically Signed   By: Lucienne Capers M.D.   On: 01/28/2014 04:58    Medications: Scheduled Meds: . [MAR Hold] amLODipine  10 mg Oral Daily  . [MAR Hold] aspirin  81 mg Oral Daily  . [MAR Hold] cefTRIAXone (ROCEPHIN)  IV  2 g Intravenous Q24H  .  fentaNYL      . [MAR Hold] fluorometholone  1 drop Left Eye QID  . [MAR Hold] heparin  5,000 Units Subcutaneous 3 times per day  . [MAR Hold] insulin aspart  0-9 Units Subcutaneous 6 times per day  . [MAR Hold] metoprolol  50 mg Oral Daily  . [MAR Hold] olopatadine  1 drop Both Eyes Daily  . [MAR Hold] pantoprazole  40 mg Oral Daily  . [MAR Hold] tamsulosin  0.4 mg Oral QPC supper      LOS: 2 days   Zacary Bauer M.D. [facility] 01/30/2014, 11:59 AM Pager: [phone]  If 7PM-7AM, please contact night-coverage [email or web address] Password [email or web address]

## 2014-01-30 NOTE — Op Note (Addendum)
04/01/2013  10:52 AM  PATIENT:  Peter Mann  73 y.o. male  PRE-OPERATIVE DIAGNOSIS:  CHOLECYSTITIS  POST-OPERATIVE DIAGNOSIS:  CHOLECYSTITIS  PROCEDURE:  Procedure(s): LAPAROSCOPIC CHOLECYSTECTOMY (N/A)  SURGEON:  Surgeon(s) and Role:    * Ralene Ok, MD - Primary  PHYSICIAN ASSISTANT: Audelia Acton, PA-student ANESTHESIA:   local and general  EBL:  Total I/O In: 1000 [I.V.:1000] Out: 200 [Urine:200]  BLOOD ADMINISTERED:none  DRAINS: none   LOCAL MEDICATIONS USED:  BUPIVICAINE   SPECIMEN:  Source of Specimen:  gallbladder  DISPOSITION OF SPECIMEN:  PATHOLOGY  COUNTS:  YES  TOURNIQUET:  * No tourniquets in log *  DICTATION: .Dragon Dictation  Indications for procedure: Acute cholecystitis  Details of the procedure:  The patient was taken to the operating and placed in the supine position with bilateral SCDs in place.  The patient was prepped and draped in the usual sterile fashion. A time out was called and all facts were verified. A pneumoperitoneum was obtained via A Veress needle technique to a pressure of 12mm of mercury. A 66mm trochar was then placed in the right upper quadrant under visualization, and there were no injuries to any abdominal organs. A 11 mm port was then placed in the umbilical region after infiltrating with local anesthesia under direct visualization. A second and third epigastric port and right lower quadrant port placement under direct visualization, respectively. The gallbladder was identified and retracted, the peritoneum was then sharply dissected from the gallbladder and this dissection was carried down to Calot's triangle. To help with visualization a 3rd RUQ 79mm trochar was placed in direct visualization   The gallbladder was identified and stripped away circumferentially and seen going into the gallbladder 360. 2 clips were placed proximally one distally and the cystic duct transected. The cystic artery was identified and 2 clips  placed proximally and one distally and transected.  We then proceeded to remove the gallbladder off the hepatic fossa with Bovie cautery. A retrieval bag was then placed in the abdomen and gallbladder placed in the bag. The hepatic fossa was then reexamined and hemostasis was achieved with Bovie cautery and was excellent at the end of the case. A piece of surgicell snow was placed in the subhepatic fossa to help with hemostasis.  The subhepatic fossa and perihepatic fossa was then irrigated until the effluent was clear. The 11 mm trocar fascia was reapproximated with the Endo Close #1 Vicryl x2.  The pneumoperitoneum was evacuated and all trochars removed under direct visulalization.  The skin was then closed with 4-0 Monocryl and the skin dressed with Steri-Strips, gauze, and tape.  The patient was awaken from general anesthesia and taken to the recovery room in stable condition.   PLAN OF CARE: admitted  PATIENT DISPOSITION:  PACU - hemodynamically stable.   Delay start of Pharmacological VTE agent (>24hrs) due to surgical blood loss or risk of bleeding: not applicable

## 2014-01-30 NOTE — Anesthesia Postprocedure Evaluation (Signed)
  Anesthesia Post-op Note  Patient: Peter Mann  Procedure(s) Performed: Procedure(s): LAPAROSCOPIC CHOLECYSTECTOMY (N/A)  Patient Location: PACU  Anesthesia Type:General  Level of Consciousness: awake  Airway and Oxygen Therapy: Patient Spontanous Breathing  Post-op Pain: mild  Post-op Assessment: Post-op Vital signs reviewed  Post-op Vital Signs: Reviewed  Last Vitals:  Filed Vitals:   01/30/14 1209  BP:   Pulse:   Temp: 36.6 C  Resp:     Complications: No apparent anesthesia complications

## 2014-01-30 NOTE — Anesthesia Preprocedure Evaluation (Addendum)
Anesthesia Evaluation  Patient identified by MRN, date of birth, ID band Patient awake    Reviewed: Allergy & Precautions, H&P , Patient's Chart, lab work & pertinent test results  Airway Mallampati: I  TM Distance: >3 FB Neck ROM: Full    Dental  (+) Poor Dentition, Missing   Pulmonary neg pulmonary ROS, Current Smoker,  breath sounds clear to auscultation        Cardiovascular hypertension, Pt. on medications Rhythm:Regular Rate:Normal     Neuro/Psych    GI/Hepatic Neg liver ROS, GI history noted. CE   Endo/Other  negative endocrine ROSdiabetes  Renal/GU negative Renal ROS     Musculoskeletal   Abdominal   Peds  Hematology   Anesthesia Other Findings   Reproductive/Obstetrics                         Anesthesia Physical Anesthesia Plan  ASA: III  Anesthesia Plan: General   Post-op Pain Management:    Induction: Intravenous and Rapid sequence  Airway Management Planned: Oral ETT  Additional Equipment:   Intra-op Plan:   Post-operative Plan: Extubation in OR  Informed Consent: I have reviewed the patients History and Physical, chart, labs and discussed the procedure including the risks, benefits and alternatives for the proposed anesthesia with the patient or authorized representative who has indicated his/her understanding and acceptance.     Plan Discussed with: Anesthesiologist and Surgeon  Anesthesia Plan Comments:        Anesthesia Quick Evaluation

## 2014-01-30 NOTE — Progress Notes (Signed)
Temp 101.1 tylenol 650mg  given. MD notified, orders received.

## 2014-01-30 NOTE — Transfer of Care (Signed)
Immediate Anesthesia Transfer of Care Note  Patient: Peter Mann  Procedure(s) Performed: Procedure(s): LAPAROSCOPIC CHOLECYSTECTOMY (N/A)  Patient Location: PACU  Anesthesia Type:General  Level of Consciousness: awake  Airway & Oxygen Therapy: Patient Spontanous Breathing and Patient connected to nasal cannula oxygen  Post-op Assessment: Report given to PACU RN and Post -op Vital signs reviewed and stable  Post vital signs: Reviewed and stable  Complications: No apparent anesthesia complications

## 2014-01-30 NOTE — Progress Notes (Signed)
Documentation in this column done by Gary Fleet RN. --taking over as primary

## 2014-01-30 NOTE — Progress Notes (Addendum)
  Subjective: Minimal pain Clear mentation AOx4  Objective: Vital signs in last 24 hours: Temp:  [97.7 F (36.5 C)-98.4 F (36.9 C)] 97.9 F (36.6 C) (11/23 0834) Pulse Rate:  [88-106] 104 (11/23 0834) Resp:  [17-20] 17 (11/23 0834) BP: (124-156)/(64-95) 151/78 mmHg (11/23 0834) SpO2:  [92 %-98 %] 93 % (11/23 0834) Last BM Date: 01/27/14  Intake/Output from previous day: 11/22 0701 - 11/23 0700 In: 2255.3 [P.O.:660; I.V.:1170.3; IV Piggyback:425] Out: 750 [Urine:750] Intake/Output this shift: Total I/O In: -  Out: 200 [Urine:200]  General appearance: alert and cooperative Resp: clear to auscultation bilaterally Cardio: regular rate and rhythm, S1, S2 normal, no murmur, click, rub or gallop GI: soft, non-tender; bowel sounds normal; no masses,  no organomegaly  Lab Results:   Recent Labs  01/29/14 0405 01/30/14 0545  WBC 15.8* 14.7*  HGB 13.2 12.2*  HCT 39.5 36.8*  PLT 187 157   BMET  Recent Labs  01/29/14 0405 01/30/14 0545  NA 132* 133*  K 4.5 3.5*  CL 93* 96  CO2 25 21  GLUCOSE 148* 147*  BUN 11 18  CREATININE 1.07 1.21  CALCIUM 8.8 8.4   PT/INR No results for input(s): LABPROT, INR in the last 72 hours. ABG No results for input(s): PHART, HCO3 in the last 72 hours.  Invalid input(s): PCO2, PO2  Studies/Results: Nm Hepatobiliary Including Gb  01/29/2014   CLINICAL DATA:  Cholelithiasis and clinical evidence of probable cholecystitis.  EXAM: NUCLEAR MEDICINE HEPATOBILIARY IMAGING  TECHNIQUE: Sequential images of the abdomen were obtained out to 60 minutes following intravenous administration of radiopharmaceutical.  RADIOPHARMACEUTICALS:  5.0 [place] QQ-59D Choletec. During the procedure, the patient also received 3.7 mg of IV morphine.  COMPARISON:  Abdominal ultrasound on 01/28/2014 and CTA of the abdomen on 01/28/2014.  FINDINGS: Imaging demonstrates normal uptake of radiopharmaceutical by the liver and normal biliary excretion with prompt  visualization of the small bowel. There is nonvisualization of the gallbladder over 60 min. After further administration of IV morphine, there is continued nonvisualization of the gallbladder over 30 min. Findings are suggestive of cholecystitis.  IMPRESSION: Positive hepatobiliary imaging demonstrating nonvisualization of the gallbladder prior to and following administration of IV morphine. Findings are consistent with cholecystitis.   Electronically Signed   By: Aletta Edouard M.D.   On: 01/29/2014 10:47    Anti-infectives: Anti-infectives    Start     Dose/Rate Route Frequency Ordered Stop   01/28/14 1500  cefTRIAXone (ROCEPHIN) 2 g in dextrose 5 % 50 mL IVPB - Premix     2 g100 mL/hr over 30 Minutes Intravenous Every 24 hours 01/28/14 1350     01/28/14 0515  piperacillin-tazobactam (ZOSYN) IVPB 3.375 g     3.375 g100 mL/hr over 30 Minutes Intravenous  Once 01/28/14 0512 01/28/14 6387      Assessment/Plan: Acute cholecystitis  To OR for Lap chole All risks and benefits were discussed with the patient to generally include: infection, bleeding, possible need for post op ERCP, damage to the bile ducts, and bile leak. Alternatives were offered and described.  All questions were answered and the patient voiced understanding of the procedure and wishes to proceed at this point with a laparoscopic cholecystectomy   LOS: 2 days    Rosario Jacks., [facility] 01/30/2014

## 2014-01-30 NOTE — Care Management Note (Addendum)
  Page 1 of 1   02/06/2014     2:11:51 PM CARE MANAGEMENT NOTE 02/06/2014  Patient:  Peter Mann,Peter Mann   Account Number:  [id number]  Date Initiated:  01/30/2014  Documentation initiated by:  Magdalen Spatz  Subjective/Objective Assessment:     Action/Plan:   Anticipated DC Date:  02/06/2014   Anticipated DC Plan:  [place]         Choice offered to / List presented to:  C-1 Patient        [place] arranged  [place].   Status of service:  Completed, signed off Medicare Important Message given?  YES (If response is "NO", the following Medicare IM given date fields will be blank) Date Medicare IM given:  02/06/2014 Medicare IM given by:  Magdalen Spatz Date Additional Medicare IM given:  02/01/2014 Additional Medicare IM given by:  Magdalen Spatz  Discharge Disposition:    Per UR Regulation:    If discussed at Long Length of Stay Meetings, dates discussed:    Comments:  02-06-14 Patient refused walker and 3 in 1 . Magdalen Spatz RN BSN     02-06-14 Confirmed face sheet information , address correct . Phone number [phone]. Magdalen Spatz RN BSN [phone]

## 2014-01-31 ENCOUNTER — Encounter (HOSPITAL_COMMUNITY): Payer: Self-pay | Admitting: General Surgery

## 2014-01-31 ENCOUNTER — Inpatient Hospital Stay (HOSPITAL_COMMUNITY): Payer: PRIVATE HEALTH INSURANCE

## 2014-01-31 LAB — GLUCOSE, CAPILLARY
Glucose-Capillary: 156 mg/dL — ABNORMAL HIGH (ref 70–99)
Glucose-Capillary: 108 mg/dL — ABNORMAL HIGH (ref 70–99)
Glucose-Capillary: 193 mg/dL — ABNORMAL HIGH (ref 70–99)
Glucose-Capillary: 125 mg/dL — ABNORMAL HIGH (ref 70–99)
Glucose-Capillary: 140 mg/dL — ABNORMAL HIGH (ref 70–99)

## 2014-01-31 LAB — CBC
HCT: 34.6 % — ABNORMAL LOW (ref 39.0–52.0)
Hemoglobin: 11.3 g/dL — ABNORMAL LOW (ref 13.0–17.0)
MCH: 26.8 pg (ref 26.0–34.0)
MCHC: 32.7 g/dL (ref 30.0–36.0)
MCV: 82.2 fL (ref 78.0–100.0)
Platelets: 184 10*3/uL (ref 150–400)
RBC: 4.21 MIL/uL — ABNORMAL LOW (ref 4.22–5.81)
RDW: 14.9 % (ref 11.5–15.5)
WBC: 10.2 10*3/uL (ref 4.0–10.5)

## 2014-01-31 LAB — COMPREHENSIVE METABOLIC PANEL
ALT: 18 U/L (ref 0–53)
AST: 26 U/L (ref 0–37)
Albumin: 2.4 g/dL — ABNORMAL LOW (ref 3.5–5.2)
Alkaline Phosphatase: 81 U/L (ref 39–117)
Anion gap: 14 (ref 5–15)
BUN: 21 mg/dL (ref 6–23)
CO2: 23 mEq/L (ref 19–32)
Calcium: 8.3 mg/dL — ABNORMAL LOW (ref 8.4–10.5)
Chloride: 98 mEq/L (ref 96–112)
Creatinine, Ser: 1.46 mg/dL — ABNORMAL HIGH (ref 0.50–1.35)
GFR calc Af Amer: 53 mL/min — ABNORMAL LOW (ref >90)
GFR calc non Af Amer: 46 mL/min — ABNORMAL LOW (ref >90)
Glucose, Bld: 140 mg/dL — ABNORMAL HIGH (ref 70–99)
Potassium: 3.4 mEq/L — ABNORMAL LOW (ref 3.7–5.3)
Sodium: 135 mEq/L — ABNORMAL LOW (ref 137–147)
Total Bilirubin: 0.4 mg/dL (ref 0.3–1.2)
Total Protein: 6 g/dL (ref 6.0–8.3)

## 2014-01-31 LAB — HEMOGLOBIN A1C
Hgb A1c MFr Bld: 8.4 % — ABNORMAL HIGH (ref <5.7)
Mean Plasma Glucose: 194 mg/dL — ABNORMAL HIGH (ref <117)

## 2014-01-31 MED ORDER — SENNOSIDES-DOCUSATE SODIUM 8.6-50 MG PO TABS
1.0000 | ORAL_TABLET | Freq: Once | ORAL | Status: AC
  Administered 2014-01-31: 1 via ORAL
  Filled 2014-01-31: qty 1

## 2014-01-31 MED ORDER — HYDROCODONE-ACETAMINOPHEN 5-325 MG PO TABS: 1.0000 | ORAL_TABLET | Freq: Four times a day (QID) | ORAL | Status: DC | PRN

## 2014-01-31 MED ORDER — INSULIN ASPART 100 UNIT/ML ~~LOC~~ SOLN
0.0000 [IU] | Freq: Three times a day (TID) | SUBCUTANEOUS | Status: DC
  Administered 2014-01-31 – 2014-02-01 (×2): 1 [IU] via SUBCUTANEOUS
  Administered 2014-02-01: 2 [IU] via SUBCUTANEOUS
  Administered 2014-02-01: 1 [IU] via SUBCUTANEOUS
  Administered 2014-02-02 (×3): 2 [IU] via SUBCUTANEOUS
  Administered 2014-02-03 – 2014-02-04 (×4): 1 [IU] via SUBCUTANEOUS
  Administered 2014-02-05: 2 [IU] via SUBCUTANEOUS
  Administered 2014-02-06: 1 [IU] via SUBCUTANEOUS

## 2014-01-31 MED ORDER — LIVING WELL WITH DIABETES BOOK
Freq: Once | Status: AC
  Administered 2014-01-31: 13:00:00
  Filled 2014-01-31: qty 1

## 2014-01-31 MED ORDER — SENNOSIDES-DOCUSATE SODIUM 8.6-50 MG PO TABS: 1.0000 | ORAL_TABLET | Freq: Every day | ORAL | Status: DC

## 2014-01-31 MED ORDER — INSULIN ASPART 100 UNIT/ML ~~LOC~~ SOLN: 0.0000 [IU] | Freq: Every day | SUBCUTANEOUS | Status: DC

## 2014-01-31 MED ORDER — BISACODYL 10 MG RE SUPP
10.0000 mg | Freq: Once | RECTAL | Status: AC
  Administered 2014-01-31: 10 mg via RECTAL
  Filled 2014-01-31: qty 1

## 2014-01-31 MED ORDER — TRAMADOL HCL 50 MG PO TABS: 50.0000 mg | ORAL_TABLET | Freq: Two times a day (BID) | ORAL | Status: DC | PRN

## 2014-01-31 NOTE — Clinical Social Work Note (Signed)
Clinical Social Work Department BRIEF PSYCHOSOCIAL ASSESSMENT 01/31/2014  Patient:  Peter Mann,Peter Mann     Account Number:  [id number]     Admit date:  01/28/2014  Clinical Social Worker:  Domenica Reamer, [place]  Date/Time:  01/31/2014 02:45 PM  Referred by:  Physician  Date Referred:  01/31/2014 Referred for  SNF Placement   Other Referral:   Interview type:  Patient Other interview type:   RN, Dr.    Joya Gaskins DATA Living Status:  ALONE Admitted from facility:   Level of care:   Primary support name:  [name] Primary support relationship to patient:  CHILD, ADULT Degree of support available:   Patient reports that his daughter checks in over the phone often but lives in [place].  Patient reports that he does have a girlfriend who checks on him daily.    CURRENT CONCERNS Current Concerns  Post-Acute Placement   Other Concerns:   Nurse reports that there are some concerns that patient is confused from the daughter and the girlfriend.  Patient is able to answer questions appropriately but his speech is somewhat difficult to understand (thick accent vs slurring). Dr also has some concerns that patient experiences night time confusion that may be connected with diluatid dosing but also seems to present without dilutid. RN is also concerned for patient to drive- patient drove himself to the hospital and states that he his very capable of driving at this time.    SOCIAL WORK ASSESSMENT / PLAN CSW spoke with patient concerning PT recommendation for SNF.  Patient stated that he does not want to go to SNF- reported that he has been to a nursing home before and was also in foster care and says that he "would rather die" than go back to a facility.  CSW reiterated that the rehab stay would be temporary but patient stated that he is going home- patient was very concerning with food offered at rehab and mentioned multiple times how much he hates the food at facilities.  CSW  spoke with patient about how he feels about his physicial abilities to live alone currently and patient stated that he is capable of doing everything that he did prior to this admission and does not feel as if he needs rehab.   Assessment/plan status:  Psychosocial Support/Ongoing Assessment of Needs Other assessment/ plan:   none   Information/referral to community resources:    PATIENT'S/FAMILY'S RESPONSE TO PLAN OF CARE: Patient is not agreeable to PT recommendation for SNF and is hopeful that he will go home very soon.       Domenica Reamer, [place] Social Worker [phone]

## 2014-01-31 NOTE — Evaluation (Signed)
Physical Therapy Evaluation Patient Details Name: Peter Mann MRN: [id number] DOB: 1940-08-17 Today's Date: 01/31/2014   History of Present Illness  73 yo male with abd pain and received laparoscopic cholecystectomy and new dx DM.  Has medical management ongoing   Clinical Impression  Pt was seen for evaluation with a friend nearby who knows pt well.  His efforts were good to start moving but is unsafe to manage alone, and lives alone.  His plan is to consider a SNF which he mainly has concerns with about the food.  Reminded pt that the plan is temporary to get home safely.      Follow Up Recommendations SNF;Supervision/Assistance - 24 hour    Equipment Recommendations  Rolling walker with 5" wheels;3in1 (PT)    Recommendations for Other Services       Precautions / Restrictions Precautions Precautions: Fall Restrictions Weight Bearing Restrictions: No      Mobility  Bed Mobility Overal bed mobility: Needs Assistance Bed Mobility: Sidelying to Sit;Supine to Sit   Sidelying to sit: Mod assist Supine to sit: Mod assist     General bed mobility comments: HOB elevated and bedrailing used  Transfers Overall transfer level: Needs assistance Equipment used: Rolling walker (2 wheeled);1 person hand held assist Transfers: Sit to/from [facility] Sit to Stand: Mod assist (to power up) Stand pivot transfers: Min assist       General transfer comment: Pt starts off quickly iniitally after getting up to stand and talked to him about being careful and waiting to see if his head felt dizzy orbad.  Ambulation/Gait Ambulation/Gait assistance: Min assist Ambulation Distance (Feet): 100 Feet Assistive device: Rolling walker (2 wheeled) Gait Pattern/deviations: Step-through pattern;Decreased dorsiflexion - right;Decreased dorsiflexion - left;Wide base of support;Drifts right/left Gait velocity: reduced and occas stops Gait velocity interpretation: Below normal  speed for age/gender General Gait Details: tends to walk out of walker, reminders to clear obstacles  Stairs            Wheelchair Mobility    Modified Rankin (Stroke Patients Only)       Balance Overall balance assessment: Needs assistance Sitting-balance support: Feet supported Sitting balance-Leahy Scale: Fair Sitting balance - Comments: reminders for posture and hand placement Postural control: Posterior lean Standing balance support: Bilateral upper extremity supported Standing balance-Leahy Scale: Poor Standing balance comment: requries cues for safe use of RW with hands on grips of walker                             Pertinent Vitals/Pain Pain Assessment: 0-10 Pain Score: 7  Pain Location: abd Pain Descriptors / Indicators: Aching Pain Intervention(s): Limited activity within patient's tolerance;Monitored during session;Repositioned;Patient requesting pain meds-RN notified  BP was 136/74, pulse Ox 92-94% and pulse 91.    Home Living Family/patient expects to be discharged to:: Private residence Living Arrangements: Alone Available Help at Discharge: Friend(s) Type of Home: House Home Access: Stairs to enter Entrance Stairs-Rails: Left Entrance Stairs-Number of Steps: 2 Home Layout: One level Home Equipment: None Additional Comments: Was independent with all ADL's    Prior Function Level of Independence: Independent               Hand Dominance        Extremity/Trunk Assessment   Upper Extremity Assessment: Overall WFL for tasks assessed           Lower Extremity Assessment: Generalized weakness      Cervical /  Trunk Assessment: Normal  Communication   Communication: Expressive difficulties;Other (comment) (Speech is unclear and requires PT to ask him to repeat some )  Cognition Arousal/Alertness: Awake/alert Behavior During Therapy: Impulsive;WFL for tasks assessed/performed (alternately) Overall Cognitive Status: Within  Functional Limits for tasks assessed                      General Comments General comments (skin integrity, edema, etc.): Pt was seen for evaluation and is limited by endurance, safety and judgment to go home.  Talked with pt about maybe a short stay in SNF for recovery of his safe mobility.    Exercises        Assessment/Plan    PT Assessment Patient needs continued PT services  PT Diagnosis Difficulty walking   PT Problem List Decreased strength;Decreased range of motion;Decreased balance;Decreased activity tolerance;Decreased mobility;Decreased coordination;Decreased knowledge of use of DME;Decreased safety awareness;Obesity;Decreased skin integrity;Pain  PT Treatment Interventions DME instruction;Gait training;Stair training;Functional mobility training;Therapeutic activities;Therapeutic exercise;Balance training;Neuromuscular re-education;Patient/family education   PT Goals (Current goals can be found in the Care Plan section) Acute Rehab PT Goals Patient Stated Goal: to go home  PT Goal Formulation: With patient Time For Goal Achievement: 02/10/14 Potential to Achieve Goals: Good    Frequency Min 3X/week   Barriers to discharge Inaccessible home environment;Decreased caregiver support      Co-evaluation               End of Session Equipment Utilized During Treatment: Other (comment);Oxygen (FWW) Activity Tolerance: Patient limited by fatigue;Patient limited by pain Patient left: in chair;with call bell/phone within reach;with family/visitor present;with chair alarm set Nurse Communication: Mobility status;Patient requests pain meds         Time: 1201-1226 PT Time Calculation (min) (ACUTE ONLY): 25 min   Charges:   PT Evaluation $Initial PT Evaluation Tier I: 1 Procedure PT Treatments $Gait Training: 8-22 mins   PT G Codes:          Peter Mann 13-Feb-2014, 12:50 PM Peter Mann, PT MS Acute Rehab Dept. Number: [phone]

## 2014-01-31 NOTE — Discharge Instructions (Addendum)
You will be sent home on a medication for Diabetes Mellitus (sugar too high) called metformin.  Please take this once a day in the morning.  You will also go home on 4 more days of an antibiotic called Flagyl for a possible infection in your colon.  Please take this three times a day for 4 more days.  These were sent to your pharmacy.   LAPAROSCOPIC SURGERY: POST OP INSTRUCTIONS  1. DIET: Follow a light bland diet the first 24 hours after arrival home, such as soup, liquids, crackers, etc. Be sure to include lots of fluids daily. Avoid fast food or heavy meals as your are more likely to get nauseated. Eat a low fat the next few days after surgery.  2. Take your usually prescribed home medications unless otherwise directed. 3. PAIN CONTROL:  1. Pain is best controlled by a usual combination of three different methods TOGETHER:  1. Ice/Heat 2. Over the counter pain medication 3. Prescription pain medication 2. Most patients will experience some swelling and bruising around the incisions. Ice packs or heating pads (30-60 minutes up to 6 times a day) will help. Use ice for the first few days to help decrease swelling and bruising, then switch to heat to help relax tight/sore spots and speed recovery. Some people prefer to use ice alone, heat alone, alternating between ice & heat. Experiment to what works for you. Swelling and bruising can take several weeks to resolve.  3. It is helpful to take an over-the-counter pain medication regularly for the first few weeks. Choose one of the following that works best for you:  1. Naproxen (Aleve, etc) Two 220mg  tabs twice a day 2. Ibuprofen (Advil, etc) Three 200mg  tabs four times a day (every meal & bedtime) 3. Acetaminophen (Tylenol, etc) 500-650mg  four times a day (every meal & bedtime) 4. A prescription for pain medication (such as oxycodone, hydrocodone, etc) should be given to you upon discharge. Take your pain medication as prescribed.  1. If you are having  problems/concerns with the prescription medicine (does not control pain, nausea, vomiting, rash, itching, etc), please call us [phone] to see if we need to switch you to a different pain medicine that will work better for you and/or control your side effect better. 2. If you need a refill on your pain medication, please contact your pharmacy. They will contact our office to request authorization. Prescriptions will not be filled after 5 pm or on week-ends. 4. Avoid getting constipated. Between the surgery and the pain medications, it is common to experience some constipation. Increasing fluid intake and taking a fiber supplement (such as Metamucil, Citrucel, FiberCon, MiraLax, etc) 1-2 times a day regularly will usually help prevent this problem from occurring. A mild laxative (prune juice, Milk of Magnesia, MiraLax, etc) should be taken according to package directions if there are no bowel movements after 48 hours.  5. Watch out for diarrhea. If you have many loose bowel movements, simplify your diet to bland foods & liquids for a few days. Stop any stool softeners and decrease your fiber supplement. Switching to mild anti-diarrheal medications (Kayopectate, Pepto Bismol) can help. If this worsens or does not improve, please call us. 6. Wash / shower every day. You may shower over the dressings as they are waterproof. Continue to shower over incision(s) after the dressing is off. 7. Remove your waterproof bandages 5 days after surgery. You may leave the incision open to air. You may replace a dressing/Band-Aid to cover  the incision for comfort if you wish.  8. ACTIVITIES as tolerated:  1. You may resume regular (light) daily activities beginning the next day--such as daily self-care, walking, climbing stairs--gradually increasing activities as tolerated. If you can walk 30 minutes without difficulty, it is safe to try more intense activity such as jogging, treadmill, bicycling, low-impact aerobics,  swimming, etc. 2. Save the most intensive and strenuous activity for last such as sit-ups, heavy lifting, contact sports, etc Refrain from any heavy lifting or straining until you are off narcotics for pain control.  3. DO NOT PUSH THROUGH PAIN. Let pain be your guide: If it hurts to do something, don't do it. Pain is your body warning you to avoid that activity for another week until the pain goes down. 4. You may drive when you are no longer taking prescription pain medication, you can comfortably wear a seatbelt, and you can safely maneuver your car and apply brakes. 5. You may have sexual intercourse when it is comfortable.  9. FOLLOW UP in our office  1. Please call [facility] at [phone] to set up an appointment to see your surgeon in the office for a follow-up appointment approximately 2-3 weeks after your surgery. 2. Make sure that you call for this appointment the day you arrive home to insure a convenient appointment time.      10. IF YOU HAVE DISABILITY OR FAMILY LEAVE FORMS, BRING THEM TO THE               OFFICE FOR PROCESSING.   WHEN TO CALL us [phone]:  1. Poor pain control 2. Reactions / problems with new medications (rash/itching, nausea, etc)  3. Fever over 101.5 F (38.5 C) 4. Inability to urinate 5. Nausea and/or vomiting 6. Worsening swelling or bruising 7. Continued bleeding from incision. 8. Increased pain, redness, or drainage from the incision  The clinic staff is available to answer your questions during regular business hours (8:30am-5pm). Please dont hesitate to call and ask to speak to one of our nurses for clinical concerns.  If you have a medical emergency, go to the nearest emergency room or call 911.  A surgeon from [facility] is always on call at the [facility], [place], [place], [place], [place] [address] ?  MAIN: [phone] ? TOLL FREE: [phone] ?  FAX [phone]   [email or web address]

## 2014-01-31 NOTE — Progress Notes (Addendum)
Patient ID: Peter Mann  male  EVO:[id number]    DOB: 1940-08-24    DOA: 01/28/2014  PCP: Gaynelle Arabian, MD  Assessment/Plan: Principal Problem:   Abdominal pain with likely acute cholecystitis postop day 1 - Status post cholecystectomy, postop day 1 - General surgery following,, currently abdominal distention with bloating, no flatus yet - Placed on Dulcolax supp, Senokot, out of bed, PT  - general surgery following - Continue IV Rocephin  Active Problems:   Essential hypertension - Currently stable, continue Norvasc, metoprolol    Hyperglycemia with likely underlying diabetes mellitus - hemoglobin A1c 8.8, continue sliding scale insulin  BPH, history of prostate Ca- Continue Flomax  GERD -Continue PPI  Delirium: Improved - Does not need sitter  DVT Prophylaxis: SCDs  Code Status: Full code  Family Communication: 5:25pm Addendum, discussed in detail with patient's daughter on the phone, she thinks that her father may be confused and not able to make his decision regarding rehabilitation. She is concerned about him being alone and driving. Will have psych evaluate patient for decision-making capacity. If patient lacks decision-making capacity, his daughter requested for SNF. Psych consult called.  Disposition: Hopefully today if out of bed, ambulating and has BM   Consultants:  Gen. surgery  Procedures:  None  Antibiotics:  IV Rocephin    Subjective: Patient seen and examined, eating breakfast, abdominal distention with bloating, per patient not passing gas or had any BM yet  Objective: Weight change:   Intake/Output Summary (Last 24 hours) at 01/31/14 1210 Last data filed at 01/31/14 0948  Gross per 24 hour  Intake 2486.75 ml  Output   1200 ml  Net 1286.75 ml   Blood pressure 136/74, pulse 91, temperature 97.6 F (36.4 C), temperature source Oral, resp. rate 17, height 5\' 7"  (1.702 m), weight 91.173 kg (201 lb), SpO2 92 %.  Physical  Exam: General: Alert and awake, oriented  x3 CVS: S1-S2 clear, no murmur rubs or gallops Chest: CTAB  Abdomen: soft , abdominal distention, minimal tenderness  Extremities: no c/c/e bilaterally   Lab Results: Basic Metabolic Panel:  Recent Labs Lab 01/30/14 0545 01/31/14 0420  NA 133* 135*  K 3.5* 3.4*  CL 96 98  CO2 21 23  GLUCOSE 147* 140*  BUN 18 21  CREATININE 1.21 1.46*  CALCIUM 8.4 8.3*   Liver Function Tests:  Recent Labs Lab 01/29/14 0900 01/31/14 0420  AST 15 26  ALT 12 18  ALKPHOS 96 81  BILITOT 0.7 0.4  PROT 6.9 6.0  ALBUMIN 3.3* 2.4*    Recent Labs Lab 01/28/14 0211  LIPASE 18   No results for input(s): AMMONIA in the last 168 hours. CBC:  Recent Labs Lab 01/28/14 0211  01/30/14 0545 01/31/14 0420  WBC 8.6  < > 14.7* 10.2  NEUTROABS 7.2  --   --   --   HGB 13.5  < > 12.2* 11.3*  HCT 41.3  < > 36.8* 34.6*  MCV 83.6  < > 81.1 82.2  PLT 190  < > 157 184  < > = values in this interval not displayed. Cardiac Enzymes: No results for input(s): CKTOTAL, CKMB, CKMBINDEX, TROPONINI in the last 168 hours. BNP: Invalid input(s): POCBNP CBG:  Recent Labs Lab 01/30/14 1636 01/30/14 1924 01/30/14 2319 01/31/14 0356 01/31/14 0738  GLUCAP 142* 120* 156* 156* 108*     Micro Results: Recent Results (from the past 240 hour(s))  Blood culture (routine x 2)     Status: None (Preliminary  result)   Collection Time: 01/28/14  5:30 AM  Result Value Ref Range Status   Specimen Description BLOOD LEFT HAND  Final   Special Requests BOTTLES DRAWN AEROBIC ONLY [facility]  Final   Culture  Setup Time   Final    01/28/2014 14:53 Performed at [facility]    Culture   Final           BLOOD CULTURE RECEIVED NO GROWTH TO DATE CULTURE WILL BE HELD FOR 5 DAYS BEFORE ISSUING A FINAL NEGATIVE REPORT Performed at [facility]    Report Status PENDING  Incomplete  Blood culture (routine x 2)     Status: None (Preliminary result)   Collection  Time: 01/28/14  5:38 AM  Result Value Ref Range Status   Specimen Description BLOOD LEFT ARM  Final   Special Requests BOTTLES DRAWN AEROBIC AND ANAEROBIC 10CC  Final   Culture  Setup Time   Final    01/28/2014 14:53 Performed at [facility]    Culture   Final           BLOOD CULTURE RECEIVED NO GROWTH TO DATE CULTURE WILL BE HELD FOR 5 DAYS BEFORE ISSUING A FINAL NEGATIVE REPORT Performed at [facility]    Report Status PENDING  Incomplete  Surgical pcr screen     Status: None   Collection Time: 01/28/14 11:20 AM  Result Value Ref Range Status   MRSA, PCR NEGATIVE NEGATIVE Final   Staphylococcus aureus NEGATIVE NEGATIVE Final    Comment:        The Xpert SA Assay (FDA approved for NASAL specimens in patients over 38 years of age), is one component of a comprehensive surveillance program.  Test performance has been validated by [employer] for patients greater than or equal to 78 year old. It is not intended to diagnose infection nor to guide or monitor treatment.     Studies/Results: Nm Hepatobiliary Including Gb  01/29/2014   CLINICAL DATA:  Cholelithiasis and clinical evidence of probable cholecystitis.  EXAM: NUCLEAR MEDICINE HEPATOBILIARY IMAGING  TECHNIQUE: Sequential images of the abdomen were obtained out to 60 minutes following intravenous administration of radiopharmaceutical.  RADIOPHARMACEUTICALS:  5.0 Millicurie TO-67T Choletec. During the procedure, the patient also received 3.7 mg of IV morphine.  COMPARISON:  Abdominal ultrasound on 01/28/2014 and CTA of the abdomen on 01/28/2014.  FINDINGS: Imaging demonstrates normal uptake of radiopharmaceutical by the liver and normal biliary excretion with prompt visualization of the small bowel. There is nonvisualization of the gallbladder over 60 min. After further administration of IV morphine, there is continued nonvisualization of the gallbladder over 30 min. Findings are suggestive of cholecystitis.   IMPRESSION: Positive hepatobiliary imaging demonstrating nonvisualization of the gallbladder prior to and following administration of IV morphine. Findings are consistent with cholecystitis.   Electronically Signed   By: Aletta Edouard M.D.   On: 01/29/2014 10:47   Ct Abdomen Pelvis W Contrast  01/21/2014   CLINICAL DATA:  Abdominal pain for 6 weeks  EXAM: CT ABDOMEN AND PELVIS WITH CONTRAST  TECHNIQUE: Multidetector CT imaging of the abdomen and pelvis was performed using the standard protocol following bolus administration of intravenous contrast.  CONTRAST:  189mL OMNIPAQUE IOHEXOL 300 MG/ML  SOLN  COMPARISON:  07/03/2006  FINDINGS: The lung bases are free of acute infiltrate or sizable effusion.  The liver is diffusely fatty infiltrated. The spleen, adrenal glands and pancreas are normal in their CT appearance. The gallbladder is well distended and  demonstrates multiple small gallstones. No wall thickening or pericholecystic fluid is noted. Kidneys are well visualized bilaterally and demonstrate some renal cystic change in the upper pole on the right. No renal calculi or obstructive changes are seen.  The appendix is not well visualized although no inflammatory changes to suggest appendicitis are noted. 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 The bladder is well distended. The prostate indents upon the inferior aspect of the bladder. Degenerative changes of the lumbar spine are seen. No pelvic mass lesion is noted. Aortoiliac calcifications are noted without aneurysmal dilatation.  IMPRESSION: Cholelithiasis without acute abnormality.  No acute abnormality is noted.   Electronically Signed   By: Inez Catalina M.D.   On: 01/21/2014 07:51   US Abdomen Limited  01/28/2014   CLINICAL DATA:  Initial evaluation for right upper quadrant pain.  EXAM: US ABDOMEN LIMITED - RIGHT UPPER QUADRANT  COMPARISON:  Prior CT from 01/21/2014  FINDINGS: Gallbladder:  Multiple echogenic  shadowing stones present within the gallbladder lumen measuring up to 1.3 cm in diameter. Several of these appeared adherent to the gallbladder wall. Probable gallbladder sludge present as well. Gallbladder wall minimally thickened to 4 mm. No free pericholecystic fluid. No sonographic Murphy sign elicited on exam.  Common bile duct:  Diameter: 7.6 mm, within normal limits for patient age. No intrahepatic biliary dilatation.  Liver:  No focal lesion identified. Within normal limits in parenchymal echogenicity.  IMPRESSION: 1. Stones with sludge within the gallbladder lumen with minimal gallbladder wall thickening. Several of the stones appeared adherent to the gallbladder wall. No overt sonographic evidence for acute cholecystitis. 2. No biliary dilatation identified.   Electronically Signed   By: Jeannine Boga M.D.   On: 01/28/2014 03:58   Ct Angio Abd/pel W/ And/or W/o  01/28/2014   CLINICAL DATA:  In the extreme abdominal pain and abdominal distention. Nausea. Generalized abdominal pain.  EXAM: CTA ABDOMEN AND PELVIS wITHOUT AND WITH CONTRAST  TECHNIQUE: Multidetector CT imaging of the abdomen and pelvis was performed using the standard protocol during bolus administration of intravenous contrast. Multiplanar reconstructed images and MIPs were obtained and reviewed to evaluate the vascular anatomy.  CONTRAST:  163mL OMNIPAQUE IOHEXOL 350 MG/ML SOLN  COMPARISON:  Ultrasound abdomen 01/28/2014. CT abdomen and pelvis 01/21/2014.  FINDINGS: CT images obtained during the angiographic phase after contrast administration demonstrate diffuse calcification and mild mural thrombus/atherosclerotic changes in the abdominal aorta. No evidence of aneurysm or dissection. The abdominal aorta, celiac axis, superior mesenteric artery, single bilateral renal arteries, inferior mesenteric artery, and bilateral common iliac, external iliac, internal iliac, and common femoral arteries are patent.  Mild dependent changes in  the lung bases. Small pericardial effusion or thickening. Thickening of the wall of the distal esophagus may indicate reflux esophagitis.  Cholelithiasis with gas in the gallbladder and mild gallbladder wall thickening. Appearance is suspicious for gangrenous cholecystitis. No bile duct dilatation. The liver, spleen, pancreas, adrenal glands, kidneys, inferior vena cava, and retroperitoneal lymph nodes appear unremarkable. Stomach is mildly distended with a fluid. No gastric wall thickening. Small bowel are decompressed. Diffusely stool-filled colon without distention. No free air or free fluid in the abdomen.  Pelvis: Prostate gland is enlarged. Bladder wall is mildly thickened which may be due to bladder wall hypertrophy or infection. Correlate clinically for signs of cystitis. Diverticulosis of the sigmoid colon. No evidence of diverticulitis. The appendix is normal. No free or loculated pelvic fluid collections. No pelvic mass or lymphadenopathy. Degenerative changes  in the lumbar spine. No destructive bone lesions appreciated.  Review of the MIP images confirms the above findings.  IMPRESSION: No evidence of abdominal aortic aneurysm or dissection. Abdominal aorta and major branch vessels are patent.  Cholelithiasis with gas in the gallbladder and mild gallbladder wall thickening worrisome for gangrenous cholecystitis.   Electronically Signed   By: Lucienne Capers M.D.   On: 01/28/2014 04:58    Medications: Scheduled Meds: . amLODipine  10 mg Oral Daily  . aspirin  81 mg Oral Daily  . cefTRIAXone (ROCEPHIN)  IV  2 g Intravenous Q24H  . fluorometholone  1 drop Left Eye QID  . heparin  5,000 Units Subcutaneous 3 times per day  . insulin aspart  0-9 Units Subcutaneous 6 times per day  . living well with diabetes book   Does not apply Once  . metoprolol  50 mg Oral Daily  . olopatadine  1 drop Both Eyes Daily  . pantoprazole  40 mg Oral Daily  . tamsulosin  0.4 mg Oral QPC supper      LOS: 3  days   RAI,RIPUDEEP M.D. [facility] 01/31/2014, 12:10 PM Pager: [phone]  If 7PM-7AM, please contact night-coverage [email or web address] Password [email or web address]

## 2014-01-31 NOTE — Progress Notes (Signed)
[facility] Progress Note  1 Day Post-Op  Subjective: Pt doing well.  Says he feels bloated.  Hasn't been OOB yet.  Sitting up in the chair to eat breakfast.  No N/V.  No flatus yet.    Objective: Vital signs in last 24 hours: Temp:  [97.6 F (36.4 C)-101.1 F (38.4 C)] 97.6 F (36.4 C) (11/24 0505) Pulse Rate:  [91-113] 91 (11/24 0505) Resp:  [17-26] 17 (11/24 0505) BP: (131-156)/(71-87) 136/74 mmHg (11/24 0505) SpO2:  [88 %-98 %] 92 % (11/24 0505) Last BM Date: 01/27/14  Intake/Output from previous day: 11/23 0701 - 11/24 0700 In: 3846.8 [P.O.:478; I.V.:3268.8; IV Piggyback:100] Out: 1200 [Urine:1200] Intake/Output this shift: Total I/O In: -  Out: 200 [Urine:200]  PE: Gen:  Alert, NAD, pleasant Abd: Soft, distended, minimal tenderness, +BS, no HSM, incisions C/D/I   Lab Results:   Recent Labs  01/30/14 0545 01/31/14 0420  WBC 14.7* 10.2  HGB 12.2* 11.3*  HCT 36.8* 34.6*  PLT 157 184   BMET  Recent Labs  01/30/14 0545 01/31/14 0420  NA 133* 135*  K 3.5* 3.4*  CL 96 98  CO2 21 23  GLUCOSE 147* 140*  BUN 18 21  CREATININE 1.21 1.46*  CALCIUM 8.4 8.3*   PT/INR No results for input(s): LABPROT, INR in the last 72 hours. CMP     Component Value Date/Time   NA 135* 01/31/2014 0420   K 3.4* 01/31/2014 0420   CL 98 01/31/2014 0420   CO2 23 01/31/2014 0420   GLUCOSE 140* 01/31/2014 0420   BUN 21 01/31/2014 0420   CREATININE 1.46* 01/31/2014 0420   CALCIUM 8.3* 01/31/2014 0420   PROT 6.0 01/31/2014 0420   ALBUMIN 2.4* 01/31/2014 0420   AST 26 01/31/2014 0420   ALT 18 01/31/2014 0420   ALKPHOS 81 01/31/2014 0420   BILITOT 0.4 01/31/2014 0420   GFRNONAA 46* 01/31/2014 0420   GFRAA 53* 01/31/2014 0420   Lipase     Component Value Date/Time   LIPASE 18 01/28/2014 0211       Studies/Results: Nm Hepatobiliary Including Gb  01/29/2014   CLINICAL DATA:  Cholelithiasis and clinical evidence of probable cholecystitis.  EXAM:  NUCLEAR MEDICINE HEPATOBILIARY IMAGING  TECHNIQUE: Sequential images of the abdomen were obtained out to 60 minutes following intravenous administration of radiopharmaceutical.  RADIOPHARMACEUTICALS:  5.0 Millicurie ZO-10R Choletec. During the procedure, the patient also received 3.7 mg of IV morphine.  COMPARISON:  Abdominal ultrasound on 01/28/2014 and CTA of the abdomen on 01/28/2014.  FINDINGS: Imaging demonstrates normal uptake of radiopharmaceutical by the liver and normal biliary excretion with prompt visualization of the small bowel. There is nonvisualization of the gallbladder over 60 min. After further administration of IV morphine, there is continued nonvisualization of the gallbladder over 30 min. Findings are suggestive of cholecystitis.  IMPRESSION: Positive hepatobiliary imaging demonstrating nonvisualization of the gallbladder prior to and following administration of IV morphine. Findings are consistent with cholecystitis.   Electronically Signed   By: Aletta Edouard M.D.   On: 01/29/2014 10:47    Anti-infectives: Anti-infectives    Start     Dose/Rate Route Frequency Ordered Stop   01/28/14 1500  cefTRIAXone (ROCEPHIN) 2 g in dextrose 5 % 50 mL IVPB - Premix     2 g100 mL/hr over 30 Minutes Intravenous Every 24 hours 01/28/14 1350     01/28/14 0515  piperacillin-tazobactam (ZOSYN) IVPB 3.375 g     3.375 g100 mL/hr over 30 Minutes Intravenous  Once 01/28/14 8144 01/28/14 0629       Assessment/Plan POD #1 s/p lap chole for cholecystitis  Plan:  1.  Advance diet, orals for pain 2.  Ambulate and IS 3.  SCD's and  Heparin 4.  Should be able to discharge today when abdomen less distended, tolerating diet, and medically stable 5.  Follow up in [place] office 02/28/14 at 1:45pm, check in by 1:15pm.    LOS: 3 days    [name] 01/31/2014, 7:40 AM Pager: [phone]

## 2014-01-31 NOTE — Progress Notes (Signed)
Inpatient Diabetes Program Recommendations  AACE/ADA: New Consensus Statement on Inpatient Glycemic Control (2013)  Target Ranges:  Prepandial:   less than 140 mg/dL      Peak postprandial:   less than 180 mg/dL (1-2 hours)      Critically ill patients:  140 - 180 mg/dL   Reason for Assessment:  Spoke to patient regarding diabetes.  He states that his sugars have been up and that Dr. Warren Danes has told him to check his blood sugars once a day.  He says that they are up and down but that his Dr. Has said that he has not needed any medication in the past for his diabetes. Briefly discussed his elevated A1C and the potential need for medications for diabetes.  Encouraged him to follow-up with Dr. Warren Danes and discuss his elevated blood sugars and A1C.  It was difficult to communicate with patient however he seemed appropriate.  He seemed to know that he has diabetes however I am not sure if he has been on medications in the past for this.  Will need close follow-up with his PCP.  Left him material on A1C and standards of diabetes care.  Also ordered him "Living Well with Diabetes" booklet.  He plans to discharge today.  May benefit from outpatient diabetes education.  Will order per protocol.  Thanks, Adah Perl, RN, BC-ADM Inpatient Diabetes Coordinator Pager [phone]

## 2014-01-31 NOTE — Plan of Care (Signed)
Problem: Phase I Progression Outcomes Goal: Pain controlled with appropriate interventions Outcome: Completed/Met Date Met:  01/31/14 Goal: Voiding-avoid urinary catheter unless indicated Outcome: Completed/Met Date Met:  01/31/14 Goal: Hemodynamically stable Outcome: Completed/Met Date Met:  01/31/14

## 2014-02-01 DIAGNOSIS — F39 Unspecified mood [affective] disorder: Secondary | ICD-10-CM

## 2014-02-01 LAB — CBC
HCT: 35.6 % — ABNORMAL LOW (ref 39.0–52.0)
Hemoglobin: 11.6 g/dL — ABNORMAL LOW (ref 13.0–17.0)
MCH: 26.3 pg (ref 26.0–34.0)
MCHC: 32.6 g/dL (ref 30.0–36.0)
MCV: 80.7 fL (ref 78.0–100.0)
Platelets: 208 10*3/uL (ref 150–400)
RBC: 4.41 MIL/uL (ref 4.22–5.81)
RDW: 14.8 % (ref 11.5–15.5)
WBC: 9.1 10*3/uL (ref 4.0–10.5)

## 2014-02-01 LAB — GLUCOSE, CAPILLARY
Glucose-Capillary: 125 mg/dL — ABNORMAL HIGH (ref 70–99)
Glucose-Capillary: 166 mg/dL — ABNORMAL HIGH (ref 70–99)
Glucose-Capillary: 147 mg/dL — ABNORMAL HIGH (ref 70–99)
Glucose-Capillary: 128 mg/dL — ABNORMAL HIGH (ref 70–99)

## 2014-02-01 NOTE — Clinical Social Work Placement (Signed)
Clinical Social Work Department CLINICAL SOCIAL WORK PLACEMENT NOTE 02/01/2014  Patient:  Peter Mann,Peter Mann  Account Number:  [id number] Admit date:  01/28/2014  Clinical Social Worker:  Domenica Reamer, CLINICAL SOCIAL WORKER  Date/time:  02/01/2014 11:14 AM  Clinical Social Work is seeking post-discharge placement for this patient at the following level of care:   SKILLED NURSING   (*CSW will update this form in Epic as items are completed)   02/01/2014  Patient/family provided with [place] Department of Clinical Social Work's list of facilities offering this level of care within the geographic area requested by the patient (or if unable, by the patient's family).  02/01/2014  Patient/family informed of their freedom to choose among providers that offer the needed level of care, that participate in Medicare, Medicaid or managed care program needed by the patient, have an available bed and are willing to accept the patient.  02/01/2014  Patient/family informed of [facility]' ownership interest in [facility], as well as of the fact that they are under no obligation to receive care at this facility.  PASARR submitted to EDS on 02/01/2014 PASARR number received on 02/01/2014  FL2 transmitted to all facilities in geographic area requested by pt/family on  02/01/2014 FL2 transmitted to all facilities within larger geographic area on   Patient informed that his/her managed care company has contracts with or will negotiate with  certain facilities, including the following:     Patient/family informed of bed offers received:   Patient chooses bed at  Physician recommends and patient chooses bed at    Patient to be transferred to  on   Patient to be transferred to facility by  Patient and family notified of transfer on  Name of family member notified:    The following physician request were entered in Epic:   Additional Comments:  Patients daughter would like patient to  go to SNF- due to some patient confusion CSW will look for beds at this time.  Discharge disposition will be determined after psych evaluates patient for the capacity to make his own decisions  CSW will continue to follow.   Domenica Reamer, [place] Social Worker [phone]

## 2014-02-01 NOTE — Clinical Social Work Note (Addendum)
4:15pm  Psych has evaluated patient and determined that he is able to make his own decisions.  CSW informed case manager of decision.  Patient refuses SNF.  CSW signing off.   3:15pm  Patients daughter returned phone call.  Agreeable to search in [facility].  Patients daughter stated that she will be at the hospital on Friday to discuss bed offers.  Patients daughter also stated that she would like for [facility] to take the patient to SNF if the plan ends up being SNF.   2:00pm  CSW attempted to call patients daughter to discuss SNF.  Left message.  CSW will continue to follow.  Domenica Reamer, [place] Social Worker [phone]

## 2014-02-01 NOTE — Progress Notes (Signed)
Called patient's daughter to discuss the updates. Psych evaluation was done and per Dr Octavio Graves, patient has capacity to make decisions. Per repeat PT eval's, he still remains high risk for falls and needs 24/7 supervision. Per daughter, Ms Sharlett Iles, he lives alone and she cannot provide 24/7 supervision, she lives in [place]. She requested to continue with SNF search and she will be in touch with SW.    Peter Mann M.D. [name] 02/01/2014, 4:26 PM  Pager: [phone]

## 2014-02-01 NOTE — Progress Notes (Signed)
[facility] Progress Note  2 Days Post-Op  Subjective: Seems very out of it today.  No N/V, abdominal pain minimal.  Says he feels bloated.  Tolerating diet well.  Mobilized OOB yesterday, not sure he did yet today.    Objective: Vital signs in last 24 hours: Temp:  [97.8 F (36.6 C)-98.1 F (36.7 C)] 98 F (36.7 C) (11/25 0551) Pulse Rate:  [84-98] 90 (11/25 0551) Resp:  [18] 18 (11/25 0551) BP: (142-151)/(70-83) 145/80 mmHg (11/25 0551) SpO2:  [91 %-92 %] 91 % (11/25 0551) Last BM Date: 01/27/14  Intake/Output from previous day: 11/24 0701 - 11/25 0700 In: 240 [P.O.:240] Out: 200 [Urine:200] Intake/Output this shift:    PE: Gen:  Alert, NAD, pleasant Abd: Soft, distended but quite obese, NT, +BS, no HSM, incisions C/D/I   Lab Results:   Recent Labs  01/31/14 0420 02/01/14 0525  WBC 10.2 9.1  HGB 11.3* 11.6*  HCT 34.6* 35.6*  PLT 184 208   BMET  Recent Labs  01/30/14 0545 01/31/14 0420  NA 133* 135*  K 3.5* 3.4*  CL 96 98  CO2 21 23  GLUCOSE 147* 140*  BUN 18 21  CREATININE 1.21 1.46*  CALCIUM 8.4 8.3*   PT/INR No results for input(s): LABPROT, INR in the last 72 hours. CMP     Component Value Date/Time   NA 135* 01/31/2014 0420   K 3.4* 01/31/2014 0420   CL 98 01/31/2014 0420   CO2 23 01/31/2014 0420   GLUCOSE 140* 01/31/2014 0420   BUN 21 01/31/2014 0420   CREATININE 1.46* 01/31/2014 0420   CALCIUM 8.3* 01/31/2014 0420   PROT 6.0 01/31/2014 0420   ALBUMIN 2.4* 01/31/2014 0420   AST 26 01/31/2014 0420   ALT 18 01/31/2014 0420   ALKPHOS 81 01/31/2014 0420   BILITOT 0.4 01/31/2014 0420   GFRNONAA 46* 01/31/2014 0420   GFRAA 53* 01/31/2014 0420   Lipase     Component Value Date/Time   LIPASE 18 01/28/2014 0211       Studies/Results: Ct Head Wo Contrast  01/31/2014   CLINICAL DATA:  73 year old male with acute encephalopathy and confusion  EXAM: CT HEAD WITHOUT CONTRAST  TECHNIQUE: Contiguous axial images were  obtained from the base of the skull through the vertex without intravenous contrast.  COMPARISON:  Most recent prior MRI of the brain 03/28/2010  FINDINGS: Negative for acute intracranial hemorrhage, acute infarction, mass, mass effect, hydrocephalus or midline shift. Gray-white differentiation is preserved throughout. Generalized cerebral and cerebellar cortical atrophy. Mild periventricular hypoattenuation most consistent with chronic microvascular ischemic white matter disease. Focal hypoattenuation in the right thalamus suggests remote lacunar infarct. No focal soft tissue or calvarial abnormality. Globes and orbits are symmetric and intact bilaterally. Normal aeration of the mastoid air cells. Polyp versus mucous retention cyst in the inferior aspect of the left maxillary sinus. Mild mucoperiosteal thickening in the right sphenoid air cell and scattered left frontal ethmoid sinuses.  IMPRESSION: 1. No acute intracranial abnormality. 2. Atrophy, chronic microvascular ischemic white matter disease and remote right thalamic lacunar infarct versus dilated perivascular space without significant interval progression comparing across modalities to the brain MRI dated 03/28/2010. 3. Mild inflammatory paranasal sinus disease has a chronic appearance.   Electronically Signed   By: Jacqulynn Cadet M.D.   On: 01/31/2014 15:59    Anti-infectives: Anti-infectives    Start     Dose/Rate Route Frequency Ordered Stop   01/28/14 1500  cefTRIAXone (ROCEPHIN) 2 g in dextrose  5 % 50 mL IVPB - Premix     2 g100 mL/hr over 30 Minutes Intravenous Every 24 hours 01/28/14 1350     01/28/14 0515  piperacillin-tazobactam (ZOSYN) IVPB 3.375 g     3.375 g100 mL/hr over 30 Minutes Intravenous  Once 01/28/14 5929 01/28/14 0629       Assessment/Plan POD #2 s/p lap chole for cholecystitis  Plan:  1. Reg diet, orals for pain 2. Ambulate and IS 3. SCD's andheparin 4. Should be able to discharge from surgical standpoint  when abdomen less distended, tolerating diet, and medically stable.  Sounds like he may need to go to SNF, and psych consult for competency. 5. Agree with bowel regimen.  He did have a BM yesterday. 6.  Follow up in [place] office 02/28/14 at 1:45pm, check in by 1:15pm.    LOS: 4 days    [name] 02/01/2014, 9:40 AM Pager: [phone]

## 2014-02-01 NOTE — Progress Notes (Signed)
Nutrition Brief Note  Consult received for DM education for new onset MD.  Pt sleeping soundly. Woke pt who went back to sleep immediately.  Left education materials at bedside. Per review of chart pt lives alone, daughter concerned about pt's ability to make his own decisions. Psych consult pending with possible SNF placement at d/c.    Wt Readings from Last 15 Encounters:  01/28/14 201 lb (91.173 kg)  01/21/14 201 lb (91.173 kg)  03/11/13 203 lb (92.08 kg)  02/01/13 203 lb (92.08 kg)  06/08/11 210 lb (95.255 kg)    Body mass index is 31.47 kg/(m^2). Patient meets criteria for obesity class I based on current BMI.   Lab Results  Component Value Date   HGBA1C 8.4* 01/31/2014   Current diet order is CHO modifed, patient is consuming approximately 75% of meals at this time. Labs and medications reviewed.   Will follow up at more appropriate time to provide education.   [place], [place], [place] Pager [phone] After Hours Pager

## 2014-02-01 NOTE — Progress Notes (Signed)
Physical Therapy Treatment Patient Details Name: Peter Mann MRN: [id number] DOB: May 03, 1940 Today's Date: 02/01/2014    History of Present Illness 73 yo male with abd pain and received laparoscopic cholecystectomy and new dx DM.  Has medical management ongoing     PT Comments    Patient still continues to be impulsive with decreased safety awareness. Patient adamantly refusing SNF and want to go home alone. At this time PT continues with recommendation of SNF for patient safety and progression with therapy  Follow Up Recommendations  SNF;Supervision/Assistance - 24 hour     Equipment Recommendations  Rolling walker with 5" wheels;3in1 (PT)    Recommendations for Other Services       Precautions / Restrictions Precautions Precautions: Fall    Mobility  Bed Mobility Overal bed mobility: Needs Assistance       Supine to sit: Supervision     General bed mobility comments: Superivison for safety  Transfers Overall transfer level: Needs assistance Equipment used: Rolling walker (2 wheeled);1 person hand held assist   Sit to Stand: Min guard         General transfer comment: Patient needed cues for safety. Attempting to sit in chair with wheels without warning to PTA  Ambulation/Gait Ambulation/Gait assistance: Min guard Ambulation Distance (Feet): 120 Feet Assistive device: Rolling walker (2 wheeled) Gait Pattern/deviations: Step-through pattern;Decreased stride length;Trunk flexed     General Gait Details: Cues for upright posture and safety management of RW   Stairs            Wheelchair Mobility    Modified Rankin (Stroke Patients Only)       Balance                                    Cognition Arousal/Alertness: Awake/alert Behavior During Therapy: Impulsive;WFL for tasks assessed/performed Overall Cognitive Status: Within Functional Limits for tasks assessed                      Exercises      General  Comments        Pertinent Vitals/Pain Pain Score: 6  Pain Location: abd Pain Descriptors / Indicators: Aching Pain Intervention(s): Monitored during session    Home Living                      Prior Function            PT Goals (current goals can now be found in the care plan section) Progress towards PT goals: Progressing toward goals    Frequency  Min 3X/week    PT Plan Current plan remains appropriate    Co-evaluation             End of Session   Activity Tolerance: Patient tolerated treatment well Patient left: in chair;with call bell/phone within reach;with chair alarm set     Time: 1410-1426 PT Time Calculation (min) (ACUTE ONLY): 16 min  Charges:  $Gait Training: 8-22 mins                    G Codes:      Jacqualyn Posey 02/01/2014, 2:53 PM 02/01/2014 Jacqualyn Posey PTA [phone] pager [phone] office

## 2014-02-01 NOTE — Progress Notes (Signed)
Patient ID: Peter Mann  male  STM:[id number]    DOB: 16-Oct-1940    DOA: 01/28/2014  PCP: Gaynelle Arabian, MD  Assessment/Plan: Principal Problem:   Abdominal pain with likely acute cholecystitis postop day 2 - Status post cholecystectomy  - General surgery following, abdomen still distended, continue Dulcolax supp, Senokot, out of bed, PT  - Continue IV Rocephin  Active Problems:   Essential hypertension - Currently stable, continue Norvasc, metoprolol    Hyperglycemia with likely underlying diabetes mellitus - hemoglobin A1c 8.8, continue sliding scale insulin  BPH, history of prostate Ca- Continue Flomax  GERD -Continue PPI  Delirium and intermittent confusion -UA negative for UTI -CT head showed no acute intracranial abnormality, atrophy with chronic microvascular ischemic white matter disease and remote right thalamus lacunar infarct versus dilated perivascular , 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 Psych consult called for this decision-making capacity.  DVT Prophylaxis: SCDs  Code Status: Full code  Family Communication: Psych consult pending  Disposition:   Consultants:  Gen. surgery  Procedures:  Lap cholecystectomy  Antibiotics:  IV Rocephin    Subjective: Patient seen and examined, abdomen still distended however he did have BM yesterday   Objective: Weight change:   Intake/Output Summary (Last 24 hours) at 02/01/14 1240 Last data filed at 02/01/14 0930  Gross per 24 hour  Intake      0 ml  Output      0 ml  Net      0 ml   Blood pressure 145/80, pulse 90, temperature 98 F (36.7 C), temperature source Oral, resp. rate 18, height 5\' 7"  (1.702 m), weight 91.173 kg (201 lb), SpO2 91 %.  Physical Exam: General: Sleepy, easily arousable CVS: S1-S2 clear Chest: CTAB   Abdomen: soft , abdominal distention, nontender Extremities: no c/c/e bilaterally   Lab Results: Basic Metabolic Panel:  Recent Labs Lab 01/30/14 0545 01/31/14 0420  NA 133* 135*  K 3.5* 3.4*  CL 96 98  CO2 21 23  GLUCOSE 147* 140*  BUN 18 21  CREATININE 1.21 1.46*  CALCIUM 8.4 8.3*   Liver Function Tests:  Recent Labs Lab 01/29/14 0900 01/31/14 0420  AST 15 26  ALT 12 18  ALKPHOS 96 81  BILITOT 0.7 0.4  PROT 6.9 6.0  ALBUMIN 3.3* 2.4*    Recent Labs Lab 01/28/14 0211  LIPASE 18   No results for input(s): AMMONIA in the last 168 hours. CBC:  Recent Labs Lab 01/28/14 0211  01/31/14 0420 02/01/14 0525  WBC 8.6  < > 10.2 9.1  NEUTROABS 7.2  --   --   --   HGB 13.5  < > 11.3* 11.6*  HCT 41.3  < > 34.6* 35.6*  MCV 83.6  < > 82.2 80.7  PLT 190  < > 184 208  < > = values in this interval not displayed. Cardiac Enzymes: No results for input(s): CKTOTAL, CKMB, CKMBINDEX, TROPONINI in the last 168 hours. BNP: Invalid input(s): POCBNP CBG:  Recent Labs Lab 01/31/14 1203 01/31/14 1614 01/31/14 2122 02/01/14 0736 02/01/14 1151  GLUCAP 193* 125* 140* 125* 166*     Micro Results: Recent Results (from the past 240 hour(s))  Blood culture (routine x 2)     Status: None (Preliminary result)   Collection Time: 01/28/14  5:30 AM  Result Value Ref Range Status  Specimen Description BLOOD LEFT HAND  Final   Special Requests BOTTLES DRAWN AEROBIC ONLY [facility]  Final   Culture  Setup Time   Final    01/28/2014 14:53 Performed at [facility]    Culture   Final           BLOOD CULTURE RECEIVED NO GROWTH TO DATE CULTURE WILL BE HELD FOR 5 DAYS BEFORE ISSUING A FINAL NEGATIVE REPORT Performed at [facility]    Report Status PENDING  Incomplete  Blood culture (routine x 2)     Status: None (Preliminary result)   Collection Time: 01/28/14  5:38 AM  Result Value Ref Range Status   Specimen Description BLOOD LEFT ARM  Final   Special  Requests BOTTLES DRAWN AEROBIC AND ANAEROBIC 10CC  Final   Culture  Setup Time   Final    01/28/2014 14:53 Performed at [facility]    Culture   Final           BLOOD CULTURE RECEIVED NO GROWTH TO DATE CULTURE WILL BE HELD FOR 5 DAYS BEFORE ISSUING A FINAL NEGATIVE REPORT Performed at [facility]    Report Status PENDING  Incomplete  Surgical pcr screen     Status: None   Collection Time: 01/28/14 11:20 AM  Result Value Ref Range Status   MRSA, PCR NEGATIVE NEGATIVE Final   Staphylococcus aureus NEGATIVE NEGATIVE Final    Comment:        The Xpert SA Assay (FDA approved for NASAL specimens in patients over 62 years of age), is one component of a comprehensive surveillance program.  Test performance has been validated by [employer] for patients greater than or equal to 53 year old. It is not intended to diagnose infection nor to guide or monitor treatment.     Studies/Results: Ct Head Wo Contrast  01/31/2014   CLINICAL DATA:  73 year old male with acute encephalopathy and confusion  EXAM: CT HEAD WITHOUT CONTRAST  TECHNIQUE: Contiguous axial images were obtained from the base of the skull through the vertex without intravenous contrast.  COMPARISON:  Most recent prior MRI of the brain 03/28/2010  FINDINGS: Negative for acute intracranial hemorrhage, acute infarction, mass, mass effect, hydrocephalus or midline shift. Gray-white differentiation is preserved throughout. Generalized cerebral and cerebellar cortical atrophy. Mild periventricular hypoattenuation most consistent with chronic microvascular ischemic white matter disease. Focal hypoattenuation in the right thalamus suggests remote lacunar infarct. No focal soft tissue or calvarial abnormality. Globes and orbits are symmetric and intact bilaterally. Normal aeration of the mastoid air cells. Polyp versus mucous retention cyst in the inferior aspect of the left maxillary sinus. Mild mucoperiosteal thickening  in the right sphenoid air cell and scattered left frontal ethmoid sinuses.  IMPRESSION: 1. No acute intracranial abnormality. 2. Atrophy, chronic microvascular ischemic white matter disease and remote right thalamic lacunar infarct versus dilated perivascular space without significant interval progression comparing across modalities to the brain MRI dated 03/28/2010. 3. Mild inflammatory paranasal sinus disease has a chronic appearance.   Electronically Signed   By: Jacqulynn Cadet M.D.   On: 01/31/2014 15:59   Nm Hepatobiliary Including Gb  01/29/2014   CLINICAL DATA:  Cholelithiasis and clinical evidence of probable cholecystitis.  EXAM: NUCLEAR MEDICINE HEPATOBILIARY IMAGING  TECHNIQUE: Sequential images of the abdomen were obtained out to 60 minutes following intravenous administration of radiopharmaceutical.  RADIOPHARMACEUTICALS:  5.0 Millicurie XT-05W Choletec. During the procedure, the patient also received 3.7 mg of IV morphine.  COMPARISON:  Abdominal ultrasound on 01/28/2014 and CTA of the abdomen on 01/28/2014.  FINDINGS: Imaging demonstrates normal uptake of radiopharmaceutical by the liver and normal biliary excretion with prompt visualization of the small bowel. There is nonvisualization of the gallbladder over 60 min. After further administration of IV morphine, there is continued nonvisualization of the gallbladder over 30 min. Findings are suggestive of cholecystitis.  IMPRESSION: Positive hepatobiliary imaging demonstrating nonvisualization of the gallbladder prior to and following administration of IV morphine. Findings are consistent with cholecystitis.   Electronically Signed   By: Aletta Edouard M.D.   On: 01/29/2014 10:47   Ct Abdomen Pelvis W Contrast  01/21/2014   CLINICAL DATA:  Abdominal pain for 6 weeks  EXAM: CT ABDOMEN AND PELVIS WITH CONTRAST  TECHNIQUE: Multidetector CT imaging of the abdomen and pelvis was performed using the standard protocol following bolus  administration of intravenous contrast.  CONTRAST:  148mL OMNIPAQUE IOHEXOL 300 MG/ML  SOLN  COMPARISON:  07/03/2006  FINDINGS: The lung bases are free of acute infiltrate or sizable effusion.  The liver is diffusely fatty infiltrated. The spleen, adrenal glands and pancreas are normal in their CT appearance. The gallbladder is well distended and demonstrates multiple small gallstones. No wall thickening or pericholecystic fluid is noted. Kidneys are well visualized bilaterally and demonstrate some renal cystic change in the upper pole on the right. No renal calculi or obstructive changes are seen.  The appendix is not well visualized although no inflammatory changes to suggest appendicitis are noted. 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 The bladder is well distended. The prostate indents upon the inferior aspect of the bladder. Degenerative changes of the lumbar spine are seen. No pelvic mass lesion is noted. Aortoiliac calcifications are noted without aneurysmal dilatation.  IMPRESSION: Cholelithiasis without acute abnormality.  No acute abnormality is noted.   Electronically Signed   By: Inez Catalina M.D.   On: 01/21/2014 07:51   US Abdomen Limited  01/28/2014   CLINICAL DATA:  Initial evaluation for right upper quadrant pain.  EXAM: US ABDOMEN LIMITED - RIGHT UPPER QUADRANT  COMPARISON:  Prior CT from 01/21/2014  FINDINGS: Gallbladder:  Multiple echogenic shadowing stones present within the gallbladder lumen measuring up to 1.3 cm in diameter. Several of these appeared adherent to the gallbladder wall. Probable gallbladder sludge present as well. Gallbladder wall minimally thickened to 4 mm. No free pericholecystic fluid. No sonographic Murphy sign elicited on exam.  Common bile duct:  Diameter: 7.6 mm, within normal limits for patient age. No intrahepatic biliary dilatation.  Liver:  No focal lesion identified. Within normal limits in parenchymal echogenicity.   IMPRESSION: 1. Stones with sludge within the gallbladder lumen with minimal gallbladder wall thickening. Several of the stones appeared adherent to the gallbladder wall. No overt sonographic evidence for acute cholecystitis. 2. No biliary dilatation identified.   Electronically Signed   By: Jeannine Boga M.D.   On: 01/28/2014 03:58   Ct Angio Abd/pel W/ And/or W/o  01/28/2014   CLINICAL DATA:  In the extreme abdominal pain and abdominal distention. Nausea. Generalized abdominal pain.  EXAM: CTA ABDOMEN AND PELVIS wITHOUT AND WITH CONTRAST  TECHNIQUE: Multidetector CT imaging of the abdomen and pelvis was performed using the standard protocol during bolus administration of intravenous contrast. Multiplanar reconstructed images and MIPs were obtained and reviewed to evaluate the vascular anatomy.  CONTRAST:  165mL OMNIPAQUE IOHEXOL 350 MG/ML SOLN  COMPARISON:  Ultrasound abdomen 01/28/2014. CT abdomen and pelvis 01/21/2014.  FINDINGS:  CT images obtained during the angiographic phase after contrast administration demonstrate diffuse calcification and mild mural thrombus/atherosclerotic changes in the abdominal aorta. No evidence of aneurysm or dissection. The abdominal aorta, celiac axis, superior mesenteric artery, single bilateral renal arteries, inferior mesenteric artery, and bilateral common iliac, external iliac, internal iliac, and common femoral arteries are patent.  Mild dependent changes in the lung bases. Small pericardial effusion or thickening. Thickening of the wall of the distal esophagus may indicate reflux esophagitis.  Cholelithiasis with gas in the gallbladder and mild gallbladder wall thickening. Appearance is suspicious for gangrenous cholecystitis. No bile duct dilatation. The liver, spleen, pancreas, adrenal glands, kidneys, inferior vena cava, and retroperitoneal lymph nodes appear unremarkable. Stomach is mildly distended with a fluid. No gastric wall thickening. Small bowel are  decompressed. Diffusely stool-filled colon without distention. No free air or free fluid in the abdomen.  Pelvis: Prostate gland is enlarged. Bladder wall is mildly thickened which may be due to bladder wall hypertrophy or infection. Correlate clinically for signs of cystitis. Diverticulosis of the sigmoid colon. No evidence of diverticulitis. The appendix is normal. No free or loculated pelvic fluid collections. No pelvic mass or lymphadenopathy. Degenerative changes in the lumbar spine. No destructive bone lesions appreciated.  Review of the MIP images confirms the above findings.  IMPRESSION: No evidence of abdominal aortic aneurysm or dissection. Abdominal aorta and major branch vessels are patent.  Cholelithiasis with gas in the gallbladder and mild gallbladder wall thickening worrisome for gangrenous cholecystitis.   Electronically Signed   By: Lucienne Capers M.D.   On: 01/28/2014 04:58    Medications: Scheduled Meds: . amLODipine  10 mg Oral Daily  . aspirin  81 mg Oral Daily  . cefTRIAXone (ROCEPHIN)  IV  2 g Intravenous Q24H  . fluorometholone  1 drop Left Eye QID  . heparin  5,000 Units Subcutaneous 3 times per day  . insulin aspart  0-5 Units Subcutaneous QHS  . insulin aspart  0-9 Units Subcutaneous TID WC  . metoprolol  50 mg Oral Daily  . olopatadine  1 drop Both Eyes Daily  . pantoprazole  40 mg Oral Daily  . tamsulosin  0.4 mg Oral QPC supper      LOS: 4 days   Albino Bufford M.D. [facility] 02/01/2014, 12:40 PM Pager: [phone]  If 7PM-7AM, please contact night-coverage [email or web address] Password [email or web address]

## 2014-02-01 NOTE — Consult Note (Signed)
[facility] Face-to-Face Psychiatry Consult   Reason for Consult:  Capacity evaluation Referring Physician:  Dr. Alferd Patee is an 73 y.o. male. Total Time spent with patient: 45 minutes  Assessment: AXIS I:  Mood Disorder NOS AXIS II:  Deferred AXIS III:   Past Medical History  Diagnosis Date  . Dizziness   . Hypertension   . Hyperlipidemia   . History of tobacco abuse   . Chest pain   . Cancer     prostrate ca, radiation treatments   AXIS IV:  other psychosocial or environmental problems, problems related to social environment and problems with primary support group AXIS V:  61-70 mild symptoms  Plan:  Patient has capacity to make his own medical decisions and living arrangements Recommended no psychotropic medication management at this time Supportive therapy provided about ongoing stressors.  Appreciate psychiatric consultation Please contact [phone] or [phone] if needs further assistance   Subjective:   Peter Mann is a 73 y.o. male patient admitted with abdominal pain.  HPI:  Patient is a 73 year old male seen and chart reviewed for psychiatric evaluation and consultation of capacity evaluation. Patient appeared sitting in a chair next to his bed and eating his meal tray without distress. Patient reported he has been living by himself and has few people who support him. Patient reported he has been receiving medication management from his primary care physician's for diabetes, high blood pressure and cholesterol. Patient denies history of psychiatric illness or treatment at any time. 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 Patient reported he has 2 sons who are living and has limited communication. Patient denies symptoms of depression, anxiety, psychosis and paranoia. Patient has no suicidal or homicidal ideation. Patient has intact cognition including orientation, concentration, object identification, language and  fund of knowledge. Patient seems to be hard to understand because of his low education, age current medical conditions. Patient does not meet criteria for dementia or delirium at this time.  Medical history: Patient with history of hypertension, hyperlipidemia, vertigo, BPH presented to ED with abdominal pain. Patient reported that he has been having the abdominal pain for last 8 weeks, worsened in the last 1 week. He states pain is diffuse, 10/10, sharp and constant. Patient was seen in ED on 11/14 for abdominal pain as well, CT scan at that time showed cholelithiasis however was not felt to be symptomatic biliary colic and no evidence of cholecystitis. Patient was treated with tramadol and recommended to follow up with his PCP. Patient denies any vomiting, however states he has nausea, denies any diarrhea or constipation or chest pain, diaphoresis. Ultrasound showed cholelithiasis with borderline thickened gallbladder wall, CT showed possible gangrenous cholecystitis but no mesenteric ischemia. General surgery was consulted, recommended admission to medicine service.   Review of Systems:  Constitutional: Denies fever, chills, diaphoresis, poor appetite and fatigue.  HEENT: Denies photophobia, eye pain, redness, hearing loss, ear pain, congestion, sore throat, rhinorrhea, sneezing, mouth sores, trouble swallowing, neck pain, neck stiffness and tinnitus.  Respiratory: Denies SOB, DOE, cough, chest tightness, and wheezing.  Cardiovascular: Denies chest pain, palpitations and leg swelling.  Gastrointestinal: Please see history of present illness Genitourinary: Denies dysuria, urgency, frequency, hematuria, flank pain and difficulty urinating.  Musculoskeletal: Denies myalgias, back pain, joint swelling, arthralgias and gait problem.  Skin: Denies pallor, rash and wound.  Neurological: Denies dizziness, seizures, syncope, weakness, light-headedness, numbness and headaches. + occasionally has  vertigo Hematological: Denies adenopathy.  Easy bruising, personal or family bleeding history  Psychiatric/Behavioral: Denies suicidal ideation, mood changes, confusion, nervousness, sleep disturbance and agitation  Past Psychiatric History: Past Medical History  Diagnosis Date  . Dizziness   . Hypertension   . Hyperlipidemia   . History of tobacco abuse   . Chest pain   . Cancer     prostrate ca, radiation treatments    reports that he has been smoking.  He does not have any smokeless tobacco history on file. He reports that he does not drink alcohol or use illicit drugs. Family History  Problem Relation Age of Onset  . Cancer Brother   . Cancer Sister      Living Arrangements: Alone   Abuse/Neglect [facility]) Physical Abuse: Denies Verbal Abuse: Denies Sexual Abuse: Denies Allergies:  No Known Allergies  ACT Assessment Complete:  NO Objective: Blood pressure 145/80, pulse 90, temperature 98 F (36.7 C), temperature source Oral, resp. rate 18, height 5' 7"  (1.702 m), weight 91.173 kg (201 lb), SpO2 91 %.Body mass index is 31.47 kg/(m^2). Results for orders placed or performed during the hospital encounter of 01/28/14 (from the past 72 hour(s))  Glucose, capillary     Status: Abnormal   Collection Time: 01/29/14 11:42 AM  Result Value Ref Range   Glucose-Capillary 154 (H) 70 - 99 mg/dL  Glucose, capillary     Status: Abnormal   Collection Time: 01/29/14  3:53 PM  Result Value Ref Range   Glucose-Capillary 211 (H) 70 - 99 mg/dL  Glucose, capillary     Status: Abnormal   Collection Time: 01/29/14  7:53 PM  Result Value Ref Range   Glucose-Capillary 146 (H) 70 - 99 mg/dL   Comment 1 Notify RN    Comment 2 Documented in Chart   Glucose, capillary     Status: Abnormal   Collection Time: 01/30/14 12:11 AM  Result Value Ref Range   Glucose-Capillary 159 (H) 70 - 99 mg/dL   Comment 1 Notify RN    Comment 2 Documented in Chart   Glucose, capillary     Status: Abnormal    Collection Time: 01/30/14  4:48 AM  Result Value Ref Range   Glucose-Capillary 156 (H) 70 - 99 mg/dL   Comment 1 Notify RN    Comment 2 Documented in Chart   CBC     Status: Abnormal   Collection Time: 01/30/14  5:45 AM  Result Value Ref Range   WBC 14.7 (H) 4.0 - 10.5 K/uL   RBC 4.54 4.22 - 5.81 MIL/uL   Hemoglobin 12.2 (L) 13.0 - 17.0 g/dL   HCT 36.8 (L) 39.0 - 52.0 %   MCV 81.1 78.0 - 100.0 fL   MCH 26.9 26.0 - 34.0 pg   MCHC 33.2 30.0 - 36.0 g/dL   RDW 14.4 11.5 - 15.5 %   Platelets 157 150 - 400 K/uL  Basic metabolic panel     Status: Abnormal   Collection Time: 01/30/14  5:45 AM  Result Value Ref Range   Sodium 133 (L) 137 - 147 mEq/L   Potassium 3.5 (L) 3.7 - 5.3 mEq/L   Chloride 96 96 - 112 mEq/L   CO2 21 19 - 32 mEq/L   Glucose, Bld 147 (H) 70 - 99 mg/dL   BUN 18 6 - 23 mg/dL   Creatinine, Ser 1.21 0.50 - 1.35 mg/dL   Calcium 8.4 8.4 - 10.5 mg/dL   GFR calc non Af Amer 57 (L) >90 mL/min   GFR calc  Af Amer 66 (L) >90 mL/min    Comment: (NOTE) The eGFR has been calculated using the CKD EPI equation. This calculation has not been validated in all clinical situations. eGFR's persistently <90 mL/min signify possible Chronic Kidney Disease.    Anion gap 16 (H) 5 - 15  Glucose, capillary     Status: Abnormal   Collection Time: 01/30/14  7:55 AM  Result Value Ref Range   Glucose-Capillary 128 (H) 70 - 99 mg/dL  Glucose, capillary     Status: Abnormal   Collection Time: 01/30/14 11:07 AM  Result Value Ref Range   Glucose-Capillary 156 (H) 70 - 99 mg/dL   Comment 1 Notify RN   Glucose, capillary     Status: Abnormal   Collection Time: 01/30/14  4:36 PM  Result Value Ref Range   Glucose-Capillary 142 (H) 70 - 99 mg/dL  Glucose, capillary     Status: Abnormal   Collection Time: 01/30/14  7:24 PM  Result Value Ref Range   Glucose-Capillary 120 (H) 70 - 99 mg/dL   Comment 1 Notify RN   Glucose, capillary     Status: Abnormal   Collection Time: 01/30/14 11:19 PM   Result Value Ref Range   Glucose-Capillary 156 (H) 70 - 99 mg/dL   Comment 1 Notify RN   Glucose, capillary     Status: Abnormal   Collection Time: 01/31/14  3:56 AM  Result Value Ref Range   Glucose-Capillary 156 (H) 70 - 99 mg/dL   Comment 1 Notify RN   CBC     Status: Abnormal   Collection Time: 01/31/14  4:20 AM  Result Value Ref Range   WBC 10.2 4.0 - 10.5 K/uL   RBC 4.21 (L) 4.22 - 5.81 MIL/uL   Hemoglobin 11.3 (L) 13.0 - 17.0 g/dL   HCT 34.6 (L) 39.0 - 52.0 %   MCV 82.2 78.0 - 100.0 fL   MCH 26.8 26.0 - 34.0 pg   MCHC 32.7 30.0 - 36.0 g/dL   RDW 14.9 11.5 - 15.5 %   Platelets 184 150 - 400 K/uL  Comprehensive metabolic panel     Status: Abnormal   Collection Time: 01/31/14  4:20 AM  Result Value Ref Range   Sodium 135 (L) 137 - 147 mEq/L   Potassium 3.4 (L) 3.7 - 5.3 mEq/L   Chloride 98 96 - 112 mEq/L   CO2 23 19 - 32 mEq/L   Glucose, Bld 140 (H) 70 - 99 mg/dL   BUN 21 6 - 23 mg/dL   Creatinine, Ser 1.46 (H) 0.50 - 1.35 mg/dL   Calcium 8.3 (L) 8.4 - 10.5 mg/dL   Total Protein 6.0 6.0 - 8.3 g/dL   Albumin 2.4 (L) 3.5 - 5.2 g/dL   AST 26 0 - 37 U/L   ALT 18 0 - 53 U/L   Alkaline Phosphatase 81 39 - 117 U/L   Total Bilirubin 0.4 0.3 - 1.2 mg/dL   GFR calc non Af Amer 46 (L) >90 mL/min   GFR calc Af Amer 53 (L) >90 mL/min    Comment: (NOTE) The eGFR has been calculated using the CKD EPI equation. This calculation has not been validated in all clinical situations. eGFR's persistently <90 mL/min signify possible Chronic Kidney Disease.    Anion gap 14 5 - 15  Glucose, capillary     Status: Abnormal   Collection Time: 01/31/14  7:38 AM  Result Value Ref Range   Glucose-Capillary 108 (H) 70 - 99 mg/dL  Glucose, capillary     Status: Abnormal   Collection Time: 01/31/14 12:03 PM  Result Value Ref Range   Glucose-Capillary 193 (H) 70 - 99 mg/dL  Hemoglobin A1c     Status: Abnormal   Collection Time: 01/31/14  4:06 PM  Result Value Ref Range   Hgb A1c MFr Bld  8.4 (H) <5.7 %    Comment: (NOTE)                                                                       According to the ADA Clinical Practice Recommendations for 2011, when HbA1c is used as a screening test:  >=6.5%   Diagnostic of Diabetes Mellitus           (if abnormal result is confirmed) 5.7-6.4%   Increased risk of developing Diabetes Mellitus References:Diagnosis and Classification of Diabetes Mellitus,Diabetes BCWU,8891,69(IHWTU 1):S62-S69 and Standards of Medical Care in         Diabetes - 2011,Diabetes Care,2011,34 (Suppl 1):S11-S61.    Mean Plasma Glucose 194 (H) <117 mg/dL    Comment: Performed at [facility]  Glucose, capillary     Status: Abnormal   Collection Time: 01/31/14  4:14 PM  Result Value Ref Range   Glucose-Capillary 125 (H) 70 - 99 mg/dL  Glucose, capillary     Status: Abnormal   Collection Time: 01/31/14  9:22 PM  Result Value Ref Range   Glucose-Capillary 140 (H) 70 - 99 mg/dL   Comment 1 Notify RN   CBC     Status: Abnormal   Collection Time: 02/01/14  5:25 AM  Result Value Ref Range   WBC 9.1 4.0 - 10.5 K/uL   RBC 4.41 4.22 - 5.81 MIL/uL   Hemoglobin 11.6 (L) 13.0 - 17.0 g/dL   HCT 35.6 (L) 39.0 - 52.0 %   MCV 80.7 78.0 - 100.0 fL   MCH 26.3 26.0 - 34.0 pg   MCHC 32.6 30.0 - 36.0 g/dL   RDW 14.8 11.5 - 15.5 %   Platelets 208 150 - 400 K/uL  Glucose, capillary     Status: Abnormal   Collection Time: 02/01/14  7:36 AM  Result Value Ref Range   Glucose-Capillary 125 (H) 70 - 99 mg/dL   Labs are reviewed.  Current Facility-Administered Medications  Medication Dose Route Frequency Provider Last Rate Last Dose  . 0.9 %  sodium chloride infusion   Intravenous Continuous Ripudeep Krystal Eaton, MD   Stopped at 01/31/14 2200  . acetaminophen (TYLENOL) tablet 650 mg  650 mg Oral Q6H PRN Ripudeep Krystal Eaton, MD   650 mg at 01/31/14 2249   Or  . acetaminophen (TYLENOL) suppository 650 mg  650 mg Rectal Q6H PRN Ripudeep Krystal Eaton, MD      . ALPRAZolam Duanne Moron)  tablet 0.25 mg  0.25 mg Oral BID PRN Ripudeep Krystal Eaton, MD   0.25 mg at 01/31/14 2250  . alum & mag hydroxide-simeth (MAALOX/MYLANTA) 200-200-20 MG/5ML suspension 30 mL  30 mL Oral Q6H PRN Ripudeep K Rai, MD      . amLODipine (NORVASC) tablet 10 mg  10 mg Oral Daily Ripudeep K Rai, MD   10 mg at 02/01/14 1025  . aspirin chewable tablet 81 mg  81  mg Oral Daily Ripudeep Krystal Eaton, MD   81 mg at 02/01/14 1025  . cefTRIAXone (ROCEPHIN) 2 g in dextrose 5 % 50 mL IVPB - Premix  2 g Intravenous Q24H Saverio Danker, PA-C   2 g at 01/31/14 1506  . fluorometholone (FML) 0.1 % ophthalmic suspension 1 drop  1 drop Left Eye QID Ripudeep K Rai, MD   1 drop at 02/01/14 1028  . heparin injection 5,000 Units  5,000 Units Subcutaneous 3 times per day Saverio Danker, PA-C   5,000 Units at 02/01/14 0602  . insulin aspart (novoLOG) injection 0-5 Units  0-5 Units Subcutaneous QHS Ripudeep Krystal Eaton, MD   0 Units at 01/31/14 2235  . insulin aspart (novoLOG) injection 0-9 Units  0-9 Units Subcutaneous TID WC Ripudeep Krystal Eaton, MD   1 Units at 02/01/14 0850  . meclizine (ANTIVERT) tablet 25 mg  25 mg Oral TID PRN Ripudeep Krystal Eaton, MD   25 mg at 01/31/14 1005  . metoprolol (LOPRESSOR) tablet 50 mg  50 mg Oral Daily Ripudeep K Rai, MD   50 mg at 02/01/14 1025  . olopatadine (PATANOL) 0.1 % ophthalmic solution 1 drop  1 drop Both Eyes Daily Ripudeep K Rai, MD   1 drop at 02/01/14 1025  . ondansetron (ZOFRAN) tablet 4 mg  4 mg Oral Q6H PRN Ripudeep K Rai, MD       Or  . ondansetron (ZOFRAN) injection 4 mg  4 mg Intravenous Q6H PRN Ripudeep Krystal Eaton, MD   4 mg at 01/28/14 1109  . pantoprazole (PROTONIX) EC tablet 40 mg  40 mg Oral Daily Ripudeep K Rai, MD   40 mg at 02/01/14 1025  . polyvinyl alcohol (LIQUIFILM TEARS) 1.4 % ophthalmic solution 1 drop  1 drop Both Eyes TID PRN Ripudeep K Rai, MD      . tamsulosin (FLOMAX) capsule 0.4 mg  0.4 mg Oral QPC supper Ripudeep K Rai, MD   0.4 mg at 01/31/14 1807  . tiZANidine (ZANAFLEX) tablet 4 mg  4 mg  Oral Q6H PRN Ripudeep Krystal Eaton, MD        Psychiatric Specialty Exam: Physical Exam as per history and physical   ROS generalized weakness   Blood pressure 145/80, pulse 90, temperature 98 F (36.7 C), temperature source Oral, resp. rate 18, height 5' 7"  (1.702 m), weight 91.173 kg (201 lb), SpO2 91 %.Body mass index is 31.47 kg/(m^2).  General Appearance: Casual  Eye Contact::  Good  Speech:  Clear and Coherent  Volume:  Normal  Mood:  Euthymic  Affect:  Appropriate and Congruent  Thought Process:  Coherent and Goal Directed  Orientation:  Full (Time, Place, and Person)  Thought Content:  WDL  Suicidal Thoughts:  No  Homicidal Thoughts:  No  Memory:  Immediate;   Fair Recent;   Fair  Judgement:  Fair  Insight:  Fair  Psychomotor Activity:  Decreased  Concentration:  Fair  Recall:  Good  Fund of Knowledge:Good  Language: Fair  Akathisia:  NA  Handed:  Right  AIMS (if indicated):     Assets:  Communication Skills Desire for Improvement Financial Resources/Insurance Housing Leisure Time Resilience Social Support  Sleep:      Musculoskeletal: Strength & Muscle Tone: decreased Gait & Station: unable to stand Patient leans: N/A  Treatment Plan Summary: Daily contact with patient to assess and evaluate symptoms and progress in treatment Medication management  Toa Mia,JANARDHAHA R. 02/01/2014 11:10 AM

## 2014-02-02 LAB — GLUCOSE, CAPILLARY
Glucose-Capillary: 159 mg/dL — ABNORMAL HIGH (ref 70–99)
Glucose-Capillary: 167 mg/dL — ABNORMAL HIGH (ref 70–99)
Glucose-Capillary: 153 mg/dL — ABNORMAL HIGH (ref 70–99)
Glucose-Capillary: 166 mg/dL — ABNORMAL HIGH (ref 70–99)

## 2014-02-02 MED ORDER — DEXTROSE-NACL 5-0.45 % IV SOLN
INTRAVENOUS | Status: DC
  Administered 2014-02-02 – 2014-02-03 (×2): via INTRAVENOUS

## 2014-02-02 MED ORDER — SORBITOL 70 % SOLN
960.0000 mL | TOPICAL_OIL | Freq: Once | ORAL | Status: AC
  Administered 2014-02-02: 960 mL via RECTAL
  Filled 2014-02-02: qty 240

## 2014-02-02 NOTE — Progress Notes (Signed)
Vomited small amount of undigested food. zofran given

## 2014-02-02 NOTE — Progress Notes (Signed)
Patient ID: Peter Mann  male  NWG:[id number]    DOB: Apr 17, 1940    DOA: 01/28/2014  PCP: Gaynelle Arabian, MD  Assessment/Plan: Principal Problem:   Abdominal pain with likely acute cholecystitis postop day 3 - Status post cholecystectomy  -  Continue IV Rocephin, duration? 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  DVT Prophylaxis: SCDs  Code Status: Full code  Family Communication:Discussed in detail with patient's daughter  Disposition:Skilled nursing facility likely tomorrow if doing well   Consultants:  Gen. surgery  Procedures:  Lap cholecystectomy  Antibiotics:  IV Rocephin    Subjective: Patient seen and examined,abdomen distended and tight, constipated   Objective: Weight change:   Intake/Output Summary (Last 24 hours) at 02/02/14 1010 Last data filed at 02/02/14 0429  Gross per 24 hour  Intake    755 ml  Output    750 ml  Net      5 ml   Blood pressure 156/86, pulse 95, temperature 98.1 F (36.7 C), temperature source Oral, resp. rate 17, height 5\' 7"  (1.702 m), weight 91.173 kg (201 lb), SpO2 96 %.  Physical Exam: General:Alert and awake, slightly confused  CVS: S1-S2 clear Chest: CTAB  Abdomen: soft ,  abdominal distention, tight, hypoactive bowel sounds, nontender, dressing intact  Extremities: no c/c/e bilaterally   Lab Results: Basic Metabolic Panel:  Recent Labs Lab 01/30/14 0545 01/31/14 0420  NA 133* 135*  K 3.5* 3.4*  CL 96 98  CO2 21 23  GLUCOSE 147* 140*  BUN 18 21  CREATININE 1.21 1.46*  CALCIUM 8.4 8.3*   Liver Function Tests:  Recent Labs Lab 01/29/14 0900 01/31/14 0420  AST 15 26  ALT 12 18  ALKPHOS 96 81  BILITOT 0.7 0.4  PROT 6.9 6.0  ALBUMIN 3.3* 2.4*    Recent Labs Lab 01/28/14 0211  LIPASE 18   No results for input(s): AMMONIA in the last 168 hours. CBC:  Recent Labs Lab 01/28/14 0211  01/31/14 0420 02/01/14 0525  WBC 8.6  < > 10.2 9.1  NEUTROABS 7.2  --   --   --   HGB 13.5  < > 11.3* 11.6*  HCT 41.3  < > 34.6* 35.6*  MCV 83.6  < > 82.2 80.7  PLT 190  < > 184 208  < > = values in this interval not displayed. Cardiac Enzymes: No results for input(s): CKTOTAL, CKMB, CKMBINDEX, TROPONINI in the last 168 hours. BNP: Invalid input(s): POCBNP CBG:  Recent Labs Lab 02/01/14 0736 02/01/14 1151 02/01/14 1659 02/01/14 2120 02/02/14 0741  GLUCAP 125* 166* 147* 128* 159*     Micro Results: Recent Results (from the past 240 hour(s))  Blood culture (routine x 2)     Status: None (Preliminary result)   Collection Time: 01/28/14  5:30 AM  Result Value Ref Range Status   Specimen Description BLOOD LEFT  HAND  Final   Special Requests BOTTLES DRAWN AEROBIC ONLY [facility]  Final   Culture  Setup Time   Final    01/28/2014 14:53 Performed at [facility]    Culture   Final           BLOOD CULTURE RECEIVED NO GROWTH TO DATE CULTURE WILL BE HELD FOR 5 DAYS BEFORE ISSUING A FINAL NEGATIVE REPORT Performed at [facility]    Report Status PENDING  Incomplete  Blood culture (routine x 2)     Status: None (Preliminary result)   Collection Time: 01/28/14  5:38 AM  Result Value Ref Range Status   Specimen Description BLOOD  LEFT ARM  Final   Special Requests BOTTLES DRAWN AEROBIC AND ANAEROBIC 10CC  Final   Culture  Setup Time   Final    01/28/2014 14:53 Performed at [facility]    Culture   Final           BLOOD CULTURE RECEIVED NO GROWTH TO DATE CULTURE WILL BE HELD FOR 5 DAYS BEFORE ISSUING A FINAL NEGATIVE REPORT Performed at [facility]    Report Status PENDING  Incomplete  Surgical pcr screen     Status: None   Collection Time: 01/28/14 11:20 AM  Result Value Ref Range Status   MRSA, PCR NEGATIVE NEGATIVE Final   Staphylococcus aureus NEGATIVE NEGATIVE Final    Comment:        The Xpert SA Assay (FDA approved for NASAL specimens in patients over 56 years of age), is one component of a comprehensive surveillance program.  Test performance has been validated by [employer] for patients greater than or equal to 30 year old. It is not intended to diagnose infection nor to guide or monitor treatment.     Studies/Results: Ct Head Wo Contrast  01/31/2014   CLINICAL DATA:  73 year old male with acute encephalopathy and confusion  EXAM: CT HEAD WITHOUT CONTRAST  TECHNIQUE: Contiguous axial images were obtained from the base of the skull through the vertex without intravenous contrast.  COMPARISON:  Most recent prior MRI of the brain 03/28/2010  FINDINGS: Negative for acute intracranial hemorrhage, acute infarction, mass, mass effect, hydrocephalus or midline shift. Gray-white differentiation is preserved throughout. Generalized cerebral and cerebellar cortical atrophy. Mild periventricular hypoattenuation most consistent with chronic microvascular ischemic white matter disease. Focal hypoattenuation in the right thalamus suggests remote lacunar infarct. No focal soft tissue or calvarial abnormality. Globes and orbits are symmetric and intact bilaterally. Normal aeration of the mastoid air cells. Polyp versus mucous retention cyst in the inferior aspect of the left maxillary sinus. Mild  mucoperiosteal thickening in the right sphenoid air cell and scattered left frontal ethmoid sinuses.  IMPRESSION: 1. No acute intracranial abnormality. 2. Atrophy, chronic microvascular ischemic white matter disease and remote right thalamic lacunar infarct versus dilated perivascular space without significant interval progression comparing across modalities to the brain MRI dated 03/28/2010. 3. Mild inflammatory paranasal sinus disease has a chronic appearance.   Electronically Signed   By: Jacqulynn Cadet M.D.   On: 01/31/2014 15:59   Nm Hepatobiliary Including Gb  01/29/2014   CLINICAL DATA:  Cholelithiasis and clinical evidence of probable cholecystitis.  EXAM: NUCLEAR MEDICINE HEPATOBILIARY IMAGING  TECHNIQUE: Sequential images of the abdomen were obtained out to 60 minutes following intravenous administration of radiopharmaceutical.  RADIOPHARMACEUTICALS:  5.0 Millic[place]ie [id number] Choletec. During the procedure, the patient also received 3.7 mg of IV morphine.  COMPARISON:  Abdominal ultrasound on 01/28/2014  and CTA of the abdomen on 01/28/2014.  FINDINGS: Imaging demonstrates normal uptake of radiopharmaceutical by the liver and normal biliary excretion with prompt visualization of the small bowel. There is nonvisualization of the gallbladder over 60 min. After further administration of IV morphine, there is continued nonvisualization of the gallbladder over 30 min. Findings are suggestive of cholecystitis.  IMPRESSION: Positive hepatobiliary imaging demonstrating nonvisualization of the gallbladder prior to and following administration of IV morphine. Findings are consistent with cholecystitis.   Electronically Signed   By: Aletta Edouard M.D.   On: 01/29/2014 10:47   Ct Abdomen Pelvis W Contrast  01/21/2014   CLINICAL DATA:  Abdominal pain for 6 weeks  EXAM: CT ABDOMEN AND PELVIS WITH CONTRAST  TECHNIQUE: Multidetector CT imaging of the abdomen and pelvis was performed using the standard protocol  following bolus administration of intravenous contrast.  CONTRAST:  164mL OMNIPAQUE IOHEXOL 300 MG/ML  SOLN  COMPARISON:  07/03/2006  FINDINGS: The lung bases are free of acute infiltrate or sizable effusion.  The liver is diffusely fatty infiltrated. The spleen, adrenal glands and pancreas are normal in their CT appearance. The gallbladder is well distended and demonstrates multiple small gallstones. No wall thickening or pericholecystic fluid is noted. Kidneys are well visualized bilaterally and demonstrate some renal cystic change in the upper pole on the right. No renal calculi or obstructive changes are seen.  The appendix is not well visualized although no inflammatory changes to suggest appendicitis are noted. 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 The bladder is well distended. The prostate indents upon the inferior aspect of the bladder. Degenerative changes of the lumbar spine are seen. No pelvic mass lesion is noted. Aortoiliac calcifications are noted without aneurysmal dilatation.  IMPRESSION: Cholelithiasis without acute abnormality.  No acute abnormality is noted.   Electronically Signed   By: Inez Catalina M.D.   On: 01/21/2014 07:51   US Abdomen Limited  01/28/2014   CLINICAL DATA:  Initial evaluation for right upper quadrant pain.  EXAM: US ABDOMEN LIMITED - RIGHT UPPER QUADRANT  COMPARISON:  Prior CT from 01/21/2014  FINDINGS: Gallbladder:  Multiple echogenic shadowing stones present within the gallbladder lumen measuring up to 1.3 cm in diameter. Several of these appeared adherent to the gallbladder wall. Probable gallbladder sludge present as well. Gallbladder wall minimally thickened to 4 mm. No free pericholecystic fluid. No sonographic Murphy sign elicited on exam.  Common bile duct:  Diameter: 7.6 mm, within normal limits for patient age. No intrahepatic biliary dilatation.  Liver:  No focal lesion identified. Within normal limits in parenchymal  echogenicity.  IMPRESSION: 1. Stones with sludge within the gallbladder lumen with minimal gallbladder wall thickening. Several of the stones appeared adherent to the gallbladder wall. No overt sonographic evidence for acute cholecystitis. 2. No biliary dilatation identified.   Electronically Signed   By: Jeannine Boga M.D.   On: 01/28/2014 03:58   Ct Angio Abd/pel W/ And/or W/o  01/28/2014   CLINICAL DATA:  In the extreme abdominal pain and abdominal distention. Nausea. Generalized abdominal pain.  EXAM: CTA ABDOMEN AND PELVIS wITHOUT AND WITH CONTRAST  TECHNIQUE: Multidetector CT imaging of the abdomen and pelvis was performed using the standard protocol during bolus administration of intravenous contrast. Multiplanar reconstructed images and MIPs were obtained and reviewed to evaluate the vascular anatomy.  CONTRAST:  139mL OMNIPAQUE IOHEXOL 350 MG/ML SOLN  COMPARISON:  Ultrasound abdomen 01/28/2014. CT abdomen and pelvis 01/21/2014.  FINDINGS: CT images obtained during  the angiographic phase after contrast administration demonstrate diffuse calcification and mild mural thrombus/atherosclerotic changes in the abdominal aorta. No evidence of aneurysm or dissection. The abdominal aorta, celiac axis, superior mesenteric artery, single bilateral renal arteries, inferior mesenteric artery, and bilateral common iliac, external iliac, internal iliac, and common femoral arteries are patent.  Mild dependent changes in the lung bases. Small pericardial effusion or thickening. Thickening of the wall of the distal esophagus may indicate reflux esophagitis.  Cholelithiasis with gas in the gallbladder and mild gallbladder wall thickening. Appearance is suspicious for gangrenous cholecystitis. No bile duct dilatation. The liver, spleen, pancreas, adrenal glands, kidneys, inferior vena cava, and retroperitoneal lymph nodes appear unremarkable. Stomach is mildly distended with a fluid. No gastric wall thickening. Small  bowel are decompressed. Diffusely stool-filled colon without distention. No free air or free fluid in the abdomen.  Pelvis: Prostate gland is enlarged. Bladder wall is mildly thickened which may be due to bladder wall hypertrophy or infection. Correlate clinically for signs of cystitis. Diverticulosis of the sigmoid colon. No evidence of diverticulitis. The appendix is normal. No free or loculated pelvic fluid collections. No pelvic mass or lymphadenopathy. Degenerative changes in the lumbar spine. No destructive bone lesions appreciated.  Review of the MIP images confirms the above findings.  IMPRESSION: No evidence of abdominal aortic aneurysm or dissection. Abdominal aorta and major branch vessels are patent.  Cholelithiasis with gas in the gallbladder and mild gallbladder wall thickening worrisome for gangrenous cholecystitis.   Electronically Signed   By: Lucienne Capers M.D.   On: 01/28/2014 04:58    Medications: Scheduled Meds: . amLODipine  10 mg Oral Daily  . aspirin  81 mg Oral Daily  . cefTRIAXone (ROCEPHIN)  IV  2 g Intravenous Q24H  . fluorometholone  1 drop Left Eye QID  . heparin  5,000 Units Subcutaneous 3 times per day  . insulin aspart  0-5 Units Subcutaneous QHS  . insulin aspart  0-9 Units Subcutaneous TID WC  . metoprolol  50 mg Oral Daily  . olopatadine  1 drop Both Eyes Daily  . pantoprazole  40 mg Oral Daily  . sorbitol, milk of mag, mineral oil, glycerin (SMOG) enema  960 mL Rectal Once  . tamsulosin  0.4 mg Oral QPC supper      LOS: 5 days   RAI,RIPUDEEP M.D. [facility] 02/02/2014, 10:10 AM Pager: [phone]  If 7PM-7AM, please contact night-coverage [email or web address] Password [email or web address]

## 2014-02-02 NOTE — Progress Notes (Signed)
Patient ID: Peter Mann, male   DOB: [birth year], 73 y.o.   MRN: [id number] [facility] Surgery Progress Note:   3 Days Post-Op  Subjective: Mental status is confused.  Talkative.   Objective: Vital signs in last 24 hours: Temp:  [97.8 F (36.6 C)-99.3 F (37.4 C)] 98.1 F (36.7 C) (11/26 0532) Pulse Rate:  [87-102] 95 (11/26 0532) Resp:  [16-18] 17 (11/26 0532) BP: (148-156)/(70-90) 156/86 mmHg (11/26 0532) SpO2:  [94 %-98 %] 96 % (11/26 0532)  Intake/Output from previous day: 11/25 0701 - 11/26 0700 In: 755 [P.O.:755] Out: 750 [Urine:750] Intake/Output this shift:    Physical Exam: Work of breathing is not labored.  Sitting before a plate of breakfast foods.  Incisions OK.    Lab Results:  Results for orders placed or performed during the hospital encounter of 01/28/14 (from the past 48 hour(s))  Glucose, capillary     Status: Abnormal   Collection Time: 01/31/14 12:03 PM  Result Value Ref Range   Glucose-Capillary 193 (H) 70 - 99 mg/dL  Hemoglobin A1c     Status: Abnormal   Collection Time: 01/31/14  4:06 PM  Result Value Ref Range   Hgb A1c MFr Bld 8.4 (H) <5.7 %    Comment: (NOTE)                                                                       According to the ADA Clinical Practice Recommendations for 2011, when HbA1c is used as a screening test:  >=6.5%   Diagnostic of Diabetes Mellitus           (if abnormal result is confirmed) 5.7-6.4%   Increased risk of developing Diabetes Mellitus References:Diagnosis and Classification of Diabetes Mellitus,Diabetes UTML,4650,35(WSFKC 1):S62-S69 and Standards of Medical Care in         Diabetes - 2011,Diabetes Care,2011,34 (Suppl 1):S11-S61.    Mean Plasma Glucose 194 (H) <117 mg/dL    Comment: Performed at [facility]  Glucose, capillary     Status: Abnormal   Collection Time: 01/31/14  4:14 PM  Result Value Ref Range   Glucose-Capillary 125 (H) 70 - 99 mg/dL  Glucose, capillary     Status: Abnormal    Collection Time: 01/31/14  9:22 PM  Result Value Ref Range   Glucose-Capillary 140 (H) 70 - 99 mg/dL   Comment 1 Notify RN   CBC     Status: Abnormal   Collection Time: 02/01/14  5:25 AM  Result Value Ref Range   WBC 9.1 4.0 - 10.5 K/uL   RBC 4.41 4.22 - 5.81 MIL/uL   Hemoglobin 11.6 (L) 13.0 - 17.0 g/dL   HCT 35.6 (L) 39.0 - 52.0 %   MCV 80.7 78.0 - 100.0 fL   MCH 26.3 26.0 - 34.0 pg   MCHC 32.6 30.0 - 36.0 g/dL   RDW 14.8 11.5 - 15.5 %   Platelets 208 150 - 400 K/uL  Glucose, capillary     Status: Abnormal   Collection Time: 02/01/14  7:36 AM  Result Value Ref Range   Glucose-Capillary 125 (H) 70 - 99 mg/dL  Glucose, capillary     Status: Abnormal   Collection Time: 02/01/14 11:51 AM  Result Value Ref Range  Glucose-Capillary 166 (H) 70 - 99 mg/dL  Glucose, capillary     Status: Abnormal   Collection Time: 02/01/14  4:59 PM  Result Value Ref Range   Glucose-Capillary 147 (H) 70 - 99 mg/dL  Glucose, capillary     Status: Abnormal   Collection Time: 02/01/14  9:20 PM  Result Value Ref Range   Glucose-Capillary 128 (H) 70 - 99 mg/dL  Glucose, capillary     Status: Abnormal   Collection Time: 02/02/14  7:41 AM  Result Value Ref Range   Glucose-Capillary 159 (H) 70 - 99 mg/dL    Radiology/Results: Ct Head Wo Contrast  01/31/2014   CLINICAL DATA:  73 year old male with acute encephalopathy and confusion  EXAM: CT HEAD WITHOUT CONTRAST  TECHNIQUE: Contiguous axial images were obtained from the base of the skull through the vertex without intravenous contrast.  COMPARISON:  Most recent prior MRI of the brain 03/28/2010  FINDINGS: Negative for acute intracranial hemorrhage, acute infarction, mass, mass effect, hydrocephalus or midline shift. Gray-white differentiation is preserved throughout. Generalized cerebral and cerebellar cortical atrophy. Mild periventricular hypoattenuation most consistent with chronic microvascular ischemic white matter disease. Focal hypoattenuation in  the right thalamus suggests remote lacunar infarct. No focal soft tissue or calvarial abnormality. Globes and orbits are symmetric and intact bilaterally. Normal aeration of the mastoid air cells. Polyp versus mucous retention cyst in the inferior aspect of the left maxillary sinus. Mild mucoperiosteal thickening in the right sphenoid air cell and scattered left frontal ethmoid sinuses.  IMPRESSION: 1. No acute intracranial abnormality. 2. Atrophy, chronic microvascular ischemic white matter disease and remote right thalamic lacunar infarct versus dilated perivascular space without significant interval progression comparing across modalities to the brain MRI dated 03/28/2010. 3. Mild inflammatory paranasal sinus disease has a chronic appearance.   Electronically Signed   By: Jacqulynn Cadet M.D.   On: 01/31/2014 15:59    Anti-infectives: Anti-infectives    Start     Dose/Rate Route Frequency Ordered Stop   01/28/14 1500  cefTRIAXone (ROCEPHIN) 2 g in dextrose 5 % 50 mL IVPB - Premix     2 g100 mL/hr over 30 Minutes Intravenous Every 24 hours 01/28/14 1350     01/28/14 0515  piperacillin-tazobactam (ZOSYN) IVPB 3.375 g     3.375 g100 mL/hr over 30 Minutes Intravenous  Once 01/28/14 6195 01/28/14 0932      Assessment/Plan: Problem List: Patient Active Problem List   Diagnosis Date Noted  . Abdominal pain 01/28/2014  . Cholelithiasis 01/28/2014  . Cholecystitis 01/28/2014  . Symptomatic cholelithiasis 01/28/2014  . Hyperglycemia 01/28/2014  . Chest pain 02/01/2013  . Essential hypertension 02/01/2013  . Hyperlipidemia 02/01/2013    Enema ordered.  Slow improvement. 3 Days Post-Op    LOS: 5 days   Matt B. Hassell Done, MD, [facility] [phone] beeper [phone]  02/02/2014 9:36 AM

## 2014-02-02 NOTE — Progress Notes (Signed)
Noted blood in pt's stool tonight. Fredirick Maudlin  made aware. Pt for CBC in AM. Will continue to monitor.

## 2014-02-03 ENCOUNTER — Inpatient Hospital Stay (HOSPITAL_COMMUNITY): Payer: PRIVATE HEALTH INSURANCE

## 2014-02-03 LAB — GLUCOSE, CAPILLARY
Glucose-Capillary: 143 mg/dL — ABNORMAL HIGH (ref 70–99)
Glucose-Capillary: 147 mg/dL — ABNORMAL HIGH (ref 70–99)
Glucose-Capillary: 97 mg/dL (ref 70–99)
Glucose-Capillary: 118 mg/dL — ABNORMAL HIGH (ref 70–99)

## 2014-02-03 LAB — CBC
HCT: 33.5 % — ABNORMAL LOW (ref 39.0–52.0)
Hemoglobin: 11.1 g/dL — ABNORMAL LOW (ref 13.0–17.0)
MCH: 26.9 pg (ref 26.0–34.0)
MCHC: 33.1 g/dL (ref 30.0–36.0)
MCV: 81.3 fL (ref 78.0–100.0)
Platelets: 248 10*3/uL (ref 150–400)
RBC: 4.12 MIL/uL — ABNORMAL LOW (ref 4.22–5.81)
RDW: 15 % (ref 11.5–15.5)
WBC: 8.2 10*3/uL (ref 4.0–10.5)

## 2014-02-03 LAB — CULTURE, BLOOD (ROUTINE X 2)
Culture: NO GROWTH
Culture: NO GROWTH

## 2014-02-03 LAB — COMPREHENSIVE METABOLIC PANEL
ALT: 15 U/L (ref 0–53)
AST: 19 U/L (ref 0–37)
Albumin: 2.1 g/dL — ABNORMAL LOW (ref 3.5–5.2)
Alkaline Phosphatase: 73 U/L (ref 39–117)
Anion gap: 13 (ref 5–15)
BUN: 18 mg/dL (ref 6–23)
CO2: 23 mEq/L (ref 19–32)
Calcium: 8.2 mg/dL — ABNORMAL LOW (ref 8.4–10.5)
Chloride: 101 mEq/L (ref 96–112)
Creatinine, Ser: 1.11 mg/dL (ref 0.50–1.35)
GFR calc Af Amer: 74 mL/min — ABNORMAL LOW (ref >90)
GFR calc non Af Amer: 63 mL/min — ABNORMAL LOW (ref >90)
Glucose, Bld: 125 mg/dL — ABNORMAL HIGH (ref 70–99)
Potassium: 3.9 mEq/L (ref 3.7–5.3)
Sodium: 137 mEq/L (ref 137–147)
Total Bilirubin: 0.3 mg/dL (ref 0.3–1.2)
Total Protein: 5.9 g/dL — ABNORMAL LOW (ref 6.0–8.3)

## 2014-02-03 MED ORDER — IOHEXOL 300 MG/ML  SOLN
25.0000 mL | INTRAMUSCULAR | Status: AC
  Administered 2014-02-03 (×2): 25 mL via ORAL

## 2014-02-03 MED ORDER — HYDROCORTISONE ACETATE 25 MG RE SUPP
25.0000 mg | Freq: Two times a day (BID) | RECTAL | Status: DC
  Administered 2014-02-03 – 2014-02-06 (×7): 25 mg via RECTAL
  Filled 2014-02-03 (×11): qty 1

## 2014-02-03 NOTE — Progress Notes (Signed)
Physical Therapy Treatment Patient Details Name: Peter Mann MRN: [id number] DOB: [birth year] Today's Date: 02/03/2014    History of Present Illness 73 yo male with abd pain and received laparoscopic cholecystectomy and new dx DM.  Has medical management ongoing     PT Comments    Patient progressing well with mobility however continues to exhibit impulsivity and poor safety awareness putting pt at increased risk for falls. Tolerated stair negotiation with multiple cues for safety. Pt not able to clean self after toileting and lives alone. Continues to not be safe to return home. Appropriate for ST SNF. Will continue to follow and progress as tolerated.   Follow Up Recommendations  SNF;Supervision/Assistance - 24 hour     Equipment Recommendations  Rolling walker with 5" wheels;3in1 (PT)    Recommendations for Other Services       Precautions / Restrictions Precautions Precautions: Fall Restrictions Weight Bearing Restrictions: No    Mobility  Bed Mobility Overal bed mobility: Needs Assistance Bed Mobility: Rolling;Sit to Sidelying Rolling: Supervision       Sit to sidelying: Supervision General bed mobility comments: Superivison for safety. HOB flat, use of rails.  Transfers Overall transfer level: Needs assistance Equipment used: Rolling walker (2 wheeled) Transfers: Sit to/from Stand Sit to Stand: Min guard         General transfer comment: Requires constant cues for safety due to impulsivity. Stood from Youth worker, from toilet x1 with grab bar for assist.   Ambulation/Gait Ambulation/Gait assistance: Min guard Ambulation Distance (Feet): 150 Feet Assistive device: Rolling walker (2 wheeled) Gait Pattern/deviations: Step-through pattern;Decreased stride length;Trunk flexed;Shuffle Gait velocity: reduced and occas stops   General Gait Details: VC's for upright posture and safety with use of RW. Shuffling of Bil feet.    Stairs Stairs: Yes Stairs  assistance: Min guard Stair Management: One rail Right;Alternating pattern Number of Stairs: 3 General stair comments: Cues to decrease speed as pt impulsively climbing steps getting twisted in IV, "I can do all of them" Trying to descend backwards/sideways. Requires cues for safety.  Wheelchair Mobility    Modified Rankin (Stroke Patients Only)       Balance Overall balance assessment: Needs assistance Sitting-balance support: Feet supported;No upper extremity supported Sitting balance-Leahy Scale: Fair     Standing balance support: During functional activity;Bilateral upper extremity supported Standing balance-Leahy Scale: Poor Standing balance comment: requires BUE support on RW for safety. total A for pericare. "I can't clean myself." Discussed how pt will be able to do this at home if he cannot here.                     Cognition Arousal/Alertness: Awake/alert Behavior During Therapy: Impulsive Overall Cognitive Status: Within Functional Limits for tasks assessed                      Exercises      General Comments General comments (skin integrity, edema, etc.): "If i am going to fall, then I will fall." Discussed safety techniques to avoid falls at home and importance of considering ST SNF prior to return home.      Pertinent Vitals/Pain Pain Assessment: 0-10 Pain Score:  (not rated on pain scale.) Pain Location: abdomen Pain Descriptors / Indicators: Aching Pain Intervention(s): Monitored during session;Repositioned    Home Living                      Prior Function  PT Goals (current goals can now be found in the care plan section) Progress towards PT goals: Progressing toward goals    Frequency  Min 3X/week    PT Plan Current plan remains appropriate    Co-evaluation             End of Session Equipment Utilized During Treatment: Gait belt Activity Tolerance: Patient tolerated treatment well Patient left:  in bed;with call bell/phone within reach;with bed alarm set     Time: 0937-1000 PT Time Calculation (min) (ACUTE ONLY): 23 min  Charges:  $Gait Training: 23-37 mins                    G CodesCandy Sledge A 2014-02-05, 10:19 AM Candy Sledge, PT, DPT [phone]

## 2014-02-03 NOTE — Progress Notes (Signed)
Had another episode of bloody stool this AM. Peter Mann notified with order to d/c heparin. Hemoglobin this AM is 11.1.

## 2014-02-03 NOTE — Clinical Social Work Note (Signed)
CSW reviewed chart, per psychiatry's note the pt has decision-making capacity. Pt's daughter Lynelle Smoke expressed concern regarding the pt's safety at home given physical therapy's recommendation for SNF due to gait instability.  Tammy reported that she prefers the pt to transition to [facility] for rehab, however, pt reported that he would like to go home. CSW will continue to follow and assist with discharge needs.   [place], MSW, [place]

## 2014-02-03 NOTE — Progress Notes (Signed)
Patient ID: Peter Mann, male   DOB: 1940/07/10, 73 y.o.   MRN: [id number] [facility] Surgery Progress Note:   4 Days Post-Op  Subjective: Mental status is not coherent or making any sense.  ?Psych history Objective: Vital signs in last 24 hours: Temp:  [98.2 F (36.8 C)-98.6 F (37 C)] 98.2 F (36.8 C) (11/27 0505) Pulse Rate:  [77-90] 90 (11/27 0505) Resp:  [17-18] 17 (11/27 0505) BP: (117-147)/(74-77) 147/75 mmHg (11/27 0505) SpO2:  [94 %-97 %] 95 % (11/27 0505)  Intake/Output from previous day: 11/26 0701 - 11/27 0700 In: 1705 [P.O.:850; I.V.:855] Out: 225 [Urine:225] Intake/Output this shift: Total I/O In: 240 [P.O.:240] Out: -   Physical Exam: Work of breathing is not labored.  Abdomen exam benign.   Lab Results:  Results for orders placed or performed during the hospital encounter of 01/28/14 (from the past 48 hour(s))  Glucose, capillary     Status: Abnormal   Collection Time: 02/01/14 11:51 AM  Result Value Ref Range   Glucose-Capillary 166 (H) 70 - 99 mg/dL  Glucose, capillary     Status: Abnormal   Collection Time: 02/01/14  4:59 PM  Result Value Ref Range   Glucose-Capillary 147 (H) 70 - 99 mg/dL  Glucose, capillary     Status: Abnormal   Collection Time: 02/01/14  9:20 PM  Result Value Ref Range   Glucose-Capillary 128 (H) 70 - 99 mg/dL  Glucose, capillary     Status: Abnormal   Collection Time: 02/02/14  7:41 AM  Result Value Ref Range   Glucose-Capillary 159 (H) 70 - 99 mg/dL  Glucose, capillary     Status: Abnormal   Collection Time: 02/02/14 11:35 AM  Result Value Ref Range   Glucose-Capillary 167 (H) 70 - 99 mg/dL  Glucose, capillary     Status: Abnormal   Collection Time: 02/02/14  4:47 PM  Result Value Ref Range   Glucose-Capillary 153 (H) 70 - 99 mg/dL  Glucose, capillary     Status: Abnormal   Collection Time: 02/02/14  9:21 PM  Result Value Ref Range   Glucose-Capillary 166 (H) 70 - 99 mg/dL  CBC     Status: Abnormal   Collection  Time: 02/03/14  3:05 AM  Result Value Ref Range   WBC 8.2 4.0 - 10.5 K/uL   RBC 4.12 (L) 4.22 - 5.81 MIL/uL   Hemoglobin 11.1 (L) 13.0 - 17.0 g/dL   HCT 33.5 (L) 39.0 - 52.0 %   MCV 81.3 78.0 - 100.0 fL   MCH 26.9 26.0 - 34.0 pg   MCHC 33.1 30.0 - 36.0 g/dL   RDW 15.0 11.5 - 15.5 %   Platelets 248 150 - 400 K/uL  Glucose, capillary     Status: Abnormal   Collection Time: 02/03/14  7:13 AM  Result Value Ref Range   Glucose-Capillary 143 (H) 70 - 99 mg/dL    Radiology/Results: No results found.  Anti-infectives: Anti-infectives    Start     Dose/Rate Route Frequency Ordered Stop   01/28/14 1500  cefTRIAXone (ROCEPHIN) 2 g in dextrose 5 % 50 mL IVPB - Premix     2 g100 mL/hr over 30 Minutes Intravenous Every 24 hours 01/28/14 1350     01/28/14 0515  piperacillin-tazobactam (ZOSYN) IVPB 3.375 g     3.375 g100 mL/hr over 30 Minutes Intravenous  Once 01/28/14 7672 01/28/14 0947      Assessment/Plan: Problem List: Patient Active Problem List   Diagnosis Date Noted  .  Abdominal pain 01/28/2014  . Cholelithiasis 01/28/2014  . Cholecystitis 01/28/2014  . Symptomatic cholelithiasis 01/28/2014  . Hyperglycemia 01/28/2014  . Chest pain 02/01/2013  . Essential hypertension 02/01/2013  . Hyperlipidemia 02/01/2013    Surgery will see prn.  4 Days Post-Op    LOS: 6 days   Matt B. Hassell Done, MD, [facility] [phone] beeper [phone]  02/03/2014 9:41 AM

## 2014-02-03 NOTE — Progress Notes (Signed)
Patient ID: Peter Mann  male  KVQ:[id number]    DOB: Apr 19, 1940    DOA: 01/28/2014  PCP: Gaynelle Arabian, MD   Brief history of present illness  Patient is a 73 year old male with hypertension, hyperlipidemia, vertigo, BPH presented to ED with abdominal pain. Patient reported the abdominal pain for about 8 weeks, worsened in the last 1 week prior to admission. He reported pain as diffuse, 10/10 sharp and constant. CT scan on 11/14 when he was seen in ED showed cholelithiasis. Due to persistent symptoms, patient returned back on 11/21, Repeat CT abdomen showed possible gangrenous cholecystitis, no mesenteric ischemia. General surgery was consulted, patient underwent HIDA scan which was positive for cholecystitis. Patient underwent laparoscopic cholecystectomy on 01/30/14 Postoperative course has been complicated with significant abdominal distention, constipation, confusion. Patient underwent enema yesterday, subsequently started bleeding BRBPR after the enema. Patient is still tight and distended.  Assessment/Plan: Principal Problem:   Abdominal pain with likely acute cholecystitis postop day 4, with abd distention, bright red blood per rectum - Status post cholecystectomy, postop day #4, surgery following  - Abdomen still tight and distended, received an enema yesterday, subsequently started having GI bleed, called GI consult  - Hemoglobin stable  Active Problems:   Essential hypertension - Currently stable, continue Norvasc, metoprolol    Hyperglycemia with likely underlying diabetes mellitus - hemoglobin A1c 8.8, continue sliding scale insulin  BPH, history of prostate Ca- Continue Flomax  GERD -Continue PPI  Delirium and intermittent confusion -UA negative for UTI -CT head showed no acute intracranial abnormality, atrophy with chronic microvascular ischemic white matter disease and remote right thalamus lacunar infarct versus dilated perivascular , 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  DVT Prophylaxis: SCDs  Code Status: Full code  Family Communication:  [facility]    Consultants:  Gen. surgery  Procedures:  Lap cholecystectomy  Antibiotics:  IV Rocephin    Subjective: Patient seen and examined,abdomen distended and tight, having bright red blood per rectum after enema yesterday    Objective: Weight change:   Intake/Output Summary (Last 24 hours) at 02/03/14 1034 Last data filed at 02/03/14 0922  Gross per 24 hour  Intake   1585 ml  Output    225 ml  Net   1360 ml   Blood pressure 147/75, pulse 90, temperature 98.2 F (36.8 C), temperature source Oral, resp. rate 17, height 5\' 7"  (1.702 m), weight 91.173 kg (201 lb), SpO2 95 %.  Physical Exam: General:Alert and awake, difficult to understand, ? Somewhat confused CVS: S1-S2 clear Chest: CTAB  Abdomen: soft , abdomen still tight and distended  Extremities: no c/c/e bilaterally   Lab Results: Basic Metabolic Panel:  Recent Labs Lab 01/30/14 0545 01/31/14 0420  NA 133* 135*  K 3.5* 3.4*  CL 96 98  CO2 21 23  GLUCOSE 147* 140*  BUN 18 21  CREATININE 1.21 1.46*  CALCIUM 8.4 8.3*   Liver Function Tests:  Recent Labs Lab 01/29/14 0900 01/31/14 0420  AST 15 26  ALT 12 18  ALKPHOS 96 81  BILITOT 0.7 0.4  PROT 6.9 6.0  ALBUMIN 3.3* 2.4*    Recent Labs Lab 01/28/14 0211  LIPASE 18   No results for input(s): AMMONIA in the last 168 hours. CBC:  Recent Labs Lab 01/28/14 0211  02/01/14 0525 02/03/14 0305  WBC 8.6  < > 9.1 8.2  NEUTROABS 7.2  --   --   --   HGB 13.5  < > 11.6* 11.1*  HCT 41.3  < > 35.6* 33.5*  MCV 83.6  < > 80.7 81.3  PLT 190  < > 208 248  < > = values in this interval not displayed. Cardiac Enzymes: No results for input(s): CKTOTAL, CKMB, CKMBINDEX,  TROPONINI in the last 168 hours. BNP: Invalid input(s): POCBNP CBG:  Recent Labs Lab 02/02/14 0741 02/02/14 1135 02/02/14 1647 02/02/14 2121 02/03/14 0713  GLUCAP 159* 167* 153* 166* 143*     Micro Results: Recent Results (from the past 240 hour(s))  Blood culture (routine x 2)     Status: None   Collection Time: 01/28/14  5:30 AM  Result Value Ref Range Status   Specimen Description BLOOD LEFT HAND  Final   Special Requests BOTTLES DRAWN AEROBIC ONLY [facility]  Final   Culture  Setup Time   Final    01/28/2014 14:53 Performed at [facility]    Culture   Final    NO GROWTH 5 DAYS Performed at [facility]    Report Status 02/03/2014 FINAL  Final  Blood culture (routine x 2)     Status: None   Collection Time: 01/28/14  5:38 AM  Result Value Ref Range Status   Specimen Description BLOOD LEFT ARM  Final   Special Requests BOTTLES DRAWN AEROBIC AND ANAEROBIC 10CC  Final   Culture  Setup Time   Final    01/28/2014 14:53 Performed at [facility]    Culture   Final    NO GROWTH 5 DAYS Performed at [facility]    Report Status 02/03/2014 FINAL  Final  Surgical pcr screen     Status: None   Collection Time: 01/28/14 11:20 AM  Result Value Ref Range Status   MRSA, PCR NEGATIVE NEGATIVE Final   Staphylococcus aureus NEGATIVE NEGATIVE Final    Comment:        The Xpert SA Assay (FDA approved for NASAL specimens in patients over 51 years of age), is one component of a comprehensive surveillance program.  Test performance has been validated by [employer] for patients greater than or equal to 44 year old. It is not intended to diagnose infection nor to guide or monitor treatment.     Studies/Results: Ct Head Wo Contrast  01/31/2014   CLINICAL DATA:  73 year old male with acute encephalopathy and confusion  EXAM: CT HEAD WITHOUT CONTRAST  TECHNIQUE: Contiguous axial images were obtained from the base of the skull through the vertex  without intravenous contrast.  COMPARISON:  Most recent prior MRI of the brain 03/28/2010  FINDINGS: Negative for acute intracranial hemorrhage, acute infarction, mass, mass effect, hydrocephalus or midline shift. Gray-white differentiation is preserved throughout. Generalized cerebral and cerebellar cortical atrophy. Mild periventricular hypoattenuation most consistent with chronic microvascular ischemic white matter disease. Focal hypoattenuation in the right thalamus suggests remote lacunar infarct. No focal soft tissue or calvarial abnormality. Globes and orbits are symmetric and intact bilaterally. Normal aeration of the mastoid air cells. Polyp versus mucous retention cyst in the inferior aspect of the left maxillary sinus. Mild mucoperiosteal thickening in the right sphenoid air cell and scattered left frontal ethmoid sinuses.  IMPRESSION: 1. No acute intracranial abnormality. 2. Atrophy, chronic microvascular ischemic white matter disease and remote right thalamic lacunar infarct versus dilated perivascular space without significant interval progression comparing across modalities to the brain MRI dated 03/28/2010. 3. Mild inflammatory  paranasal sinus disease has a chronic appearance.   Electronically Signed   By: Jacqulynn Cadet M.D.   On: 01/31/2014 15:59   Nm Hepatobiliary Including Gb  01/29/2014   CLINICAL DATA:  Cholelithiasis and clinical evidence of probable cholecystitis.  EXAM: NUCLEAR MEDICINE HEPATOBILIARY IMAGING  TECHNIQUE: Sequential images of the abdomen were obtained out to 60 minutes following intravenous administration of radiopharmaceutical.  RADIOPHARMACEUTICALS:  5.0 Millic[employer]ie [id number] Choletec. During the procedure, the patient also received 3.7 mg of IV morphine.  COMPARISON:  Abdominal ultrasound on 01/28/2014 and CTA of the abdomen on 01/28/2014.  FINDINGS: Imaging demonstrates normal uptake of radiopharmaceutical by the liver and normal biliary excretion with prompt  visualization of the small bowel. There is nonvisualization of the gallbladder over 60 min. After further administration of IV morphine, there is continued nonvisualization of the gallbladder over 30 min. Findings are suggestive of cholecystitis.  IMPRESSION: Positive hepatobiliary imaging demonstrating nonvisualization of the gallbladder prior to and following administration of IV morphine. Findings are consistent with cholecystitis.   Electronically Signed   By: Aletta Edouard M.D.   On: 01/29/2014 10:47   Ct Abdomen Pelvis W Contrast  01/21/2014   CLINICAL DATA:  Abdominal pain for 6 weeks  EXAM: CT ABDOMEN AND PELVIS WITH CONTRAST  TECHNIQUE: Multidetector CT imaging of the abdomen and pelvis was performed using the standard protocol following bolus administration of intravenous contrast.  CONTRAST:  11mL OMNIPAQUE IOHEXOL 300 MG/ML  SOLN  COMPARISON:  07/03/2006  FINDINGS: The lung bases are free of acute infiltrate or sizable effusion.  The liver is diffusely fatty infiltrated. The spleen, adrenal glands and pancreas are normal in their CT appearance. The gallbladder is well distended and demonstrates multiple small gallstones. No wall thickening or pericholecystic fluid is noted. Kidneys are well visualized bilaterally and demonstrate some renal cystic change in the upper pole on the right. No renal calculi or obstructive changes are seen.  The appendix is not well visualized although no inflammatory changes to suggest appendicitis are noted. 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 The bladder is well distended. The prostate indents upon the inferior aspect of the bladder. Degenerative changes of the lumbar spine are seen. No pelvic mass lesion is noted. Aortoiliac calcifications are noted without aneurysmal dilatation.  IMPRESSION: Cholelithiasis without acute abnormality.  No acute abnormality is noted.   Electronically Signed   By: Inez Catalina M.D.   On:  01/21/2014 07:51   US Abdomen Limited  01/28/2014   CLINICAL DATA:  Initial evaluation for right upper quadrant pain.  EXAM: US ABDOMEN LIMITED - RIGHT UPPER QUADRANT  COMPARISON:  Prior CT from 01/21/2014  FINDINGS: Gallbladder:  Multiple echogenic shadowing stones present within the gallbladder lumen measuring up to 1.3 cm in diameter. Several of these appeared adherent to the gallbladder wall. Probable gallbladder sludge present as well. Gallbladder wall minimally thickened to 4 mm. No free pericholecystic fluid. No sonographic Murphy sign elicited on exam.  Common bile duct:  Diameter: 7.6 mm, within normal limits for patient age. No intrahepatic biliary dilatation.  Liver:  No focal lesion identified. Within normal limits in parenchymal echogenicity.  IMPRESSION: 1. Stones with sludge within the gallbladder lumen with minimal gallbladder wall thickening. Several of the stones appeared adherent to the gallbladder wall. No overt sonographic evidence for acute cholecystitis. 2. No biliary dilatation identified.   Electronically Signed   By: Jeannine Boga M.D.   On: 01/28/2014 03:58   Ct Angio Abd/pel W/  And/or W/o  01/28/2014   CLINICAL DATA:  In the extreme abdominal pain and abdominal distention. Nausea. Generalized abdominal pain.  EXAM: CTA ABDOMEN AND PELVIS wITHOUT AND WITH CONTRAST  TECHNIQUE: Multidetector CT imaging of the abdomen and pelvis was performed using the standard protocol during bolus administration of intravenous contrast. Multiplanar reconstructed images and MIPs were obtained and reviewed to evaluate the vascular anatomy.  CONTRAST:  160mL OMNIPAQUE IOHEXOL 350 MG/ML SOLN  COMPARISON:  Ultrasound abdomen 01/28/2014. CT abdomen and pelvis 01/21/2014.  FINDINGS: CT images obtained during the angiographic phase after contrast administration demonstrate diffuse calcification and mild mural thrombus/atherosclerotic changes in the abdominal aorta. No evidence of aneurysm or  dissection. The abdominal aorta, celiac axis, superior mesenteric artery, single bilateral renal arteries, inferior mesenteric artery, and bilateral common iliac, external iliac, internal iliac, and common femoral arteries are patent.  Mild dependent changes in the lung bases. Small pericardial effusion or thickening. Thickening of the wall of the distal esophagus may indicate reflux esophagitis.  Cholelithiasis with gas in the gallbladder and mild gallbladder wall thickening. Appearance is suspicious for gangrenous cholecystitis. No bile duct dilatation. The liver, spleen, pancreas, adrenal glands, kidneys, inferior vena cava, and retroperitoneal lymph nodes appear unremarkable. Stomach is mildly distended with a fluid. No gastric wall thickening. Small bowel are decompressed. Diffusely stool-filled colon without distention. No free air or free fluid in the abdomen.  Pelvis: Prostate gland is enlarged. Bladder wall is mildly thickened which may be due to bladder wall hypertrophy or infection. Correlate clinically for signs of cystitis. Diverticulosis of the sigmoid colon. No evidence of diverticulitis. The appendix is normal. No free or loculated pelvic fluid collections. No pelvic mass or lymphadenopathy. Degenerative changes in the lumbar spine. No destructive bone lesions appreciated.  Review of the MIP images confirms the above findings.  IMPRESSION: No evidence of abdominal aortic aneurysm or dissection. Abdominal aorta and major branch vessels are patent.  Cholelithiasis with gas in the gallbladder and mild gallbladder wall thickening worrisome for gangrenous cholecystitis.   Electronically Signed   By: Lucienne Capers M.D.   On: 01/28/2014 04:58    Medications: Scheduled Meds: . amLODipine  10 mg Oral Daily  . aspirin  81 mg Oral Daily  . cefTRIAXone (ROCEPHIN)  IV  2 g Intravenous Q24H  . fluorometholone  1 drop Left Eye QID  . insulin aspart  0-5 Units Subcutaneous QHS  . insulin aspart  0-9  Units Subcutaneous TID WC  . metoprolol  50 mg Oral Daily  . olopatadine  1 drop Both Eyes Daily  . pantoprazole  40 mg Oral Daily  . tamsulosin  0.4 mg Oral QPC supper      LOS: 6 days   Monterius Rolf M.D. [facility] 02/03/2014, 10:34 AM Pager: [phone]  If 7PM-7AM, please contact night-coverage [email or web address] Password [email or web address]

## 2014-02-03 NOTE — Consult Note (Signed)
[facility] Gastroenterology Consultation Note  Referring Provider: Dr. Estill Cotta [facility]) Primary Care Physician:  Gaynelle Arabian, MD Primary Gastroenterologist:  Dr. Teena Irani  Reason for Consultation:  Abdominal distention, blood in stool  HPI: Peter Mann is a 73 y.o. male admitted for acute cholecystitis.  Is post-operative day #4 from cholecystectomy.  We were asked to see patient for abdominal distention and blood in stool.  Patient endorses progressive abdominal distention over the past few days, with some associated nausea and intermittent vomiting.  He has generalized non-focal abdominal discomfort.  Yesterday he reports receiving an enema for presumed constipation as cause of his abdominal distention; soon after enema was done, patient reports having some hematochezia; he denies having had hematochezia until he had the enema done.  He does have history, however, of intermittent hematochezia, and had colonoscopy in 2010 by Dr. Amedeo Plenty for this reason, study of which showed a small adenomatous polyp (removed), sigmoid diverticulosis, and small rectal inflammation (not biopsied).     Past Medical History  Diagnosis Date  . Dizziness   . Hypertension   . Hyperlipidemia   . History of tobacco abuse   . Chest pain   . Cancer     prostrate ca, radiation treatments    Past Surgical History  Procedure Laterality Date  . Cholecystectomy N/A 01/30/2014    Procedure: LAPAROSCOPIC CHOLECYSTECTOMY;  Surgeon: Ralene Ok, MD;  Location: [place];  Service: General;  Laterality: N/A;    Prior to Admission medications   Medication Sig Start Date End Date Taking? Authorizing Provider  ALPRAZolam (XANAX) 0.25 MG tablet Take 0.25 mg by mouth 2 (two) times daily as needed for anxiety.  12/20/12  Yes Historical Provider, MD  amLODipine (NORVASC) 10 MG tablet Take 10 mg by mouth daily.   Yes Historical Provider, MD  aspirin 81 MG tablet Take 81 mg by mouth daily.   Yes Historical Provider, MD   Calcium Carb-Cholecalciferol (CALCIUM 600 + D PO) Take 1 tablet by mouth daily.   Yes Historical Provider, MD  carboxymethylcellulose (REFRESH PLUS) 0.5 % SOLN Place 1 drop into both eyes 3 (three) times daily as needed (dry eyes).    Yes Historical Provider, MD  dexlansoprazole (DEXILANT) 60 MG capsule Take 60 mg by mouth daily.   Yes Historical Provider, MD  fluorometholone (FML) 0.1 % ophthalmic suspension Place 1 drop into the left eye 4 (four) times daily.   Yes Historical Provider, MD  hydrochlorothiazide (HYDRODIURIL) 12.5 MG tablet Take 12.5 mg by mouth daily.   Yes Historical Provider, MD  ibuprofen (ADVIL,MOTRIN) 200 MG tablet Take 200 mg by mouth every 6 (six) hours as needed for moderate pain.    Yes Historical Provider, MD  magnesium hydroxide (MILK OF MAGNESIA) 400 MG/5ML suspension Take 30 mLs by mouth daily as needed for mild constipation.   Yes Historical Provider, MD  meclizine (ANTIVERT) 25 MG tablet Take 25 mg by mouth 3 (three) times daily as needed for dizziness.   Yes Historical Provider, MD  metoprolol (LOPRESSOR) 50 MG tablet Take 50 mg by mouth daily.  12/11/12  Yes Historical Provider, MD  Olopatadine HCl (PATADAY) 0.2 % SOLN Place 1 drop into both eyes daily.   Yes Historical Provider, MD  potassium chloride (K-DUR,KLOR-CON) 10 MEQ tablet Take 10 mEq by mouth daily.   Yes Historical Provider, MD  promethazine (PHENERGAN) 25 MG tablet Take 25 mg by mouth every 6 (six) hours as needed for nausea.    Yes Historical Provider, MD  rosuvastatin (  CRESTOR) 20 MG tablet Take 20 mg by mouth daily.   Yes Historical Provider, MD  tamsulosin (FLOMAX) 0.4 MG CAPS capsule Take 0.4 mg by mouth daily after supper.   Yes Historical Provider, MD  tiZANidine (ZANAFLEX) 4 MG tablet Take 4 mg by mouth every 6 (six) hours as needed for muscle spasms.   Yes Historical Provider, MD  HYDROcodone-acetaminophen (NORCO/VICODIN) 5-325 MG per tablet Take 1 tablet by mouth every 6 (six) hours as needed  for severe pain. 01/31/14   Ripudeep Krystal Eaton, MD  senna-docusate (SENOKOT-S) 8.6-50 MG per tablet Take 1 tablet by mouth at bedtime. For constipation 01/31/14   Ripudeep Krystal Eaton, MD  tamsulosin (FLOMAX) 0.4 MG CAPS capsule TAKE 1 CAPSULE AT BEDTIME Patient not taking: Reported on 01/21/2014 11/07/13   Lora Paula, MD  tamsulosin (FLOMAX) 0.4 MG CAPS capsule TAKE 1 CAPSULE AT BEDTIME 12/07/13   Lora Paula, MD  traMADol (ULTRAM) 50 MG tablet Take 1 tablet (50 mg total) by mouth every 12 (twelve) hours as needed for moderate pain. 01/31/14   Ripudeep Krystal Eaton, MD    Current Facility-Administered Medications  Medication Dose Route Frequency Provider Last Rate Last Dose  . 0.9 %  sodium chloride infusion   Intravenous Continuous Ripudeep Krystal Eaton, MD   Stopped at 01/31/14 2200  . acetaminophen (TYLENOL) tablet 650 mg  650 mg Oral Q6H PRN Ripudeep Krystal Eaton, MD   650 mg at 02/02/14 2305   Or  . acetaminophen (TYLENOL) suppository 650 mg  650 mg Rectal Q6H PRN Ripudeep Krystal Eaton, MD      . ALPRAZolam Duanne Moron) tablet 0.25 mg  0.25 mg Oral BID PRN Ripudeep Krystal Eaton, MD   0.25 mg at 01/31/14 2250  . alum & mag hydroxide-simeth (MAALOX/MYLANTA) 200-200-20 MG/5ML suspension 30 mL  30 mL Oral Q6H PRN Ripudeep K Rai, MD   30 mL at 02/02/14 1504  . amLODipine (NORVASC) tablet 10 mg  10 mg Oral Daily Ripudeep Krystal Eaton, MD   10 mg at 02/03/14 1110  . cefTRIAXone (ROCEPHIN) 2 g in dextrose 5 % 50 mL IVPB - Premix  2 g Intravenous Q24H Saverio Danker, PA-C   2 g at 02/02/14 1505  . dextrose 5 %-0.45 % sodium chloride infusion   Intravenous Continuous Ripudeep Krystal Eaton, MD 75 mL/hr at 02/03/14 0814    . fluorometholone (FML) 0.1 % ophthalmic suspension 1 drop  1 drop Left Eye QID Ripudeep Krystal Eaton, MD   1 drop at 02/03/14 1111  . insulin aspart (novoLOG) injection 0-5 Units  0-5 Units Subcutaneous QHS Ripudeep Krystal Eaton, MD   0 Units at 01/31/14 2235  . insulin aspart (novoLOG) injection 0-9 Units  0-9 Units Subcutaneous TID WC Ripudeep Krystal Eaton, MD   1 Units at 02/03/14 0755  . meclizine (ANTIVERT) tablet 25 mg  25 mg Oral TID PRN Ripudeep Krystal Eaton, MD   25 mg at 01/31/14 1005  . metoprolol (LOPRESSOR) tablet 50 mg  50 mg Oral Daily Ripudeep Krystal Eaton, MD   50 mg at 02/03/14 1111  . olopatadine (PATANOL) 0.1 % ophthalmic solution 1 drop  1 drop Both Eyes Daily Ripudeep Krystal Eaton, MD   1 drop at 02/03/14 1111  . ondansetron (ZOFRAN) tablet 4 mg  4 mg Oral Q6H PRN Ripudeep K Rai, MD       Or  . ondansetron (ZOFRAN) injection 4 mg  4 mg Intravenous Q6H PRN Ripudeep Krystal Eaton, MD   4  mg at 02/03/14 0805  . pantoprazole (PROTONIX) EC tablet 40 mg  40 mg Oral Daily Ripudeep Krystal Eaton, MD   40 mg at 02/03/14 0805  . polyvinyl alcohol (LIQUIFILM TEARS) 1.4 % ophthalmic solution 1 drop  1 drop Both Eyes TID PRN Ripudeep K Rai, MD      . tamsulosin (FLOMAX) capsule 0.4 mg  0.4 mg Oral QPC supper Ripudeep K Rai, MD   0.4 mg at 02/02/14 1723  . tiZANidine (ZANAFLEX) tablet 4 mg  4 mg Oral Q6H PRN Ripudeep Krystal Eaton, MD        Allergies as of 01/28/2014  . (No Known Allergies)    Family History  Problem Relation Age of Onset  . Cancer Brother   . Cancer Sister     History   Social History  . Marital Status: Legally Separated    Spouse Name: N/A    Number of Children: N/A  . Years of Education: N/A   Occupational History  . Not on file.   Social History Main Topics  . Smoking status: Current Some Day Smoker    Last Attempt to Quit: 02/01/2010  . Smokeless tobacco: Not on file  . Alcohol Use: No     Comment: quit 22 years ago   . Drug Use: No  . Sexual Activity: No   Other Topics Concern  . Not on file   Social History Narrative    Review of Systems: ROS Dr. Tana Coast 01/28/14 reviewed and I agree   Physical Exam: Vital signs in last 24 hours: Temp:  [98.2 F (36.8 C)-98.6 F (37 C)] 98.2 F (36.8 C) (11/27 1307) Pulse Rate:  [77-90] 86 (11/27 1307) Resp:  [17-18] 17 (11/27 1307) BP: (117-147)/(74-77) 143/74 mmHg (11/27 1307) SpO2:  [94  %-97 %] 96 % (11/27 1307) Last BM Date: 02/02/14 General:   Alert,  Well-developed, well-nourished, pleasant and cooperative in NAD Head:  Normocephalic and atraumatic. Eyes:  Sclera clear, no icterus.   Conjunctiva pink. Ears:  Normal auditory acuity. Nose:  No deformity, discharge,  or lesions. Mouth:  No deformity or lesions.  Oropharynx pink & moist. Neck:  Supple; no masses or thyromegaly. Lungs:  Clear throughout to auscultation.   No wheezes, crackles, or rhonchi. No acute distress. Heart:  Regular rate and rhythm; no murmurs, clicks, rubs,  or gallops. Abdomen:  Distended and tympanic; scant high-pitched bowel sounds; mild generalized tenderness without peritonitis No masses, hepatosplenomegaly or hernias noted. No guarding, and without rebound.     Msk:  Symmetrical without gross deformities. Normal posture. Pulses:  Normal pulses noted. Extremities:  Without clubbing or edema. Neurologic:  Seems able to answer most of my questions without troubles; seems grossly normal neurologically. Skin:  Intact without significant lesions or rashes. Cervical Nodes:  No significant cervical adenopathy. Psych:  Alert and cooperative. Answers most of my questions appropriately, Normal mood and affect.   Lab Results:  Recent Labs  02/01/14 0525 02/03/14 0305  WBC 9.1 8.2  HGB 11.6* 11.1*  HCT 35.6* 33.5*  PLT 208 248   BMET No results for input(s): NA, K, CL, CO2, GLUCOSE, BUN, CREATININE, CALCIUM in the last 72 hours. LFT No results for input(s): PROT, ALBUMIN, AST, ALT, ALKPHOS, BILITOT, BILIDIR, IBILI in the last 72 hours. PT/INR No results for input(s): LABPROT, INR in the last 72 hours.  Studies/Results: No results found.  Impression:  1.  Cholecystitis, POD 4 from cholecystectomy. 2.  Abdominal distention.  Suspect ileus, partial bowel obstruction  is not completely excluded. 3.  Hematochezia, low volume.  Plan:  1.  Abdominal xray to assess abdominal distention; if  concern for bowel obstruction, would need repeat CT abdomen/pelvis. 2.  Recheck CMP (none in 3 days). 3.  Trial of Anusol-HC suppositories for bleeding. 4.  Patient will ultimately benefit from colonoscopy, timing of which will be determined by investigation into cause of his abdominal distention. 5.  [facility] will follow; thank you for the consultation.   LOS: 6 days   Seymore Brodowski M  02/03/2014, 1:10 PM

## 2014-02-04 ENCOUNTER — Encounter (HOSPITAL_COMMUNITY): Payer: Self-pay

## 2014-02-04 LAB — BASIC METABOLIC PANEL
Anion gap: 12 (ref 5–15)
BUN: 14 mg/dL (ref 6–23)
CO2: 24 mEq/L (ref 19–32)
Calcium: 8.4 mg/dL (ref 8.4–10.5)
Chloride: 100 mEq/L (ref 96–112)
Creatinine, Ser: 1.13 mg/dL (ref 0.50–1.35)
GFR calc Af Amer: 72 mL/min — ABNORMAL LOW (ref >90)
GFR calc non Af Amer: 62 mL/min — ABNORMAL LOW (ref >90)
Glucose, Bld: 171 mg/dL — ABNORMAL HIGH (ref 70–99)
Potassium: 3.6 mEq/L — ABNORMAL LOW (ref 3.7–5.3)
Sodium: 136 mEq/L — ABNORMAL LOW (ref 137–147)

## 2014-02-04 LAB — GLUCOSE, CAPILLARY
Glucose-Capillary: 127 mg/dL — ABNORMAL HIGH (ref 70–99)
Glucose-Capillary: 145 mg/dL — ABNORMAL HIGH (ref 70–99)
Glucose-Capillary: 113 mg/dL — ABNORMAL HIGH (ref 70–99)
Glucose-Capillary: 150 mg/dL — ABNORMAL HIGH (ref 70–99)

## 2014-02-04 LAB — CLOSTRIDIUM DIFFICILE BY PCR: Toxigenic C. Difficile by PCR: NEGATIVE

## 2014-02-04 MED ORDER — METRONIDAZOLE IN NACL 5-0.79 MG/ML-% IV SOLN
500.0000 mg | Freq: Three times a day (TID) | INTRAVENOUS | Status: DC
  Administered 2014-02-04 – 2014-02-06 (×6): 500 mg via INTRAVENOUS
  Filled 2014-02-04 (×8): qty 100

## 2014-02-04 MED ORDER — FUROSEMIDE 10 MG/ML IJ SOLN
40.0000 mg | Freq: Once | INTRAMUSCULAR | Status: AC
  Administered 2014-02-04: 40 mg via INTRAVENOUS
  Filled 2014-02-04: qty 4

## 2014-02-04 MED ORDER — POLYETHYLENE GLYCOL 3350 17 G PO PACK
17.0000 g | PACK | Freq: Two times a day (BID) | ORAL | Status: DC
  Administered 2014-02-04 – 2014-02-06 (×4): 17 g via ORAL
  Filled 2014-02-04 (×7): qty 1

## 2014-02-04 NOTE — Plan of Care (Signed)
Problem: Phase II Progression Outcomes Goal: IV changed to normal saline lock Outcome: Completed/Met Date Met:  02/04/14     

## 2014-02-04 NOTE — Progress Notes (Signed)
Patient had 5 small soft brown stools and one mucous stool clear in color from 7am-7pm. No visible blood seen.

## 2014-02-04 NOTE — Plan of Care (Signed)
Problem: Phase III Progression Outcomes Goal: Pain controlled on oral analgesia Outcome: Completed/Met Date Met:  02/04/14 Goal: Foley discontinued Outcome: Not Applicable Date Met:  03/97/95

## 2014-02-04 NOTE — Progress Notes (Signed)
Pt had small,  Brown loose  Stool mixed with urine

## 2014-02-04 NOTE — Progress Notes (Signed)
[facility] Progress Note  Subjective: Patient states abdomen feels somewhat better, had a stool this morning that was brown without any bright red blood  Objective: Vital signs in last 24 hours: Temp:  [98.1 F (36.7 C)-98.4 F (36.9 C)] 98.4 F (36.9 C) (11/28 0535) Pulse Rate:  [82-88] 88 (11/28 0535) Resp:  [16-17] 16 (11/28 0535) BP: (132-143)/(74-82) 132/74 mmHg (11/28 0535) SpO2:  [95 %-96 %] 96 % (11/28 0535) Weight change:    PE: Abdomen soft moderately distended  Lab Results: Results for orders placed or performed during the hospital encounter of 01/28/14 (from the past 24 hour(s))  Glucose, capillary     Status: Abnormal   Collection Time: 02/03/14 11:58 AM  Result Value Ref Range   Glucose-Capillary 147 (H) 70 - 99 mg/dL  Comprehensive metabolic panel     Status: Abnormal   Collection Time: 02/03/14  3:10 PM  Result Value Ref Range   Sodium 137 137 - 147 mEq/L   Potassium 3.9 3.7 - 5.3 mEq/L   Chloride 101 96 - 112 mEq/L   CO2 23 19 - 32 mEq/L   Glucose, Bld 125 (H) 70 - 99 mg/dL   BUN 18 6 - 23 mg/dL   Creatinine, Ser 1.11 0.50 - 1.35 mg/dL   Calcium 8.2 (L) 8.4 - 10.5 mg/dL   Total Protein 5.9 (L) 6.0 - 8.3 g/dL   Albumin 2.1 (L) 3.5 - 5.2 g/dL   AST 19 0 - 37 U/L   ALT 15 0 - 53 U/L   Alkaline Phosphatase 73 39 - 117 U/L   Total Bilirubin 0.3 0.3 - 1.2 mg/dL   GFR calc non Af Amer 63 (L) >90 mL/min   GFR calc Af Amer 74 (L) >90 mL/min   Anion gap 13 5 - 15  Glucose, capillary     Status: None   Collection Time: 02/03/14  5:26 PM  Result Value Ref Range   Glucose-Capillary 97 70 - 99 mg/dL  Glucose, capillary     Status: Abnormal   Collection Time: 02/03/14  9:52 PM  Result Value Ref Range   Glucose-Capillary 118 (H) 70 - 99 mg/dL  Glucose, capillary     Status: Abnormal   Collection Time: 02/04/14  7:17 AM  Result Value Ref Range   Glucose-Capillary 127 (H) 70 - 99 mg/dL    Studies/Results: Ct Abdomen Pelvis Wo  Contrast  02/03/2014   CLINICAL DATA:  Generalized abdominal pain.  EXAM: CT ABDOMEN AND PELVIS WITHOUT CONTRAST  TECHNIQUE: Multidetector CT imaging of the abdomen and pelvis was performed following the standard protocol without IV contrast.  COMPARISON:  CT scan of January 28, 2014.  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 Interval development of mild right pleural effusion with atelectasis or pneumonia of right lower lobe.  The spleen and pancreas appear normal. Adrenal glands and kidneys appear normal without hydronephrosis or renal obstruction. No renal or ureteral calculi are noted. Urinary bladder appears normal. The patient is status post cholecystectomy. Moderate amount of fluid is seen in the gallbladder fossa with associated surgical packing or Gel-Foam. However, biliary leak cannot be excluded. Mildly dilated small bowel loops are noted which most likely represents postoperative ileus. Atherosclerotic calcifications of abdominal aorta and iliac arteries are noted without aneurysm formation. Urinary bladder appears normal. Appendix appears normal. Large amount of stool is noted in the right colon. Mild wall thickening of descending and sigmoid colon is noted suggesting colitis.  IMPRESSION: Interval development of mild  right pleural effusion with associated atelectasis or pneumonia of right lower lobe.  Mildly dilated small bowel loops are noted most likely representing postoperative ileus.  Mild wall thickening of descending and sigmoid colon is noted suggesting possible colitis.  Status post cholecystectomy. Moderate amount of fluid is noted in the gallbladder fossa with associated surgical packing material. However, biliary leak cannot be excluded, and HIDA scan is recommended for further evaluation.   Electronically Signed   By: Sabino Dick M.D.   On: 02/03/2014 21:47   Dg Abd 2 Views  02/03/2014   CLINICAL DATA:  Recent cholecystectomy now with abdominal distention;  evaluate patient for ileus versus obstruction.  EXAM: ABDOMEN - 2 VIEW  COMPARISON:  None.  FINDINGS: There is a moderate volume of gas within the colon as well as within the small bowel. Numerous air-fluid levels are demonstrated. There is stool in the right colon. There is no definite gas within the rectum. There are degenerative changes of the lumbar spine.  IMPRESSION: The bowel gas pattern most compatible with a diffuse ileus. There is no evidence of perforation.   Electronically Signed   By: David  Martinique   On: 02/03/2014 14:51      Assessment: 1. Abdominal distention possibly related to constipation and postop ileus 2. Rectal bleeding which appears self-limited possibly related to enema, hemoglobin stable no bloody stool today  Plan: Continue to monitor stools and hemoglobin Will give low-dose Mira lax to clear stool from colon , May need colonoscopy eventually probably electively.    Deonne Rooks C 02/04/2014, 9:48 AM

## 2014-02-04 NOTE — Progress Notes (Signed)
Patient ID: Christop Hippert  male  NAT:[id number]    DOB: 1940/06/16    DOA: 01/28/2014  PCP: Gaynelle Arabian, MD   Brief history of present illness and hospitalization course so far  Patient is a 73 year old male with hypertension, hyperlipidemia, vertigo, BPH presented to ED with abdominal pain. Patient reported the abdominal pain for about 8 weeks, worsened in the last 1 week prior to admission. He reported pain as diffuse, 10/10 sharp and constant. CT scan on 11/14 when he was seen in ED showed cholelithiasis. Due to persistent symptoms, patient returned back on 11/21, Repeat CT abdomen showed possible gangrenous cholecystitis, no mesenteric ischemia. General surgery was consulted, patient underwent HIDA scan which was positive for cholecystitis. Patient underwent laparoscopic cholecystectomy on 01/30/14 Postoperative course has been complicated with significant abdominal distention, constipation, confusion. Patient underwent enema on 11/26, subsequently started bleeding BRBPR after the enema. GI was consulted, recommended repeat CT abdomen and pelvis, trial of Anusol suppositories, ultimately colonoscopy.  Assessment/Plan: Principal Problem:   Abdominal pain with likely acute cholecystitis postop day 4, with abd distention, bright red blood per rectum - Status post cholecystectomy, postop day #5 surgery following  - Abdomen feels slightly less distended, hemoglobin stable, no nausea or vomiting, GI following. -CT abdomen obtained yesterday showed postop ileus, also Interval development of mild right pleural effusion, atelectasis or pneumonia, mild wall thickening of the descending and sigmoid colon suggesting possible colitis. Placed on IV Flagyl.   Active Problems: Mild right pleural effusion - Discontinue IV fluids, I and O's shows 9.2 L fluid positive, placed on IV Lasix 40 mg 1    Essential hypertension - Currently stable, continue Norvasc, metoprolol    Hyperglycemia with  likely underlying diabetes mellitus - hemoglobin A1c 8.8, continue sliding scale insulin  BPH, history of prostate Ca- Continue Flomax  GERD -Continue PPI  Delirium and intermittent confusion -UA negative for UTI -CT head showed no acute intracranial abnormality, atrophy with chronic microvascular ischemic white matter disease and remote right thalamus lacunar infarct versus dilated perivascular , 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  DVT Prophylaxis: SCDs  Code Status: Full code  Family Communication:  [place] facility when medically stable, likely on Monday   Consultants:  Gen. surgery  Procedures:  Lap cholecystectomy  Antibiotics:  IV Rocephin    Subjective:  Abdominal distention improving, had 1 bowel movement yesterday with no blood. Denies any abdominal pain, fevers or chills, chest pain or shortness of breath. Tolerating solids.  Objective: Weight change:   Intake/Output Summary (Last 24 hours) at 02/04/14 1120 Last data filed at 02/04/14 0916  Gross per 24 hour  Intake   1142 ml  Output      0 ml  Net   1142 ml   Blood pressure 144/81, pulse 96, temperature 98.4 F (36.9 C), temperature source Oral, resp. rate 16, height 5\' 7"  (1.702 m), weight 91.173 kg (201 lb), SpO2 96 %.  Physical Exam: General:Alert and awake CVS: S1-S2 clear Chest: CTAB  Abdomen: soft , abdomen Distended but improving  Extremities: no c/c/e bilaterally   Lab Results: Basic Metabolic Panel:  Recent Labs Lab 02/03/14 1510 02/04/14 0936  NA 137 136*  K 3.9 3.6*  CL 101 100  CO2 23 24  GLUCOSE 125* 171*  BUN 18 14  CREATININE 1.11 1.13  CALCIUM 8.2* 8.4   Liver Function Tests:  Recent Labs Lab 01/31/14 0420 02/03/14 1510  AST 26 19  ALT 18 15  ALKPHOS 81 73  BILITOT 0.4 0.3  PROT 6.0  5.9*  ALBUMIN 2.4* 2.1*   No results for input(s): LIPASE, AMYLASE in the last 168 hours. No results for input(s): AMMONIA in the last 168 hours. CBC:  Recent Labs Lab 02/01/14 0525 02/03/14 0305  WBC 9.1 8.2  HGB 11.6* 11.1*  HCT 35.6* 33.5*  MCV 80.7 81.3  PLT 208 248   Cardiac Enzymes: No results for input(s): CKTOTAL, CKMB, CKMBINDEX, TROPONINI in the last 168 hours. BNP: Invalid input(s): POCBNP CBG:  Recent Labs Lab 02/03/14 0713 02/03/14 1158 02/03/14 1726 02/03/14 2152 02/04/14 0717  GLUCAP 143* 147* 97 118* 127*     Micro Results: Recent Results (from the past 240 hour(s))  Blood culture (routine x 2)     Status: None   Collection Time: 01/28/14  5:30 AM  Result Value Ref Range Status   Specimen Description BLOOD LEFT HAND  Final   Special Requests BOTTLES DRAWN AEROBIC ONLY [facility]  Final   Culture  Setup Time   Final    01/28/2014 14:53 Performed at [facility]    Culture   Final    NO GROWTH 5 DAYS Performed at [facility]    Report Status 02/03/2014 FINAL  Final  Blood culture (routine x 2)     Status: None   Collection Time: 01/28/14  5:38 AM  Result Value Ref Range Status   Specimen Description BLOOD LEFT ARM  Final   Special Requests BOTTLES DRAWN AEROBIC AND ANAEROBIC 10CC  Final   Culture  Setup Time   Final    01/28/2014 14:53 Performed at [facility]    Culture   Final    NO GROWTH 5 DAYS Performed at [facility]    Report Status 02/03/2014 FINAL  Final  Surgical pcr screen     Status: None   Collection Time: 01/28/14 11:20 AM  Result Value Ref Range Status   MRSA, PCR NEGATIVE NEGATIVE Final   Staphylococcus aureus NEGATIVE NEGATIVE Final    Comment:        The Xpert SA Assay (FDA approved for NASAL specimens in patients over 51 years of age), is one component of a comprehensive surveillance program.  Test performance has been validated by [employer] for patients greater than or equal  to 65 year old. It is not intended to diagnose infection nor to guide or monitor treatment.   Clostridium Difficile by PCR     Status: None   Collection Time: 02/04/14  8:00 AM  Result Value Ref Range Status   C difficile by pcr NEGATIVE NEGATIVE Final    Studies/Results: Ct Abdomen Pelvis Wo Contrast  02/03/2014   CLINICAL DATA:  Generalized abdominal pain.  EXAM: CT ABDOMEN AND PELVIS WITHOUT CONTRAST  TECHNIQUE: Multidetector CT imaging of the abdomen and pelvis was performed following the standard protocol without IV contrast.  COMPARISON:  CT scan of January 28, 2014.  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 Interval development of mild right pleural effusion with atelectasis or pneumonia of right lower lobe.  The spleen and pancreas appear normal. Adrenal glands and kidneys appear normal without hydronephrosis or renal obstruction. No renal or ureteral calculi are noted. Urinary bladder appears normal. The patient is status post cholecystectomy. Moderate amount of fluid is seen in the gallbladder fossa with associated surgical packing or Gel-Foam. However, biliary  leak cannot be excluded. Mildly dilated small bowel loops are noted which most likely represents postoperative ileus. Atherosclerotic calcifications of abdominal aorta and iliac arteries are noted without aneurysm formation. Urinary bladder appears normal. Appendix appears normal. Large amount of stool is noted in the right colon. Mild wall thickening of descending and sigmoid colon is noted suggesting colitis.  IMPRESSION: Interval development of mild right pleural effusion with associated atelectasis or pneumonia of right lower lobe.  Mildly dilated small bowel loops are noted most likely representing postoperative ileus.  Mild wall thickening of descending and sigmoid colon is noted suggesting possible colitis.  Status post cholecystectomy. Moderate amount of fluid is noted in the gallbladder fossa with  associated surgical packing material. However, biliary leak cannot be excluded, and HIDA scan is recommended for further evaluation.   Electronically Signed   By: Sabino Dick M.D.   On: 02/03/2014 21:47   Ct Head Wo Contrast  01/31/2014   CLINICAL DATA:  73 year old male with acute encephalopathy and confusion  EXAM: CT HEAD WITHOUT CONTRAST  TECHNIQUE: Contiguous axial images were obtained from the base of the skull through the vertex without intravenous contrast.  COMPARISON:  Most recent prior MRI of the brain 03/28/2010  FINDINGS: Negative for acute intracranial hemorrhage, acute infarction, mass, mass effect, hydrocephalus or midline shift. Gray-white differentiation is preserved throughout. Generalized cerebral and cerebellar cortical atrophy. Mild periventricular hypoattenuation most consistent with chronic microvascular ischemic white matter disease. Focal hypoattenuation in the right thalamus suggests remote lacunar infarct. No focal soft tissue or calvarial abnormality. Globes and orbits are symmetric and intact bilaterally. Normal aeration of the mastoid air cells. Polyp versus mucous retention cyst in the inferior aspect of the left maxillary sinus. Mild mucoperiosteal thickening in the right sphenoid air cell and scattered left frontal ethmoid sinuses.  IMPRESSION: 1. No acute intracranial abnormality. 2. Atrophy, chronic microvascular ischemic white matter disease and remote right thalamic lacunar infarct versus dilated perivascular space without significant interval progression comparing across modalities to the brain MRI dated 03/28/2010. 3. Mild inflammatory paranasal sinus disease has a chronic appearance.   Electronically Signed   By: Jacqulynn Cadet M.D.   On: 01/31/2014 15:59   Nm Hepatobiliary Including Gb  01/29/2014   CLINICAL DATA:  Cholelithiasis and clinical evidence of probable cholecystitis.  EXAM: NUCLEAR MEDICINE HEPATOBILIARY IMAGING  TECHNIQUE: Sequential images of the  abdomen were obtained out to 60 minutes following intravenous administration of radiopharmaceutical.  RADIOPHARMACEUTICALS:  5.0 [place] CV-89F Choletec. During the procedure, the patient also received 3.7 mg of IV morphine.  COMPARISON:  Abdominal ultrasound on 01/28/2014 and CTA of the abdomen on 01/28/2014.  FINDINGS: Imaging demonstrates normal uptake of radiopharmaceutical by the liver and normal biliary excretion with prompt visualization of the small bowel. There is nonvisualization of the gallbladder over 60 min. After further administration of IV morphine, there is continued nonvisualization of the gallbladder over 30 min. Findings are suggestive of cholecystitis.  IMPRESSION: Positive hepatobiliary imaging demonstrating nonvisualization of the gallbladder prior to and following administration of IV morphine. Findings are consistent with cholecystitis.   Electronically Signed   By: Aletta Edouard M.D.   On: 01/29/2014 10:47   Ct Abdomen Pelvis W Contrast  01/21/2014   CLINICAL DATA:  Abdominal pain for 6 weeks  EXAM: CT ABDOMEN AND PELVIS WITH CONTRAST  TECHNIQUE: Multidetector CT imaging of the abdomen and pelvis was performed using the standard protocol following bolus administration of intravenous contrast.  CONTRAST:  149mL OMNIPAQUE IOHEXOL 300 MG/ML  SOLN  COMPARISON:  07/03/2006  FINDINGS: The lung bases are free of acute infiltrate or sizable effusion.  The liver is diffusely fatty infiltrated. The spleen, adrenal glands and pancreas are normal in their CT appearance. The gallbladder is well distended and demonstrates multiple small gallstones. No wall thickening or pericholecystic fluid is noted. Kidneys are well visualized bilaterally and demonstrate some renal cystic change in the upper pole on the right. No renal calculi or obstructive changes are seen.  The appendix is not well visualized although no inflammatory changes to suggest appendicitis are noted. Diverticular change of the  colon is seen without definitive diverticulitis. The bladder is well distended. The prostate indents upon the inferior aspect of the bladder. Degenerative changes of the lumbar spine are seen. No pelvic mass lesion is noted. Aortoiliac calcifications are noted without aneurysmal dilatation.  IMPRESSION: Cholelithiasis without acute abnormality.  No acute abnormality is noted.   Electronically Signed   By: Inez Catalina M.D.   On: 01/21/2014 07:51   US Abdomen Limited  01/28/2014   CLINICAL DATA:  Initial evaluation for right upper quadrant pain.  EXAM: US ABDOMEN LIMITED - RIGHT UPPER QUADRANT  COMPARISON:  Prior CT from 01/21/2014  FINDINGS: Gallbladder:  Multiple echogenic shadowing stones present within the gallbladder lumen measuring up to 1.3 cm in diameter. Several of these appeared adherent to the gallbladder wall. Probable gallbladder sludge present as well. Gallbladder wall minimally thickened to 4 mm. No free pericholecystic fluid. No sonographic Murphy sign elicited on exam.  Common bile duct:  Diameter: 7.6 mm, within normal limits for patient age. No intrahepatic biliary dilatation.  Liver:  No focal lesion identified. Within normal limits in parenchymal echogenicity.  IMPRESSION: 1. Stones with sludge within the gallbladder lumen with minimal gallbladder wall thickening. Several of the stones appeared adherent to the gallbladder wall. No overt sonographic evidence for acute cholecystitis. 2. No biliary dilatation identified.   Electronically Signed   By: Jeannine Boga M.D.   On: 01/28/2014 03:58   Dg Abd 2 Views  02/03/2014   CLINICAL DATA:  Recent cholecystectomy now with abdominal distention; evaluate patient for ileus versus obstruction.  EXAM: ABDOMEN - 2 VIEW  COMPARISON:  None.  FINDINGS: There is a moderate volume of gas within the colon as well as within the small bowel. Numerous air-fluid levels are demonstrated. There is stool in the right colon. There is no definite gas within  the rectum. There are degenerative changes of the lumbar spine.  IMPRESSION: The bowel gas pattern most compatible with a diffuse ileus. There is no evidence of perforation.   Electronically Signed   By: David  Martinique   On: 02/03/2014 14:51   Ct Angio Abd/pel W/ And/or W/o  01/28/2014   CLINICAL DATA:  In the extreme abdominal pain and abdominal distention. Nausea. Generalized abdominal pain.  EXAM: CTA ABDOMEN AND PELVIS wITHOUT AND WITH CONTRAST  TECHNIQUE: Multidetector CT imaging of the abdomen and pelvis was performed using the standard protocol during bolus administration of intravenous contrast. Multiplanar reconstructed images and MIPs were obtained and reviewed to evaluate the vascular anatomy.  CONTRAST:  13mL OMNIPAQUE IOHEXOL 350 MG/ML SOLN  COMPARISON:  Ultrasound abdomen 01/28/2014. CT abdomen and pelvis 01/21/2014.  FINDINGS: CT images obtained during the angiographic phase after contrast administration demonstrate diffuse calcification and mild mural thrombus/atherosclerotic changes in the abdominal aorta. No evidence of aneurysm or dissection. The abdominal aorta, celiac axis, superior mesenteric artery, single bilateral renal arteries, inferior mesenteric artery, and bilateral  common iliac, external iliac, internal iliac, and common femoral arteries are patent.  Mild dependent changes in the lung bases. Small pericardial effusion or thickening. Thickening of the wall of the distal esophagus may indicate reflux esophagitis.  Cholelithiasis with gas in the gallbladder and mild gallbladder wall thickening. Appearance is suspicious for gangrenous cholecystitis. No bile duct dilatation. The liver, spleen, pancreas, adrenal glands, kidneys, inferior vena cava, and retroperitoneal lymph nodes appear unremarkable. Stomach is mildly distended with a fluid. No gastric wall thickening. Small bowel are decompressed. Diffusely stool-filled colon without distention. No free air or free fluid in the abdomen.   Pelvis: Prostate gland is enlarged. Bladder wall is mildly thickened which may be due to bladder wall hypertrophy or infection. Correlate clinically for signs of cystitis. Diverticulosis of the sigmoid colon. No evidence of diverticulitis. The appendix is normal. No free or loculated pelvic fluid collections. No pelvic mass or lymphadenopathy. Degenerative changes in the lumbar spine. No destructive bone lesions appreciated.  Review of the MIP images confirms the above findings.  IMPRESSION: No evidence of abdominal aortic aneurysm or dissection. Abdominal aorta and major branch vessels are patent.  Cholelithiasis with gas in the gallbladder and mild gallbladder wall thickening worrisome for gangrenous cholecystitis.   Electronically Signed   By: Lucienne Capers M.D.   On: 01/28/2014 04:58    Medications: Scheduled Meds: . amLODipine  10 mg Oral Daily  . cefTRIAXone (ROCEPHIN)  IV  2 g Intravenous Q24H  . fluorometholone  1 drop Left Eye QID  . hydrocortisone  25 mg Rectal BID  . insulin aspart  0-5 Units Subcutaneous QHS  . insulin aspart  0-9 Units Subcutaneous TID WC  . metoprolol  50 mg Oral Daily  . metronidazole  500 mg Intravenous 3 times per day  . olopatadine  1 drop Both Eyes Daily  . pantoprazole  40 mg Oral Daily  . polyethylene glycol  17 g Oral BID  . tamsulosin  0.4 mg Oral QPC supper      LOS: 7 days   Isidoro Santillana M.D. [facility] 02/04/2014, 11:20 AM Pager: [phone]  If 7PM-7AM, please contact night-coverage [email or web address] Password [email or web address]

## 2014-02-05 DIAGNOSIS — K567 Ileus, unspecified: Secondary | ICD-10-CM

## 2014-02-05 DIAGNOSIS — K810 Acute cholecystitis: Secondary | ICD-10-CM

## 2014-02-05 LAB — CBC
HCT: 30.7 % — ABNORMAL LOW (ref 39.0–52.0)
Hemoglobin: 9.7 g/dL — ABNORMAL LOW (ref 13.0–17.0)
MCH: 26.2 pg (ref 26.0–34.0)
MCHC: 31.6 g/dL (ref 30.0–36.0)
MCV: 83 fL (ref 78.0–100.0)
Platelets: 299 10*3/uL (ref 150–400)
RBC: 3.7 MIL/uL — ABNORMAL LOW (ref 4.22–5.81)
RDW: 15.4 % (ref 11.5–15.5)
WBC: 6.7 10*3/uL (ref 4.0–10.5)

## 2014-02-05 LAB — BASIC METABOLIC PANEL
Anion gap: 12 (ref 5–15)
BUN: 15 mg/dL (ref 6–23)
CO2: 27 mEq/L (ref 19–32)
Calcium: 8.1 mg/dL — ABNORMAL LOW (ref 8.4–10.5)
Chloride: 102 mEq/L (ref 96–112)
Creatinine, Ser: 1.16 mg/dL (ref 0.50–1.35)
GFR calc Af Amer: 70 mL/min — ABNORMAL LOW (ref >90)
GFR calc non Af Amer: 60 mL/min — ABNORMAL LOW (ref >90)
Glucose, Bld: 102 mg/dL — ABNORMAL HIGH (ref 70–99)
Potassium: 3.2 mEq/L — ABNORMAL LOW (ref 3.7–5.3)
Sodium: 141 mEq/L (ref 137–147)

## 2014-02-05 LAB — GLUCOSE, CAPILLARY
Glucose-Capillary: 111 mg/dL — ABNORMAL HIGH (ref 70–99)
Glucose-Capillary: 181 mg/dL — ABNORMAL HIGH (ref 70–99)
Glucose-Capillary: 90 mg/dL (ref 70–99)
Glucose-Capillary: 116 mg/dL — ABNORMAL HIGH (ref 70–99)
Glucose-Capillary: 138 mg/dL — ABNORMAL HIGH (ref 70–99)

## 2014-02-05 MED ORDER — POTASSIUM CHLORIDE CRYS ER 20 MEQ PO TBCR
40.0000 meq | EXTENDED_RELEASE_TABLET | Freq: Two times a day (BID) | ORAL | Status: AC
  Administered 2014-02-05 (×2): 40 meq via ORAL
  Filled 2014-02-05 (×2): qty 2

## 2014-02-05 NOTE — Progress Notes (Signed)
Pt had one small loose brown BM mixed with urine.

## 2014-02-05 NOTE — Plan of Care (Signed)
Problem: Phase III Progression Outcomes Goal: Voiding independently Outcome: Completed/Met Date Met:  02/05/14

## 2014-02-05 NOTE — Progress Notes (Addendum)
Patient had 4 soft brown bowel movements today from am- pm each larger in size than the last. There was no visible signs of blood.

## 2014-02-05 NOTE — Progress Notes (Signed)
Spoke with patient regarding PICC line insertion.  Reviewed risks and benefits of PICC and risks of NOT having PICC.  Pt adamantly refuses to have PICC placed.  Floor RN notified.

## 2014-02-05 NOTE — Progress Notes (Signed)
Called re: loss of IV access.  Mr. Dini still requires IV antibiotics so I have asked for a midline PICC to be placed.  If IV medications missed this evening, IV flagyl can be changed to PO, however, he is still struggling with a post op ileus and this may not be well absorbed.     Gilles Chiquito, MD

## 2014-02-05 NOTE — Progress Notes (Signed)
[facility] PROGRESS NOTE  Peter Mann ZWC:[id number] DOB: December 04, 1940 DOA: 01/28/2014 PCP: Peter Arabian, MD  Brief narrative: Peter Mann is a 73yo man with PMH of HTN, HLD, vertigo, BPH who presented to the ED with abdominal pain on 01/28/14.  CT scan showed possible gangrenous cholecystitis.  HIDA scan positive for cholecystitis.  Patient underwent laparoscopic cholecystectomy 01/30/14.  Course complicated by abdominal distention, constipation, confusion.  Enema on 11/26 resulted in BRBPR.  GI is consulted.  CT scan of abdomen shows post op ileus, mild right pleural effusion, thickening of descending, sigmoid colon suggestive of colitis.  On Flagyl.   Assessment and Plan:    Abdominal pain with acute cholecystitis, abdominal distention, BRBPR - Post op day 6 today - Abdomen improved, confusion improved, Hgb slightly down, but likely stable, no nausea or vomiting - GI has seen the patient and reports likely post op ileus, continue to monitor - IV flagyl for possible colitis seen on CT scan, also on rocephin started 11/21, presumably for cholecystitis.  Will need to discuss with surgery length of therapy.  Now on day 8.  - No need for inpatient colonoscopy - Check H/H tomorrow - Diet carb modified, monitor for any worsening of abdominal pain and constipation.   Essential hypertension - BP at goal on norvasc, metoprolol  Hyperglycemia - Hgb A1C was 8.8, on sliding scale insulin - need to consider oral medications once ileus improved  BPH - Continue flomax  Delerium, confusion - Oriented X 3 this morning, appears improved - Xanax PRN anxiety - If delerium continues, consider weaning xanax, possible med effect  DVT prophylaxis  SCDs, ambulation  Code Status: Full Family Communication: Pt at bedside Disposition Plan: PT recommended SNF, however, Patient prefers to go home.  Will sort out in the next day or two.    IV Access:   Peripheral IV Procedures and  diagnostic studies:   Ct Abdomen Pelvis Wo Contrast  02/03/2014   CLINICAL DATA:  Generalized abdominal pain.  EXAM: CT ABDOMEN AND PELVIS WITHOUT CONTRAST  TECHNIQUE: Multidetector CT imaging of the abdomen and pelvis was performed following the standard protocol without IV contrast.  COMPARISON:  CT scan of January 28, 2014.  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 Interval development of mild right pleural effusion with atelectasis or pneumonia of right lower lobe.  The spleen and pancreas appear normal. Adrenal glands and kidneys appear normal without hydronephrosis or renal obstruction. No renal or ureteral calculi are noted. Urinary bladder appears normal. The patient is status post cholecystectomy. Moderate amount of fluid is seen in the gallbladder fossa with associated surgical packing or Gel-Foam. However, biliary leak cannot be excluded. Mildly dilated small bowel loops are noted which most likely represents postoperative ileus. Atherosclerotic calcifications of abdominal aorta and iliac arteries are noted without aneurysm formation. Urinary bladder appears normal. Appendix appears normal. Large amount of stool is noted in the right colon. Mild wall thickening of descending and sigmoid colon is noted suggesting colitis.  IMPRESSION: Interval development of mild right pleural effusion with associated atelectasis or pneumonia of right lower lobe.  Mildly dilated small bowel loops are noted most likely representing postoperative ileus.  Mild wall thickening of descending and sigmoid colon is noted suggesting possible colitis.  Status post cholecystectomy. Moderate amount of fluid is noted in the gallbladder fossa with associated surgical packing material. However, biliary leak cannot be excluded, and HIDA scan is recommended for further evaluation.   Electronically Signed   By: Jeneen Rinks  Green M.D.   On: 02/03/2014 21:47   Dg Abd 2 Views  02/03/2014   CLINICAL DATA:  Recent  cholecystectomy now with abdominal distention; evaluate patient for ileus versus obstruction.  EXAM: ABDOMEN - 2 VIEW  COMPARISON:  None.  FINDINGS: There is a moderate volume of gas within the colon as well as within the small bowel. Numerous air-fluid levels are demonstrated. There is stool in the right colon. There is no definite gas within the rectum. There are degenerative changes of the lumbar spine.  IMPRESSION: The bowel gas pattern most compatible with a diffuse ileus. There is no evidence of perforation.   Electronically Signed   By: David  Martinique   On: 02/03/2014 14:51     Medical Consultants:   GI Surgery  Other Consultants:   Physical therapy  Anti-Infectives:   Rocephin 11/21 to current Flagyl 11/28 to current  Gilles Chiquito, MD  [facility] Pager [phone]  If 7PM-7AM, please contact night-coverage [email or web address] Password [email or web address] 02/05/2014, 8:10 AM   LOS: 8 days   HPI/Subjective: Reports he is breathing easier today, abdomen still feels swollen to him.  + mild nausea and abdominal pain.  He reports multiple BM yesterday, no blood seen.   Objective: Filed Vitals:   02/04/14 1011 02/04/14 1305 02/04/14 2136 02/05/14 0512  BP: 144/81 142/74 129/74 136/76  Pulse: 96 92 81 80  Temp:  98.4 F (36.9 C) 98.8 F (37.1 C) 98.3 F (36.8 C)  TempSrc:  Oral Axillary Axillary  Resp:  16 18 17   Height:      Weight:      SpO2:  96% 94% 95%    Intake/Output Summary (Last 24 hours) at 02/05/14 0810 Last data filed at 02/05/14 0604  Gross per 24 hour  Intake 1656.33 ml  Output    400 ml  Net 1256.33 ml    Exam:   General:  Pt is alert, follows commands appropriately, not in acute distress, oriented to person, place, time  Cardiovascular: Regular rate and rhythm, S1/S2, no murmurs  Respiratory: course rhonchi in bases bilaterally, no wheezing  Abdomen: Distended, somewhat soft but patient sitting in chair, non tender, bowel sounds hypoactive, no guarding  Extremities: No  edema, pulses DP and PT palpable bilaterally  Neuro: Grossly nonfocal  Data Reviewed: Basic Metabolic Panel:  Recent Labs Lab 01/30/14 0545 01/31/14 0420 02/03/14 1510 02/04/14 0936 02/05/14 0500  NA 133* 135* 137 136* 141  K 3.5* 3.4* 3.9 3.6* 3.2*  CL 96 98 101 100 102  CO2 21 23 23 24 27   GLUCOSE 147* 140* 125* 171* 102*  BUN 18 21 18 14 15   CREATININE 1.21 1.46* 1.11 1.13 1.16  CALCIUM 8.4 8.3* 8.2* 8.4 8.1*   Liver Function Tests:  Recent Labs Lab 01/29/14 0900 01/31/14 0420 02/03/14 1510  AST 15 26 19   ALT 12 18 15   ALKPHOS 96 81 73  BILITOT 0.7 0.4 0.3  PROT 6.9 6.0 5.9*  ALBUMIN 3.3* 2.4* 2.1*   CBC:  Recent Labs Lab 01/30/14 0545 01/31/14 0420 02/01/14 0525 02/03/14 0305 02/05/14 0500  WBC 14.7* 10.2 9.1 8.2 6.7  HGB 12.2* 11.3* 11.6* 11.1* 9.7*  HCT 36.8* 34.6* 35.6* 33.5* 30.7*  MCV 81.1 82.2 80.7 81.3 83.0  PLT 157 184 208 248 299   CBG:  Recent Labs Lab 02/04/14 0717 02/04/14 1141 02/04/14 1734 02/04/14 2137 02/05/14 0723  GLUCAP 127* 145* 113* 150* 111*    Recent Results (from the past 240 hour(s))  Blood culture (  routine x 2)     Status: None   Collection Time: 01/28/14  5:30 AM  Result Value Ref Range Status   Specimen Description BLOOD LEFT HAND  Final   Special Requests BOTTLES DRAWN AEROBIC ONLY [facility]  Final   Culture  Setup Time   Final    01/28/2014 14:53 Performed at [facility]    Culture   Final    NO GROWTH 5 DAYS Performed at [facility]    Report Status 02/03/2014 FINAL  Final  Blood culture (routine x 2)     Status: None   Collection Time: 01/28/14  5:38 AM  Result Value Ref Range Status   Specimen Description BLOOD LEFT ARM  Final   Special Requests BOTTLES DRAWN AEROBIC AND ANAEROBIC 10CC  Final   Culture  Setup Time   Final    01/28/2014 14:53 Performed at [facility]    Culture   Final    NO GROWTH 5 DAYS Performed at [facility]    Report Status 02/03/2014  FINAL  Final  Surgical pcr screen     Status: None   Collection Time: 01/28/14 11:20 AM  Result Value Ref Range Status   MRSA, PCR NEGATIVE NEGATIVE Final   Staphylococcus aureus NEGATIVE NEGATIVE Final    Comment:        The Xpert SA Assay (FDA approved for NASAL specimens in patients over 78 years of age), is one component of a comprehensive surveillance program.  Test performance has been validated by [employer] for patients greater than or equal to 31 year old. It is not intended to diagnose infection nor to guide or monitor treatment.   Clostridium Difficile by PCR     Status: None   Collection Time: 02/04/14  8:00 AM  Result Value Ref Range Status   C difficile by pcr NEGATIVE NEGATIVE Final     Scheduled Meds: . amLODipine  10 mg Oral Daily  . cefTRIAXone (ROCEPHIN)  IV  2 g Intravenous Q24H  . fluorometholone  1 drop Left Eye QID  . hydrocortisone  25 mg Rectal BID  . insulin aspart  0-5 Units Subcutaneous QHS  . insulin aspart  0-9 Units Subcutaneous TID WC  . metoprolol  50 mg Oral Daily  . metronidazole  500 mg Intravenous 3 times per day  . olopatadine  1 drop Both Eyes Daily  . pantoprazole  40 mg Oral Daily  . polyethylene glycol  17 g Oral BID  . tamsulosin  0.4 mg Oral QPC supper   Continuous Infusions:

## 2014-02-05 NOTE — Progress Notes (Signed)
[facility] Progress Note  Subjective: 3 stools overnight with no visible blood or melena according to nurse, abdomen feels less distended.  Objective: Vital signs in last 24 hours: Temp:  [98.3 F (36.8 C)-98.8 F (37.1 C)] 98.3 F (36.8 C) (11/29 0512) Pulse Rate:  [80-92] 80 (11/29 0512) Resp:  [16-18] 17 (11/29 0512) BP: (129-142)/(74-76) 136/76 mmHg (11/29 0512) SpO2:  [94 %-96 %] 95 % (11/29 0512) Weight change:    PE: Abdomen modestly distended  Lab Results: Results for orders placed or performed during the hospital encounter of 01/28/14 (from the past 24 hour(s))  Glucose, capillary     Status: Abnormal   Collection Time: 02/04/14  5:34 PM  Result Value Ref Range   Glucose-Capillary 113 (H) 70 - 99 mg/dL  Glucose, capillary     Status: Abnormal   Collection Time: 02/04/14  9:37 PM  Result Value Ref Range   Glucose-Capillary 150 (H) 70 - 99 mg/dL   Comment 1 Notify RN   CBC     Status: Abnormal   Collection Time: 02/05/14  5:00 AM  Result Value Ref Range   WBC 6.7 4.0 - 10.5 K/uL   RBC 3.70 (L) 4.22 - 5.81 MIL/uL   Hemoglobin 9.7 (L) 13.0 - 17.0 g/dL   HCT 30.7 (L) 39.0 - 52.0 %   MCV 83.0 78.0 - 100.0 fL   MCH 26.2 26.0 - 34.0 pg   MCHC 31.6 30.0 - 36.0 g/dL   RDW 15.4 11.5 - 15.5 %   Platelets 299 150 - 400 K/uL  Basic metabolic panel     Status: Abnormal   Collection Time: 02/05/14  5:00 AM  Result Value Ref Range   Sodium 141 137 - 147 mEq/L   Potassium 3.2 (L) 3.7 - 5.3 mEq/L   Chloride 102 96 - 112 mEq/L   CO2 27 19 - 32 mEq/L   Glucose, Bld 102 (H) 70 - 99 mg/dL   BUN 15 6 - 23 mg/dL   Creatinine, Ser 1.16 0.50 - 1.35 mg/dL   Calcium 8.1 (L) 8.4 - 10.5 mg/dL   GFR calc non Af Amer 60 (L) >90 mL/min   GFR calc Af Amer 70 (L) >90 mL/min   Anion gap 12 5 - 15  Glucose, capillary     Status: Abnormal   Collection Time: 02/05/14  7:23 AM  Result Value Ref Range   Glucose-Capillary 111 (H) 70 - 99 mg/dL  Glucose, capillary     Status:  Abnormal   Collection Time: 02/05/14 11:25 AM  Result Value Ref Range   Glucose-Capillary 181 (H) 70 - 99 mg/dL    Studies/Results: Ct Abdomen Pelvis Wo Contrast  02/03/2014   CLINICAL DATA:  Generalized abdominal pain.  EXAM: CT ABDOMEN AND PELVIS WITHOUT CONTRAST  TECHNIQUE: Multidetector CT imaging of the abdomen and pelvis was performed following the standard protocol without IV contrast.  COMPARISON:  CT scan of January 28, 2014.  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 Interval development of mild right pleural effusion with atelectasis or pneumonia of right lower lobe.  The spleen and pancreas appear normal. Adrenal glands and kidneys appear normal without hydronephrosis or renal obstruction. No renal or ureteral calculi are noted. Urinary bladder appears normal. The patient is status post cholecystectomy. Moderate amount of fluid is seen in the gallbladder fossa with associated surgical packing or Gel-Foam. However, biliary leak cannot be excluded. Mildly dilated small bowel loops are noted which most likely represents postoperative ileus.  Atherosclerotic calcifications of abdominal aorta and iliac arteries are noted without aneurysm formation. Urinary bladder appears normal. Appendix appears normal. Large amount of stool is noted in the right colon. Mild wall thickening of descending and sigmoid colon is noted suggesting colitis.  IMPRESSION: Interval development of mild right pleural effusion with associated atelectasis or pneumonia of right lower lobe.  Mildly dilated small bowel loops are noted most likely representing postoperative ileus.  Mild wall thickening of descending and sigmoid colon is noted suggesting possible colitis.  Status post cholecystectomy. Moderate amount of fluid is noted in the gallbladder fossa with associated surgical packing material. However, biliary leak cannot be excluded, and HIDA scan is recommended for further evaluation.   Electronically  Signed   By: Sabino Dick M.D.   On: 02/03/2014 21:47   Dg Abd 2 Views  02/03/2014   CLINICAL DATA:  Recent cholecystectomy now with abdominal distention; evaluate patient for ileus versus obstruction.  EXAM: ABDOMEN - 2 VIEW  COMPARISON:  None.  FINDINGS: There is a moderate volume of gas within the colon as well as within the small bowel. Numerous air-fluid levels are demonstrated. There is stool in the right colon. There is no definite gas within the rectum. There are degenerative changes of the lumbar spine.  IMPRESSION: The bowel gas pattern most compatible with a diffuse ileus. There is no evidence of perforation.   Electronically Signed   By: David  Martinique   On: 02/03/2014 14:51      Assessment: Postoperative ileus/constipation with reported rectal bleeding which appears to be self-limited  Plan: Advance diet as tolerated. Would not pursue any endoscopic workup unless bleeding recurs or persists with drop in hemoglobin. Continue Mira lax when necessary. We'll sign off for now.    Jaclene Bartelt C 02/05/2014, 12:01 PM

## 2014-02-05 NOTE — Progress Notes (Signed)
Pt had one small loose brown stool.  No blood visible.

## 2014-02-05 NOTE — Plan of Care (Signed)
Problem: Discharge Progression Outcomes Goal: Tolerating diet Outcome: Completed/Met Date Met:  02/05/14

## 2014-02-05 NOTE — Progress Notes (Signed)
Patient had one medium loose brown stool; no visible blood.

## 2014-02-06 DIAGNOSIS — R1084 Generalized abdominal pain: Secondary | ICD-10-CM

## 2014-02-06 DIAGNOSIS — R140 Abdominal distension (gaseous): Secondary | ICD-10-CM

## 2014-02-06 LAB — BASIC METABOLIC PANEL
Anion gap: 14 (ref 5–15)
Anion gap: 12 (ref 5–15)
BUN: 16 mg/dL (ref 6–23)
BUN: 14 mg/dL (ref 6–23)
CO2: 24 mEq/L (ref 19–32)
CO2: 25 mEq/L (ref 19–32)
Calcium: 8 mg/dL — ABNORMAL LOW (ref 8.4–10.5)
Calcium: 8.9 mg/dL (ref 8.4–10.5)
Chloride: 102 mEq/L (ref 96–112)
Chloride: 103 mEq/L (ref 96–112)
Creatinine, Ser: 1.01 mg/dL (ref 0.50–1.35)
Creatinine, Ser: 1.09 mg/dL (ref 0.50–1.35)
GFR calc Af Amer: 82 mL/min — ABNORMAL LOW (ref >90)
GFR calc Af Amer: 75 mL/min — ABNORMAL LOW (ref >90)
GFR calc non Af Amer: 71 mL/min — ABNORMAL LOW (ref >90)
GFR calc non Af Amer: 65 mL/min — ABNORMAL LOW (ref >90)
Glucose, Bld: 100 mg/dL — ABNORMAL HIGH (ref 70–99)
Glucose, Bld: 135 mg/dL — ABNORMAL HIGH (ref 70–99)
Potassium: 3.3 mEq/L — ABNORMAL LOW (ref 3.7–5.3)
Potassium: 4.5 mEq/L (ref 3.7–5.3)
Sodium: 140 mEq/L (ref 137–147)
Sodium: 140 mEq/L (ref 137–147)

## 2014-02-06 LAB — HEMOGLOBIN AND HEMATOCRIT, BLOOD
HCT: 33.8 % — ABNORMAL LOW (ref 39.0–52.0)
Hemoglobin: 10.6 g/dL — ABNORMAL LOW (ref 13.0–17.0)

## 2014-02-06 LAB — GLUCOSE, CAPILLARY
Glucose-Capillary: 103 mg/dL — ABNORMAL HIGH (ref 70–99)
Glucose-Capillary: 147 mg/dL — ABNORMAL HIGH (ref 70–99)

## 2014-02-06 MED ORDER — METFORMIN HCL 500 MG PO TABS
500.0000 mg | ORAL_TABLET | Freq: Every day | ORAL | Status: DC
  Administered 2014-02-06: 500 mg via ORAL
  Filled 2014-02-06: qty 1

## 2014-02-06 MED ORDER — POTASSIUM CHLORIDE CRYS ER 20 MEQ PO TBCR
40.0000 meq | EXTENDED_RELEASE_TABLET | ORAL | Status: AC
  Administered 2014-02-06 (×2): 40 meq via ORAL
  Filled 2014-02-06 (×2): qty 2

## 2014-02-06 MED ORDER — METFORMIN HCL 500 MG PO TABS: 500.0000 mg | ORAL_TABLET | Freq: Every day | ORAL | Status: DC

## 2014-02-06 MED ORDER — METRONIDAZOLE 500 MG PO TABS
500.0000 mg | ORAL_TABLET | Freq: Three times a day (TID) | ORAL | Status: DC
  Administered 2014-02-06 (×2): 500 mg via ORAL
  Filled 2014-02-06 (×2): qty 1

## 2014-02-06 MED ORDER — POTASSIUM CHLORIDE CRYS ER 20 MEQ PO TBCR: 80.0000 meq | EXTENDED_RELEASE_TABLET | Freq: Once | ORAL | Status: DC

## 2014-02-06 MED ORDER — POLYETHYLENE GLYCOL 3350 17 G PO PACK: 17.0000 g | PACK | Freq: Every day | ORAL | Status: DC | PRN

## 2014-02-06 MED ORDER — METRONIDAZOLE 500 MG PO TABS: 500.0000 mg | ORAL_TABLET | Freq: Three times a day (TID) | ORAL | Status: DC

## 2014-02-06 NOTE — Progress Notes (Signed)
Physical Therapy Treatment Patient Details Name: Peter Mann MRN: [id number] DOB: Apr 02, 1940 Today's Date: 02/06/2014    History of Present Illness 73 yo male with abd pain and received laparoscopic cholecystectomy and new dx DM.  Has medical management ongoing     PT Comments    Patient progressing well with mobility however continues to exhibit balance deficits and poor safety awareness. Pt refuses to use RW for mobility. Required Min A during stair negotiation due to LOB. Education provided on importance of using RW at home for safety. Will continue to follow acutely.   Follow Up Recommendations  SNF;Supervision/Assistance - 24 hour     Equipment Recommendations  Rolling walker with 5" wheels;3in1 (PT)    Recommendations for Other Services       Precautions / Restrictions Precautions Precautions: Fall Restrictions Weight Bearing Restrictions: No    Mobility  Bed Mobility               General bed mobility comments: Received sitting in chair upon PT arrival.   Transfers Overall transfer level: Needs assistance Equipment used: None Transfers: Sit to/from Stand Sit to Stand: Min guard         General transfer comment: Min guard to stand for safety as pt impulsive.   Ambulation/Gait Ambulation/Gait assistance: Min assist Ambulation Distance (Feet): 300 Feet Assistive device: None Gait Pattern/deviations: Step-through pattern;Shuffle;Narrow base of support;Decreased stride length     General Gait Details: Anterior lean during gait with increased speed and forward momentum. Shuffling Bil feet requiring Min guard for safety. No overt LOB but unsteady requiring Min A to prevent tripping forward.   Stairs Stairs: Yes Stairs assistance: Min assist Stair Management: One rail Right;Alternating pattern Number of Stairs: 12 General stair comments: Min A x1 on descent as pt with partial knee buckling RLE to prevent fall. Cues to decrease speed for safety and  perform step to gait pattern. Pt continued to utilize alternating pattern.  Wheelchair Mobility    Modified Rankin (Stroke Patients Only)       Balance Overall balance assessment: Needs assistance Sitting-balance support: Feet supported;No upper extremity supported Sitting balance-Leahy Scale: Good     Standing balance support: During functional activity Standing balance-Leahy Scale: Fair Standing balance comment: Pt declined using RW for mobility. MIldly unsteady during dynamic standing but able to perform static standing without difficulty.                     Cognition Arousal/Alertness: Awake/alert Behavior During Therapy: Impulsive Overall Cognitive Status: Within Functional Limits for tasks assessed                      Exercises      General Comments        Pertinent Vitals/Pain Pain Assessment: 0-10 Pain Score: 3  Pain Location: abdomen Pain Descriptors / Indicators: Aching Pain Intervention(s): Monitored during session;RN gave pain meds during session    Home Living                      Prior Function            PT Goals (current goals can now be found in the care plan section) Progress towards PT goals: Progressing toward goals    Frequency  Min 3X/week    PT Plan Current plan remains appropriate    Co-evaluation             End of Session Equipment Utilized During Treatment:  Gait belt Activity Tolerance: Patient tolerated treatment well Patient left: in bed;with call bell/phone within reach;with nursing/sitter in room;with family/visitor present (RN present giving meds)     Time: 1010-1026 PT Time Calculation (min) (ACUTE ONLY): 16 min  Charges:  $Gait Training: 8-22 mins                    G CodesCandy Sledge A 2014/02/25, 11:32 AM  Candy Sledge, PT, DPT [phone]

## 2014-02-06 NOTE — Progress Notes (Signed)
[facility] PROGRESS NOTE  Peter Mann ION:[id number] DOB: 11/19/1940 DOA: 01/28/2014 PCP: Gaynelle Arabian, MD  Brief narrative: Peter Mann is a 73yo man with PMH of HTN, HLD, vertigo, BPH who presented to the ED with abdominal pain on 01/28/14.  CT scan showed possible gangrenous cholecystitis.  HIDA scan positive for cholecystitis.  Patient underwent laparoscopic cholecystectomy 01/30/14.  Course complicated by abdominal distention, constipation, confusion.  Enema on 11/26 resulted in BRBPR.  GI is consulted.  CT scan of abdomen shows post op ileus, mild right pleural effusion, thickening of descending, sigmoid colon suggestive of colitis.  On Flagyl. Lost IV access, refused PICC on 11/29.  PIV placed in hand on 11/30. Rocephin d/c 11/30.  Flagyl changed to PO.   Assessment and Plan:    Abdominal pain with acute cholecystitis, abdominal distention, BRBPR - Post op day 7 today - Abdomen improved, confusion improved to baseline - GI has seen the patient and reports likely post op ileus - Patient with multiple small bowel movements yesterday without blood and reports abdomen is back to normal.  - Flagyl changed to PO for colitis, will go home with therapy.  Rocephin d/c'd.  He completed 8 days of therapy for his acute cholecystitis s/p surgery.   - No need for inpatient colonoscopy - Diet carb modified, monitor for any worsening of abdominal pain and constipation.   Essential hypertension - BP at goal to mildly elevated on norvasc, metoprolol - His home HCTZ has been placed on hold, he should see his PCP in 2 weeks post hospitalization for review of his blood pressure.    Hyperglycemia - Hgb A1C was 8.8, on sliding scale insulin - Started on metformin 500mg  daily with up titration in the outpatient setting.    Hypokalemia - K 3.3 today, received PO K yesterday - KCl 80 mEq today - Recheck BMET this afternoon  BPH - Continue flomax  Delerium, confusion - Oriented X 3 this  morning, appears improved - Xanax PRN anxiety  DVT prophylaxis  SCDs, ambulation  Code Status: Full Family Communication: Pt at bedside Disposition Plan: PT recommended SNF, however, Patient prefers to go home.  Will discharge home with home health PT/OT today.     IV Access:   Peripheral IV Procedures and diagnostic studies:    No results found.  Medical Consultants:   GI Surgery  Other Consultants:   Physical therapy  Anti-Infectives:   Rocephin 11/21 to 11/29 Flagyl 11/28 to current, end date 12/4  Gilles Chiquito, MD  [facility] Pager [phone]  If 7PM-7AM, please contact night-coverage [email or web address] Password [email or web address] 02/06/2014, 7:30 AM   LOS: 9 days   HPI/Subjective: Reports he is doing much better and that he would do "even better" if he went home.  Abdominal swelling is better per him.   Objective: Filed Vitals:   02/05/14 0512 02/05/14 1317 02/05/14 2202 02/06/14 0616  BP: 136/76 129/73 144/61 155/69  Pulse: 80 67 86 72  Temp: 98.3 F (36.8 C) 98 F (36.7 C) 98.8 F (37.1 C) 98.3 F (36.8 C)  TempSrc: Axillary Oral Oral   Resp: 17 17 17 16   Height:      Weight:      SpO2: 95% 98% 97% 98%    Intake/Output Summary (Last 24 hours) at 02/06/14 0730 Last data filed at 02/06/14 0502  Gross per 24 hour  Intake   1400 ml  Output      0 ml  Net   1400 ml    Exam:  General:  Alert, oriented, NAD  Cardiovascular: RR, NR, S1/S2, no murmurs  Respiratory: course rhonchi in base on right, improved on left, no wheezing  Abdomen: Distended, soft but patient sitting in chair, non tender, bowel sounds hypoactive, no guarding  Extremities: No edema, warm and dry  Neuro: Grossly nonfocal  Data Reviewed: Basic Metabolic Panel:  Recent Labs Lab 01/31/14 0420 02/03/14 1510 02/04/14 0936 02/05/14 0500 02/06/14 0300  NA 135* 137 136* 141 140  K 3.4* 3.9 3.6* 3.2* 3.3*  CL 98 101 100 102 102  CO2 23 23 24 27 24   GLUCOSE 140* 125* 171* 102* 100*  BUN 21  18 14 15 16   CREATININE 1.46* 1.11 1.13 1.16 1.01  CALCIUM 8.3* 8.2* 8.4 8.1* 8.0*   Liver Function Tests:  Recent Labs Lab 01/31/14 0420 02/03/14 1510  AST 26 19  ALT 18 15  ALKPHOS 81 73  BILITOT 0.4 0.3  PROT 6.0 5.9*  ALBUMIN 2.4* 2.1*   CBC:  Recent Labs Lab 01/31/14 0420 02/01/14 0525 02/03/14 0305 02/05/14 0500  WBC 10.2 9.1 8.2 6.7  HGB 11.3* 11.6* 11.1* 9.7*  HCT 34.6* 35.6* 33.5* 30.7*  MCV 82.2 80.7 81.3 83.0  PLT 184 208 248 299   CBG:  Recent Labs Lab 02/05/14 0723 02/05/14 1125 02/05/14 1705 02/05/14 2146 02/05/14 2200  GLUCAP 111* 181* 90 116* 138*    Recent Results (from the past 240 hour(s))  Blood culture (routine x 2)     Status: None   Collection Time: 01/28/14  5:30 AM  Result Value Ref Range Status   Specimen Description BLOOD LEFT HAND  Final   Special Requests BOTTLES DRAWN AEROBIC ONLY [facility]  Final   Culture  Setup Time   Final    01/28/2014 14:53 Performed at [facility]    Culture   Final    NO GROWTH 5 DAYS Performed at [facility]    Report Status 02/03/2014 FINAL  Final  Blood culture (routine x 2)     Status: None   Collection Time: 01/28/14  5:38 AM  Result Value Ref Range Status   Specimen Description BLOOD LEFT ARM  Final   Special Requests BOTTLES DRAWN AEROBIC AND ANAEROBIC 10CC  Final   Culture  Setup Time   Final    01/28/2014 14:53 Performed at [facility]    Culture   Final    NO GROWTH 5 DAYS Performed at [facility]    Report Status 02/03/2014 FINAL  Final  Surgical pcr screen     Status: None   Collection Time: 01/28/14 11:20 AM  Result Value Ref Range Status   MRSA, PCR NEGATIVE NEGATIVE Final   Staphylococcus aureus NEGATIVE NEGATIVE Final    Comment:        The Xpert SA Assay (FDA approved for NASAL specimens in patients over 34 years of age), is one component of a comprehensive surveillance program.  Test performance has been validated by [employer]  for patients greater than or equal to 76 year old. It is not intended to diagnose infection nor to guide or monitor treatment.   Clostridium Difficile by PCR     Status: None   Collection Time: 02/04/14  8:00 AM  Result Value Ref Range Status   C difficile by pcr NEGATIVE NEGATIVE Final     Scheduled Meds: . amLODipine  10 mg Oral Daily  . cefTRIAXone (ROCEPHIN)  IV  2 g Intravenous Q24H  . fluorometholone  1 drop Left Eye QID  . hydrocortisone  25 mg Rectal BID  . insulin aspart  0-5 Units Subcutaneous QHS  . insulin aspart  0-9 Units Subcutaneous TID WC  . metoprolol  50 mg Oral Daily  . metronidazole  500 mg Intravenous 3 times per day  . olopatadine  1 drop Both Eyes Daily  . pantoprazole  40 mg Oral Daily  . polyethylene glycol  17 g Oral BID  . tamsulosin  0.4 mg Oral QPC supper   Continuous Infusions:

## 2014-02-06 NOTE — Progress Notes (Signed)
Discussed discharge summary with patient. Reviewed all medications with patient. Patient received Rx. Patient ready for discharge. 

## 2014-02-06 NOTE — Progress Notes (Signed)
Pt had small brown loose stool;novisible blood.

## 2014-02-06 NOTE — Discharge Summary (Signed)
Physician Discharge Summary  Peter Mann VZD:[id number] DOB: [birth year] DOA: 01/28/2014  PCP: Peter Arabian, MD START 11/27 GI notes Admit date: 01/28/2014 Discharge date: 02/06/2014  Recommendations for Outpatient Follow-up:  1. Pt will need to follow up with PCP in 2-3 weeks post discharge 2. Please obtain BMP to evaluate electrolytes and kidney function 3. Please also check CBC to evaluate Hg and Hct levels 4. Follow up on home health recommendations, PT and OT.   Discharge Diagnoses:  Abdominal pain due to acute gangrenous cholecystitis  Post op ileus with BRBPR Colitis seen on CT scan Essential hypertension Diabetes Mellitus type 2, newly diagnosed Hypokalemia BPH Delerium/Confusion Gait instability  Discharge Condition: Stable  Diet recommendation: Heart healthy diet discussed in details   History of present illness:  Peter Mann is a 73yo man who presented with abdominal pain for 8 weeks, worst in the 1 week prior to admission.  He noted the pain as diffuse, 10/10, constant, sharp.  He had previously been seen in the ED on 11/14 for abdominal pain and found to have cholelithiasis, but thought to not be cholecystitis at the time.  He was treated with tramadol.  Ultrasound on 11/21 showed cholelithiasis and sludge with borderline thickened gallbladder wall, but CT abdomen on 11/21 showed possible gangrenous cholecystitis.  Surgery was consulted and he was admitted for evaluation.    Hospital Course:  Abdominal pain due to acute gangrenous cholecystitis: Peter Mann is a 73yo man who presented with abdominal pain for 8 weeks, worst in the 1 week prior to admission.  He had previously been seen in the ED on 11/14 for abdominal pain and found to have cholelithiasis, but thought to not be cholecystitis at the time.   Ultrasound at admission showed cholelithiasis with borderline thickened gallbladder wall, but CT abdomen showed possible gangrenous cholecystitis.  Surgery was  consulted and he was admitted for evaluation.  He was managed initially with NPO status, IVF, pain control and anti-emetics.  His LFTs were not elevated.  HIDA scan was performed on 11/22 showing cholecystitis and surgery was planned.  He underwent laparascopic cholecystectomy on 01/30/14.  He was placed on IV rocephin prior to surgery and this was continued for 8 days.  Surgery signed off on 11/27.  They will see in the outpatient setting.   Post op ileus with BRBPR: First noted on 01/31/14.  Initially placed on bowel regimen with dulcolax, senokot and OOB.  On 11/26 and 11/27, there was noted blood in his stool. Due to blood in stool and persistent abdominal distention, GI was consulted for evaluation on 11/27.  They advised abdominal xray and possibly a CT abdomen/pelvis.  Also noted that a trial of anusol could be done.  Abdominal xray showed diffuse ileus.  CT Abdomen showed most likely a post op ileus, plus possible descending and sigmoid colitis.  There was some concern as well for biliary leak, this could not be ruled out but could be post surgical.  GI recommended trending H/H (stabilized during hospitalization) and treatment for ileus with low dose miralax.  Due to the likely colitis, flagyl was started as well.  He was sent home to complete a 7 day course of this medication.  On 11/28 - 11/29 he began to have stools due to the medications.  GI signed off, recommending advancing diet after this.   He should be evaluated for improvement at PCP's office.  Colitis seen on CT scan: Started on flagyl as noted above.   Essential hypertension: BP  did well, had some lows.  He was continued on his amlodipine and metoprolol at discharge, HCTZ held given lower blood pressure at times. This should be evaluated further by his PCP.   Diabetes Mellitus type 2, newly diagnosed: During his stay, he was noted to have hyperglycemia and an A1C was checked which was 8.8.  He was not aware of a diagnosis of diabetes.   On discharge, he was started on Metformin 500mg  daily and advised to follow up with his PCP for further evaluation and monitoring of his DM.   Hypokalemia: Mild and intermittent, responded to supplementation  BPH: continued on home tamsulosin  Delerium/Confusion: Patient had some perioperative confusion.  Evaluation for UTI was negative.  CT head showed no acute abnormality, but with some chronic changes, possibly contributing to some dementia.  On 11/25, Psychiatry was consulted to evaluate for medical competency and he was deemed competent.  His confusion had cleared on day of discharge and he was adamant about wanting to go home with home health as he had "bills to pay."   He does have limited insight into his balance issues, but he reported that he would work with PT at home.  He is likely a moderate/high fall risk at home.  Gait instability: Evaluated by PT while in house and recommended SNF due to gait instability (see above).  He refused SNF and was discharged home with home health PT    Procedures/Studies: Ct Abdomen Pelvis Wo Contrast  02/03/2014   CLINICAL DATA:  Generalized abdominal pain.  EXAM: CT ABDOMEN AND PELVIS WITHOUT CONTRAST  TECHNIQUE: Multidetector CT imaging of the abdomen and pelvis was performed following the standard protocol without IV contrast.  COMPARISON:  CT scan of January 28, 2014.  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 Interval development of mild right pleural effusion with atelectasis or pneumonia of right lower lobe.  The spleen and pancreas appear normal. Adrenal glands and kidneys appear normal without hydronephrosis or renal obstruction. No renal or ureteral calculi are noted. Urinary bladder appears normal. The patient is status post cholecystectomy. Moderate amount of fluid is seen in the gallbladder fossa with associated surgical packing or Gel-Foam. However, biliary leak cannot be excluded. Mildly dilated small bowel loops are noted  which most likely represents postoperative ileus. Atherosclerotic calcifications of abdominal aorta and iliac arteries are noted without aneurysm formation. Urinary bladder appears normal. Appendix appears normal. Large amount of stool is noted in the right colon. Mild wall thickening of descending and sigmoid colon is noted suggesting colitis.  IMPRESSION: Interval development of mild right pleural effusion with associated atelectasis or pneumonia of right lower lobe.  Mildly dilated small bowel loops are noted most likely representing postoperative ileus.  Mild wall thickening of descending and sigmoid colon is noted suggesting possible colitis.  Status post cholecystectomy. Moderate amount of fluid is noted in the gallbladder fossa with associated surgical packing material. However, biliary leak cannot be excluded, and HIDA scan is recommended for further evaluation.   Electronically Signed   By: Sabino Dick M.D.   On: 02/03/2014 21:47   Ct Head Wo Contrast  01/31/2014   CLINICAL DATA:  73 year old male with acute encephalopathy and confusion  EXAM: CT HEAD WITHOUT CONTRAST  TECHNIQUE: Contiguous axial images were obtained from the base of the skull through the vertex without intravenous contrast.  COMPARISON:  Most recent prior MRI of the brain 03/28/2010  FINDINGS: Negative for acute intracranial hemorrhage, acute infarction, mass, mass effect, hydrocephalus  or midline shift. Gray-white differentiation is preserved throughout. Generalized cerebral and cerebellar cortical atrophy. Mild periventricular hypoattenuation most consistent with chronic microvascular ischemic white matter disease. Focal hypoattenuation in the right thalamus suggests remote lacunar infarct. No focal soft tissue or calvarial abnormality. Globes and orbits are symmetric and intact bilaterally. Normal aeration of the mastoid air cells. Polyp versus mucous retention cyst in the inferior aspect of the left maxillary sinus. Mild  mucoperiosteal thickening in the right sphenoid air cell and scattered left frontal ethmoid sinuses.  IMPRESSION: 1. No acute intracranial abnormality. 2. Atrophy, chronic microvascular ischemic white matter disease and remote right thalamic lacunar infarct versus dilated perivascular space without significant interval progression comparing across modalities to the brain MRI dated 03/28/2010. 3. Mild inflammatory paranasal sinus disease has a chronic appearance.   Electronically Signed   By: Jacqulynn Cadet M.D.   On: 01/31/2014 15:59   Nm Hepatobiliary Including Gb  01/29/2014   CLINICAL DATA:  Cholelithiasis and clinical evidence of probable cholecystitis.  EXAM: NUCLEAR MEDICINE HEPATOBILIARY IMAGING  TECHNIQUE: Sequential images of the abdomen were obtained out to 60 minutes following intravenous administration of radiopharmaceutical.  RADIOPHARMACEUTICALS:  5.0 [place] PR-91M Choletec. During the procedure, the patient also received 3.7 mg of IV morphine.  COMPARISON:  Abdominal ultrasound on 01/28/2014 and CTA of the abdomen on 01/28/2014.  FINDINGS: Imaging demonstrates normal uptake of radiopharmaceutical by the liver and normal biliary excretion with prompt visualization of the small bowel. There is nonvisualization of the gallbladder over 60 min. After further administration of IV morphine, there is continued nonvisualization of the gallbladder over 30 min. Findings are suggestive of cholecystitis.  IMPRESSION: Positive hepatobiliary imaging demonstrating nonvisualization of the gallbladder prior to and following administration of IV morphine. Findings are consistent with cholecystitis.   Electronically Signed   By: Aletta Edouard M.D.   On: 01/29/2014 10:47   Ct Abdomen Pelvis W Contrast  01/21/2014   CLINICAL DATA:  Abdominal pain for 6 weeks  EXAM: CT ABDOMEN AND PELVIS WITH CONTRAST  TECHNIQUE: Multidetector CT imaging of the abdomen and pelvis was performed using the standard protocol  following bolus administration of intravenous contrast.  CONTRAST:  163mL OMNIPAQUE IOHEXOL 300 MG/ML  SOLN  COMPARISON:  07/03/2006  FINDINGS: The lung bases are free of acute infiltrate or sizable effusion.  The liver is diffusely fatty infiltrated. The spleen, adrenal glands and pancreas are normal in their CT appearance. The gallbladder is well distended and demonstrates multiple small gallstones. No wall thickening or pericholecystic fluid is noted. Kidneys are well visualized bilaterally and demonstrate some renal cystic change in the upper pole on the right. No renal calculi or obstructive changes are seen.  The appendix is not well visualized although no inflammatory changes to suggest appendicitis are noted. 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 The bladder is well distended. The prostate indents upon the inferior aspect of the bladder. Degenerative changes of the lumbar spine are seen. No pelvic mass lesion is noted. Aortoiliac calcifications are noted without aneurysmal dilatation.  IMPRESSION: Cholelithiasis without acute abnormality.  No acute abnormality is noted.   Electronically Signed   By: Inez Catalina M.D.   On: 01/21/2014 07:51   US Abdomen Limited  01/28/2014   CLINICAL DATA:  Initial evaluation for right upper quadrant pain.  EXAM: US ABDOMEN LIMITED - RIGHT UPPER QUADRANT  COMPARISON:  Prior CT from 01/21/2014  FINDINGS: Gallbladder:  Multiple echogenic shadowing stones present within the gallbladder lumen measuring up  to 1.3 cm in diameter. Several of these appeared adherent to the gallbladder wall. Probable gallbladder sludge present as well. Gallbladder wall minimally thickened to 4 mm. No free pericholecystic fluid. No sonographic Murphy sign elicited on exam.  Common bile duct:  Diameter: 7.6 mm, within normal limits for patient age. No intrahepatic biliary dilatation.  Liver:  No focal lesion identified. Within normal limits in parenchymal  echogenicity.  IMPRESSION: 1. Stones with sludge within the gallbladder lumen with minimal gallbladder wall thickening. Several of the stones appeared adherent to the gallbladder wall. No overt sonographic evidence for acute cholecystitis. 2. No biliary dilatation identified.   Electronically Signed   By: Jeannine Boga M.D.   On: 01/28/2014 03:58   Dg Abd 2 Views  02/03/2014   CLINICAL DATA:  Recent cholecystectomy now with abdominal distention; evaluate patient for ileus versus obstruction.  EXAM: ABDOMEN - 2 VIEW  COMPARISON:  None.  FINDINGS: There is a moderate volume of gas within the colon as well as within the small bowel. Numerous air-fluid levels are demonstrated. There is stool in the right colon. There is no definite gas within the rectum. There are degenerative changes of the lumbar spine.  IMPRESSION: The bowel gas pattern most compatible with a diffuse ileus. There is no evidence of perforation.   Electronically Signed   By: David  Martinique   On: 02/03/2014 14:51   Ct Angio Abd/pel W/ And/or W/o  01/28/2014   CLINICAL DATA:  In the extreme abdominal pain and abdominal distention. Nausea. Generalized abdominal pain.  EXAM: CTA ABDOMEN AND PELVIS wITHOUT AND WITH CONTRAST  TECHNIQUE: Multidetector CT imaging of the abdomen and pelvis was performed using the standard protocol during bolus administration of intravenous contrast. Multiplanar reconstructed images and MIPs were obtained and reviewed to evaluate the vascular anatomy.  CONTRAST:  141mL OMNIPAQUE IOHEXOL 350 MG/ML SOLN  COMPARISON:  Ultrasound abdomen 01/28/2014. CT abdomen and pelvis 01/21/2014.  FINDINGS: CT images obtained during the angiographic phase after contrast administration demonstrate diffuse calcification and mild mural thrombus/atherosclerotic changes in the abdominal aorta. No evidence of aneurysm or dissection. The abdominal aorta, celiac axis, superior mesenteric artery, single bilateral renal arteries, inferior  mesenteric artery, and bilateral common iliac, external iliac, internal iliac, and common femoral arteries are patent.  Mild dependent changes in the lung bases. Small pericardial effusion or thickening. Thickening of the wall of the distal esophagus may indicate reflux esophagitis.  Cholelithiasis with gas in the gallbladder and mild gallbladder wall thickening. Appearance is suspicious for gangrenous cholecystitis. No bile duct dilatation. The liver, spleen, pancreas, adrenal glands, kidneys, inferior vena cava, and retroperitoneal lymph nodes appear unremarkable. Stomach is mildly distended with a fluid. No gastric wall thickening. Small bowel are decompressed. Diffusely stool-filled colon without distention. No free air or free fluid in the abdomen.  Pelvis: Prostate gland is enlarged. Bladder wall is mildly thickened which may be due to bladder wall hypertrophy or infection. Correlate clinically for signs of cystitis. Diverticulosis of the sigmoid colon. No evidence of diverticulitis. The appendix is normal. No free or loculated pelvic fluid collections. No pelvic mass or lymphadenopathy. Degenerative changes in the lumbar spine. No destructive bone lesions appreciated.  Review of the MIP images confirms the above findings.  IMPRESSION: No evidence of abdominal aortic aneurysm or dissection. Abdominal aorta and major branch vessels are patent.  Cholelithiasis with gas in the gallbladder and mild gallbladder wall thickening worrisome for gangrenous cholecystitis.   Electronically Signed   By: Lucienne Capers  M.D.   On: 01/28/2014 04:58    Laparascopic cholecystectomy 01/30/14  Consultations:  Surgery  GI  PT/OT  Antibiotics:  Rocephin X 8 days  Flagyl 02/04/14  Discharge Exam: Filed Vitals:   02/06/14 1416  BP: 131/74  Pulse: 71  Temp: 98 F (36.7 C)  Resp: 15   Filed Vitals:   02/05/14 1317 02/05/14 2202 02/06/14 0616 02/06/14 1416  BP: 129/73 144/61 155/69 131/74  Pulse: 67 86  72 71  Temp: 98 F (36.7 C) 98.8 F (37.1 C) 98.3 F (36.8 C) 98 F (36.7 C)  TempSrc: Oral Oral  Oral  Resp: 17 17 16 15   Height:      Weight:      SpO2: 98% 97% 98% 97%     General: Alert, oriented, NAD  Cardiovascular: RR, NR, S1/S2, no murmurs  Respiratory: course rhonchi in base on right, improved on left, no wheezing  Abdomen: Distended, soft, non tender, bowel sounds hypoactive, no guarding  Extremities: No edema, warm and dry  Neuro: Grossly nonfocal  Discharge Instructions      Discharge Instructions    Ambulatory referral to Nutrition and Diabetic Education    Complete by:  As directed   A1C=8.8%.  Likely needs 1:1     Diet Carb Modified    Complete by:  As directed      Increase activity slowly    Complete by:  As directed             Medication List    STOP taking these medications        hydrochlorothiazide 12.5 MG tablet  Commonly known as:  HYDRODIURIL      TAKE these medications        ALPRAZolam 0.25 MG tablet  Commonly known as:  XANAX  Take 0.25 mg by mouth 2 (two) times daily as needed for anxiety.     amLODipine 10 MG tablet  Commonly known as:  NORVASC  Take 10 mg by mouth daily.     aspirin 81 MG tablet  Take 81 mg by mouth daily.     CALCIUM 600 + D PO  Take 1 tablet by mouth daily.     carboxymethylcellulose 0.5 % Soln  Commonly known as:  REFRESH PLUS  Place 1 drop into both eyes 3 (three) times daily as needed (dry eyes).     DEXILANT 60 MG capsule  Generic drug:  dexlansoprazole  Take 60 mg by mouth daily.     fluorometholone 0.1 % ophthalmic suspension  Commonly known as:  FML  Place 1 drop into the left eye 4 (four) times daily.     HYDROcodone-acetaminophen 5-325 MG per tablet  Commonly known as:  NORCO/VICODIN  Take 1 tablet by mouth every 6 (six) hours as needed for severe pain.     ibuprofen 200 MG tablet  Commonly known as:  ADVIL,MOTRIN  Take 200 mg by mouth every 6 (six) hours as needed for  moderate pain.     meclizine 25 MG tablet  Commonly known as:  ANTIVERT  Take 25 mg by mouth 3 (three) times daily as needed for dizziness.     metFORMIN 500 MG tablet  Commonly known as:  GLUCOPHAGE  Take 1 tablet (500 mg total) by mouth daily with breakfast.     metoprolol 50 MG tablet  Commonly known as:  LOPRESSOR  Take 50 mg by mouth daily.     metroNIDAZOLE 500 MG tablet  Commonly known as:  FLAGYL  Take 1 tablet (500 mg total) by mouth every 8 (eight) hours.     MILK OF MAGNESIA 400 MG/5ML suspension  Generic drug:  magnesium hydroxide  Take 30 mLs by mouth daily as needed for mild constipation.     PATADAY 0.2 % Soln  Generic drug:  Olopatadine HCl  Place 1 drop into both eyes daily.     potassium chloride 10 MEQ tablet  Commonly known as:  K-DUR,KLOR-CON  Take 10 mEq by mouth daily.     promethazine 25 MG tablet  Commonly known as:  PHENERGAN  Take 25 mg by mouth every 6 (six) hours as needed for nausea.     rosuvastatin 20 MG tablet  Commonly known as:  CRESTOR  Take 20 mg by mouth daily.     senna-docusate 8.6-50 MG per tablet  Commonly known as:  Senokot-S  Take 1 tablet by mouth at bedtime. For constipation     tamsulosin 0.4 MG Caps capsule  Commonly known as:  FLOMAX  Take 0.4 mg by mouth daily after supper.     tiZANidine 4 MG tablet  Commonly known as:  ZANAFLEX  Take 4 mg by mouth every 6 (six) hours as needed for muscle spasms.     traMADol 50 MG tablet  Commonly known as:  ULTRAM  Take 1 tablet (50 mg total) by mouth every 12 (twelve) hours as needed for moderate pain.       Follow-up Information    Follow up with [facility] On 02/28/2014.   Why:  For post-operation check. Your appointment is at 1:45pm, please arrive at least 30 min before your appointment to complete your check in paperwork.  If you are unable to arrive 30 min prior to your appointment time we may have to cancel or reschedule you   Contact information:    [address]   [place] [address] [phone]       Follow up with Peter Arabian, MD. Schedule an appointment as soon as possible for a visit in 1 week.   Why:  for hospital follow-up   Contact information:   [place] [address] [phone]        The results of significant diagnostics from this hospitalization (including imaging, microbiology, ancillary and laboratory) are listed below for reference.     Microbiology: Recent Results (from the past 240 hour(s))  Clostridium Difficile by PCR     Status: None   Collection Time: 02/04/14  8:00 AM  Result Value Ref Range Status   C difficile by pcr NEGATIVE NEGATIVE Final     Labs: Basic Metabolic Panel:  Recent Labs Lab 02/03/14 1510 02/04/14 0936 02/05/14 0500 02/06/14 0300 02/06/14 1400  NA 137 136* 141 140 140  K 3.9 3.6* 3.2* 3.3* 4.5  CL 101 100 102 102 103  CO2 23 24 27 24 25   GLUCOSE 125* 171* 102* 100* 135*  BUN 18 14 15 16 14   CREATININE 1.11 1.13 1.16 1.01 1.09  CALCIUM 8.2* 8.4 8.1* 8.0* 8.9   Liver Function Tests:  Recent Labs Lab 02/03/14 1510  AST 19  ALT 15  ALKPHOS 73  BILITOT 0.3  PROT 5.9*  ALBUMIN 2.1*  CBC:  Recent Labs Lab 02/03/14 0305 02/05/14 0500 02/06/14 1400  WBC 8.2 6.7  --   HGB 11.1* 9.7* 10.6*  HCT 33.5* 30.7* 33.8*  MCV 81.3 83.0  --   PLT 248 299  --  CBG:  Recent Labs Lab 02/05/14 1705 02/05/14 2146 02/05/14 2200 02/06/14 0734 02/06/14 1154  GLUCAP 90 116* 138* 103* 147*     SIGNED: Time coordinating discharge: 50 minutes  Auryn Paige, MD  [facility] 02/09/2014, 9:23 AM Pager [phone]  If 7PM-7AM, please contact night-coverage [email or web address] Password [email or web address]

## 2014-03-16 DIAGNOSIS — E1040 Type 1 diabetes mellitus with diabetic neuropathy, unspecified: Secondary | ICD-10-CM | POA: Diagnosis not present

## 2014-03-16 DIAGNOSIS — E1140 Type 2 diabetes mellitus with diabetic neuropathy, unspecified: Secondary | ICD-10-CM | POA: Diagnosis not present

## 2014-03-16 DIAGNOSIS — I10 Essential (primary) hypertension: Secondary | ICD-10-CM | POA: Diagnosis not present

## 2014-03-16 DIAGNOSIS — K219 Gastro-esophageal reflux disease without esophagitis: Secondary | ICD-10-CM | POA: Diagnosis not present

## 2014-03-20 DIAGNOSIS — E11321 Type 2 diabetes mellitus with mild nonproliferative diabetic retinopathy with macular edema: Secondary | ICD-10-CM | POA: Diagnosis not present

## 2014-03-20 DIAGNOSIS — H43813 Vitreous degeneration, bilateral: Secondary | ICD-10-CM | POA: Diagnosis not present

## 2014-03-31 ENCOUNTER — Other Ambulatory Visit: Payer: Self-pay | Admitting: Radiation Oncology

## 2014-04-03 DIAGNOSIS — H35052 Retinal neovascularization, unspecified, left eye: Secondary | ICD-10-CM | POA: Diagnosis not present

## 2014-04-03 DIAGNOSIS — H3532 Exudative age-related macular degeneration: Secondary | ICD-10-CM | POA: Diagnosis not present

## 2014-04-13 DIAGNOSIS — I10 Essential (primary) hypertension: Secondary | ICD-10-CM | POA: Diagnosis not present

## 2014-04-13 DIAGNOSIS — K219 Gastro-esophageal reflux disease without esophagitis: Secondary | ICD-10-CM | POA: Diagnosis not present

## 2014-04-13 DIAGNOSIS — E784 Other hyperlipidemia: Secondary | ICD-10-CM | POA: Diagnosis not present

## 2014-04-13 DIAGNOSIS — E1140 Type 2 diabetes mellitus with diabetic neuropathy, unspecified: Secondary | ICD-10-CM | POA: Diagnosis not present

## 2014-04-27 DIAGNOSIS — E1151 Type 2 diabetes mellitus with diabetic peripheral angiopathy without gangrene: Secondary | ICD-10-CM | POA: Diagnosis not present

## 2014-04-27 DIAGNOSIS — L602 Onychogryphosis: Secondary | ICD-10-CM | POA: Diagnosis not present

## 2014-04-27 DIAGNOSIS — L84 Corns and callosities: Secondary | ICD-10-CM | POA: Diagnosis not present

## 2014-05-08 DIAGNOSIS — H3532 Exudative age-related macular degeneration: Secondary | ICD-10-CM | POA: Diagnosis not present

## 2014-05-08 DIAGNOSIS — H35052 Retinal neovascularization, unspecified, left eye: Secondary | ICD-10-CM | POA: Diagnosis not present

## 2014-06-12 DIAGNOSIS — H3532 Exudative age-related macular degeneration: Secondary | ICD-10-CM | POA: Diagnosis not present

## 2014-06-12 DIAGNOSIS — H35052 Retinal neovascularization, unspecified, left eye: Secondary | ICD-10-CM | POA: Diagnosis not present

## 2014-06-15 DIAGNOSIS — E1140 Type 2 diabetes mellitus with diabetic neuropathy, unspecified: Secondary | ICD-10-CM | POA: Diagnosis not present

## 2014-06-15 DIAGNOSIS — K219 Gastro-esophageal reflux disease without esophagitis: Secondary | ICD-10-CM | POA: Diagnosis not present

## 2014-06-15 DIAGNOSIS — E784 Other hyperlipidemia: Secondary | ICD-10-CM | POA: Diagnosis not present

## 2014-06-15 DIAGNOSIS — I10 Essential (primary) hypertension: Secondary | ICD-10-CM | POA: Diagnosis not present

## 2014-06-28 DIAGNOSIS — E1151 Type 2 diabetes mellitus with diabetic peripheral angiopathy without gangrene: Secondary | ICD-10-CM | POA: Diagnosis not present

## 2014-06-28 DIAGNOSIS — L84 Corns and callosities: Secondary | ICD-10-CM | POA: Diagnosis not present

## 2014-06-28 DIAGNOSIS — L602 Onychogryphosis: Secondary | ICD-10-CM | POA: Diagnosis not present

## 2014-07-03 DIAGNOSIS — E119 Type 2 diabetes mellitus without complications: Secondary | ICD-10-CM | POA: Diagnosis not present

## 2014-07-03 DIAGNOSIS — H35722 Serous detachment of retinal pigment epithelium, left eye: Secondary | ICD-10-CM | POA: Diagnosis not present

## 2014-07-03 DIAGNOSIS — H43822 Vitreomacular adhesion, left eye: Secondary | ICD-10-CM | POA: Diagnosis not present

## 2014-07-12 DIAGNOSIS — H5462 Unqualified visual loss, left eye, normal vision right eye: Secondary | ICD-10-CM | POA: Diagnosis not present

## 2014-07-12 DIAGNOSIS — H43822 Vitreomacular adhesion, left eye: Secondary | ICD-10-CM | POA: Diagnosis not present

## 2014-07-12 DIAGNOSIS — H3322 Serous retinal detachment, left eye: Secondary | ICD-10-CM | POA: Diagnosis not present

## 2014-07-19 DIAGNOSIS — H43822 Vitreomacular adhesion, left eye: Secondary | ICD-10-CM | POA: Diagnosis not present

## 2014-07-21 DIAGNOSIS — K921 Melena: Secondary | ICD-10-CM | POA: Diagnosis not present

## 2014-07-29 ENCOUNTER — Other Ambulatory Visit: Payer: Self-pay | Admitting: Radiation Oncology

## 2014-08-01 DIAGNOSIS — Z6831 Body mass index (BMI) 31.0-31.9, adult: Secondary | ICD-10-CM | POA: Diagnosis not present

## 2014-08-01 DIAGNOSIS — M47812 Spondylosis without myelopathy or radiculopathy, cervical region: Secondary | ICD-10-CM | POA: Diagnosis not present

## 2014-08-02 DIAGNOSIS — H3532 Exudative age-related macular degeneration: Secondary | ICD-10-CM | POA: Diagnosis not present

## 2014-08-16 DIAGNOSIS — K5730 Diverticulosis of large intestine without perforation or abscess without bleeding: Secondary | ICD-10-CM | POA: Diagnosis not present

## 2014-08-16 DIAGNOSIS — K921 Melena: Secondary | ICD-10-CM | POA: Diagnosis not present

## 2014-08-16 DIAGNOSIS — K641 Second degree hemorrhoids: Secondary | ICD-10-CM | POA: Diagnosis not present

## 2014-09-12 DIAGNOSIS — H3532 Exudative age-related macular degeneration: Secondary | ICD-10-CM | POA: Diagnosis not present

## 2014-09-13 DIAGNOSIS — R6889 Other general symptoms and signs: Secondary | ICD-10-CM | POA: Diagnosis not present

## 2014-09-14 DIAGNOSIS — E1140 Type 2 diabetes mellitus with diabetic neuropathy, unspecified: Secondary | ICD-10-CM | POA: Diagnosis not present

## 2014-09-14 DIAGNOSIS — I10 Essential (primary) hypertension: Secondary | ICD-10-CM | POA: Diagnosis not present

## 2014-09-14 DIAGNOSIS — H8113 Benign paroxysmal vertigo, bilateral: Secondary | ICD-10-CM | POA: Diagnosis not present

## 2014-09-14 DIAGNOSIS — E784 Other hyperlipidemia: Secondary | ICD-10-CM | POA: Diagnosis not present

## 2014-09-27 DIAGNOSIS — E1151 Type 2 diabetes mellitus with diabetic peripheral angiopathy without gangrene: Secondary | ICD-10-CM | POA: Diagnosis not present

## 2014-09-27 DIAGNOSIS — L602 Onychogryphosis: Secondary | ICD-10-CM | POA: Diagnosis not present

## 2014-10-16 DIAGNOSIS — H3532 Exudative age-related macular degeneration: Secondary | ICD-10-CM | POA: Diagnosis not present

## 2014-10-26 DIAGNOSIS — E784 Other hyperlipidemia: Secondary | ICD-10-CM | POA: Diagnosis not present

## 2014-10-26 DIAGNOSIS — M5080 Other cervical disc disorders, unspecified cervical region: Secondary | ICD-10-CM | POA: Diagnosis not present

## 2014-10-26 DIAGNOSIS — I10 Essential (primary) hypertension: Secondary | ICD-10-CM | POA: Diagnosis not present

## 2014-10-26 DIAGNOSIS — E1140 Type 2 diabetes mellitus with diabetic neuropathy, unspecified: Secondary | ICD-10-CM | POA: Diagnosis not present

## 2014-11-20 DIAGNOSIS — H3532 Exudative age-related macular degeneration: Secondary | ICD-10-CM | POA: Diagnosis not present

## 2014-11-23 DIAGNOSIS — C61 Malignant neoplasm of prostate: Secondary | ICD-10-CM | POA: Diagnosis not present

## 2014-11-30 DIAGNOSIS — E1140 Type 2 diabetes mellitus with diabetic neuropathy, unspecified: Secondary | ICD-10-CM | POA: Diagnosis not present

## 2014-11-30 DIAGNOSIS — H8113 Benign paroxysmal vertigo, bilateral: Secondary | ICD-10-CM | POA: Diagnosis not present

## 2014-11-30 DIAGNOSIS — K219 Gastro-esophageal reflux disease without esophagitis: Secondary | ICD-10-CM | POA: Diagnosis not present

## 2014-11-30 DIAGNOSIS — I10 Essential (primary) hypertension: Secondary | ICD-10-CM | POA: Diagnosis not present

## 2014-12-19 DIAGNOSIS — H43813 Vitreous degeneration, bilateral: Secondary | ICD-10-CM | POA: Diagnosis not present

## 2014-12-19 DIAGNOSIS — H2513 Age-related nuclear cataract, bilateral: Secondary | ICD-10-CM | POA: Diagnosis not present

## 2014-12-19 DIAGNOSIS — H16223 Keratoconjunctivitis sicca, not specified as Sjogren's, bilateral: Secondary | ICD-10-CM | POA: Diagnosis not present

## 2014-12-25 DIAGNOSIS — E119 Type 2 diabetes mellitus without complications: Secondary | ICD-10-CM | POA: Diagnosis not present

## 2014-12-25 DIAGNOSIS — H353111 Nonexudative age-related macular degeneration, right eye, early dry stage: Secondary | ICD-10-CM | POA: Diagnosis not present

## 2014-12-25 DIAGNOSIS — H353221 Exudative age-related macular degeneration, left eye, with active choroidal neovascularization: Secondary | ICD-10-CM | POA: Diagnosis not present

## 2014-12-25 DIAGNOSIS — H35722 Serous detachment of retinal pigment epithelium, left eye: Secondary | ICD-10-CM | POA: Diagnosis not present

## 2014-12-28 DIAGNOSIS — L602 Onychogryphosis: Secondary | ICD-10-CM | POA: Diagnosis not present

## 2014-12-28 DIAGNOSIS — E119 Type 2 diabetes mellitus without complications: Secondary | ICD-10-CM | POA: Diagnosis not present

## 2015-01-01 DIAGNOSIS — H1132 Conjunctival hemorrhage, left eye: Secondary | ICD-10-CM | POA: Diagnosis not present

## 2015-01-11 DIAGNOSIS — E784 Other hyperlipidemia: Secondary | ICD-10-CM | POA: Diagnosis not present

## 2015-01-11 DIAGNOSIS — I10 Essential (primary) hypertension: Secondary | ICD-10-CM | POA: Diagnosis not present

## 2015-01-11 DIAGNOSIS — E1140 Type 2 diabetes mellitus with diabetic neuropathy, unspecified: Secondary | ICD-10-CM | POA: Diagnosis not present

## 2015-01-11 DIAGNOSIS — K219 Gastro-esophageal reflux disease without esophagitis: Secondary | ICD-10-CM | POA: Diagnosis not present

## 2015-01-24 DIAGNOSIS — H353221 Exudative age-related macular degeneration, left eye, with active choroidal neovascularization: Secondary | ICD-10-CM | POA: Diagnosis not present

## 2015-02-08 DIAGNOSIS — E1040 Type 1 diabetes mellitus with diabetic neuropathy, unspecified: Secondary | ICD-10-CM | POA: Diagnosis not present

## 2015-02-08 DIAGNOSIS — E784 Other hyperlipidemia: Secondary | ICD-10-CM | POA: Diagnosis not present

## 2015-02-08 DIAGNOSIS — E1140 Type 2 diabetes mellitus with diabetic neuropathy, unspecified: Secondary | ICD-10-CM | POA: Diagnosis not present

## 2015-02-08 DIAGNOSIS — I10 Essential (primary) hypertension: Secondary | ICD-10-CM | POA: Diagnosis not present

## 2015-02-08 DIAGNOSIS — K219 Gastro-esophageal reflux disease without esophagitis: Secondary | ICD-10-CM | POA: Diagnosis not present

## 2015-02-22 DIAGNOSIS — H353221 Exudative age-related macular degeneration, left eye, with active choroidal neovascularization: Secondary | ICD-10-CM | POA: Diagnosis not present

## 2015-05-06 ENCOUNTER — Emergency Department (HOSPITAL_COMMUNITY)
Admission: EM | Admit: 2015-05-06 | Discharge: 2015-05-06 | Disposition: A | Discharge status: Home / Self Care | Payer: Medicare Other | Attending: Emergency Medicine | Admitting: Emergency Medicine

## 2015-05-06 ENCOUNTER — Emergency Department (HOSPITAL_COMMUNITY): Payer: Medicare Other

## 2015-05-06 ENCOUNTER — Encounter (HOSPITAL_COMMUNITY): Payer: Self-pay

## 2015-05-06 DIAGNOSIS — R109 Unspecified abdominal pain: Secondary | ICD-10-CM | POA: Insufficient documentation

## 2015-05-06 DIAGNOSIS — Z8546 Personal history of malignant neoplasm of prostate: Secondary | ICD-10-CM | POA: Insufficient documentation

## 2015-05-06 DIAGNOSIS — E785 Hyperlipidemia, unspecified: Secondary | ICD-10-CM | POA: Diagnosis not present

## 2015-05-06 DIAGNOSIS — I10 Essential (primary) hypertension: Secondary | ICD-10-CM | POA: Diagnosis not present

## 2015-05-06 DIAGNOSIS — Z7982 Long term (current) use of aspirin: Secondary | ICD-10-CM | POA: Diagnosis not present

## 2015-05-06 DIAGNOSIS — Z79899 Other long term (current) drug therapy: Secondary | ICD-10-CM | POA: Diagnosis not present

## 2015-05-06 DIAGNOSIS — F17200 Nicotine dependence, unspecified, uncomplicated: Secondary | ICD-10-CM | POA: Insufficient documentation

## 2015-05-06 DIAGNOSIS — M549 Dorsalgia, unspecified: Secondary | ICD-10-CM | POA: Diagnosis not present

## 2015-05-06 LAB — URINALYSIS, ROUTINE W REFLEX MICROSCOPIC
Bilirubin Urine: NEGATIVE
Glucose, UA: NEGATIVE mg/dL
Hgb urine dipstick: NEGATIVE
Ketones, ur: NEGATIVE mg/dL
Leukocytes, UA: NEGATIVE
Nitrite: NEGATIVE
Protein, ur: NEGATIVE mg/dL
Specific Gravity, Urine: 1.01 (ref 1.005–1.030)
pH: 7.5 (ref 5.0–8.0)

## 2015-05-06 MED ORDER — CYCLOBENZAPRINE HCL 5 MG PO TABS: 5.0000 mg | ORAL_TABLET | Freq: Three times a day (TID) | ORAL | Status: DC | PRN

## 2015-05-06 MED ORDER — HYDROCODONE-ACETAMINOPHEN 5-325 MG PO TABS
2.0000 | ORAL_TABLET | Freq: Once | ORAL | Status: AC
  Administered 2015-05-06: 2 via ORAL
  Filled 2015-05-06: qty 2

## 2015-05-06 MED ORDER — HYDROCODONE-ACETAMINOPHEN 5-325 MG PO TABS: 1.0000 | ORAL_TABLET | ORAL | Status: DC | PRN

## 2015-05-06 NOTE — Discharge Instructions (Signed)
If you were given medicines take as directed.  If you are on coumadin or contraceptives realize their levels and effectiveness is altered by many different medicines.  If you have any reaction (rash, tongues swelling, other) to the medicines stop taking and see a physician.   Take Tylenol every 4 hours as needed for pain, for muscle spasm can try Flexeril however be careful as it can make you sleepy. If your blood pressure was elevated in the ER make sure you follow up for management with a primary doctor or return for chest pain, shortness of breath or stroke symptoms.  Please follow up as directed and return to the ER or see a physician for new or worsening symptoms.  Thank you. Filed Vitals:   05/06/15 0809  BP: 162/81  Pulse: 60  Temp: 97.9 F (36.6 C)  TempSrc: Oral  Resp: 20  SpO2: 99%

## 2015-05-06 NOTE — ED Notes (Signed)
Pt reports hx chronic lower back pain x "several years." Pt reports worsening over past 6 months, but "started going all the way around" the last three days. Pt reports pain worse with movement and ambulation. Able to ambulate but worsens pain. Denies any GI/GU symptoms. Pt has PO meds at home for back pain with moderate relief.

## 2015-05-06 NOTE — ED Provider Notes (Signed)
CSN: [id number]     Arrival date & time 05/06/15  [id number] History   First MD Initiated Contact with Patient 05/06/15 [phone]     Chief Complaint  Patient presents with  . Back Pain     (Consider location/radiation/quality/duration/timing/severity/associated sxs/prior Treatment) HPI Comments: 75 year old male with history of high blood pressure, gallbladder removal presents with worsening right flank pain. Patient has had intermittent pain for months however last 3 days became more severe and wrapped around right flank to the back. No history of kidney stones. No injuries. Pain is worse with movement. No neurologic symptoms. Urinating okay. Patient has prostate cancer unsure if it has spread to the bone.  Patient is a 75 y.o. male presenting with back pain. The history is provided by the patient.  Back Pain Associated symptoms: no abdominal pain, no chest pain, no dysuria, no fever, no headaches, no numbness and no weakness     Past Medical History  Diagnosis Date  . Dizziness   . Hypertension   . Hyperlipidemia   . History of tobacco abuse   . Chest pain   . Cancer ([place])     prostrate ca, radiation treatments   Past Surgical History  Procedure Laterality Date  . Cholecystectomy N/A 01/30/2014    Procedure: LAPAROSCOPIC CHOLECYSTECTOMY;  Surgeon: Ralene Ok, MD;  Location: [facility] OR;  Service: General;  Laterality: N/A;   Family History  Problem Relation Age of Onset  . Cancer Brother   . Cancer Sister    Social History  Substance Use Topics  . Smoking status: Current Some Day Smoker    Last Attempt to Quit: 02/01/2010  . Smokeless tobacco: None  . Alcohol Use: No     Comment: quit 22 years ago     Review of Systems  Constitutional: Negative for fever and chills.  HENT: Negative for congestion.   Eyes: Negative for visual disturbance.  Respiratory: Negative for shortness of breath.   Cardiovascular: Negative for chest pain.  Gastrointestinal: Negative for vomiting and  abdominal pain.  Genitourinary: Negative for dysuria and flank pain.  Musculoskeletal: Positive for back pain. Negative for neck pain and neck stiffness.  Skin: Negative for rash.  Neurological: Negative for weakness, light-headedness, numbness and headaches.      Allergies  Review of patient's allergies indicates no known allergies.  Home Medications   Prior to Admission medications   Medication Sig Start Date End Date Taking? Authorizing Provider  ALPRAZolam (XANAX) 0.25 MG tablet Take 0.25 mg by mouth 2 (two) times daily as needed for anxiety.  12/20/12   Historical Provider, MD  amLODipine (NORVASC) 10 MG tablet Take 10 mg by mouth daily.    Historical Provider, MD  aspirin 81 MG tablet Take 81 mg by mouth daily.    Historical Provider, MD  Calcium Carb-Cholecalciferol (CALCIUM 600 + D PO) Take 1 tablet by mouth daily.    Historical Provider, MD  carboxymethylcellulose (REFRESH PLUS) 0.5 % SOLN Place 1 drop into both eyes 3 (three) times daily as needed (dry eyes).     Historical Provider, MD  cyclobenzaprine (FLEXERIL) 5 MG tablet Take 1 tablet (5 mg total) by mouth 3 (three) times daily as needed for muscle spasms. 05/06/15   Elnora Morrison, MD  dexlansoprazole (DEXILANT) 60 MG capsule Take 60 mg by mouth daily.    Historical Provider, MD  fluorometholone (FML) 0.1 % ophthalmic suspension Place 1 drop into the left eye 4 (four) times daily.    Historical Provider, MD  HYDROcodone-acetaminophen (  NORCO/VICODIN) 5-325 MG per tablet Take 1 tablet by mouth every 6 (six) hours as needed for severe pain. 01/31/14   Ripudeep Krystal Eaton, MD  ibuprofen (ADVIL,MOTRIN) 200 MG tablet Take 200 mg by mouth every 6 (six) hours as needed for moderate pain.     Historical Provider, MD  magnesium hydroxide (MILK OF MAGNESIA) 400 MG/5ML suspension Take 30 mLs by mouth daily as needed for mild constipation.    Historical Provider, MD  meclizine (ANTIVERT) 25 MG tablet Take 25 mg by mouth 3 (three) times daily  as needed for dizziness.    Historical Provider, MD  metFORMIN (GLUCOPHAGE) 500 MG tablet Take 1 tablet (500 mg total) by mouth daily with breakfast. 02/06/14   Sid Falcon, MD  metoprolol (LOPRESSOR) 50 MG tablet Take 50 mg by mouth daily.  12/11/12   Historical Provider, MD  metroNIDAZOLE (FLAGYL) 500 MG tablet Take 1 tablet (500 mg total) by mouth every 8 (eight) hours. 02/06/14   Sid Falcon, MD  Olopatadine HCl (PATADAY) 0.2 % SOLN Place 1 drop into both eyes daily.    Historical Provider, MD  potassium chloride (K-DUR,KLOR-CON) 10 MEQ tablet Take 10 mEq by mouth daily.    Historical Provider, MD  promethazine (PHENERGAN) 25 MG tablet Take 25 mg by mouth every 6 (six) hours as needed for nausea.     Historical Provider, MD  rosuvastatin (CRESTOR) 20 MG tablet Take 20 mg by mouth daily.    Historical Provider, MD  senna-docusate (SENOKOT-S) 8.6-50 MG per tablet Take 1 tablet by mouth at bedtime. For constipation 01/31/14   Ripudeep Krystal Eaton, MD  tamsulosin (FLOMAX) 0.4 MG CAPS capsule Take 0.4 mg by mouth daily after supper.    Historical Provider, MD  tamsulosin (FLOMAX) 0.4 MG CAPS capsule TAKE 1 CAPSULE AT BEDTIME 07/30/14   Tyler Pita, MD  tiZANidine (ZANAFLEX) 4 MG tablet Take 4 mg by mouth every 6 (six) hours as needed for muscle spasms.    Historical Provider, MD  traMADol (ULTRAM) 50 MG tablet Take 1 tablet (50 mg total) by mouth every 12 (twelve) hours as needed for moderate pain. 01/31/14   Ripudeep K Rai, MD   BP 162/81 mmHg  Pulse 60  Temp(Src) 97.9 F (36.6 C) (Oral)  Resp 20  SpO2 99% Physical Exam  Constitutional: He is oriented to person, place, and time. He appears well-developed and well-nourished.  HENT:  Head: Normocephalic and atraumatic.  Eyes: Conjunctivae are normal. Right eye exhibits no discharge. Left eye exhibits no discharge.  Neck: Normal range of motion. Neck supple. No tracheal deviation present.  Cardiovascular: Normal rate and regular rhythm.    Pulmonary/Chest: Effort normal and breath sounds normal.  Abdominal: Soft. He exhibits no distension. There is no tenderness. There is no guarding.  Musculoskeletal: He exhibits tenderness. He exhibits no edema.  Tender right mid and lower flank worse with movement.  Neurological: He is alert and oriented to person, place, and time.  Reflex Scores:      Patellar reflexes are 2+ on the right side and 2+ on the left side.      Achilles reflexes are 2+ on the right side and 2+ on the left side. Patient has 5+ strength lower extremities with flexion extension of hips knees and great toes. Sensation intact to major nerves.  Skin: Skin is warm. No rash noted.  Psychiatric: He has a normal mood and affect.  Nursing note and vitals reviewed.   ED Course  Procedures (including  critical care time) Labs Review Labs Reviewed  URINALYSIS, ROUTINE W REFLEX MICROSCOPIC (NOT AT [facility])    Imaging Review Ct Renal Stone Study  05/06/2015  CLINICAL DATA:  75 year old male chronic right flank and abdominal pain. EXAM: CT ABDOMEN AND PELVIS WITHOUT CONTRAST TECHNIQUE: Multidetector CT imaging of the abdomen and pelvis was performed following the standard protocol without IV contrast. COMPARISON:  02/03/2014 prior CTs FINDINGS: Please note that parenchymal abnormalities may be missed without intravenous contrast. Lower chest:  No acute abnormalities. Hepatobiliary: Visualized liver is unremarkable. Patient is status post cholecystectomy. There is no evidence of biliary dilatation. Pancreas: Unremarkable Spleen: Unremarkable Adrenals/Urinary Tract: The kidneys, adrenal glands and bladder are unremarkable. Stomach/Bowel: There is no evidence of bowel obstruction or definite bowel wall thickening. Colonic diverticulosis noted without diverticulitis. The appendix is normal. Vascular/Lymphatic: Aortic atherosclerotic calcifications noted without aneurysm. No enlarged lymph nodes identified. Reproductive: Prostate  enlargement present. Other: No free fluid, focal collection or pneumoperitoneum. Musculoskeletal: No acute or suspicious abnormalities. A moderate to large diffuse broad-based disc bulge at L4-5 contributes to mild to moderate central spinal and foraminal narrowing. Moderate degenerative disc disease, spondylosis and diffuse disc old age contributes to foraminal and lateral recess narrowing. Mild to moderate facet arthropathy within the mid and lower lumbar spine identified. IMPRESSION: No evidence of acute abnormality. Lumbar spine degenerative changes as described. Aortic atherosclerosis without aneurysm. Electronically Signed   By: Margarette Canada M.D.   On: 05/06/2015 10:55   I have personally reviewed and evaluated these images and lab results as part of my medical decision-making.   EKG Interpretation None      MDM   Final diagnoses:  Right flank pain   Patient presents with concern for [place] skeletal back pain however with age and worsening symptoms plan for screening CT stone study to look for other causes. Patient is no fevers or signs of infection. No obvious AAA on exam, 2+ pulses distally and normal neurologic exam. CT scan reviewed results no acute findings. Pain improved in the ER. Patient stable for outpatient follow-up Results and differential diagnosis were discussed with the patient/parent/guardian. Xrays were independently reviewed by myself.  Close follow up outpatient was discussed, comfortable with the plan.   Medications  HYDROcodone-acetaminophen (NORCO/VICODIN) 5-325 MG per tablet 2 tablet (2 tablets Oral Given 05/06/15 0936)    Filed Vitals:   05/06/15 0809  BP: 162/81  Pulse: 60  Temp: 97.9 F (36.6 C)  TempSrc: Oral  Resp: 20  SpO2: 99%    Final diagnoses:  Right flank pain      Elnora Morrison, MD 05/06/15 1102

## 2015-05-06 NOTE — ED Notes (Signed)
Pt has to wait 90 minutes to drive after taking Norco, pt states unable to tolerate waiting in chair.  Per charge RN okay to place pt in hallway on stretcher.

## 2015-06-06 ENCOUNTER — Other Ambulatory Visit: Payer: Self-pay | Admitting: Radiation Oncology

## 2015-08-23 ENCOUNTER — Encounter (HOSPITAL_COMMUNITY): Payer: Self-pay | Admitting: Emergency Medicine

## 2015-08-23 ENCOUNTER — Emergency Department (HOSPITAL_COMMUNITY): Payer: Medicare Other

## 2015-08-23 ENCOUNTER — Inpatient Hospital Stay (HOSPITAL_COMMUNITY)
Admission: EM | Admit: 2015-08-23 | Discharge: 2015-08-27 | DRG: 066 | Disposition: A | Discharge status: Home Health | Payer: Medicare Other | Attending: Internal Medicine | Admitting: Internal Medicine

## 2015-08-23 DIAGNOSIS — Z6832 Body mass index (BMI) 32.0-32.9, adult: Secondary | ICD-10-CM

## 2015-08-23 DIAGNOSIS — Z7982 Long term (current) use of aspirin: Secondary | ICD-10-CM | POA: Diagnosis not present

## 2015-08-23 DIAGNOSIS — IMO0002 Reserved for concepts with insufficient information to code with codable children: Secondary | ICD-10-CM

## 2015-08-23 DIAGNOSIS — I639 Cerebral infarction, unspecified: Secondary | ICD-10-CM

## 2015-08-23 DIAGNOSIS — I6320 Cerebral infarction due to unspecified occlusion or stenosis of unspecified precerebral arteries: Secondary | ICD-10-CM

## 2015-08-23 DIAGNOSIS — G510 Bell's palsy: Secondary | ICD-10-CM

## 2015-08-23 DIAGNOSIS — F17200 Nicotine dependence, unspecified, uncomplicated: Secondary | ICD-10-CM | POA: Diagnosis present

## 2015-08-23 DIAGNOSIS — R7989 Other specified abnormal findings of blood chemistry: Secondary | ICD-10-CM

## 2015-08-23 DIAGNOSIS — G459 Transient cerebral ischemic attack, unspecified: Secondary | ICD-10-CM | POA: Diagnosis present

## 2015-08-23 DIAGNOSIS — D72819 Decreased white blood cell count, unspecified: Secondary | ICD-10-CM | POA: Diagnosis present

## 2015-08-23 DIAGNOSIS — E669 Obesity, unspecified: Secondary | ICD-10-CM | POA: Diagnosis present

## 2015-08-23 DIAGNOSIS — K5900 Constipation, unspecified: Secondary | ICD-10-CM | POA: Diagnosis present

## 2015-08-23 DIAGNOSIS — I63031 Cerebral infarction due to thrombosis of right carotid artery: Secondary | ICD-10-CM

## 2015-08-23 DIAGNOSIS — Z8546 Personal history of malignant neoplasm of prostate: Secondary | ICD-10-CM | POA: Diagnosis not present

## 2015-08-23 DIAGNOSIS — Z823 Family history of stroke: Secondary | ICD-10-CM

## 2015-08-23 DIAGNOSIS — I10 Essential (primary) hypertension: Secondary | ICD-10-CM | POA: Diagnosis present

## 2015-08-23 DIAGNOSIS — E119 Type 2 diabetes mellitus without complications: Secondary | ICD-10-CM | POA: Diagnosis present

## 2015-08-23 DIAGNOSIS — Z9119 Patient's noncompliance with other medical treatment and regimen: Secondary | ICD-10-CM | POA: Diagnosis not present

## 2015-08-23 DIAGNOSIS — Z8673 Personal history of transient ischemic attack (TIA), and cerebral infarction without residual deficits: Secondary | ICD-10-CM

## 2015-08-23 DIAGNOSIS — Z809 Family history of malignant neoplasm, unspecified: Secondary | ICD-10-CM | POA: Diagnosis not present

## 2015-08-23 DIAGNOSIS — Z79899 Other long term (current) drug therapy: Secondary | ICD-10-CM

## 2015-08-23 DIAGNOSIS — E785 Hyperlipidemia, unspecified: Secondary | ICD-10-CM | POA: Insufficient documentation

## 2015-08-23 DIAGNOSIS — R471 Dysarthria and anarthria: Secondary | ICD-10-CM | POA: Diagnosis present

## 2015-08-23 DIAGNOSIS — I6340 Cerebral infarction due to embolism of unspecified cerebral artery: Secondary | ICD-10-CM | POA: Diagnosis present

## 2015-08-23 DIAGNOSIS — Z923 Personal history of irradiation: Secondary | ICD-10-CM

## 2015-08-23 DIAGNOSIS — N400 Enlarged prostate without lower urinary tract symptoms: Secondary | ICD-10-CM | POA: Diagnosis present

## 2015-08-23 DIAGNOSIS — F419 Anxiety disorder, unspecified: Secondary | ICD-10-CM | POA: Diagnosis present

## 2015-08-23 HISTORY — DX: Type 2 diabetes mellitus without complications: E11.9

## 2015-08-23 LAB — COMPREHENSIVE METABOLIC PANEL
ALT: 15 U/L — ABNORMAL LOW (ref 17–63)
AST: 18 U/L (ref 15–41)
Albumin: 3.9 g/dL (ref 3.5–5.0)
Alkaline Phosphatase: 88 U/L (ref 38–126)
Anion gap: 5 (ref 5–15)
BUN: 10 mg/dL (ref 6–20)
CO2: 26 mmol/L (ref 22–32)
Calcium: 9 mg/dL (ref 8.9–10.3)
Chloride: 107 mmol/L (ref 101–111)
Creatinine, Ser: 1.39 mg/dL — ABNORMAL HIGH (ref 0.61–1.24)
GFR calc Af Amer: 55 mL/min — ABNORMAL LOW (ref >60)
GFR calc non Af Amer: 48 mL/min — ABNORMAL LOW (ref >60)
Glucose, Bld: 157 mg/dL — ABNORMAL HIGH (ref 65–99)
Potassium: 4.4 mmol/L (ref 3.5–5.1)
Sodium: 138 mmol/L (ref 135–145)
Total Bilirubin: 0.5 mg/dL (ref 0.3–1.2)
Total Protein: 6.7 g/dL (ref 6.5–8.1)

## 2015-08-23 LAB — DIFFERENTIAL
Basophils Absolute: 0 10*3/uL (ref 0.0–0.1)
Basophils Relative: 0 %
Eosinophils Absolute: 0.1 10*3/uL (ref 0.0–0.7)
Eosinophils Relative: 2 %
Lymphocytes Relative: 24 %
Lymphs Abs: 0.9 10*3/uL (ref 0.7–4.0)
Monocytes Absolute: 0.4 10*3/uL (ref 0.1–1.0)
Monocytes Relative: 10 %
Neutro Abs: 2.5 10*3/uL (ref 1.7–7.7)
Neutrophils Relative %: 64 %

## 2015-08-23 LAB — GLUCOSE, CAPILLARY: Glucose-Capillary: 198 mg/dL — ABNORMAL HIGH (ref 65–99)

## 2015-08-23 LAB — CBC
HCT: 40.6 % (ref 39.0–52.0)
HCT: 41.4 % (ref 39.0–52.0)
Hemoglobin: 12.8 g/dL — ABNORMAL LOW (ref 13.0–17.0)
Hemoglobin: 13.1 g/dL (ref 13.0–17.0)
MCH: 26.8 pg (ref 26.0–34.0)
MCH: 26.5 pg (ref 26.0–34.0)
MCHC: 31.5 g/dL (ref 30.0–36.0)
MCHC: 31.6 g/dL (ref 30.0–36.0)
MCV: 84.9 fL (ref 78.0–100.0)
MCV: 83.6 fL (ref 78.0–100.0)
Platelets: 179 10*3/uL (ref 150–400)
Platelets: 187 10*3/uL (ref 150–400)
RBC: 4.78 MIL/uL (ref 4.22–5.81)
RBC: 4.95 MIL/uL (ref 4.22–5.81)
RDW: 14.5 % (ref 11.5–15.5)
RDW: 14.5 % (ref 11.5–15.5)
WBC: 3.8 10*3/uL — ABNORMAL LOW (ref 4.0–10.5)
WBC: 3.6 10*3/uL — ABNORMAL LOW (ref 4.0–10.5)

## 2015-08-23 LAB — I-STAT TROPONIN, ED: Troponin i, poc: 0 ng/mL (ref 0.00–0.08)

## 2015-08-23 LAB — I-STAT CHEM 8, ED
BUN: 12 mg/dL (ref 6–20)
Calcium, Ion: 1.21 mmol/L (ref 1.13–1.30)
Chloride: 102 mmol/L (ref 101–111)
Creatinine, Ser: 1.4 mg/dL — ABNORMAL HIGH (ref 0.61–1.24)
Glucose, Bld: 148 mg/dL — ABNORMAL HIGH (ref 65–99)
HCT: 42 % (ref 39.0–52.0)
Hemoglobin: 14.3 g/dL (ref 13.0–17.0)
Potassium: 4.6 mmol/L (ref 3.5–5.1)
Sodium: 141 mmol/L (ref 135–145)
TCO2: 29 mmol/L (ref 0–100)

## 2015-08-23 LAB — PROTIME-INR
INR: 1.26 (ref 0.00–1.49)
Prothrombin Time: 16 seconds — ABNORMAL HIGH (ref 11.6–15.2)

## 2015-08-23 LAB — APTT: aPTT: 26 seconds (ref 24–37)

## 2015-08-23 LAB — CBG MONITORING, ED: Glucose-Capillary: 100 mg/dL — ABNORMAL HIGH (ref 65–99)

## 2015-08-23 MED ORDER — ENOXAPARIN SODIUM 40 MG/0.4ML ~~LOC~~ SOLN
40.0000 mg | SUBCUTANEOUS | Status: DC
Start: 2015-08-23 — End: 2015-08-23
  Administered 2015-08-23: 40 mg via SUBCUTANEOUS
  Filled 2015-08-23: qty 0.4

## 2015-08-23 MED ORDER — INSULIN ASPART 100 UNIT/ML ~~LOC~~ SOLN
0.0000 [IU] | Freq: Three times a day (TID) | SUBCUTANEOUS | Status: DC
  Administered 2015-08-24: 3 [IU] via SUBCUTANEOUS
  Administered 2015-08-24: 2 [IU] via SUBCUTANEOUS
  Administered 2015-08-24 – 2015-08-25 (×2): 3 [IU] via SUBCUTANEOUS
  Administered 2015-08-26 (×2): 2 [IU] via SUBCUTANEOUS
  Administered 2015-08-26: 3 [IU] via SUBCUTANEOUS

## 2015-08-23 MED ORDER — PANTOPRAZOLE SODIUM 40 MG PO TBEC
40.0000 mg | DELAYED_RELEASE_TABLET | Freq: Every day | ORAL | Status: DC
  Administered 2015-08-23 – 2015-08-27 (×5): 40 mg via ORAL
  Filled 2015-08-23 (×5): qty 1

## 2015-08-23 MED ORDER — TRAMADOL HCL 50 MG PO TABS: 50.0000 mg | ORAL_TABLET | Freq: Two times a day (BID) | ORAL | Status: DC | PRN

## 2015-08-23 MED ORDER — ALPRAZOLAM 0.25 MG PO TABS
0.2500 mg | ORAL_TABLET | Freq: Two times a day (BID) | ORAL | Status: DC | PRN
  Administered 2015-08-23 – 2015-08-24 (×2): 0.25 mg via ORAL
  Filled 2015-08-23 (×2): qty 1

## 2015-08-23 MED ORDER — TAMSULOSIN HCL 0.4 MG PO CAPS
0.4000 mg | ORAL_CAPSULE | Freq: Every day | ORAL | Status: DC
  Administered 2015-08-24 – 2015-08-27 (×4): 0.4 mg via ORAL
  Filled 2015-08-23 (×5): qty 1

## 2015-08-23 MED ORDER — ENOXAPARIN SODIUM 40 MG/0.4ML ~~LOC~~ SOLN
40.0000 mg | SUBCUTANEOUS | Status: DC
  Administered 2015-08-24 – 2015-08-26 (×3): 40 mg via SUBCUTANEOUS
  Filled 2015-08-23 (×4): qty 0.4

## 2015-08-23 MED ORDER — ROSUVASTATIN CALCIUM 20 MG PO TABS
20.0000 mg | ORAL_TABLET | Freq: Every day | ORAL | Status: DC
  Administered 2015-08-23 – 2015-08-27 (×5): 20 mg via ORAL
  Filled 2015-08-23 (×5): qty 1

## 2015-08-23 MED ORDER — ENOXAPARIN SODIUM 40 MG/0.4ML ~~LOC~~ SOLN: 40.0000 mg | SUBCUTANEOUS | Status: DC

## 2015-08-23 NOTE — Consult Note (Signed)
Requesting Physician: Dr. Audie Pinto    Chief Complaint: facial droop and dysarthria  History obtained from:  Patient    HPI:                                                                                                                                         Peter Mann is an 75 y.o. male male who was noted 3 days ago to have dysarthria and a left facial droop after going to the [facility]. He made a appointment with primary care MD for today. He went to his appointment and was sent directly to ED. CT head showed no acute pathology. There was question of possible stroke.    Date last known well: Unable to determine Time last known well: Unable to determine tPA Given: No: out of window   Past Medical History  Diagnosis Date  . Dizziness   . Hypertension   . Hyperlipidemia   . History of tobacco abuse   . Chest pain   . Cancer ([place])     prostrate ca, radiation treatments    Past Surgical History  Procedure Laterality Date  . Cholecystectomy N/A 01/30/2014    Procedure: LAPAROSCOPIC CHOLECYSTECTOMY;  Surgeon: Ralene Ok, MD;  Location: [facility] OR;  Service: General;  Laterality: N/A;    Family History  Problem Relation Age of Onset  . Cancer Brother   . Cancer Sister    Social History:  reports that he has been smoking.  He does not have any smokeless tobacco history on file. He reports that he does not drink alcohol or use illicit drugs.  Allergies: No Known Allergies  Medications:                                                                                                                           No current facility-administered medications for this encounter.   Current Outpatient Prescriptions  Medication Sig Dispense Refill  . ALPRAZolam (XANAX) 0.25 MG tablet Take 0.25 mg by mouth 2 (two) times daily as needed for anxiety.     Marland Kitchen amLODipine (NORVASC) 10 MG tablet Take 10 mg by mouth daily.    Marland Kitchen aspirin 81 MG tablet Take 81 mg by mouth daily.    . Calcium  Carb-Cholecalciferol (CALCIUM 600 + D PO) Take 1 tablet by mouth daily.    . carboxymethylcellulose (REFRESH PLUS) 0.5 %  SOLN Place 1 drop into both eyes 3 (three) times daily as needed (dry eyes).     Marland Kitchen dexlansoprazole (DEXILANT) 60 MG capsule Take 60 mg by mouth daily.    . fluorometholone (FML) 0.1 % ophthalmic suspension Place 1 drop into the left eye 4 (four) times daily.    Marland Kitchen ibuprofen (ADVIL,MOTRIN) 200 MG tablet Take 200 mg by mouth every 6 (six) hours as needed for moderate pain.     . magnesium hydroxide (MILK OF MAGNESIA) 400 MG/5ML suspension Take 30 mLs by mouth daily as needed for mild constipation.    . meclizine (ANTIVERT) 25 MG tablet Take 25 mg by mouth 3 (three) times daily as needed for dizziness.    . metoprolol (LOPRESSOR) 50 MG tablet Take 50 mg by mouth daily.     . Olopatadine HCl (PATADAY) 0.2 % SOLN Place 1 drop into both eyes daily.    . potassium chloride (K-DUR,KLOR-CON) 10 MEQ tablet Take 10 mEq by mouth daily.    . promethazine (PHENERGAN) 25 MG tablet Take 25 mg by mouth every 6 (six) hours as needed for nausea.     . rosuvastatin (CRESTOR) 20 MG tablet Take 20 mg by mouth daily.    . saxagliptin HCl (ONGLYZA) 5 MG TABS tablet Take 5 mg by mouth daily.    . tamsulosin (FLOMAX) 0.4 MG CAPS capsule Take 0.4 mg by mouth daily after supper.    Marland Kitchen tiZANidine (ZANAFLEX) 4 MG tablet Take 4 mg by mouth every 6 (six) hours as needed for muscle spasms.    . traMADol (ULTRAM) 50 MG tablet Take 1 tablet (50 mg total) by mouth every 12 (twelve) hours as needed for moderate pain. 60 tablet 0     ROS:                                                                                                                                       History obtained from the patient  General ROS: negative for - chills, fatigue, fever, night sweats, weight gain or weight loss Psychological ROS: negative for - behavioral disorder, hallucinations, memory difficulties, mood swings or suicidal  ideation Ophthalmic ROS: negative for - blurry vision, double vision, eye pain or loss of vision ENT ROS: negative for - epistaxis, nasal discharge, oral lesions, sore throat, tinnitus or vertigo Allergy and Immunology ROS: negative for - hives or itchy/watery eyes Hematological and Lymphatic ROS: negative for - bleeding problems, bruising or swollen lymph nodes Endocrine ROS: negative for - galactorrhea, hair pattern changes, polydipsia/polyuria or temperature intolerance Respiratory ROS: negative for - cough, hemoptysis, shortness of breath or wheezing Cardiovascular ROS: negative for - chest pain, dyspnea on exertion, edema or irregular heartbeat Gastrointestinal ROS: negative for - abdominal pain, diarrhea, hematemesis, nausea/vomiting or stool incontinence Genito-Urinary ROS: negative for - dysuria, hematuria, incontinence or urinary frequency/urgency Musculoskeletal ROS: negative for - joint swelling or muscular weakness Neurological  ROS: as noted in HPI Dermatological ROS: negative for rash and skin lesion changes  Neurologic Examination:                                                                                                      Blood pressure 132/74, pulse 53, temperature 97.8 F (36.6 C), temperature source Oral, resp. rate 14, SpO2 99 %.  HEENT-  Normocephalic, no lesions, without obvious abnormality.  Normal external eye and conjunctiva.  Normal TM's bilaterally.  Normal auditory canals and external ears. Normal external nose, mucus membranes and septum.  Normal pharynx. Cardiovascular- S1, S2 normal, pulses palpable throughout   Lungs- no tachypnea, retractions or cyanosis Abdomen- normal findings: bowel sounds normal Extremities- no edema Lymph-no adenopathy palpable Musculoskeletal-no joint tenderness, deformity or swelling Skin-warm and dry, no hyperpigmentation, vitiligo, or suspicious lesions  Neurological Examination Mental Status: Alert, oriented, thought  content appropriate.  Speech dysarthric without evidence of aphasia.  Able to follow 3 step commands without difficulty. Cranial Nerves: II:  Visual fields grossly normal, pupils equal, round, reactive to light and accommodation III,IV, VI: ptosis not present, extra-ocular motions intact bilaterally V,VII: smile asymmetric with weakness of left lower quadrant, facial light touch sensation normal bilaterally VIII: hearing normal bilaterally IX,X: uvula rises symmetrically XI: bilateral shoulder shrug XII: midline tongue extension Motor: Right : Upper extremity   5/5    Left:     Upper extremity   4/5  Lower extremity   5/5     Lower extremity   5/5 Tone and bulk:normal tone throughout; no atrophy noted Sensory: Pinprick and light touch intact throughout, bilaterally Deep Tendon Reflexes: 3+ and symmetric throughout Plantars: Right: downgoing   Left: downgoing Cerebellar: normal finger-to-nose and normal heel-to-shin test Gait: not tested  Lab Results: Basic Metabolic Panel:  Recent Labs Lab 08/23/15 1033 08/23/15 1055  NA 138 141  K 4.4 4.6  CL 107 102  CO2 26  --   GLUCOSE 157* 148*  BUN 10 12  CREATININE 1.39* 1.40*  CALCIUM 9.0  --     Liver Function Tests:  Recent Labs Lab 08/23/15 1033  AST 18  ALT 15*  ALKPHOS 88  BILITOT 0.5  PROT 6.7  ALBUMIN 3.9   No results for input(s): LIPASE, AMYLASE in the last 168 hours. No results for input(s): AMMONIA in the last 168 hours.  CBC:  Recent Labs Lab 08/23/15 1033 08/23/15 1055  WBC 3.8*  --   NEUTROABS 2.5  --   HGB 12.8* 14.3  HCT 40.6 42.0  MCV 84.9  --   PLT 179  --     Cardiac Enzymes: No results for input(s): CKTOTAL, CKMB, CKMBINDEX, TROPONINI in the last 168 hours.  Lipid Panel: No results for input(s): CHOL, TRIG, HDL, CHOLHDL, VLDL, LDLCALC in the last 168 hours.  CBG: No results for input(s): GLUCAP in the last 168 hours.  Microbiology: Results for orders placed or performed during the  hospital encounter of 01/28/14  Blood culture (routine x 2)     Status: None   Collection Time: 01/28/14  5:30 AM  Result Value Ref Range Status   Specimen Description BLOOD LEFT HAND  Final   Special Requests BOTTLES DRAWN AEROBIC ONLY [facility]  Final   Culture  Setup Time   Final    01/28/2014 14:53 Performed at [facility]    Culture   Final    NO GROWTH 5 DAYS Performed at [facility]    Report Status 02/03/2014 FINAL  Final  Blood culture (routine x 2)     Status: None   Collection Time: 01/28/14  5:38 AM  Result Value Ref Range Status   Specimen Description BLOOD LEFT ARM  Final   Special Requests BOTTLES DRAWN AEROBIC AND ANAEROBIC 10CC  Final   Culture  Setup Time   Final    01/28/2014 14:53 Performed at [facility]    Culture   Final    NO GROWTH 5 DAYS Performed at [facility]    Report Status 02/03/2014 FINAL  Final  Surgical pcr screen     Status: None   Collection Time: 01/28/14 11:20 AM  Result Value Ref Range Status   MRSA, PCR NEGATIVE NEGATIVE Final   Staphylococcus aureus NEGATIVE NEGATIVE Final    Comment:        The Xpert SA Assay (FDA approved for NASAL specimens in patients over 79 years of age), is one component of a comprehensive surveillance program.  Test performance has been validated by [employer] for patients greater than or equal to 30 year old. It is not intended to diagnose infection nor to guide or monitor treatment.   Clostridium Difficile by PCR     Status: None   Collection Time: 02/04/14  8:00 AM  Result Value Ref Range Status   Toxigenic C Difficile by pcr NEGATIVE NEGATIVE Final    Coagulation Studies:  Recent Labs  08/23/15 1033  LABPROT 16.0*  INR 1.26    Imaging: Ct Head Wo Contrast  08/23/2015  CLINICAL DATA:  Left facial numbness, drooping, and slurred speech. EXAM: CT HEAD WITHOUT CONTRAST TECHNIQUE: Contiguous axial images were obtained from the base of the skull through the  vertex without intravenous contrast. COMPARISON:  CT of the head 01/31/2014 FINDINGS: Brain: No evidence of acute infarction, hemorrhage, extra-axial collection, ventriculomegaly, or mass effect. There is mild brain parenchymal atrophy. Stable changes from white matter microvascular ischemia are also noted. Previously-seen right thalamic lacunar infarct is stable. Vascular: No hyperdense vessel or unexpected calcification. Skull: Negative for fracture or focal lesion. Sinuses/Orbits: Polypoid mucosal thickening within the left maxillary sinus and less so right maxillary sinus is present. Other: None. IMPRESSION: No acute intracranial abnormality. Stable brain parenchymal atrophy and chronic microvascular disease. Stable appearance of right thalamic lacunar infarct. Bilateral maxillary sinus disease which may represent mucous retention cysts and/or chronic sinusitis. Electronically Signed   By: Fidela Salisbury M.D.   On: 08/23/2015 11:30       History and examination documented by Etta Quill PA-C, [facility], [phone]  Assessment:  49. 75 y.o. male presenting with left facial droop and subtle left arm weakness. Suspect a right cerebral hemisphere ischemic infarction. Patient is not classifiable as having failed aspirin therapy, as he had missed a few doses of his ASA prior to symptom onset.  2. 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 No new findings are seen relative to the prior CT scan dated 01/31/14. 3. Stroke Risk Factors - hyperlipidemia and hypertension  Recommendations: 1. HgbA1c, fasting lipid panel 2.  MRI, MRA  of the brain without contrast 3. PT consult, OT consult, Speech consult 4. Echocardiogram 5. Carotid dopplers 6. Prophylactic therapy -Antiplatelet med: Aspirin - dose 325 mg daily 7. Risk factor modification 8. Telemetry monitoring 9. Frequent neuro checks 10 NPO until passes stroke swallow screen 11 please page stroke NP  Or  PA  Or  MD from 8am -4 pm  as this patient from this time will be  followed by the stroke.   You can look them up on [email or web address]  Password [email or web address]   Electronically signed: Dr. Kerney Elbe 08/23/2015, 1:32 PM

## 2015-08-23 NOTE — ED Notes (Signed)
Patient in MRI 

## 2015-08-23 NOTE — ED Notes (Signed)
Confirmed with Dr. Audie Pinto patient could take his home medications of Klor-con, 81mg  Aspirin, and Flo-max.

## 2015-08-23 NOTE — H&P (Signed)
Date: 08/23/2015               Patient Name:  Peter Mann MRN: [id number]  DOB: 12-Mar-1940 Age / Sex: 75 y.o., male   PCP: Nolene Ebbs, MD         Medical Service: Internal Medicine Teaching Service         Attending Physician: Dr. Leonard Schwartz, MD    First Contact: Dr. Tiburcio Pea Pager: [phone]  Second Contact: Dr. Posey Pronto Pager: [phone]       After Hours (After 5p/  First Contact Pager: [phone]  weekends / holidays): Second Contact Pager: [phone]   Chief Complaint: tongue biting  History of Present Illness: Peter Mann is a 75 yo male with HTN, HLD, DMII, and h/o TIA, presenting with 2 day h/o tongue and cheek biting when eating.  He first noticed biting his tongue on Tuesday night.  It has not improved since then.  He has also noticed some slurred speech and that he cannot scrunch his nose on the left side.   He denies weakness, LOC, or bowel/bladder incontinence.  He reports he is supposed to be on ASA daily, but did not take it Monday-Wednesday of this week.  He states that he sometimes does not take it for 3 months.  He reports a h/o TIA, with symptoms relieved when he went back home and started smoking again.  He denies history of stroke, seizures, MI, or CHF.  He has a h/o HTN, DMII (last A1c in Epic of 8.4% in 2015), and HLD.     SHx: He is a current 1/4-1/3 ppd smoker (previously 3 ppd x 55 years). He previously drank extremely heavily, but stopped 25 years ago.  He denies other drugs.  FHx: No family h/o DMII, CHF, or MI.   - Brother: stroke  In the ED, CT head demonstrated stable atrophy, chronic ischemic small vessel disease, and stable previous right thalamic lacunar infarct without evidence of new infarct.  Meds: No current facility-administered medications for this encounter.   Current Outpatient Prescriptions  Medication Sig Dispense Refill  . ALPRAZolam (XANAX) 0.25 MG tablet Take 0.25 mg by mouth 2 (two) times daily as needed for anxiety.     Marland Kitchen amLODipine  (NORVASC) 10 MG tablet Take 10 mg by mouth daily.    Marland Kitchen aspirin 81 MG tablet Take 81 mg by mouth daily.    . Calcium Carb-Cholecalciferol (CALCIUM 600 + D PO) Take 1 tablet by mouth daily.    . carboxymethylcellulose (REFRESH PLUS) 0.5 % SOLN Place 1 drop into both eyes 3 (three) times daily as needed (dry eyes).     Marland Kitchen dexlansoprazole (DEXILANT) 60 MG capsule Take 60 mg by mouth daily.    . fluorometholone (FML) 0.1 % ophthalmic suspension Place 1 drop into the left eye 4 (four) times daily.    Marland Kitchen ibuprofen (ADVIL,MOTRIN) 200 MG tablet Take 200 mg by mouth every 6 (six) hours as needed for moderate pain.     . magnesium hydroxide (MILK OF MAGNESIA) 400 MG/5ML suspension Take 30 mLs by mouth daily as needed for mild constipation.    . meclizine (ANTIVERT) 25 MG tablet Take 25 mg by mouth 3 (three) times daily as needed for dizziness.    . metoprolol (LOPRESSOR) 50 MG tablet Take 50 mg by mouth daily.     . Olopatadine HCl (PATADAY) 0.2 % SOLN Place 1 drop into both eyes daily.    . potassium chloride (K-DUR,KLOR-CON) 10 MEQ tablet Take 10  mEq by mouth daily.    . promethazine (PHENERGAN) 25 MG tablet Take 25 mg by mouth every 6 (six) hours as needed for nausea.     . rosuvastatin (CRESTOR) 20 MG tablet Take 20 mg by mouth daily.    . saxagliptin HCl (ONGLYZA) 5 MG TABS tablet Take 5 mg by mouth daily.    . tamsulosin (FLOMAX) 0.4 MG CAPS capsule Take 0.4 mg by mouth daily after supper.    Marland Kitchen tiZANidine (ZANAFLEX) 4 MG tablet Take 4 mg by mouth every 6 (six) hours as needed for muscle spasms.    . traMADol (ULTRAM) 50 MG tablet Take 1 tablet (50 mg total) by mouth every 12 (twelve) hours as needed for moderate pain. 60 tablet 0    Allergies: Allergies as of 08/23/2015  . (No Known Allergies)   Past Medical History  Diagnosis Date  . Dizziness   . Hypertension   . Hyperlipidemia   . History of tobacco abuse   . Chest pain   . Cancer ([place])     prostrate ca, radiation treatments   Past  Surgical History  Procedure Laterality Date  . Cholecystectomy N/A 01/30/2014    Procedure: LAPAROSCOPIC CHOLECYSTECTOMY;  Surgeon: Ralene Ok, MD;  Location: [facility] OR;  Service: General;  Laterality: N/A;   Family History  Problem Relation Age of Onset  . Cancer Brother   . Cancer Sister    Social History   Social History  . Marital Status: Legally Separated    Spouse Name: N/A  . Number of Children: N/A  . Years of Education: N/A   Occupational History  . Not on file.   Social History Main Topics  . Smoking status: Current Some Day Smoker    Last Attempt to Quit: 02/01/2010  . Smokeless tobacco: Not on file     Comment: 1/3-1/4 pack for 55 years though quite for some time  . Alcohol Use: No     Comment: quit 25 years ago though before "drank so much that you couldn't tell b/t alcohol and wine"  . Drug Use: No  . Sexual Activity: No   Other Topics Concern  . Not on file   Social History Narrative    Review of Systems: Pertinent items noted in HPI and remainder of comprehensive ROS otherwise negative.  Physical Exam: Blood pressure 116/87, pulse 65, temperature 98 F (36.7 C), temperature source Oral, resp. rate 13, SpO2 100 %. Physical Exam  Constitutional: He is oriented to person, place, and time and well-developed, well-nourished, and in no distress. No distress.  HENT:  Head: Normocephalic and atraumatic.  Left facial droop appreciated.  Eyes: EOM are normal. Pupils are equal, round, and reactive to light.  Sclerae pigmented.  Neck: No tracheal deviation present.  Cardiovascular: Normal rate, regular rhythm and normal heart sounds.   Heart sounds distant, but no murmurs or gallops appreciated.  Pulmonary/Chest: Effort normal and breath sounds normal. No stridor. No respiratory distress. He has no wheezes. He has no rales.  Abdominal: Soft. He exhibits no distension. There is no tenderness. There is no rebound and no guarding.  Musculoskeletal: He exhibits  no edema.  Neurological: He is alert and oriented to person, place, and time.  CNII-XII intact, excepting VII demonstrating normal eye closure but nasolabial flattening, inability to smile on the left, and slurred speech.  Strength 5/5 on b/l UE and LE.  Minimal dysmetria to FNF bilaterally. No dysmetria to HKS bilaterally.  Biceps reflexes 3+ and symmetric.  Patellar reflexes 2+ and symmetric with 3-5 beat clonus. No clonus appreciated at ankle.  Skin: Skin is warm and dry. He is not diaphoretic.     Lab results: Basic Metabolic Panel:  Recent Labs  08/23/15 1033 08/23/15 1055  NA 138 141  K 4.4 4.6  CL 107 102  CO2 26  --   GLUCOSE 157* 148*  BUN 10 12  CREATININE 1.39* 1.40*  CALCIUM 9.0  --    Liver Function Tests:  Recent Labs  08/23/15 1033  AST 18  ALT 15*  ALKPHOS 88  BILITOT 0.5  PROT 6.7  ALBUMIN 3.9   No results for input(s): LIPASE, AMYLASE in the last 72 hours. No results for input(s): AMMONIA in the last 72 hours. CBC:  Recent Labs  08/23/15 1033 08/23/15 1055  WBC 3.8*  --   NEUTROABS 2.5  --   HGB 12.8* 14.3  HCT 40.6 42.0  MCV 84.9  --   PLT 179  --    Cardiac Enzymes: No results for input(s): CKTOTAL, CKMB, CKMBINDEX, TROPONINI in the last 72 hours. BNP: No results for input(s): PROBNP in the last 72 hours. D-Dimer: No results for input(s): DDIMER in the last 72 hours. CBG: No results for input(s): GLUCAP in the last 72 hours. Hemoglobin A1C: No results for input(s): HGBA1C in the last 72 hours. Fasting Lipid Panel: No results for input(s): CHOL, HDL, LDLCALC, TRIG, CHOLHDL, LDLDIRECT in the last 72 hours. Thyroid Function Tests: No results for input(s): TSH, T4TOTAL, FREET4, T3FREE, THYROIDAB in the last 72 hours. Anemia Panel: No results for input(s): VITAMINB12, FOLATE, FERRITIN, TIBC, IRON, RETICCTPCT in the last 72 hours. Coagulation:  Recent Labs  08/23/15 1033  LABPROT 16.0*  INR 1.26   Urine Drug Screen: Drugs of  Abuse  No results found for: LABOPIA, COCAINSCRNUR, LABBENZ, AMPHETMU, THCU, LABBARB  Alcohol Level: No results for input(s): ETH in the last 72 hours. Urinalysis: No results for input(s): COLORURINE, LABSPEC, PHURINE, GLUCOSEU, HGBUR, BILIRUBINUR, KETONESUR, PROTEINUR, UROBILINOGEN, NITRITE, LEUKOCYTESUR in the last 72 hours.  Invalid input(s): APPERANCEUR Misc. Labs:   Imaging results:  Ct Head Wo Contrast  08/23/2015  CLINICAL DATA:  Left facial numbness, drooping, and slurred speech. EXAM: CT HEAD WITHOUT CONTRAST TECHNIQUE: Contiguous axial images were obtained from the base of the skull through the vertex without intravenous contrast. COMPARISON:  CT of the head 01/31/2014 FINDINGS: Brain: No evidence of acute infarction, hemorrhage, extra-axial collection, ventriculomegaly, or mass effect. There is mild brain parenchymal atrophy. Stable changes from white matter microvascular ischemia are also noted. Previously-seen right thalamic lacunar infarct is stable. Vascular: No hyperdense vessel or unexpected calcification. Skull: Negative for fracture or focal lesion. Sinuses/Orbits: Polypoid mucosal thickening within the left maxillary sinus and less so right maxillary sinus is present. Other: None. IMPRESSION: No acute intracranial abnormality. Stable brain parenchymal atrophy and chronic microvascular disease. Stable appearance of right thalamic lacunar infarct. Bilateral maxillary sinus disease which may represent mucous retention cysts and/or chronic sinusitis. Electronically Signed   By: Fidela Salisbury M.D.   On: 08/23/2015 11:30    Other results: EKG: normal sinus rhythm.  Assessment & Plan by Problem: Active Problems:   * No active hospital problems. *  Mr. Warshauer is a 75 yo male with HTN, HLD, DMII, and h/o TIA, presenting with 2 day h/o tongue and cheek biting when eating.  Acute Ischemic CVA, HTN, HLD, DMIII: Patient presents with slurred speech and left sided facial droop in  the setting of  noncompliance with ASA therapy.  CT negative for acute infarct, but shows mild atrophy and chronic microvascular ischemia.  As patient's defect has persisted for 2 days, this is likely acute stroke. Neurology was consulted in the ED and recommends normal risk factor modification.  As he was noncompliant with ASA, he is not considered an ASA failure. - ASA 325 mg [ ]  TTE [ ]  MRI/MRA [ ]  A1c [ ]  Lipids [ ]  Carotid Dopplers - Teley - PT/OT/SLP - HOLD Amlodipine and Metoprolol for permissive HTN - Saxagliptin - SSI - Rosuvastatin  BPH: Tamsulosin  Pain/Anxiety:  - Xanax 0.25 mg BID PRN anxiety - Tramadol 50 mg q12h PRN pain  FEN/GI:  - Carb  DVT Ppx: Lovenox  Dispo: Disposition is deferred at this time, awaiting improvement of current medical problems. Anticipated discharge in approximately 1-2 day(s).   The patient does have a current PCP Nolene Ebbs, MD) and does need an [facility] hospital follow-up appointment after discharge.  The patient does not have transportation limitations that hinder transportation to clinic appointments.  Signed: Iline Oven, MD 08/23/2015, 2:39 PM

## 2015-08-23 NOTE — ED Notes (Signed)
CBG 100 

## 2015-08-23 NOTE — ED Notes (Signed)
Pt ambulated from room to hall way to change into a hospital bed.

## 2015-08-23 NOTE — ED Notes (Signed)
Neurology at bedside.

## 2015-08-23 NOTE — ED Notes (Signed)
Patient states L side facial droop that he noticed yesterday morning around 630 with slurred speech.   Patient states that he had noticed some numbness in L face the night before when he was eating because he bit his lip.  Patient denies other symptoms.   No neuro deficits noted in triage.

## 2015-08-24 ENCOUNTER — Inpatient Hospital Stay (HOSPITAL_COMMUNITY): Payer: Medicare Other

## 2015-08-24 DIAGNOSIS — I639 Cerebral infarction, unspecified: Secondary | ICD-10-CM | POA: Insufficient documentation

## 2015-08-24 DIAGNOSIS — I63031 Cerebral infarction due to thrombosis of right carotid artery: Secondary | ICD-10-CM

## 2015-08-24 DIAGNOSIS — E119 Type 2 diabetes mellitus without complications: Secondary | ICD-10-CM | POA: Insufficient documentation

## 2015-08-24 DIAGNOSIS — G459 Transient cerebral ischemic attack, unspecified: Secondary | ICD-10-CM

## 2015-08-24 DIAGNOSIS — I10 Essential (primary) hypertension: Secondary | ICD-10-CM

## 2015-08-24 DIAGNOSIS — E785 Hyperlipidemia, unspecified: Secondary | ICD-10-CM | POA: Insufficient documentation

## 2015-08-24 LAB — URINALYSIS, ROUTINE W REFLEX MICROSCOPIC
Bilirubin Urine: NEGATIVE
Glucose, UA: NEGATIVE mg/dL
Hgb urine dipstick: NEGATIVE
Ketones, ur: NEGATIVE mg/dL
Leukocytes, UA: NEGATIVE
Nitrite: NEGATIVE
Protein, ur: NEGATIVE mg/dL
Specific Gravity, Urine: 1.011 (ref 1.005–1.030)
pH: 7 (ref 5.0–8.0)

## 2015-08-24 LAB — BASIC METABOLIC PANEL
Anion gap: 6 (ref 5–15)
BUN: 9 mg/dL (ref 6–20)
CO2: 28 mmol/L (ref 22–32)
Calcium: 9 mg/dL (ref 8.9–10.3)
Chloride: 105 mmol/L (ref 101–111)
Creatinine, Ser: 1.29 mg/dL — ABNORMAL HIGH (ref 0.61–1.24)
GFR calc Af Amer: >60 mL/min (ref >60)
GFR calc non Af Amer: 52 mL/min — ABNORMAL LOW (ref >60)
Glucose, Bld: 128 mg/dL — ABNORMAL HIGH (ref 65–99)
Potassium: 3.8 mmol/L (ref 3.5–5.1)
Sodium: 139 mmol/L (ref 135–145)

## 2015-08-24 LAB — GLUCOSE, CAPILLARY
Glucose-Capillary: 139 mg/dL — ABNORMAL HIGH (ref 65–99)
Glucose-Capillary: 158 mg/dL — ABNORMAL HIGH (ref 65–99)
Glucose-Capillary: 159 mg/dL — ABNORMAL HIGH (ref 65–99)
Glucose-Capillary: 90 mg/dL (ref 65–99)

## 2015-08-24 LAB — ECHOCARDIOGRAM COMPLETE BUBBLE STUDY
E decel time: 278 msec
FS: 45 % — AB (ref 28–44)
IVS/LV PW RATIO, ED: 0.7
LA ID, A-P, ES: 32 mm
LA diam end sys: 32 mm
LA diam index: 1.59 cm/m2
LA vol A4C: 22.4 ml
LA vol index: 15.4 mL/m2
LA vol: 30.9 mL
LV PW d: 20 mm — AB (ref 0.6–1.1)
LV e' LATERAL: 5.55 cm/s
LVOT area: 3.14 cm2
LVOT diameter: 20 mm
MV Dec: 278
MV pk E vel: 0.7 m/s
P 1/2 time: 941 ms
Reg peak vel: 289 cm/s
TAPSE: 20.7 mm
TDI e' lateral: 5.55
TDI e' medial: 6.64
TR max vel: 289 cm/s

## 2015-08-24 LAB — RAPID URINE DRUG SCREEN, HOSP PERFORMED
Amphetamines: NOT DETECTED
Barbiturates: NOT DETECTED
Benzodiazepines: NOT DETECTED
Cocaine: NOT DETECTED
Opiates: NOT DETECTED
Tetrahydrocannabinol: NOT DETECTED

## 2015-08-24 MED ORDER — METOPROLOL SUCCINATE ER 25 MG PO TB24
50.0000 mg | ORAL_TABLET | Freq: Every day | ORAL | Status: DC
  Administered 2015-08-24: 50 mg via ORAL
  Filled 2015-08-24: qty 2

## 2015-08-24 MED ORDER — AMLODIPINE BESYLATE 10 MG PO TABS
10.0000 mg | ORAL_TABLET | Freq: Every day | ORAL | Status: DC
  Administered 2015-08-24: 10 mg via ORAL
  Filled 2015-08-24: qty 1

## 2015-08-24 MED ORDER — SENNA 8.6 MG PO TABS
1.0000 | ORAL_TABLET | Freq: Every day | ORAL | Status: DC | PRN
  Administered 2015-08-24: 8.6 mg via ORAL
  Filled 2015-08-24: qty 1

## 2015-08-24 MED ORDER — SODIUM CHLORIDE 0.9 % IV SOLN
INTRAVENOUS | Status: AC
  Administered 2015-08-24 – 2015-08-25 (×2): via INTRAVENOUS

## 2015-08-24 MED ORDER — ASPIRIN 325 MG PO TABS
325.0000 mg | ORAL_TABLET | Freq: Every day | ORAL | Status: DC
  Administered 2015-08-24 – 2015-08-27 (×4): 325 mg via ORAL
  Filled 2015-08-24 (×4): qty 1

## 2015-08-24 NOTE — Evaluation (Signed)
Physical Therapy Evaluation Patient Details Name: Peter Mann MRN: [id number] DOB: [birth year] Today's Date: 08/24/2015   History of Present Illness  75 yo male with HTN, HLD, DMII, and h/o TIA, presenting with 2 day h/o tongue and cheek biting when eating. MRI on 6/15 + for acute infarct in the right posterior frontal lobe.   Clinical Impression  Patient presents with functional mobility at his reported baseline.  Discussed safety issues with gait abnormality with shuffling feet and anterior lean including obstacles, clear path, footwear, lighting and (due to reported vision deficits previously,) driving.  States has family that live close and check on him often.  Feel patient will be safe to d/c home without current PT follow up.      Follow Up Recommendations No PT follow up    Equipment Recommendations  None recommended by PT    Recommendations for Other Services       Precautions / Restrictions Precautions Precautions: Fall Restrictions Weight Bearing Restrictions: No      Mobility  Bed Mobility Overal bed mobility: Independent             General bed mobility comments: up in chair  Transfers Overall transfer level: Modified independent Equipment used: None Transfers: Sit to/from Stand Sit to Stand: Supervision         General transfer comment: up in room upon my entry, stands independent from chair except one safety issue when standing with blanket still on him and taking steps; educated not safe due to tripping hazard  Ambulation/Gait Ambulation/Gait assistance: Modified independent (Device/Increase time) Ambulation Distance (Feet): 150 Feet Assistive device: None Gait Pattern/deviations: Step-through pattern;Decreased stride length;Shuffle;Trunk flexed     General Gait Details: anterior lean with shuffling steps, no LOB; was able to heel strike and toe off when cued  Stairs Stairs: Yes Stairs assistance: Supervision Stair Management: One rail  Left;Alternating pattern;Forwards Number of Stairs: 3 General stair comments: turned around on stairs so didn't have to complete whole flight, but needed cues and assist for safety due to wanting to back down or side step down stairs, was safe when negotiation forward  Wheelchair Mobility    Modified Rankin (Stroke Patients Only) Modified Rankin (Stroke Patients Only) Pre-Morbid Rankin Score: No significant disability Modified Rankin: Slight disability     Balance Overall balance assessment: Needs assistance Sitting-balance support: Feet supported;No upper extremity supported Sitting balance-Leahy Scale: Normal     Standing balance support: No upper extremity supported;During functional activity Standing balance-Leahy Scale: Good                   Standardized Balance Assessment Standardized Balance Assessment : Berg Balance Test;Dynamic Gait Index Berg Balance Test Sit to Stand: Able to stand  independently using hands Standing Unsupported: Able to stand safely 2 minutes Sitting with Back Unsupported but Feet Supported on Floor or Stool: Able to sit safely and securely 2 minutes Stand to Sit: Sits safely with minimal use of hands Transfers: Able to transfer safely, minor use of hands Standing Unsupported with Eyes Closed: Able to stand 10 seconds safely Standing Ubsupported with Feet Together: Able to place feet together independently and stand 1 minute safely From Standing, Reach Forward with Outstretched Arm: Can reach confidently >25 cm (10") From Standing Position, Pick up Object from Floor: Able to pick up shoe safely and easily From Standing Position, Turn to Look Behind Over each Shoulder: Looks behind one side only/other side shows less weight shift Turn 360 Degrees: Able to turn 360 degrees  safely but slowly Standing Unsupported, Alternately Place Feet on Step/Stool: Able to stand independently and safely and complete 8 steps in 20 seconds Standing Unsupported,  One Foot in Front: Able to place foot tandem independently and hold 30 seconds Standing on One Leg: Able to lift leg independently and hold equal to or more than 3 seconds Total Score: 50 Dynamic Gait Index Level Surface: Mild Impairment Change in Gait Speed: Mild Impairment Gait with Horizontal Head Turns: Normal Gait with Vertical Head Turns: Normal Gait and Pivot Turn: Mild Impairment Step Over Obstacle: Mild Impairment Step Around Obstacles: Normal Steps: Mild Impairment Total Score: 19       Pertinent Vitals/Pain Pain Assessment: No/denies pain    Home Living Family/patient expects to be discharged to:: Private residence Living Arrangements: Alone Available Help at Discharge: Friend(s);Family;Available PRN/intermittently Type of Home: Apartment Home Access: Stairs to enter   Entrance Stairs-Number of Steps: 3 Home Layout: One level Home Equipment: Cane - single point;Bedside commode      Prior Function Level of Independence: Independent               Hand Dominance   Dominant Hand: Right    Extremity/Trunk Assessment   Upper Extremity Assessment: LUE deficits/detail       LUE Deficits / Details: L hand numbness, relates from several years ago; noted some dysmetria with FNF   Lower Extremity Assessment: RLE deficits/detail;LLE deficits/detail RLE Deficits / Details: AROM and strength WFL, but notes numbness in feet over past 30-40 years (but denies neuropathy), coordination intact to toe taps LLE Deficits / Details: AROM and strength WFL, but notes numbness in feet over past 30-40 years (but denies neuropathy), coordination intact to toe taps  Cervical / Trunk Assessment: Kyphotic  Communication   Communication: HOH  Cognition Arousal/Alertness: Awake/alert Behavior During Therapy: WFL for tasks assessed/performed Overall Cognitive Status: No family/caregiver present to determine baseline cognitive functioning                      General  Comments      Exercises        Assessment/Plan    PT Assessment Patent does not need any further PT services  PT Diagnosis Abnormality of gait   PT Problem List    PT Treatment Interventions     PT Goals (Current goals can be found in the Care Plan section) Acute Rehab PT Goals Patient Stated Goal: get back home PT Goal Formulation: All assessment and education complete, DC therapy    Frequency     Barriers to discharge        Co-evaluation               End of Session   Activity Tolerance: Patient tolerated treatment well Patient left: in bed;with call bell/phone within reach           Time: 1437-1500 PT Time Calculation (min) (ACUTE ONLY): 23 min   Charges:   PT Evaluation $PT Eval Moderate Complexity: 1 Procedure PT Treatments $Gait Training: 8-22 mins   PT G CodesReginia Naas 2015-08-30, 3:52 PM Magda Kiel, [place] August 30, 2015

## 2015-08-24 NOTE — Progress Notes (Signed)
*  PRELIMINARY RESULTS* Echocardiogram 2D Echocardiogram has been performed.  Peter Mann 08/24/2015, 10:01 AM

## 2015-08-24 NOTE — ED Provider Notes (Signed)
CSN: [id number]     Arrival date & time 08/23/15  1002 History   First MD Initiated Contact with Patient 08/23/15 1129     Chief Complaint  Patient presents with  . stroke like symptoms       HPI  Expand All Collapse All   Patient states L side facial droop that he noticed yesterday morning around 630 with slurred speech. Patient states that he had noticed some numbness in L face the night before when he was eating because he bit his lip. Patient denies other symptoms. No neuro deficits noted in triage        Past Medical History  Diagnosis Date  . Dizziness   . Hypertension   . Hyperlipidemia   . History of tobacco abuse   . Chest pain   . Cancer ([place])     prostrate ca, radiation treatments  . Diabetes mellitus without complication [facility])    Past Surgical History  Procedure Laterality Date  . Cholecystectomy N/A 01/30/2014    Procedure: LAPAROSCOPIC CHOLECYSTECTOMY;  Surgeon: Ralene Ok, MD;  Location: [facility] OR;  Service: General;  Laterality: N/A;   Family History  Problem Relation Age of Onset  . Cancer Brother   . Cancer Sister    Social History  Substance Use Topics  . Smoking status: Current Some Day Smoker    Last Attempt to Quit: 02/01/2010  . Smokeless tobacco: None     Comment: 1/3-1/4 pack for 55 years though quite for some time  . Alcohol Use: No     Comment: quit 25 years ago though before "drank so much that you couldn't tell b/t alcohol and wine"    Review of Systems  All other systems reviewed and are negative  Allergies  Review of patient's allergies indicates no known allergies.  Home Medications   Prior to Admission medications   Medication Sig Start Date End Date Taking? Authorizing Provider  ALPRAZolam (XANAX) 0.25 MG tablet Take 0.25 mg by mouth 2 (two) times daily as needed for anxiety.  12/20/12  Yes Historical Provider, MD  amLODipine (NORVASC) 10 MG tablet Take 10 mg by mouth daily.   Yes Historical Provider, MD  aspirin 81  MG tablet Take 81 mg by mouth daily.   Yes Historical Provider, MD  Calcium Carb-Cholecalciferol (CALCIUM 600 + D PO) Take 1 tablet by mouth daily.   Yes Historical Provider, MD  carboxymethylcellulose (REFRESH PLUS) 0.5 % SOLN Place 1 drop into both eyes 3 (three) times daily as needed (dry eyes).    Yes Historical Provider, MD  dexlansoprazole (DEXILANT) 60 MG capsule Take 60 mg by mouth daily.   Yes Historical Provider, MD  fluorometholone (FML) 0.1 % ophthalmic suspension Place 1 drop into the left eye 4 (four) times daily.   Yes Historical Provider, MD  ibuprofen (ADVIL,MOTRIN) 200 MG tablet Take 200 mg by mouth every 6 (six) hours as needed for moderate pain.    Yes Historical Provider, MD  magnesium hydroxide (MILK OF MAGNESIA) 400 MG/5ML suspension Take 30 mLs by mouth daily as needed for mild constipation.   Yes Historical Provider, MD  meclizine (ANTIVERT) 25 MG tablet Take 25 mg by mouth 3 (three) times daily as needed for dizziness.   Yes Historical Provider, MD  metoprolol (LOPRESSOR) 50 MG tablet Take 50 mg by mouth daily.  12/11/12  Yes Historical Provider, MD  Olopatadine HCl (PATADAY) 0.2 % SOLN Place 1 drop into both eyes daily.   Yes Historical Provider, MD  potassium chloride (K-DUR,KLOR-CON) 10 MEQ tablet Take 10 mEq by mouth daily.   Yes Historical Provider, MD  promethazine (PHENERGAN) 25 MG tablet Take 25 mg by mouth every 6 (six) hours as needed for nausea.    Yes Historical Provider, MD  rosuvastatin (CRESTOR) 20 MG tablet Take 20 mg by mouth daily.   Yes Historical Provider, MD  saxagliptin HCl (ONGLYZA) 5 MG TABS tablet Take 5 mg by mouth daily.   Yes Historical Provider, MD  tamsulosin (FLOMAX) 0.4 MG CAPS capsule Take 0.4 mg by mouth daily after supper.   Yes Historical Provider, MD  tiZANidine (ZANAFLEX) 4 MG tablet Take 4 mg by mouth every 6 (six) hours as needed for muscle spasms.   Yes Historical Provider, MD  traMADol (ULTRAM) 50 MG tablet Take 1 tablet (50 mg total)  by mouth every 12 (twelve) hours as needed for moderate pain. 01/31/14  Yes Ripudeep K Rai, MD   BP 145/73 mmHg  Pulse 55  Temp(Src) 98.3 F (36.8 C) (Oral)  Resp 18  Ht 5\' 6"  (1.676 m)  Wt 203 lb 6.4 oz (92.262 kg)  BMI 32.85 kg/m2  SpO2 98% Physical Exam Physical Exam  Nursing note and vitals reviewed. Constitutional: He is oriented to person, place, and time. He appears well-developed and well-nourished. No distress.  HENT:  Head: Normocephalic and atraumatic.  Eyes: Pupils are equal, round, and reactive to light.  Neck: Normal range of motion.  Cardiovascular: Normal rate and intact distal pulses.   Pulmonary/Chest: No respiratory distress.  Abdominal: Normal appearance. He exhibits no distension.  Musculoskeletal: Normal range of motion.  Neurological: He is alert and oriented to person, place, and time.  Patient has a noted facial droop on the left.  Speech is somewhat slurred because of that.  Extremities appear without weakness.  Has some sensory deficit to the face.  Extraocular movements are intact. Skin: Skin is warm and dry. No rash noted.  Psychiatric: He has a normal mood and affect. His behavior is normal.   ED Course  Procedures (including critical care time) Labs Review Labs Reviewed  PROTIME-INR - Abnormal; Notable for the following:    Prothrombin Time 16.0 (*)    All other components within normal limits  CBC - Abnormal; Notable for the following:    WBC 3.8 (*)    Hemoglobin 12.8 (*)    All other components within normal limits  COMPREHENSIVE METABOLIC PANEL - Abnormal; Notable for the following:    Glucose, Bld 157 (*)    Creatinine, Ser 1.39 (*)    ALT 15 (*)    GFR calc non Af Amer 48 (*)    GFR calc Af Amer 55 (*)    All other components within normal limits  CBC - Abnormal; Notable for the following:    WBC 3.6 (*)    All other components within normal limits  GLUCOSE, CAPILLARY - Abnormal; Notable for the following:    Glucose-Capillary 198  (*)    All other components within normal limits  GLUCOSE, CAPILLARY - Abnormal; Notable for the following:    Glucose-Capillary 139 (*)    All other components within normal limits  CBG MONITORING, ED - Abnormal; Notable for the following:    Glucose-Capillary 100 (*)    All other components within normal limits  I-STAT CHEM 8, ED - Abnormal; Notable for the following:    Creatinine, Ser 1.40 (*)    Glucose, Bld 148 (*)    All other components within  normal limits  APTT  DIFFERENTIAL  HEMOGLOBIN 123XX123  BASIC METABOLIC PANEL  URINALYSIS, ROUTINE W REFLEX MICROSCOPIC (NOT AT [facility])  URINE RAPID DRUG SCREEN, HOSP PERFORMED  I-STAT TROPOININ, ED    Imaging Review Ct Head Wo Contrast  08/23/2015  CLINICAL DATA:  Left facial numbness, drooping, and slurred speech. EXAM: CT HEAD WITHOUT CONTRAST TECHNIQUE: Contiguous axial images were obtained from the base of the skull through the vertex without intravenous contrast. COMPARISON:  CT of the head 01/31/2014 FINDINGS: Brain: No evidence of acute infarction, hemorrhage, extra-axial collection, ventriculomegaly, or mass effect. There is mild brain parenchymal atrophy. Stable changes from white matter microvascular ischemia are also noted. Previously-seen right thalamic lacunar infarct is stable. Vascular: No hyperdense vessel or unexpected calcification. Skull: Negative for fracture or focal lesion. Sinuses/Orbits: Polypoid mucosal thickening within the left maxillary sinus and less so right maxillary sinus is present. Other: None. IMPRESSION: No acute intracranial abnormality. Stable brain parenchymal atrophy and chronic microvascular disease. Stable appearance of right thalamic lacunar infarct. Bilateral maxillary sinus disease which may represent mucous retention cysts and/or chronic sinusitis. Electronically Signed   By: Fidela Salisbury M.D.   On: 08/23/2015 11:30   Mr Jodene Nam Head Wo Contrast  08/23/2015  CLINICAL DATA:  Dysarthria and left facial  droop 3 days ago EXAM: MRI HEAD WITHOUT CONTRAST MRA HEAD WITHOUT CONTRAST TECHNIQUE: Multiplanar, multiecho pulse sequences of the brain and surrounding structures were obtained without intravenous contrast. Angiographic images of the head were obtained using MRA technique without contrast. COMPARISON:  CT head 08/23/2015, MRI 03/28/2010 FINDINGS: MRI HEAD FINDINGS Acute infarct in the right posterior frontal lobe measuring approximately 10 x 25 mm. No other areas of acute infarct. Generalized atrophy.  Negative for hydrocephalus. Subcortical and deep white matter hyperintensities bilaterally have progressed since 2012 and are most consistent with chronic microvascular ischemia. Chronic ischemia in the right and left thalamus unchanged from the prior study. Negative for intracranial hemorrhage or fluid collection Negative for mass or edema.  No shift of the midline structures. Mucous retention cyst left maxillary sinus. No orbital lesion. Pituitary and skull base normal. Extensive degenerative change in the cervical spine with spinal stenosis. MRA HEAD FINDINGS Left vertebral dominant and patent to the basilar. 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 PICA patent bilaterally. Fenestration of the mid basilar. No significant basilar stenosis. Superior cerebellar and posterior cerebral arteries patent bilaterally. Internal carotid artery patent bilaterally. Anterior and middle cerebral arteries widely patent. Negative for cerebral aneurysm. IMPRESSION: Acute infarct right posterior frontal lobe Progression of chronic microvascular ischemic changes since 2012 Negative MRA circle of Willis. Electronically Signed   By: Franchot Gallo M.D.   On: 08/23/2015 17:30   Mr [name] Contrast  08/23/2015  CLINICAL DATA:  Dysarthria and left facial droop 3 days ago EXAM: MRI HEAD WITHOUT CONTRAST MRA HEAD WITHOUT CONTRAST TECHNIQUE: Multiplanar, multiecho pulse sequences of the brain and surrounding structures  were obtained without intravenous contrast. Angiographic images of the head were obtained using MRA technique without contrast. COMPARISON:  CT head 08/23/2015, MRI 03/28/2010 FINDINGS: MRI HEAD FINDINGS Acute infarct in the right posterior frontal lobe measuring approximately 10 x 25 mm. No other areas of acute infarct. Generalized atrophy.  Negative for hydrocephalus. Subcortical and deep white matter hyperintensities bilaterally have progressed since 2012 and are most consistent with chronic microvascular ischemia. Chronic ischemia in the right and left thalamus unchanged from the prior study. Negative for intracranial hemorrhage or fluid collection Negative for mass or  edema.  No shift of the midline structures. Mucous retention cyst left maxillary sinus. No orbital lesion. Pituitary and skull base normal. Extensive degenerative change in the cervical spine with spinal stenosis. MRA HEAD FINDINGS Left vertebral dominant and patent to the basilar. 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 PICA patent bilaterally. Fenestration of the mid basilar. No significant basilar stenosis. Superior cerebellar and posterior cerebral arteries patent bilaterally. Internal carotid artery patent bilaterally. Anterior and middle cerebral arteries widely patent. Negative for cerebral aneurysm. IMPRESSION: Acute infarct right posterior frontal lobe Progression of chronic microvascular ischemic changes since 2012 Negative MRA circle of Willis. Electronically Signed   By: Franchot Gallo M.D.   On: 08/23/2015 17:30   I have personally reviewed and evaluated these images and lab results as part of my medical decision-making.   EKG Interpretation   Date/Time:  Thursday August 23 2015 10:13:16 EDT Ventricular Rate:  68 PR Interval:  186 QRS Duration: 92 QT Interval:  390 QTC Calculation: 414 R Axis:   -5 Text Interpretation:  Normal sinus rhythm Minimal voltage criteria for  LVH, may be normal variant Borderline  ECG Confirmed by Tobin Cadiente  MD, Shawon Denzer  ([id number]) on 08/23/2015 1:53:38 PM     Neurology was consulted.  Recommended admission to the internal medicine which was done.  Patient remained stable throughout his stay in the emergency department. MDM   Final diagnoses:  Facial palsy        Leonard Schwartz, MD 08/24/15 [phone]

## 2015-08-24 NOTE — Progress Notes (Signed)
STROKE TEAM PROGRESS NOTE   HISTORY OF PRESENT ILLNESS (per record) Peter Mann is an 75 y.o. male male who was noted 3 days ago to have dysarthria and a left facial droop after going to the [facility]. He made a appointment with primary care MD for today. He went to his appointment and was sent directly to ED. CT head showed no acute pathology. There was question of possible stroke. His last known well was unable to be determined. Patient was not administered IV t-PA secondary to being out of the window. He was admitted for further evaluation and treatment.   SUBJECTIVE (INTERVAL HISTORY) Patient presented with several-day history of slurred speech and left facial weakness. I obtained detailed history of present illness from the patient and reviewed imaging studies. He was on aspirin but states he did not take it for several days prior to the stroke   OBJECTIVE Temp:  [97.4 F (36.3 C)-98.3 F (36.8 C)] 97.4 F (36.3 C) (06/16 0958) Pulse Rate:  [49-74] 71 (06/16 0958) Cardiac Rhythm:  [-] Normal sinus rhythm (06/16 0700) Resp:  [13-21] 18 (06/16 0958) BP: (116-168)/(73-94) 168/84 mmHg (06/16 0958) SpO2:  [94 %-100 %] 98 % (06/16 0958) Weight:  [91.173 kg (201 lb)-92.262 kg (203 lb 6.4 oz)] 92.262 kg (203 lb 6.4 oz) (06/15 2216)  CBC:  Recent Labs Lab 08/23/15 1033 08/23/15 1055 08/23/15 1739  WBC 3.8*  --  3.6*  NEUTROABS 2.5  --   --   HGB 12.8* 14.3 13.1  HCT 40.6 42.0 41.4  MCV 84.9  --  83.6  PLT 179  --  123XX123    Basic Metabolic Panel:  Recent Labs Lab 08/23/15 1033 08/23/15 1055 08/24/15 0724  NA 138 141 139  K 4.4 4.6 3.8  CL 107 102 105  CO2 26  --  28  GLUCOSE 157* 148* 128*  BUN 10 12 9   CREATININE 1.39* 1.40* 1.29*  CALCIUM 9.0  --  9.0    Lipid Panel: No results found for: CHOL, TRIG, HDL, CHOLHDL, VLDL, LDLCALC HgbA1c:  Lab Results  Component Value Date   HGBA1C 8.4* 01/31/2014   Urine Drug Screen: No results found for: LABOPIA, COCAINSCRNUR,  LABBENZ, AMPHETMU, THCU, LABBARB    IMAGING  Ct Head Wo Contrast  08/23/2015  CLINICAL DATA:  Left facial numbness, drooping, and slurred speech. EXAM: CT HEAD WITHOUT CONTRAST TECHNIQUE: Contiguous axial images were obtained from the base of the skull through the vertex without intravenous contrast. COMPARISON:  CT of the head 01/31/2014 FINDINGS: Brain: No evidence of acute infarction, hemorrhage, extra-axial collection, ventriculomegaly, or mass effect. There is mild brain parenchymal atrophy. Stable changes from white matter microvascular ischemia are also noted. Previously-seen right thalamic lacunar infarct is stable. Vascular: No hyperdense vessel or unexpected calcification. Skull: Negative for fracture or focal lesion. Sinuses/Orbits: Polypoid mucosal thickening within the left maxillary sinus and less so right maxillary sinus is present. Other: None. IMPRESSION: No acute intracranial abnormality. Stable brain parenchymal atrophy and chronic microvascular disease. Stable appearance of right thalamic lacunar infarct. Bilateral maxillary sinus disease which may represent mucous retention cysts and/or chronic sinusitis. Electronically Signed   By: Fidela Salisbury M.D.   On: 08/23/2015 11:30   Mr Jodene Nam Head Wo Contrast  08/23/2015  CLINICAL DATA:  Dysarthria and left facial droop 3 days ago EXAM: MRI HEAD WITHOUT CONTRAST MRA HEAD WITHOUT CONTRAST TECHNIQUE: Multiplanar, multiecho pulse sequences of the brain and surrounding structures were obtained without intravenous contrast. Angiographic images of the  head were obtained using MRA technique without contrast. COMPARISON:  CT head 08/23/2015, MRI 03/28/2010 FINDINGS: MRI HEAD FINDINGS Acute infarct in the right posterior frontal lobe measuring approximately 10 x 25 mm. No other areas of acute infarct. Generalized atrophy.  Negative for hydrocephalus. Subcortical and deep white matter hyperintensities bilaterally have progressed since 2012 and are  most consistent with chronic microvascular ischemia. Chronic ischemia in the right and left thalamus unchanged from the prior study. Negative for intracranial hemorrhage or fluid collection Negative for mass or edema.  No shift of the midline structures. Mucous retention cyst left maxillary sinus. No orbital lesion. Pituitary and skull base normal. Extensive degenerative change in the cervical spine with spinal stenosis. MRA HEAD FINDINGS Left vertebral dominant and patent to the basilar. 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 PICA patent bilaterally. Fenestration of the mid basilar. No significant basilar stenosis. Superior cerebellar and posterior cerebral arteries patent bilaterally. Internal carotid artery patent bilaterally. Anterior and middle cerebral arteries widely patent. Negative for cerebral aneurysm. IMPRESSION: Acute infarct right posterior frontal lobe Progression of chronic microvascular ischemic changes since 2012 Negative MRA circle of Willis. Electronically Signed   By: Franchot Gallo M.D.   On: 08/23/2015 17:30   Mr [name] Contrast  08/23/2015  CLINICAL DATA:  Dysarthria and left facial droop 3 days ago EXAM: MRI HEAD WITHOUT CONTRAST MRA HEAD WITHOUT CONTRAST TECHNIQUE: Multiplanar, multiecho pulse sequences of the brain and surrounding structures were obtained without intravenous contrast. Angiographic images of the head were obtained using MRA technique without contrast. COMPARISON:  CT head 08/23/2015, MRI 03/28/2010 FINDINGS: MRI HEAD FINDINGS Acute infarct in the right posterior frontal lobe measuring approximately 10 x 25 mm. No other areas of acute infarct. Generalized atrophy.  Negative for hydrocephalus. Subcortical and deep white matter hyperintensities bilaterally have progressed since 2012 and are most consistent with chronic microvascular ischemia. Chronic ischemia in the right and left thalamus unchanged from the prior study. Negative for intracranial  hemorrhage or fluid collection Negative for mass or edema.  No shift of the midline structures. Mucous retention cyst left maxillary sinus. No orbital lesion. Pituitary and skull base normal. Extensive degenerative change in the cervical spine with spinal stenosis. MRA HEAD FINDINGS Left vertebral dominant and patent to the basilar. 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 PICA patent bilaterally. Fenestration of the mid basilar. No significant basilar stenosis. Superior cerebellar and posterior cerebral arteries patent bilaterally. Internal carotid artery patent bilaterally. Anterior and middle cerebral arteries widely patent. Negative for cerebral aneurysm. IMPRESSION: Acute infarct right posterior frontal lobe Progression of chronic microvascular ischemic changes since 2012 Negative MRA circle of Willis. Electronically Signed   By: Franchot Gallo M.D.   On: 08/23/2015 17:30   Carotid Duplex Mild plaque in bilateral internal carotid arteries with no evidence of a significant stenosis - 1-39%. Vertebral arteries demonstrate antegrade flow.  PHYSICAL EXAM Elderly african [place] male not in distress. . Afebrile. Head is nontraumatic. Neck is supple without bruit.    Cardiac exam no murmur or gallop. Lungs are clear to auscultation. Distal pulses are well felt. Neurological Exam :  Awake alert oriented 3. Mild dysarthria. No aphasia. Extraocular moments are full range without nystagmus. Fundi were not visualized. Vision acuity seems adequate. Visual fields are full to bedside confrontational testing. Pupils equal reactive. Moderate left lower facial weakness. Tongue midline. Motor system exam reveals no upper or lower extremity drift. Symmetric and equal strength in all 4 extremities. Mild weakness of left  grip and intrinsic hand muscles. Mild weakness of left hip flexors and ankle dorsiflexors. Sensation is intact bilaterally. Gait was not tested. Deep tendon reflexes are 1+ symmetric.  Plantars are downgoing. ASSESSMENT/PLAN Peter Mann is a 75 y.o. male with history of HTN, HLD, DMII, and h/o TIA presenting with dysarthria and L facial droop x 3 days. He did not receive IV t-PA due to delay in arrival.   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 Increase to aspirin 325 mg daily. Order written.  Patient counseled to be compliant with his antithrombotic medications  Ongoing aggressive stroke risk factor management  Therapy recommendations:  pending   Disposition:  pending   Hypertension  Stable Permissive hypertension (OK if < 220/120) but gradually normalize in 5-7 days  Long-term BP goal normotensive  Hyperlipidemia  Home meds:  crestor 20, resumed in hospital  LDL pending , goal < 70  Continue statin at discharge  Diabetes  HgbA1c pending, goal < 7.0  Other Stroke Risk Factors  Advanced age  Cigarette smoker, advised to stop smoking  Hx ETOH abuse, stopped 73 y ago  Obesity, Body mass index is 32.85 kg/(m^2)., recommend weight loss, diet and exercise as appropriate   Hx TIA per pt  Family hx stroke (brother)  Other Active Problems  [facility] day # [place] for Pager information 08/24/2015 12:19 PM  I have personally examined this patient, reviewed notes, independently viewed imaging studies, participated in medical decision making and plan of care. I have made any additions or clarifications directly to the above note.  Agree with note above.  He presented with 3 day history of dysarthria and left facial droop due to right brain cortical infarct etiology likely embolic source to be determined. He remains at risk for neurological worsening, recurrent stroke, TIA and needs ongoing stroke evaluation and aggressive risk factor modification. Plan check echocardiogram, carotid Dopplers, TEE and loop recorder insertion. Greater than 50% time during this 35 minute visit was spent on counseling and coordination of care about stroke risk, prevention and treatment Antony Contras, MD Medical Director [place] Pager: [phone] 08/24/2015 3:19 PM    To contact Stroke Continuity provider, please refer to [email or web address] After hours, contact General Neurology

## 2015-08-24 NOTE — Evaluation (Signed)
Occupational Therapy Evaluation and Discharge Patient Details Name: Peter Mann MRN: [id number] DOB: 1941/03/02 Today's Date: 08/24/2015    History of Present Illness 75 yo male with HTN, HLD, DMII, and h/o TIA, presenting with 2 day h/o tongue and cheek biting when eating. MRI on 6/15 + for acute infarct in the right posterior frontal lobe.    Clinical Impression   Pt reports he was independent with ADLs and mobility PTA. Currently pt overall supervision for safety with ADLs and functional mobility at this time. Pt with fair short term memory and problem solving skills; no LOB noted during functional mobility tasks. Pt presenting without UE weakness or sensation changes and no visual deficits. Pt planning to d/c home alone with family checking in on him as needed. No further acute OT needs identified; signing off at this time. Please re-consult if needs change. Thank you for this referral.    Follow Up Recommendations  No OT follow up;Supervision - Intermittent    Equipment Recommendations  None recommended by OT    Recommendations for Other Services PT consult     Precautions / Restrictions Precautions Precautions: Fall Restrictions Weight Bearing Restrictions: No      Mobility Bed Mobility Overal bed mobility: Modified Independent             General bed mobility comments: Use of bed rail for supine to sit  Transfers Overall transfer level: Needs assistance Equipment used: None Transfers: Sit to/from Stand Sit to Stand: Supervision         General transfer comment: Supervision for safety; no physical assist required.     Balance Overall balance assessment: Needs assistance Sitting-balance support: Feet supported;No upper extremity supported Sitting balance-Leahy Scale: Normal     Standing balance support: No upper extremity supported;During functional activity Standing balance-Leahy Scale: Good                              ADL Overall  ADL's : Needs assistance/impaired Eating/Feeding: Independent;Sitting   Grooming: Supervision/safety;Standing   Upper Body Bathing: Supervision/ safety;Standing   Lower Body Bathing: Supervison/ safety   Upper Body Dressing : Supervision/safety;Sitting   Lower Body Dressing: Supervision/safety;Sit to/from stand   Toilet Transfer: Supervision/safety;Ambulation;Regular Glass blower/designer Details (indicate cue type and reason): Simulated by sit to stand from [place] and Hygiene: Supervision/safety;Sit to/from stand       Functional mobility during ADLs: Supervision/safety General ADL Comments: Pt slightly unsteady on feet but no overt LOB noted with functional mobility. Unsure of pts cognitive baseline but pt able to recall events that brought him to the hospital and able to perform path finding with fair short term memory and problem solving.     Vision Vision Assessment?: No apparent visual deficits   Perception     Praxis      Pertinent Vitals/Pain Pain Assessment: No/denies pain     Hand Dominance Right   Extremity/Trunk Assessment Upper Extremity Assessment Upper Extremity Assessment: Overall WFL for tasks assessed   Lower Extremity Assessment Lower Extremity Assessment: Defer to PT evaluation   Cervical / Trunk Assessment Cervical / Trunk Assessment: Normal   Communication Communication Communication: HOH   Cognition Arousal/Alertness: Awake/alert Behavior During Therapy: WFL for tasks assessed/performed Overall Cognitive Status: No family/caregiver present to determine baseline cognitive functioning                     General Comments  Exercises       Shoulder Instructions      Home Living Family/patient expects to be discharged to:: Private residence Living Arrangements: Alone Available Help at Discharge: Friend(s);Family;Available PRN/intermittently Type of Home: Apartment Home Access: Stairs to  enter Entrance Stairs-Number of Steps: 3   Home Layout: One level     Bathroom Shower/Tub: Tub/shower unit Shower/tub characteristics: Curtain Biochemist, clinical: Standard     Home Equipment: Cane - single point;Bedside commode      Lives With: Alone    Prior Functioning/Environment Level of Independence: Independent             OT Diagnosis: Generalized weakness;Cognitive deficits   OT Problem List:     OT Treatment/Interventions:      OT Goals(Current goals can be found in the care plan section) Acute Rehab OT Goals Patient Stated Goal: get back home OT Goal Formulation: All assessment and education complete, DC therapy  OT Frequency:     Barriers to D/C:            Co-evaluation              End of Session Equipment Utilized During Treatment: Gait belt Nurse Communication: Mobility status  Activity Tolerance: Patient tolerated treatment well Patient left: in chair;with call bell/phone within reach   Time: 1351-1408 OT Time Calculation (min): 17 min Charges:  OT General Charges $OT Visit: 1 Procedure OT Evaluation $OT Eval Moderate Complexity: 1 Procedure G-Codes:     Binnie Kand M.S., OTR/L [phone]  08/24/2015, 2:21 PM

## 2015-08-24 NOTE — Evaluation (Signed)
Speech Language Pathology Evaluation Patient Details Name: Peter Mann MRN: [id number] DOB: 1941/01/09 Today's Date: 08/24/2015 Time: [id number] SLP Time Calculation (min) (ACUTE ONLY): 13 min  Problem List:  Patient Active Problem List   Diagnosis Date Noted  . CVA (cerebral vascular accident) ([place]) 08/23/2015  . Abdominal distention   . Gangrenous cholecystitis   . Ileus ([place])   . Abdominal pain 01/28/2014  . Cholelithiasis 01/28/2014  . Cholecystitis 01/28/2014  . Symptomatic cholelithiasis 01/28/2014  . Hyperglycemia 01/28/2014  . Chest pain 02/01/2013  . Essential hypertension 02/01/2013  . Hyperlipidemia 02/01/2013   Past Medical History:  Past Medical History  Diagnosis Date  . Dizziness   . Hypertension   . Hyperlipidemia   . History of tobacco abuse   . Chest pain   . Cancer ([place])     prostrate ca, radiation treatments  . Diabetes mellitus without complication [facility])    Past Surgical History:  Past Surgical History  Procedure Laterality Date  . Cholecystectomy N/A 01/30/2014    Procedure: LAPAROSCOPIC CHOLECYSTECTOMY;  Surgeon: Ralene Ok, MD;  Location: [place];  Service: General;  Laterality: N/A;   HPI:  Butorac is a 75 yo male with HTN, HLD, DMII, tobacco abuse and  TIA, presenting with 2 day h/o tongue and cheek biting when eating, slurred speech and decreased volitional movement with facial muscles. CT head demonstrated stable atrophy, chronic ischemic small vessel disease, and stable remote right thalamic lacunar infarct without evidence of new infarct. MRI pending.   Assessment / Plan / Recommendation Clinical Impression  Pt lives alone; completed 7th grade, retired from various jobs Engineer, materials; picked [employer]) who has a friend who helps him recall appointments as pt reports his memory "has always been bad." Verbal problem solving decreased however suspect he is close to baseline with cognitive functions. Speech intelligible, min dysarthria in  combination with dialect. Recommend short term therapy to faciliate memory with compensatory strategies to increase independence as much as possible.      SLP Assessment  Patient needs continued Speech Lanaguage Pathology Services    Follow Up Recommendations   (TBD)    Frequency and Duration min 2x/week  2 weeks      SLP Evaluation Prior Functioning  Cognitive/Linguistic Baseline: Baseline deficits Baseline deficit details:  (memory)  Lives With: Alone Available Help at Discharge: Friend(s) Education: 7th Vocation: Retired (various jobs, Architect, Education officer, museum)   Cognition  Overall Cognitive Status: Difficult to assess (uncertain how different from baseline) Arousal/Alertness: Awake/alert Orientation Level: Oriented to person;Oriented to place;Oriented to time;Oriented to situation Attention: Sustained Sustained Attention: Appears intact Memory: Impaired Memory Impairment: Storage deficit;Retrieval deficit;Decreased short term memory;Decreased recall of new information;Prospective memory (severe on Cognistat) Awareness: Impaired Awareness Impairment: Anticipatory impairment Problem Solving: Impaired Problem Solving Impairment: Verbal basic Safety/Judgment: Impaired    Comprehension  Auditory Comprehension Overall Auditory Comprehension: Appears within functional limits for tasks assessed Commands:  (difficulty, attention?) Visual Recognition/Discrimination Discrimination: Not tested Reading Comprehension Reading Status: Not tested    Expression Expression Primary Mode of Expression: Verbal Verbal Expression Overall Verbal Expression: Appears within functional limits for tasks assessed Initiation: No impairment Level of Generative/Spontaneous Verbalization: Conversation Repetition: Impaired Level of Impairment: Sentence level Naming: No impairment Pragmatics: No impairment Written Expression Dominant Hand: Right Written Expression: Not tested   Oral /  Motor  Oral Motor/Sensory Function Overall Oral Motor/Sensory Function: Moderate impairment Facial ROM: Reduced left Facial Symmetry: Abnormal symmetry left Motor Speech Overall Motor Speech: Appears within functional limits for tasks assessed (baseline/dialect?) Respiration:  Within functional limits Phonation: Normal Resonance: Within functional limits Articulation: Within functional limitis Intelligibility: Intelligible Motor Planning: Witnin functional limits   GO                    Houston Siren 08/24/2015, 1:37 PM  Orbie Pyo Colvin Caroli.[employer] [phone]

## 2015-08-24 NOTE — Progress Notes (Signed)
Preliminary results by tech - Carotid Duplex Completed. Mild plaque in bilateral internal carotid arteries with no evidence of a significant stenosis - 1-39%. Vertebral arteries demonstrate antegrade flow. Oda Cogan, BS, RDMS, RVT

## 2015-08-24 NOTE — Progress Notes (Signed)
PROGRESS NOTE  Peter Mann [id number] DOB: 1940-07-13 DOA: 08/23/2015 PCP: Philis Fendt, MD  HPI/Recap of past 24 hours:  Pleasant, aaox3, but dysarthria  Assessment/Plan: Active Problems:   CVA (cerebral vascular accident) ([place])  Acute CVA with dysarthria, left facial droop MRI/MRA: Acute infarct right posterior frontal lobe,  Progression of chronic microvascular ischemic changes since 2012 Negative MRA circle of Willis. Reported not taking asa as ordered, he is started on asa 325 and statin Stroke work up and management per neurology, appreciate neurology input   HTN: allow permissive htn for 24-48hrs, then gradually normalized bp  noninsulin dependent dm2, a1c pending, on ssi   Code Status: full  Family Communication: patient   Disposition Plan: home once finish stroke work up   Consultants:  neurology  Procedures:  none  Antibiotics:  none   Objective: BP 145/73 mmHg  Pulse 55  Temp(Src) 98.3 F (36.8 C) (Oral)  Resp 18  Ht 5\' 6"  (1.676 m)  Wt 92.262 kg (203 lb 6.4 oz)  BMI 32.85 kg/m2  SpO2 98%  Intake/Output Summary (Last 24 hours) at 08/24/15 0844 Last data filed at 08/24/15 0500  Gross per 24 hour  Intake      0 ml  Output   1550 ml  Net  -1550 ml   Filed Weights   08/23/15 2216  Weight: 92.262 kg (203 lb 6.4 oz)    Exam:   General:  NAD  Cardiovascular: RRR  Respiratory: CTABL  Abdomen: Soft/ND/NT, positive BS  Musculoskeletal: No Edema  Neuro: left nasal labial flattening, left facial droop, dysarthria, strength in all extremities equal wnl. aaox3  Data Reviewed: Basic Metabolic Panel:  Recent Labs Lab 08/23/15 1033 08/23/15 1055  NA 138 141  K 4.4 4.6  CL 107 102  CO2 26  --   GLUCOSE 157* 148*  BUN 10 12  CREATININE 1.39* 1.40*  CALCIUM 9.0  --    Liver Function Tests:  Recent Labs Lab 08/23/15 1033  AST 18  ALT 15*  ALKPHOS 88  BILITOT 0.5  PROT 6.7  ALBUMIN 3.9   No results for  input(s): LIPASE, AMYLASE in the last 168 hours. No results for input(s): AMMONIA in the last 168 hours. CBC:  Recent Labs Lab 08/23/15 1033 08/23/15 1055 08/23/15 1739  WBC 3.8*  --  3.6*  NEUTROABS 2.5  --   --   HGB 12.8* 14.3 13.1  HCT 40.6 42.0 41.4  MCV 84.9  --  83.6  PLT 179  --  187   Cardiac Enzymes:   No results for input(s): CKTOTAL, CKMB, CKMBINDEX, TROPONINI in the last 168 hours. BNP (last 3 results) No results for input(s): BNP in the last 8760 hours.  ProBNP (last 3 results) No results for input(s): PROBNP in the last 8760 hours.  CBG:  Recent Labs Lab 08/23/15 1936 08/23/15 2253 08/24/15 0704  GLUCAP 100* 198* 139*    No results found for this or any previous visit (from the past 240 hour(s)).   Studies: Ct Head Wo Contrast  08/23/2015  CLINICAL DATA:  Left facial numbness, drooping, and slurred speech. EXAM: CT HEAD WITHOUT CONTRAST TECHNIQUE: Contiguous axial images were obtained from the base of the skull through the vertex without intravenous contrast. COMPARISON:  CT of the head 01/31/2014 FINDINGS: Brain: No evidence of acute infarction, hemorrhage, extra-axial collection, ventriculomegaly, or mass effect. There is mild brain parenchymal atrophy. Stable changes from white matter microvascular ischemia are also noted. Previously-seen right thalamic lacunar  infarct is stable. Vascular: No hyperdense vessel or unexpected calcification. Skull: Negative for fracture or focal lesion. Sinuses/Orbits: Polypoid mucosal thickening within the left maxillary sinus and less so right maxillary sinus is present. Other: None. IMPRESSION: No acute intracranial abnormality. Stable brain parenchymal atrophy and chronic microvascular disease. Stable appearance of right thalamic lacunar infarct. Bilateral maxillary sinus disease which may represent mucous retention cysts and/or chronic sinusitis. Electronically Signed   By: Fidela Salisbury M.D.   On: 08/23/2015 11:30    [name] Head Wo Contrast  08/23/2015  CLINICAL DATA:  Dysarthria and left facial droop 3 days ago EXAM: MRI HEAD WITHOUT CONTRAST MRA HEAD WITHOUT CONTRAST TECHNIQUE: Multiplanar, multiecho pulse sequences of the brain and surrounding structures were obtained without intravenous contrast. Angiographic images of the head were obtained using MRA technique without contrast. COMPARISON:  CT head 08/23/2015, MRI 03/28/2010 FINDINGS: MRI HEAD FINDINGS Acute infarct in the right posterior frontal lobe measuring approximately 10 x 25 mm. No other areas of acute infarct. Generalized atrophy.  Negative for hydrocephalus. Subcortical and deep white matter hyperintensities bilaterally have progressed since 2012 and are most consistent with chronic microvascular ischemia. Chronic ischemia in the right and left thalamus unchanged from the prior study. Negative for intracranial hemorrhage or fluid collection Negative for mass or edema.  No shift of the midline structures. Mucous retention cyst left maxillary sinus. No orbital lesion. Pituitary and skull base normal. Extensive degenerative change in the cervical spine with spinal stenosis. MRA HEAD FINDINGS Left vertebral dominant and patent to the basilar. 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 PICA patent bilaterally. Fenestration of the mid basilar. No significant basilar stenosis. Superior cerebellar and posterior cerebral arteries patent bilaterally. Internal carotid artery patent bilaterally. Anterior and middle cerebral arteries widely patent. Negative for cerebral aneurysm. IMPRESSION: Acute infarct right posterior frontal lobe Progression of chronic microvascular ischemic changes since 2012 Negative MRA circle of Willis. Electronically Signed   By: Franchot Gallo M.D.   On: 08/23/2015 17:30   Mr [name] Contrast  08/23/2015  CLINICAL DATA:  Dysarthria and left facial droop 3 days ago EXAM: MRI HEAD WITHOUT CONTRAST MRA HEAD WITHOUT CONTRAST  TECHNIQUE: Multiplanar, multiecho pulse sequences of the brain and surrounding structures were obtained without intravenous contrast. Angiographic images of the head were obtained using MRA technique without contrast. COMPARISON:  CT head 08/23/2015, MRI 03/28/2010 FINDINGS: MRI HEAD FINDINGS Acute infarct in the right posterior frontal lobe measuring approximately 10 x 25 mm. No other areas of acute infarct. Generalized atrophy.  Negative for hydrocephalus. Subcortical and deep white matter hyperintensities bilaterally have progressed since 2012 and are most consistent with chronic microvascular ischemia. Chronic ischemia in the right and left thalamus unchanged from the prior study. Negative for intracranial hemorrhage or fluid collection Negative for mass or edema.  No shift of the midline structures. Mucous retention cyst left maxillary sinus. No orbital lesion. Pituitary and skull base normal. Extensive degenerative change in the cervical spine with spinal stenosis. MRA HEAD FINDINGS Left vertebral dominant and patent to the basilar. 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 PICA patent bilaterally. Fenestration of the mid basilar. No significant basilar stenosis. Superior cerebellar and posterior cerebral arteries patent bilaterally. Internal carotid artery patent bilaterally. Anterior and middle cerebral arteries widely patent. Negative for cerebral aneurysm. IMPRESSION: Acute infarct right posterior frontal lobe Progression of chronic microvascular ischemic changes since 2012 Negative MRA circle of Willis. Electronically Signed   By: Franchot Gallo M.D.   On:  08/23/2015 17:30    Scheduled Meds: . enoxaparin (LOVENOX) injection  40 mg Subcutaneous Q24H  . insulin aspart  0-15 Units Subcutaneous TID WC  . pantoprazole  40 mg Oral Daily  . rosuvastatin  20 mg Oral Daily  . tamsulosin  0.4 mg Oral QPC supper    Continuous Infusions:    Time spent: 74mins  Ikran Patman MD, PhD  [facility] Pager [phone]. If 7PM-7AM, please contact night-coverage at [email or web address], password [facility] 08/24/2015, 8:44 AM  LOS: 1 day

## 2015-08-24 NOTE — Care Management Important Message (Signed)
Important Message  Patient Details  Name: Peter Mann MRN: [id number] Date of Birth: 02/06/1941   Medicare Important Message Given:  Yes    Loann Quill 08/24/2015, 9:25 AM

## 2015-08-24 NOTE — Progress Notes (Signed)
  Date: 08/24/2015  Patient name: Peter Mann  Medical record number: [id number]  Date of birth: 1940-06-15   I have seen and evaluated Peter Mann and discussed their care with the Residency Team. In brief, patient is a 75 y/o male with PMH of HTN, HLD, DM and TIA who p/w 2 day history of slurred speech and difficulty chewing ( had difficulty coordinating his bites and bit his tongue and cheek). No focal weakness, no LOC, no bowel or bladder incontinence, no fevers, no tingling or numbness.   PMHx, Fam Hx, and/or Soc Hx : Per resident admit note  Filed Vitals:   08/24/15 0630 08/24/15 0958  BP: 145/73 168/84  Pulse: 55 71  Temp: 98.3 F (36.8 C) 97.4 F (36.3 C)  Resp: 18 18   Gen: AAO*3, NAD CVS: RRR, normal heart ounds Lungs: CTA b/l Abd: soft, non tender, BS + Ext: no edema Neuro: Power 5/5 b/l UE, LE, sensation intact, left facial droop +   Assessment and Plan: I have seen and evaluated the patient as outlined above. I agree with the formulated Assessment and Plan as detailed in the residents' note, with the following changes:   1. Acute ischemic CVA: - Patient with acute CVA right posterior frontal lobe on MRI - MRA with progression of chronic microvascular changes - Patient to get ECHO and carotid dopplers today - Neuro follow up noted - Will get OT/OT eval - c/w asa 325 mg. Would consider holding amlodipine and allow permissive HTN - c/w high intensity statin - Patient to be followed by hospitalists from today. Will transfer service   Aldine Contes, MD 6/16/201712:10 PM

## 2015-08-25 ENCOUNTER — Inpatient Hospital Stay (HOSPITAL_COMMUNITY): Payer: Medicare Other

## 2015-08-25 DIAGNOSIS — I639 Cerebral infarction, unspecified: Secondary | ICD-10-CM

## 2015-08-25 LAB — BASIC METABOLIC PANEL
Anion gap: 8 (ref 5–15)
BUN: 11 mg/dL (ref 6–20)
CO2: 26 mmol/L (ref 22–32)
Calcium: 9.2 mg/dL (ref 8.9–10.3)
Chloride: 105 mmol/L (ref 101–111)
Creatinine, Ser: 1.27 mg/dL — ABNORMAL HIGH (ref 0.61–1.24)
GFR calc Af Amer: >60 mL/min (ref >60)
GFR calc non Af Amer: 53 mL/min — ABNORMAL LOW (ref >60)
Glucose, Bld: 126 mg/dL — ABNORMAL HIGH (ref 65–99)
Potassium: 3.8 mmol/L (ref 3.5–5.1)
Sodium: 139 mmol/L (ref 135–145)

## 2015-08-25 LAB — CBC
HCT: 40 % (ref 39.0–52.0)
Hemoglobin: 12.7 g/dL — ABNORMAL LOW (ref 13.0–17.0)
MCH: 26.3 pg (ref 26.0–34.0)
MCHC: 31.8 g/dL (ref 30.0–36.0)
MCV: 83 fL (ref 78.0–100.0)
Platelets: 191 10*3/uL (ref 150–400)
RBC: 4.82 MIL/uL (ref 4.22–5.81)
RDW: 14.4 % (ref 11.5–15.5)
WBC: 3.2 10*3/uL — ABNORMAL LOW (ref 4.0–10.5)

## 2015-08-25 LAB — LIPID PANEL
Cholesterol: 88 mg/dL (ref 0–200)
HDL: 24 mg/dL — ABNORMAL LOW (ref >40)
LDL Cholesterol: 37 mg/dL (ref 0–99)
Total CHOL/HDL Ratio: 3.7 RATIO
Triglycerides: 135 mg/dL (ref <150)
VLDL: 27 mg/dL (ref 0–40)

## 2015-08-25 LAB — GLUCOSE, CAPILLARY
Glucose-Capillary: 113 mg/dL — ABNORMAL HIGH (ref 65–99)
Glucose-Capillary: 158 mg/dL — ABNORMAL HIGH (ref 65–99)
Glucose-Capillary: 87 mg/dL (ref 65–99)
Glucose-Capillary: 125 mg/dL — ABNORMAL HIGH (ref 65–99)

## 2015-08-25 LAB — HEMOGLOBIN A1C
Hgb A1c MFr Bld: 8.5 % — ABNORMAL HIGH (ref 4.8–5.6)
Mean Plasma Glucose: 197 mg/dL

## 2015-08-25 LAB — TSH: TSH: 1.155 u[IU]/mL (ref 0.350–4.500)

## 2015-08-25 MED ORDER — BISACODYL 10 MG RE SUPP
10.0000 mg | Freq: Every day | RECTAL | Status: DC
  Administered 2015-08-25: 10 mg via RECTAL
  Filled 2015-08-25 (×4): qty 1

## 2015-08-25 MED ORDER — SENNOSIDES-DOCUSATE SODIUM 8.6-50 MG PO TABS
2.0000 | ORAL_TABLET | Freq: Two times a day (BID) | ORAL | Status: AC
  Administered 2015-08-25 – 2015-08-26 (×3): 2 via ORAL
  Filled 2015-08-25 (×4): qty 2

## 2015-08-25 MED ORDER — POLYETHYLENE GLYCOL 3350 17 G PO PACK
17.0000 g | PACK | Freq: Every day | ORAL | Status: DC
  Administered 2015-08-25 – 2015-08-27 (×3): 17 g via ORAL
  Filled 2015-08-25 (×3): qty 1

## 2015-08-25 MED ORDER — SODIUM CHLORIDE 0.9 % IV SOLN
INTRAVENOUS | Status: DC
  Administered 2015-08-25: 10:00:00 via INTRAVENOUS

## 2015-08-25 NOTE — Progress Notes (Signed)
STROKE TEAM PROGRESS NOTE   SUBJECTIVE (INTERVAL HISTORY) No family at bedside. Pt neuro stable. Wants to go home. Discussed with him about importance to do TEE and loop recorder. He is OK to stay.    OBJECTIVE Temp:  [97.5 F (36.4 C)-99.1 F (37.3 C)] 98.3 F (36.8 C) (06/17 1030) Pulse Rate:  [52-66] 61 (06/17 1030) Cardiac Rhythm:  [-] Normal sinus rhythm (06/17 0700) Resp:  [18-20] 20 (06/17 1030) BP: (136-166)/(59-89) 156/89 mmHg (06/17 1030) SpO2:  [97 %-100 %] 100 % (06/17 1030)  CBC:  Recent Labs Lab 08/23/15 1033  08/23/15 1739 08/25/15 0540  WBC 3.8*  --  3.6* 3.2*  NEUTROABS 2.5  --   --   --   HGB 12.8*  < > 13.1 12.7*  HCT 40.6  < > 41.4 40.0  MCV 84.9  --  83.6 83.0  PLT 179  --  187 191  < > = values in this interval not displayed.  Basic Metabolic Panel:   Recent Labs Lab 08/24/15 0724 08/25/15 0540  NA 139 139  K 3.8 3.8  CL 105 105  CO2 28 26  GLUCOSE 128* 126*  BUN 9 11  CREATININE 1.29* 1.27*  CALCIUM 9.0 9.2    Lipid Panel:     Component Value Date/Time   CHOL 88 08/25/2015 0540   TRIG 135 08/25/2015 0540   HDL 24* 08/25/2015 0540   CHOLHDL 3.7 08/25/2015 0540   VLDL 27 08/25/2015 0540   LDLCALC 37 08/25/2015 0540   HgbA1c:  Lab Results  Component Value Date   HGBA1C 8.5* 08/23/2015   Urine Drug Screen:     Component Value Date/Time   LABOPIA NONE DETECTED 08/24/2015 1854   COCAINSCRNUR NONE DETECTED 08/24/2015 1854   LABBENZ NONE DETECTED 08/24/2015 1854   AMPHETMU NONE DETECTED 08/24/2015 1854   THCU NONE DETECTED 08/24/2015 1854   LABBARB NONE DETECTED 08/24/2015 1854      IMAGING I have personally reviewed the radiological images below and agree with the radiology interpretations.  Ct Head Wo Contrast 08/23/2015   No acute intracranial abnormality. Stable brain parenchymal atrophy and chronic microvascular disease. Stable appearance of right thalamic lacunar infarct. Bilateral maxillary sinus disease which may  represent mucous retention cysts and/or chronic sinusitis.   Mr Jodene Nam Head Wo Contrast 08/23/2015   Acute infarct right posterior frontal lobe. Progression of chronic microvascular ischemic changes since 2012 Negative MRA circle of Willis.   Carotid Duplex  Mild plaque in bilateral internal carotid arteries with no evidence of a significant stenosis - 1-39%. Vertebral arteries demonstrate antegrade flow.  2-D echocardiogram 08/24/2015 Study Conclusions - Left ventricle: The cavity size was normal. Wall thickness was  increased in a pattern of moderate LVH. Systolic function was  normal. The estimated ejection fraction was in the range of 55%  to 60%. Wall motion was normal; there were no regional wall  motion abnormalities. Doppler parameters are consistent with  abnormal left ventricular relaxation (grade 1 diastolic  dysfunction). - Aortic valve: There was mild regurgitation. - Pulmonary arteries: PA peak pressure: 36 mm Hg (S). - Pericardium, extracardiac: A trivial pericardial effusion was  identified.  LE venous doppler - negative for DVT  TEE pending   PHYSICAL EXAM Elderly african [place] male not in distress. . Afebrile. Head is nontraumatic. Neck is supple without bruit. Cardiac exam no murmur or gallop. Lungs are clear to auscultation. Distal pulses are well felt. Neurological Exam :  Awake alert oriented 3. Mild dysarthria.  No aphasia. Extraocular moments are full range without nystagmus. Fundi were not visualized. Vision acuity seems adequate. Visual fields are full to bedside confrontational testing. Pupils equal reactive. Moderate left lower facial weakness. Tongue midline. Motor system exam reveals no upper or lower extremity drift. Symmetric and equal strength in all 4 extremities. Sensation is intact bilaterally. Gait was not tested due to safety concerns. Deep tendon reflexes are 1+ symmetric. Plantars are downgoing.   ASSESSMENT/PLAN Mr. Yoshiro Eichorst is a  75 y.o. male with history of HTN, HLD, DMII, and h/o TIA presenting with dysarthria and L facial droop x 3 days. He did not receive IV t-PA due to delay in arrival.   Stroke:  Right frontal lobe infarct, embolic secondary to unknown source  Resultant - left facial droop  MRI  Right frontal lobe infarct   MRA  Unremarkable   Carotid Doppler  No significant stenosis   2D Echo  no cardiac source of emboli identified.  need TEE and loop to complete embolic workup  LDL 37  123456 8.5  Lovenox 40 mg sq daily for VTE prophylaxis Diet Carb Modified Fluid consistency:: Thin; Room service appropriate?: Yes  aspirin 81 mg daily prior to admission, now on No antithrombotic. Increase to aspirin 325 mg daily.   Patient counseled to be compliant with his antithrombotic medications  Ongoing aggressive stroke risk factor management  Therapy recommendations:  No PT or OT follow-up recommended. Needs intermittent supervision.   Disposition:  pending   Hypertension  Stable  Permissive hypertension (OK if < 220/120) but gradually normalize in 5-7 days  Long-term BP goal normotensive  Hyperlipidemia  Home meds:  crestor 20, resumed in hospital  LDL 37 , goal < 70  Continue statin at discharge  Diabetes  HgbA1c 8.5, goal < 7.0  DM not in good control  SSI  Management as per primary team  Tobacco abuse  Current smoker  Smoking cessation counseling provided  Pt is willing to quit  Other Stroke Risk Factors  Advanced age  Hx ETOH abuse, stopped 39 y ago  Obesity, Body mass index is 32.85 kg/(m^2)., recommend weight loss, diet and exercise as appropriate   Hx TIA per pt  Family hx stroke (brother)  Other Active Problems  BPH  Pain/anxiety  Creatinine 1.27  Mild leukopenia - 3.2  Hospital day # 2  Rosalin Hawking, MD PhD Stroke Neurology 08/25/2015 3:36 PM   To contact Stroke Continuity provider, please refer to [email or web address] After hours, contact General  Neurology

## 2015-08-25 NOTE — Progress Notes (Signed)
*  PRELIMINARY RESULTS* Vascular Ultrasound Lower extremity venous duplex has been completed.  Preliminary findings: No evidence of DVT or baker's cyst.   Landry Mellow, RDMS, RVT  08/25/2015, 10:47 AM

## 2015-08-25 NOTE — Progress Notes (Signed)
PROGRESS NOTE  Peter Mann [id number] DOB: 1940-05-01 DOA: 08/23/2015 PCP: Philis Fendt, MD  HPI/Recap of past 24 hours:  Pleasant, aaox3, but persistent dysarthria, c/o being constipated  Assessment/Plan: Active Problems:   CVA (cerebral vascular accident) ([place])   Acute CVA (cerebrovascular accident) ([place])   HLD (hyperlipidemia)   Diabetes mellitus type 2, noninsulin dependent ([place])  Acute CVA with dysarthria, left facial droop MRI/MRA: Acute infarct right posterior frontal lobe,  Progression of chronic microvascular ischemic changes since 2012 Negative MRA circle of Willis. Reported not taking asa as ordered, he is started on asa 325 and statin Stroke work up and management per neurology, appreciate neurology input   HTN: allow permissive htn for 24-48hrs, then gradually normalized bp  noninsulin dependent dm2, a1c 8.5, on ssi  Aki? Cr wnl in 2015, cr elevated (1.4) on admission,  ua unremarkable, start ivf, repeat lab in am   Code Status: full  Family Communication: patient   Disposition Plan: home once finish stroke work up   Consultants:  neurology  Procedures:  TEE/loop recorder on 6/19  Antibiotics:  none   Objective: BP 154/78 mmHg  Pulse 66  Temp(Src) 99.1 F (37.3 C) (Oral)  Resp 18  Ht 5\' 6"  (1.676 m)  Wt 92.262 kg (203 lb 6.4 oz)  BMI 32.85 kg/m2  SpO2 100% No intake or output data in the 24 hours ending 08/25/15 0919 Filed Weights   08/23/15 2216  Weight: 92.262 kg (203 lb 6.4 oz)    Exam:   General:  NAD  Cardiovascular: RRR  Respiratory: CTABL  Abdomen: Soft/ND/NT, positive BS  Musculoskeletal: No Edema  Neuro: left nasal labial flattening, left facial droop, dysarthria, strength in all extremities equal wnl. aaox3  Data Reviewed: Basic Metabolic Panel:  Recent Labs Lab 08/23/15 1033 08/23/15 1055 08/24/15 0724 08/25/15 0540  NA 138 141 139 139  K 4.4 4.6 3.8 3.8  CL 107 102 105 105  CO2 26  --  28  26  GLUCOSE 157* 148* 128* 126*  BUN 10 12 9 11   CREATININE 1.39* 1.40* 1.29* 1.27*  CALCIUM 9.0  --  9.0 9.2   Liver Function Tests:  Recent Labs Lab 08/23/15 1033  AST 18  ALT 15*  ALKPHOS 88  BILITOT 0.5  PROT 6.7  ALBUMIN 3.9   No results for input(s): LIPASE, AMYLASE in the last 168 hours. No results for input(s): AMMONIA in the last 168 hours. CBC:  Recent Labs Lab 08/23/15 1033 08/23/15 1055 08/23/15 1739 08/25/15 0540  WBC 3.8*  --  3.6* 3.2*  NEUTROABS 2.5  --   --   --   HGB 12.8* 14.3 13.1 12.7*  HCT 40.6 42.0 41.4 40.0  MCV 84.9  --  83.6 83.0  PLT 179  --  187 191   Cardiac Enzymes:   No results for input(s): CKTOTAL, CKMB, CKMBINDEX, TROPONINI in the last 168 hours. BNP (last 3 results) No results for input(s): BNP in the last 8760 hours.  ProBNP (last 3 results) No results for input(s): PROBNP in the last 8760 hours.  CBG:  Recent Labs Lab 08/24/15 0704 08/24/15 1137 08/24/15 1706 08/24/15 2151 08/25/15 0559  GLUCAP 139* 158* 159* 90 113*    No results found for this or any previous visit (from the past 240 hour(s)).   Studies: No results found.  Scheduled Meds: . aspirin  325 mg Oral Daily  . enoxaparin (LOVENOX) injection  40 mg Subcutaneous Q24H  . insulin aspart  0-15 Units Subcutaneous TID WC  . pantoprazole  40 mg Oral Daily  . rosuvastatin  20 mg Oral Daily  . tamsulosin  0.4 mg Oral QPC supper    Continuous Infusions: . sodium chloride       Time spent: 43mins  Rachal Dvorsky MD, PhD  [facility] Pager [phone]. If 7PM-7AM, please contact night-coverage at [email or web address], password [facility] 08/25/2015, 9:19 AM  LOS: 2 days

## 2015-08-26 LAB — GLUCOSE, CAPILLARY
Glucose-Capillary: 130 mg/dL — ABNORMAL HIGH (ref 65–99)
Glucose-Capillary: 161 mg/dL — ABNORMAL HIGH (ref 65–99)
Glucose-Capillary: 124 mg/dL — ABNORMAL HIGH (ref 65–99)
Glucose-Capillary: 136 mg/dL — ABNORMAL HIGH (ref 65–99)

## 2015-08-26 LAB — BASIC METABOLIC PANEL
Anion gap: 8 (ref 5–15)
BUN: 14 mg/dL (ref 6–20)
CO2: 28 mmol/L (ref 22–32)
Calcium: 9.4 mg/dL (ref 8.9–10.3)
Chloride: 104 mmol/L (ref 101–111)
Creatinine, Ser: 1.33 mg/dL — ABNORMAL HIGH (ref 0.61–1.24)
GFR calc Af Amer: 58 mL/min — ABNORMAL LOW (ref >60)
GFR calc non Af Amer: 50 mL/min — ABNORMAL LOW (ref >60)
Glucose, Bld: 118 mg/dL — ABNORMAL HIGH (ref 65–99)
Potassium: 4 mmol/L (ref 3.5–5.1)
Sodium: 140 mmol/L (ref 135–145)

## 2015-08-26 LAB — CBC
HCT: 39.9 % (ref 39.0–52.0)
Hemoglobin: 12.7 g/dL — ABNORMAL LOW (ref 13.0–17.0)
MCH: 26.3 pg (ref 26.0–34.0)
MCHC: 31.8 g/dL (ref 30.0–36.0)
MCV: 82.6 fL (ref 78.0–100.0)
Platelets: 190 10*3/uL (ref 150–400)
RBC: 4.83 MIL/uL (ref 4.22–5.81)
RDW: 14.3 % (ref 11.5–15.5)
WBC: 3.5 10*3/uL — ABNORMAL LOW (ref 4.0–10.5)

## 2015-08-26 LAB — MAGNESIUM: Magnesium: 2.1 mg/dL (ref 1.7–2.4)

## 2015-08-26 MED ORDER — FLEET ENEMA 7-19 GM/118ML RE ENEM
1.0000 | ENEMA | Freq: Once | RECTAL | Status: AC
Start: 2015-08-26 — End: 2015-08-26
  Administered 2015-08-26: 1 via RECTAL
  Filled 2015-08-26: qty 1

## 2015-08-26 MED ORDER — OLOPATADINE HCL 0.1 % OP SOLN
1.0000 [drp] | Freq: Two times a day (BID) | OPHTHALMIC | Status: DC
  Filled 2015-08-26 (×3): qty 5

## 2015-08-26 MED ORDER — POLYVINYL ALCOHOL 1.4 % OP SOLN
1.0000 [drp] | Freq: Three times a day (TID) | OPHTHALMIC | Status: DC | PRN
  Administered 2015-08-26: 1 [drp] via OPHTHALMIC
  Filled 2015-08-26: qty 15

## 2015-08-26 MED ORDER — FLUOROMETHOLONE 0.1 % OP SUSP
1.0000 [drp] | Freq: Four times a day (QID) | OPHTHALMIC | Status: DC
  Filled 2015-08-26 (×2): qty 5

## 2015-08-26 MED ORDER — CYCLOSPORINE 0.05 % OP EMUL
1.0000 [drp] | Freq: Two times a day (BID) | OPHTHALMIC | Status: DC
  Administered 2015-08-26 – 2015-08-27 (×2): 1 [drp] via OPHTHALMIC
  Filled 2015-08-26 (×3): qty 1

## 2015-08-26 MED ORDER — SODIUM CHLORIDE 0.9 % IV SOLN
INTRAVENOUS | Status: DC
  Administered 2015-08-27: 01:00:00 via INTRAVENOUS

## 2015-08-26 NOTE — Progress Notes (Signed)
Patient stated he has had no bowel movement in 8 days, reported bowel movement to previous nurse on 6/15. Patient is passing gas, active bowel sounds. Enema given, patient had 2 small soft bowel movements. Patient states he feels some relief. Will continue to monitor.

## 2015-08-26 NOTE — Progress Notes (Signed)
PROGRESS NOTE  Elisey Halberg [id number] DOB: 1940/12/16 DOA: 08/23/2015 PCP: Philis Fendt, MD  HPI/Recap of past 24 hours:  Pleasant, aaox3, but persistent dysarthria, ambulating  Assessment/Plan: Active Problems:   CVA (cerebral vascular accident) ([place])   Acute CVA (cerebrovascular accident) ([place])   HLD (hyperlipidemia)   Diabetes mellitus type 2, noninsulin dependent ([place])  Acute CVA with dysarthria, left facial droop MRI/MRA: Acute infarct right posterior frontal lobe,  Progression of chronic microvascular ischemic changes since 2012 Negative MRA circle of Willis. Reported not taking asa as ordered, he is started on asa 325 and statin Stroke work up and management per neurology, appreciate neurology input   HTN: allow permissive htn for 24-48hrs, then gradually normalized bp  noninsulin dependent dm2, a1c 8.5, on ssi  Aki? Vs new baseline Cr wnl in 2015, cr elevated (1.4) on admission,  ua unremarkable, start ivf, repeat lab in am cr remain elevated   Code Status: full  Family Communication: patient   Disposition Plan: home once finish stroke work up   Consultants:  neurology  Procedures:  TEE/loop recorder on 6/19  Antibiotics:  none   Objective: BP 159/86 mmHg  Pulse 73  Temp(Src) 97.8 F (36.6 C) (Oral)  Resp 20  Ht 5\' 6"  (1.676 m)  Wt 92.262 kg (203 lb 6.4 oz)  BMI 32.85 kg/m2  SpO2 97% No intake or output data in the 24 hours ending 08/26/15 0910 Filed Weights   08/23/15 2216  Weight: 92.262 kg (203 lb 6.4 oz)    Exam:   General:  NAD  Cardiovascular: RRR  Respiratory: CTABL  Abdomen: Soft/ND/NT, positive BS  Musculoskeletal: No Edema  Neuro: left nasal labial flattening, left facial droop, dysarthria, strength in all extremities equal wnl. aaox3  Data Reviewed: Basic Metabolic Panel:  Recent Labs Lab 08/23/15 1033 08/23/15 1055 08/24/15 0724 08/25/15 0540 08/26/15 0448  NA 138 141 139 139 140  K 4.4 4.6  3.8 3.8 4.0  CL 107 102 105 105 104  CO2 26  --  28 26 28   GLUCOSE 157* 148* 128* 126* 118*  BUN 10 12 9 11 14   CREATININE 1.39* 1.40* 1.29* 1.27* 1.33*  CALCIUM 9.0  --  9.0 9.2 9.4  MG  --   --   --   --  2.1   Liver Function Tests:  Recent Labs Lab 08/23/15 1033  AST 18  ALT 15*  ALKPHOS 88  BILITOT 0.5  PROT 6.7  ALBUMIN 3.9   No results for input(s): LIPASE, AMYLASE in the last 168 hours. No results for input(s): AMMONIA in the last 168 hours. CBC:  Recent Labs Lab 08/23/15 1033 08/23/15 1055 08/23/15 1739 08/25/15 0540 08/26/15 0448  WBC 3.8*  --  3.6* 3.2* 3.5*  NEUTROABS 2.5  --   --   --   --   HGB 12.8* 14.3 13.1 12.7* 12.7*  HCT 40.6 42.0 41.4 40.0 39.9  MCV 84.9  --  83.6 83.0 82.6  PLT 179  --  187 191 190   Cardiac Enzymes:   No results for input(s): CKTOTAL, CKMB, CKMBINDEX, TROPONINI in the last 168 hours. BNP (last 3 results) No results for input(s): BNP in the last 8760 hours.  ProBNP (last 3 results) No results for input(s): PROBNP in the last 8760 hours.  CBG:  Recent Labs Lab 08/25/15 0559 08/25/15 1125 08/25/15 1642 08/25/15 2147 08/26/15 0636  GLUCAP 113* 158* 87 125* 130*    No results found for this or  any previous visit (from the past 240 hour(s)).   Studies: No results found.  Scheduled Meds: . aspirin  325 mg Oral Daily  . bisacodyl  10 mg Rectal Daily  . enoxaparin (LOVENOX) injection  40 mg Subcutaneous Q24H  . insulin aspart  0-15 Units Subcutaneous TID WC  . pantoprazole  40 mg Oral Daily  . polyethylene glycol  17 g Oral Daily  . rosuvastatin  20 mg Oral Daily  . senna-docusate  2 tablet Oral BID  . tamsulosin  0.4 mg Oral QPC supper    Continuous Infusions:     Time spent: 54mins  Adrielle Polakowski MD, PhD  [facility] Pager [phone]. If 7PM-7AM, please contact night-coverage at [email or web address], password [facility] 08/26/2015, 9:10 AM  LOS: 3 days

## 2015-08-26 NOTE — Progress Notes (Signed)
STROKE TEAM PROGRESS NOTE   SUBJECTIVE (INTERVAL HISTORY) No family at bedside. Pt neuro stable. Sitting in chair. No acute event overnight. Pending TEE and loop.    OBJECTIVE Temp:  [97.7 F (36.5 C)-98.3 F (36.8 C)] 97.8 F (36.6 C) (06/18 0603) Pulse Rate:  [57-80] 73 (06/18 0603) Cardiac Rhythm:  [-] Sinus bradycardia (06/18 0030) Resp:  [20] 20 (06/18 0603) BP: (144-159)/(57-89) 159/86 mmHg (06/18 0603) SpO2:  [97 %-100 %] 97 % (06/18 0603)  CBC:  Recent Labs Lab 08/23/15 1033  08/25/15 0540 08/26/15 0448  WBC 3.8*  < > 3.2* 3.5*  NEUTROABS 2.5  --   --   --   HGB 12.8*  < > 12.7* 12.7*  HCT 40.6  < > 40.0 39.9  MCV 84.9  < > 83.0 82.6  PLT 179  < > 191 190  < > = values in this interval not displayed.  Basic Metabolic Panel:   Recent Labs Lab 08/25/15 0540 08/26/15 0448  NA 139 140  K 3.8 4.0  CL 105 104  CO2 26 28  GLUCOSE 126* 118*  BUN 11 14  CREATININE 1.27* 1.33*  CALCIUM 9.2 9.4  MG  --  2.1    Lipid Panel:     Component Value Date/Time   CHOL 88 08/25/2015 0540   TRIG 135 08/25/2015 0540   HDL 24* 08/25/2015 0540   CHOLHDL 3.7 08/25/2015 0540   VLDL 27 08/25/2015 0540   LDLCALC 37 08/25/2015 0540   HgbA1c:  Lab Results  Component Value Date   HGBA1C 8.5* 08/23/2015   Urine Drug Screen:     Component Value Date/Time   LABOPIA NONE DETECTED 08/24/2015 1854   COCAINSCRNUR NONE DETECTED 08/24/2015 1854   LABBENZ NONE DETECTED 08/24/2015 1854   AMPHETMU NONE DETECTED 08/24/2015 1854   THCU NONE DETECTED 08/24/2015 1854   LABBARB NONE DETECTED 08/24/2015 1854      IMAGING I have personally reviewed the radiological images below and agree with the radiology interpretations.  Ct Head Wo Contrast 08/23/2015   No acute intracranial abnormality. Stable brain parenchymal atrophy and chronic microvascular disease. Stable appearance of right thalamic lacunar infarct. Bilateral maxillary sinus disease which may represent mucous retention  cysts and/or chronic sinusitis.   [name] Head Wo Contrast 08/23/2015   Acute infarct right posterior frontal lobe. Progression of chronic microvascular ischemic changes since 2012 Negative MRA circle of Willis.   Carotid Duplex  Mild plaque in bilateral internal carotid arteries with no evidence of a significant stenosis - 1-39%. Vertebral arteries demonstrate antegrade flow.  2-D echocardiogram 08/24/2015 Study Conclusions - Left ventricle: The cavity size was normal. Wall thickness was  increased in a pattern of moderate LVH. Systolic function was  normal. The estimated ejection fraction was in the range of 55%  to 60%. Wall motion was normal; there were no regional wall  motion abnormalities. Doppler parameters are consistent with  abnormal left ventricular relaxation (grade 1 diastolic  dysfunction). - Aortic valve: There was mild regurgitation. - Pulmonary arteries: PA peak pressure: 36 mm Hg (S). - Pericardium, extracardiac: A trivial pericardial effusion was  identified.  LE venous doppler - negative for DVT  TEE pending   PHYSICAL EXAM Elderly african [place] male not in distress. . Afebrile. Head is nontraumatic. Neck is supple without bruit. Cardiac exam no murmur or gallop. Lungs are clear to auscultation. Distal pulses are well felt. Neurological Exam :  Awake alert oriented 3. Mild dysarthria. No aphasia. Extraocular moments  are full range without nystagmus. Fundi were not visualized. Vision acuity seems adequate. Visual fields are full to bedside confrontational testing. Pupils equal reactive. Moderate left lower facial weakness. Tongue midline. Motor system exam reveals no upper or lower extremity drift. Symmetric and equal strength in all 4 extremities. Sensation is intact bilaterally. Gait was not tested due to safety concerns. Deep tendon reflexes are 1+ symmetric. Plantars are downgoing.   ASSESSMENT/PLAN [name] is a 75 y.o. male with history  of HTN, HLD, DMII, and h/o TIA presenting with dysarthria and L facial droop x 3 days. He did not receive IV t-PA due to delay in arrival.   Stroke:  Right frontal lobe infarct, embolic secondary to unknown source  Resultant - left facial droop  MRI  Right frontal lobe infarct   MRA  Unremarkable   Carotid Doppler  No significant stenosis   2D Echo  no cardiac source of emboli identified.  Need TEE and loop to complete embolic workup - tentatively scheduled for Monday. Cards notified.  LDL 37  HgbA1c 8.5  Lovenox 40 mg sq daily for VTE prophylaxis Diet Carb Modified Fluid consistency:: Thin; Room service appropriate?: Yes Diet NPO time specified Except for: Sips with Meds - Monday for TEE.  aspirin 81 mg daily prior to admission, now on No antithrombotic. Increase to aspirin 325 mg daily.   Patient counseled to be compliant with his antithrombotic medications  Ongoing aggressive stroke risk factor management  Therapy recommendations:  No PT or OT follow-up recommended. Needs intermittent supervision.   Disposition:  pending   Hypertension  Stable  Permissive hypertension (OK if < 220/120) but gradually normalize in 5-7 days  Long-term BP goal normotensive  Hyperlipidemia  Home meds:  Crestor 20, resumed in hospital  LDL 37 , goal < 70  Continue statin at discharge  Diabetes  HgbA1c 8.5, goal < 7.0  DM not in good control  SSI  Management as per primary team  Tobacco abuse  Current smoker  Smoking cessation counseling provided  Pt is willing to quit  Other Stroke Risk Factors  Advanced age  Hx ETOH abuse, stopped 25 y ago  Obesity, Body mass index is 32.85 kg/(m^2)., recommend weight loss, diet and exercise as appropriate   Hx TIA per pt  Family hx stroke (brother)  Other Active Problems  BPH  Pain/anxiety  Creatinine 1.27 -> 1.33  Mild leukopenia - 3.2 -> 3.5  Hospital day # 3  Peter Hawking, MD PhD Stroke  Neurology 08/26/2015 2:50 PM    To contact Stroke Continuity provider, please refer to [email or web address] After hours, contact General Neurology

## 2015-08-27 ENCOUNTER — Inpatient Hospital Stay (HOSPITAL_COMMUNITY): Payer: Medicare Other

## 2015-08-27 DIAGNOSIS — G510 Bell's palsy: Secondary | ICD-10-CM | POA: Insufficient documentation

## 2015-08-27 DIAGNOSIS — R471 Dysarthria and anarthria: Secondary | ICD-10-CM

## 2015-08-27 LAB — BASIC METABOLIC PANEL
Anion gap: 6 (ref 5–15)
BUN: 17 mg/dL (ref 6–20)
CO2: 26 mmol/L (ref 22–32)
Calcium: 8.7 mg/dL — ABNORMAL LOW (ref 8.9–10.3)
Chloride: 106 mmol/L (ref 101–111)
Creatinine, Ser: 1.47 mg/dL — ABNORMAL HIGH (ref 0.61–1.24)
GFR calc Af Amer: 52 mL/min — ABNORMAL LOW (ref >60)
GFR calc non Af Amer: 45 mL/min — ABNORMAL LOW (ref >60)
Glucose, Bld: 130 mg/dL — ABNORMAL HIGH (ref 65–99)
Potassium: 3.4 mmol/L — ABNORMAL LOW (ref 3.5–5.1)
Sodium: 138 mmol/L (ref 135–145)

## 2015-08-27 LAB — CBC
HCT: 37.6 % — ABNORMAL LOW (ref 39.0–52.0)
Hemoglobin: 12 g/dL — ABNORMAL LOW (ref 13.0–17.0)
MCH: 26.4 pg (ref 26.0–34.0)
MCHC: 31.9 g/dL (ref 30.0–36.0)
MCV: 82.6 fL (ref 78.0–100.0)
Platelets: 173 10*3/uL (ref 150–400)
RBC: 4.55 MIL/uL (ref 4.22–5.81)
RDW: 14.3 % (ref 11.5–15.5)
WBC: 3.9 10*3/uL — ABNORMAL LOW (ref 4.0–10.5)

## 2015-08-27 LAB — GLUCOSE, CAPILLARY
Glucose-Capillary: 119 mg/dL — ABNORMAL HIGH (ref 65–99)
Glucose-Capillary: 107 mg/dL — ABNORMAL HIGH (ref 65–99)
Glucose-Capillary: 120 mg/dL — ABNORMAL HIGH (ref 65–99)

## 2015-08-27 MED ORDER — POLYETHYLENE GLYCOL 3350 17 G PO PACK: 17.0000 g | PACK | Freq: Every day | ORAL | Status: DC

## 2015-08-27 MED ORDER — ASPIRIN 325 MG PO TABS: 325.0000 mg | ORAL_TABLET | Freq: Every day | ORAL | Status: DC

## 2015-08-27 MED ORDER — POTASSIUM CHLORIDE CRYS ER 20 MEQ PO TBCR
40.0000 meq | EXTENDED_RELEASE_TABLET | Freq: Once | ORAL | Status: AC
Start: 2015-08-27 — End: 2015-08-27
  Administered 2015-08-27: 40 meq via ORAL
  Filled 2015-08-27: qty 2

## 2015-08-27 MED ORDER — METOPROLOL TARTRATE 25 MG PO TABS: 12.5000 mg | ORAL_TABLET | Freq: Two times a day (BID) | ORAL | Status: DC

## 2015-08-27 MED ORDER — METOPROLOL TARTRATE 12.5 MG HALF TABLET
12.5000 mg | ORAL_TABLET | Freq: Two times a day (BID) | ORAL | Status: DC
  Filled 2015-08-27: qty 1

## 2015-08-27 NOTE — Progress Notes (Signed)
PROGRESS NOTE  Peter Mann [id number] DOB: 07-04-1940 DOA: 08/23/2015 PCP: Philis Fendt, MD  HPI/Recap of past 24 hours:  Pleasant, aaox3,  Dysarthria has improved, ambulating  Assessment/Plan: Active Problems:   CVA (cerebral vascular accident) ([place])   Acute CVA (cerebrovascular accident) ([place])   HLD (hyperlipidemia)   Diabetes mellitus type 2, noninsulin dependent ([place])  Acute CVA with dysarthria, left facial droop MRI/MRA: Acute infarct right posterior frontal lobe,  Progression of chronic microvascular ischemic changes since 2012 Negative MRA circle of Willis. Reported not taking asa as ordered prior to hospitalization, he is started on asa 325 and statin Stroke work up and management per neurology, appreciate neurology input TEE/loop recorder placement on 6/20   HTN:  Home bp meds norvasc and metop held to allow permissive htn for 24-48hrs, then gradually normalized bp -restart low dose lopressor on 6/20  noninsulin dependent dm2, a1c 8.5, on ssi  Aki? Vs new baseline Cr wnl in 2015, cr elevated (1.4) on admission,  ua unremarkable, start ivf, repeat lab in am cr remain elevated, renal US pending   Chronic constipation: received enema, now on stool softener, tsh wnl  Code Status: full  Family Communication: patient   Disposition Plan: home once finish stroke work up, likely 6/20   Consultants:  neurology  Procedures:  TEE/loop recorder on 6/20  Antibiotics:  none   Objective: BP 123/87 mmHg  Pulse 71  Temp(Src) 98.9 F (37.2 C) (Oral)  Resp 20  Ht 5\' 6"  (1.676 m)  Wt 92.262 kg (203 lb 6.4 oz)  BMI 32.85 kg/m2  SpO2 98% No intake or output data in the 24 hours ending 08/27/15 1223 Filed Weights   08/23/15 2216  Weight: 92.262 kg (203 lb 6.4 oz)    Exam:   General:  NAD  Cardiovascular: RRR  Respiratory: CTABL  Abdomen: Soft/ND/NT, positive BS  Musculoskeletal: No Edema  Neuro: left nasal labial flattening, left  facial droop, less dysarthria, strength in all extremities equal wnl. aaox3  Data Reviewed: Basic Metabolic Panel:  Recent Labs Lab 08/23/15 1033 08/23/15 1055 08/24/15 0724 08/25/15 0540 08/26/15 0448 08/27/15 0429  NA 138 141 139 139 140 138  K 4.4 4.6 3.8 3.8 4.0 3.4*  CL 107 102 105 105 104 106  CO2 26  --  28 26 28 26   GLUCOSE 157* 148* 128* 126* 118* 130*  BUN 10 12 9 11 14 17   CREATININE 1.39* 1.40* 1.29* 1.27* 1.33* 1.47*  CALCIUM 9.0  --  9.0 9.2 9.4 8.7*  MG  --   --   --   --  2.1  --    Liver Function Tests:  Recent Labs Lab 08/23/15 1033  AST 18  ALT 15*  ALKPHOS 88  BILITOT 0.5  PROT 6.7  ALBUMIN 3.9   No results for input(s): LIPASE, AMYLASE in the last 168 hours. No results for input(s): AMMONIA in the last 168 hours. CBC:  Recent Labs Lab 08/23/15 1033 08/23/15 1055 08/23/15 1739 08/25/15 0540 08/26/15 0448 08/27/15 0429  WBC 3.8*  --  3.6* 3.2* 3.5* 3.9*  NEUTROABS 2.5  --   --   --   --   --   HGB 12.8* 14.3 13.1 12.7* 12.7* 12.0*  HCT 40.6 42.0 41.4 40.0 39.9 37.6*  MCV 84.9  --  83.6 83.0 82.6 82.6  PLT 179  --  187 191 190 173   Cardiac Enzymes:   No results for input(s): CKTOTAL, CKMB, CKMBINDEX, TROPONINI in the  last 168 hours. BNP (last 3 results) No results for input(s): BNP in the last 8760 hours.  ProBNP (last 3 results) No results for input(s): PROBNP in the last 8760 hours.  CBG:  Recent Labs Lab 08/26/15 1136 08/26/15 1627 08/26/15 2131 08/27/15 0608 08/27/15 1215  GLUCAP 161* 124* 136* 119* 107*    No results found for this or any previous visit (from the past 240 hour(s)).   Studies: US Renal  08/27/2015  CLINICAL DATA:  Worsening renal function. Elevated creatinine. Hypertension and diabetes. EXAM: RENAL / URINARY TRACT ULTRASOUND COMPLETE COMPARISON:  CT 05/06/2015 FINDINGS: Right Kidney: Length: 10.9 cm. Echogenicity within normal limits. No mass or hydronephrosis visualized. Left Kidney: Length: 11.3  cm. Echogenicity within normal limits. No mass or hydronephrosis visualized. Bladder: Appears normal for degree of bladder distention. IMPRESSION: Normal renal ultrasound. 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 Electronically Signed   By: Nelson Chimes M.D.   On: 08/27/2015 11:56    Scheduled Meds: . aspirin  325 mg Oral Daily  . bisacodyl  10 mg Rectal Daily  . cycloSPORINE  1 drop Left Eye Q12H  . enoxaparin (LOVENOX) injection  40 mg Subcutaneous Q24H  . insulin aspart  0-15 Units Subcutaneous TID WC  . pantoprazole  40 mg Oral Daily  . polyethylene glycol  17 g Oral Daily  . potassium chloride  40 mEq Oral Once  . rosuvastatin  20 mg Oral Daily  . tamsulosin  0.4 mg Oral QPC supper    Continuous Infusions: . sodium chloride 75 mL/hr at 08/27/15 0112     Time spent: 32mins  Surena Welge MD, PhD  [facility] Pager [phone]. If 7PM-7AM, please contact night-coverage at [email or web address], password [facility] 08/27/2015, 12:23 PM  LOS: 4 days

## 2015-08-27 NOTE — Consult Note (Signed)
ELECTROPHYSIOLOGY CONSULT NOTE  Patient ID: Peter Mann MRN: [id number], DOB/AGE: 1940-09-09   Admit date: 08/23/2015 Date of Consult: 08/27/2015  Primary Physician: Peter Fendt, MD Primary Cardiologist: Peter Mann Reason for Consultation: Cryptogenic stroke; recommendations regarding Implantable Loop Recorder  History of Present Illness Peter Mann was admitted on 08/23/2015 with dysarthria and facial droop.  They first developed symptoms 3 days prior to admission.  Imaging demonstrated right frontal lobe infarct felt to be embolic 2/2 unknown source.  He has undergone workup for stroke including echocardiogram and carotid dopplers.  The patient has been monitored on telemetry which has demonstrated sinus rhythm with no arrhythmias.  Inpatient stroke work-up is to be completed with a TEE.   Echocardiogram this admission demonstrated EF 0000000, grade 1 diastolic dysfunction, mild AR, PA pressure 36, LA 32.  Lab work is reviewed.  Prior to admission, the patient denies chest pain, shortness of breath, dizziness, palpitations, or syncope.  They are recovering from their stroke with plans to return home at discharge.  EP has been asked to evaluate for placement of an implantable loop recorder to monitor for atrial fibrillation.   Past Medical History  Diagnosis Date  . Dizziness   . Hypertension   . Hyperlipidemia   . History of tobacco abuse   . Chest pain   . Cancer ([place])     prostrate ca, radiation treatments  . Diabetes mellitus without complication [facility])      Surgical History:  Past Surgical History  Procedure Laterality Date  . Cholecystectomy N/A 01/30/2014    Procedure: LAPAROSCOPIC CHOLECYSTECTOMY;  Surgeon: Ralene Ok, MD;  Location: [place];  Service: General;  Laterality: N/A;     Prescriptions prior to admission  Medication Sig Dispense Refill Last Dose  . ALPRAZolam (XANAX) 0.25 MG tablet Take 0.25 mg by mouth daily.    08/23/2015  . amLODipine (NORVASC) 10  MG tablet Take 10 mg by mouth daily.   08/23/2015  . aspirin EC 81 MG tablet Take 81 mg by mouth daily.   08/23/2015  . Calcium Carb-Cholecalciferol (CALCIUM 600+D) 600-800 MG-UNIT TABS Take 1 tablet by mouth 2 (two) times daily.   08/23/2015 at am  . cycloSPORINE (RESTASIS) 0.05 % ophthalmic emulsion Place 1 drop into the left eye every 12 (twelve) hours.   08/23/2015 at 600  . dexlansoprazole (DEXILANT) 60 MG capsule Take 60 mg by mouth daily.   08/23/2015  . ibuprofen (ADVIL,MOTRIN) 200 MG tablet Take 200 mg by mouth every 6 (six) hours as needed for moderate pain.    2 weeks ago  . magnesium hydroxide (MILK OF MAGNESIA) 400 MG/5ML suspension Take 60 mLs by mouth daily as needed for mild constipation.    08/22/2015  . meclizine (ANTIVERT) 25 MG tablet Take 25 mg by mouth 2 (two) times daily as needed for dizziness.    08/22/2015  . metoprolol (LOPRESSOR) 50 MG tablet Take 50 mg by mouth daily.    08/23/2015 at 630  . potassium chloride (K-DUR,KLOR-CON) 10 MEQ tablet Take 10 mEq by mouth daily.   08/23/2015  . rosuvastatin (CRESTOR) 20 MG tablet Take 20 mg by mouth daily.   08/23/2015  . saxagliptin HCl (ONGLYZA) 5 MG TABS tablet Take 5 mg by mouth daily.   08/23/2015 at am  . tamsulosin (FLOMAX) 0.4 MG CAPS capsule Take 0.4 mg by mouth daily.    08/23/2015 at am  . tiZANidine (ZANAFLEX) 4 MG tablet Take 4 mg by mouth at bedtime.  08/22/2015  . traMADol (ULTRAM) 50 MG tablet Take 1 tablet (50 mg total) by mouth every 12 (twelve) hours as needed for moderate pain. (Patient taking differently: Take 50 mg by mouth at bedtime. ) 60 tablet 0 08/22/2015    Inpatient Medications:  . aspirin  325 mg Oral Daily  . bisacodyl  10 mg Rectal Daily  . cycloSPORINE  1 drop Left Eye Q12H  . enoxaparin (LOVENOX) injection  40 mg Subcutaneous Q24H  . insulin aspart  0-15 Units Subcutaneous TID WC  . pantoprazole  40 mg Oral Daily  . polyethylene glycol  17 g Oral Daily  . rosuvastatin  20 mg Oral Daily  . tamsulosin   0.4 mg Oral QPC supper    Allergies: No Known Allergies  Social History   Social History  . Marital Status: Legally Separated    Spouse Name: N/A  . Number of Children: N/A  . Years of Education: N/A   Occupational History  . Not on file.   Social History Main Topics  . Smoking status: Current Some Day Smoker    Last Attempt to Quit: 02/01/2010  . Smokeless tobacco: Not on file     Comment: 1/3-1/4 pack for 55 years though quite for some time  . Alcohol Use: No     Comment: quit 25 years ago though before "drank so much that you couldn't tell b/t alcohol and wine"  . Drug Use: No  . Sexual Activity: No   Other Topics Concern  . Not on file   Social History Narrative     Family History  Problem Relation Age of Onset  . Cancer Brother   . Cancer Sister       Review of Systems: All other systems reviewed and are otherwise negative except as noted above.  Physical Exam: Filed Vitals:   08/26/15 1833 08/26/15 2135 08/27/15 0209 08/27/15 0601  BP: 138/69 134/65 141/77 161/89  Pulse: 72 67 69 63  Temp: 98 F (36.7 C) 98.5 F (36.9 C) 98 F (36.7 C) 98.6 F (37 C)  TempSrc: Oral Oral Oral Oral  Resp: 20 20 16 16   Height:      Weight:      SpO2: 98% 97% 99% 100%    GEN- The patient is well appearing, alert and oriented x 3 today.   Head- normocephalic, atraumatic Eyes-  Sclera clear, conjunctiva pink Ears- hearing intact Oropharynx- clear Neck- supple Lungs- Clear to ausculation bilaterally, normal work of breathing Heart- Regular rate and rhythm, no murmurs, rubs or gallops  GI- soft, NT, ND, + BS Extremities- no clubbing, cyanosis, or edema MS- no significant deformity or atrophy Skin- no rash or lesion Psych- euthymic mood, full affect   Labs:   Lab Results  Component Value Date   WBC 3.9* 08/27/2015   HGB 12.0* 08/27/2015   HCT 37.6* 08/27/2015   MCV 82.6 08/27/2015   PLT 173 08/27/2015    Recent Labs Lab 08/23/15 1033  08/27/15 0429    NA 138  < > 138  K 4.4  < > 3.4*  CL 107  < > 106  CO2 26  < > 26  BUN 10  < > 17  CREATININE 1.39*  < > 1.47*  CALCIUM 9.0  < > 8.7*  PROT 6.7  --   --   BILITOT 0.5  --   --   ALKPHOS 88  --   --   ALT 15*  --   --  AST 18  --   --   GLUCOSE 157*  < > 130*  < > = values in this interval not displayed.   Radiology/Studies: Ct Head Wo Contrast 08/23/2015  CLINICAL DATA:  Left facial numbness, drooping, and slurred speech. EXAM: CT HEAD WITHOUT CONTRAST TECHNIQUE: Contiguous axial images were obtained from the base of the skull through the vertex without intravenous contrast. COMPARISON:  CT of the head 01/31/2014 FINDINGS: Brain: No evidence of acute infarction, hemorrhage, extra-axial collection, ventriculomegaly, or mass effect. There is mild brain parenchymal atrophy. Stable changes from white matter microvascular ischemia are also noted. Previously-seen right thalamic lacunar infarct is stable. Vascular: No hyperdense vessel or unexpected calcification. Skull: Negative for fracture or focal lesion. Sinuses/Orbits: Polypoid mucosal thickening within the left maxillary sinus and less so right maxillary sinus is present. Other: None. IMPRESSION: No acute intracranial abnormality. Stable brain parenchymal atrophy and chronic microvascular disease. Stable appearance of right thalamic lacunar infarct. Bilateral maxillary sinus disease which may represent mucous retention cysts and/or chronic sinusitis. Electronically Signed   By: Fidela Salisbury M.D.   On: 08/23/2015 11:30   Mr Jodene Nam Head Wo Contrast 08/23/2015  CLINICAL DATA:  Dysarthria and left facial droop 3 days ago EXAM: MRI HEAD WITHOUT CONTRAST MRA HEAD WITHOUT CONTRAST TECHNIQUE: Multiplanar, multiecho pulse sequences of the brain and surrounding structures were obtained without intravenous contrast. Angiographic images of the head were obtained using MRA technique without contrast. COMPARISON:  CT head 08/23/2015, MRI 03/28/2010 FINDINGS:  MRI HEAD FINDINGS Acute infarct in the right posterior frontal lobe measuring approximately 10 x 25 mm. No other areas of acute infarct. Generalized atrophy.  Negative for hydrocephalus. Subcortical and deep white matter hyperintensities bilaterally have progressed since 2012 and are most consistent with chronic microvascular ischemia. Chronic ischemia in the right and left thalamus unchanged from the prior study. Negative for intracranial hemorrhage or fluid collection Negative for mass or edema.  No shift of the midline structures. Mucous retention cyst left maxillary sinus. No orbital lesion. Pituitary and skull base normal. Extensive degenerative change in the cervical spine with spinal stenosis. MRA HEAD FINDINGS Left vertebral dominant and patent to the basilar. 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 PICA patent bilaterally. Fenestration of the mid basilar. No significant basilar stenosis. Superior cerebellar and posterior cerebral arteries patent bilaterally. Internal carotid artery patent bilaterally. Anterior and middle cerebral arteries widely patent. Negative for cerebral aneurysm. IMPRESSION: Acute infarct right posterior frontal lobe Progression of chronic microvascular ischemic changes since 2012 Negative MRA circle of Willis. Electronically Signed   By: Franchot Gallo M.D.   On: 08/23/2015 17:30    12-lead ECG: sinus rhythm, rate 68 All prior EKG's in EPIC reviewed with no documented atrial fibrillation  Telemetry: sinus rhythm  Assessment and Plan:  1. Cryptogenic stroke The patient presents with cryptogenic stroke.  The patient has a TEE planned for this AM.  I spoke at length with the patient about monitoring for afib with an implantable loop recorder.  Risks, benefits, and alteratives to implantable loop recorder were discussed with the patient today.   At this time, the patient is very clear in their decision to proceed with implantable loop recorder.   Wound care was  reviewed with the patient (keep incision clean and dry for 3 days).  Wound check scheduled for 09/10/15 at 2:30PM.   Please call with questions.   Chanetta Marshall, NP 08/27/2015 10:12 AM   Addendum: pt decided against TEE and ILR this admission  and was discharged to home.   Chanetta Marshall, NP 08/27/2015 7:14 PM

## 2015-08-27 NOTE — Progress Notes (Signed)
Patient states he no longer wants TEE. Neurology notified, cardiology notified. Dr. Dillard Cannon notified.

## 2015-08-27 NOTE — Discharge Instructions (Signed)
Ischemic Stroke Treated Without Warfarin An ischemic stroke (cerebrovascular accident) is the sudden death of brain tissue. It is a medical emergency. An ischemic stroke can cause permanent loss of brain function. This can cause problems with different parts of your body. CAUSES An ischemic stroke is caused by a decrease of oxygen supply to an area of your brain. It is usually the result of a small blood clot (embolus) or collection of cholesterol or fat (plaque) that blocks blood flow in the brain. An ischemic stroke can also be caused by blocked or damaged carotid arteries. RISK FACTORS  High blood pressure (hypertension).  High cholesterol.  Diabetes mellitus.  Heart disease.  The buildup of plaque in the blood vessels (peripheral artery disease or atherosclerosis).  The buildup of plaque in the blood vessels that provide blood and oxygen to the brain (carotid artery stenosis).  An abnormal heart rhythm (atrial fibrillation).  Obesity.  Smoking cigarettes.  Taking oral contraceptives, especially in combination with using tobacco.  Physical inactivity.  A diet that is high in fats, salt (sodium), and calories.  Excessive alcohol use.  Use of illegal drugs, especially cocaine and methamphetamine.  Being African American.  Being over the age of 24 years.  Family history of stroke.  Previous history of blood clots, stroke, TIA (transient ischemic attack), or heart attack.  Sickle cell disease. SIGNS AND SYMPTOMS These symptoms usually develop suddenly, or you may notice them after waking up from sleep. Symptoms may include sudden:  Weakness or numbness in your face, arm, or leg, especially on one side of your body.  Confusion.  Trouble speaking (aphasia) or understanding speech.  Trouble seeing with one or both eyes.  Trouble walking or difficulty moving your arms or legs.  Dizziness.  Loss of balance or coordination.  Severe headache with no known cause.  The headache is often described as the worst headache ever experienced. DIAGNOSIS Your health care provider can often determine the presence or absence of an ischemic stroke based on your symptoms, history, and physical exam. CT (computed tomography) of the brain is usually performed to confirm the stroke, determine causes, and determine stroke severity. Other tests may be done to find the cause of the stroke. These tests may include:  ECG (electrocardiogram).  Continuous heart monitoring.  Echocardiogram.  Carotid ultrasound.  MRI.  A scan of the brain circulation.  Blood tests. TREATMENT It is very important to seek treatment at the first sign of stroke symptoms. Your health care provider may perform the following treatments within 6 hours of the onset of stroke symptoms:  Medicine to dissolve the blood clot (thrombolytic).  Inserting a device into the affected artery to remove the blood clot. These treatments may not be effective if too much time has passed since your stroke symptoms began. Even if you do not know when your symptoms began, get treatment as soon as possible. There are other treatment options that may be given, such as:  Oxygen.  IV fluids.  Medicines to thin the blood (anticoagulants).  A procedure to widen blocked arteries. Your treatment will depend on how long you have had your symptoms, the severity of your symptoms, and the cause of your symptoms. Your health care provider will take measures to prevent short-term and long-term complications of stroke, such as:  Breathing foreign material into the lungs (aspiration pneumonia).  Blood clots in the legs.  Bedsores.  Falls. Medicines and dietary changes may be used to help treat and manage risk factors for  stroke, such as diabetes and high blood pressure. If any of your body's functions were impaired by stroke, you may work with physical, speech, or occupational therapists to help you recover. HOME CARE  INSTRUCTIONS  Take medicines only as directed by your health care provider. Follow the directions carefully. Medicines may be used to control risk factors for a stroke. Be sure that you understand all your medicine instructions.  If swallow studies have determined that your swallowing reflex is present, you should eat healthy foods. Foods may need to be a soft or pureed consistency, or you may need to take small bites in order to avoid aspirating or choking.  Follow physical activity guidelines as directed by your health care team.  Do not use any tobacco products, including cigarettes, chewing tobacco, or electronic cigarettes. If you smoke, quit. If you need help quitting, ask your health care provider.  Limit or stop alcohol use.  A safe home environment is important to reduce the risk of falls. Your health care provider may arrange for specialists to evaluate your home. Having grab bars in the bedroom and bathroom is often important. Your health care provider may arrange for equipment to be used at home, such as raised toilets and a seat for the shower.  Ongoing physical, occupational, and speech therapy may be needed to maximize your recovery after a stroke. If you have been advised to use a walker or a cane, use it at all times. Be sure to keep your therapy appointments.  Keep all follow-up visits with your health care provider. This is very important. This includes any referrals, therapy, rehabilitation, and lab tests. Proper follow-up can prevent another stroke from occurring. PREVENTION The risk of a stroke can be decreased by appropriately treating high blood pressure, high cholesterol, diabetes, heart disease, and obesity. It can also be decreased by quitting smoking, limiting alcohol, and staying physically active. SEEK IMMEDIATE MEDICAL CARE IF:  You have sudden weakness or numbness in your face, arm, or leg, especially on one side of your body.  You have sudden confusion.  You  have sudden trouble speaking (aphasia) or understanding.  You have sudden trouble seeing with one or both eyes.  You have sudden trouble walking or difficulty moving your arms or legs.  You have sudden dizziness.  You have a sudden loss of balance or coordination.  You have a sudden, severe headache with no known cause.  You have a partial or total loss of consciousness. Any of these symptoms may represent a serious problem that is an emergency. Do not wait to see if the symptoms will go away. Get medical help right away. Call your local emergency services (911 in [place] Do not drive yourself to the hospital.   This information is not intended to replace advice given to you by your health care provider. Make sure you discuss any questions you have with your health care provider.   Document Released: 12/09/2013 Document Reviewed: 12/09/2013 Elsevier Interactive Patient Education 2016 [employer].  Cardiac Event Monitoring A cardiac event monitor is a small recording device used to help detect abnormal heart rhythms (arrhythmias). The monitor is used to record heart rhythm when noticeable symptoms such as the following occur:  Fast heartbeats (palpitations), such as heart racing or fluttering.  Dizziness.  Fainting or light-headedness.  Unexplained weakness. The monitor is wired to two electrodes placed on your chest. Electrodes are flat, sticky disks that attach to your skin. The monitor can be worn for up to  30 days. You will wear the monitor at all times, except when bathing.  HOW TO USE YOUR CARDIAC EVENT MONITOR A technician will prepare your chest for the electrode placement. The technician will show you how to place the electrodes, how to work the monitor, and how to replace the batteries. Take time to practice using the monitor before you leave the office. Make sure you understand how to send the information from the monitor to your health care provider. This requires a  telephone with a landline, not a cell phone. You need to:  Wear your monitor at all times, except when you are in water:  Do not get the monitor wet.  Take the monitor off when bathing. Do not swim or use a hot tub with it on.  Keep your skin clean. Do not put body lotion or moisturizer on your chest.  Change the electrodes daily or any time they stop sticking to your skin. You might need to use tape to keep them on.  It is possible that your skin under the electrodes could become irritated. To keep this from happening, try to put the electrodes in slightly different places on your chest. However, they must remain in the area under your left breast and in the upper right section of your chest.  Make sure the monitor is safely clipped to your clothing or in a location close to your body that your health care provider recommends.  Press the button to record when you feel symptoms of heart trouble, such as dizziness, weakness, light-headedness, palpitations, thumping, shortness of breath, unexplained weakness, or a fluttering or racing heart. The monitor is always on and records what happened slightly before you pressed the button, so do not worry about being too late to get good information.  Keep a diary of your activities, such as walking, doing chores, and taking medicine. It is especially important to note what you were doing when you pushed the button to record your symptoms. This will help your health care provider determine what might be contributing to your symptoms. The information stored in your monitor will be reviewed by your health care provider alongside your diary entries.  Send the recorded information as recommended by your health care provider. It is important to understand that it will take some time for your health care provider to process the results.  Change the batteries as recommended by your health care provider. SEEK IMMEDIATE MEDICAL CARE IF:   You have chest  pain.  You have extreme difficulty breathing or shortness of breath.  You develop a very fast heartbeat that persists.  You develop dizziness that does not go away.  You faint or constantly feel you are about to faint.   This information is not intended to replace advice given to you by your health care provider. Make sure you discuss any questions you have with your health care provider.   Document Released: 12/04/2007 Document Revised: 03/17/2014 Document Reviewed: 08/23/2012 Elsevier Interactive Patient Education [employer].

## 2015-08-27 NOTE — Care Management Note (Signed)
Case Management Note  Patient Details  Name: Peter Mann MRN: [id number] Date of Birth: [birth year]  Subjective/Objective:  Pt admitted with CVA. He is from home alone.                 Action/Plan: No f/u need per PT/OT. CM following for discharge needs.  Expected Discharge Date:                  Expected Discharge Plan:  Home/Self Care  In-House Referral:     Discharge planning Services     Post Acute Care Choice:    Choice offered to:     DME Arranged:    DME Agency:     HH Arranged:    HH Agency:     Status of Service:  In process, will continue to follow  Medicare Important Message Given:  Yes Date Medicare IM Given:    Medicare IM give by:    Date Additional Medicare IM Given:    Additional Medicare Important Message give by:     If discussed at [place] of Stay Meetings, dates discussed:    Additional Comments:  Pollie Friar, RN 08/27/2015, 10:38 AM

## 2015-08-27 NOTE — Discharge Summary (Signed)
Discharge Summary  Peter Mann [id number] DOB: Jul 02, 1940  PCP: Philis Fendt, MD  Admit date: 08/23/2015 Discharge date: 08/27/2015  Time spent: >71mins  Recommendations for Outpatient Follow-up:  1. F/u with PMD within a week  for hospital discharge follow up, repeat cbc/bmp at follow up, pmd to continue monitor blood pressure and blood sugar control 2. F/u with neurology stroke clinic Dr Lottie Rater in two months  3. F/u with cardiology for outpatient 30 day cardiac event monitoring   Discharge Diagnoses:  Active Hospital Problems   Diagnosis Date Noted  . Facial palsy   . Acute CVA (cerebrovascular accident) ([place])   . HLD (hyperlipidemia)   . Diabetes mellitus type 2, noninsulin dependent ([place])   . CVA (cerebral vascular accident) ([place]) 08/23/2015    Resolved Hospital Problems   Diagnosis Date Noted Date Resolved  No resolved problems to display.    Discharge Condition: stable  Diet recommendation: heart healthy/carb modified  Filed Weights   08/23/15 2216  Weight: 92.262 kg (203 lb 6.4 oz)    History of present illness:   Chief Complaint: tongue biting  History of Present Illness: [name] is a 75 yo male with HTN, HLD, DMII, and h/o TIA, presenting with 2 day h/o tongue and cheek biting when eating. He first noticed biting his tongue on Tuesday night. It has not improved since then. He has also noticed some slurred speech and that he cannot scrunch his nose on the left side. He denies weakness, LOC, or bowel/bladder incontinence. He reports he is supposed to be on ASA daily, but did not take it Monday-Wednesday of this week. He states that he sometimes does not take it for 3 months. He reports a h/o TIA, with symptoms relieved when he went back home and started smoking again. He denies history of stroke, seizures, MI, or CHF. He has a h/o HTN, DMII (last A1c in Epic of 8.4% in 2015), and HLD.   SHx: He is a current 1/4-1/3 ppd smoker (previously 3  ppd x 55 years). He previously drank extremely heavily, but stopped 25 years ago. He denies other drugs.  FHx: No family h/o DMII, CHF, or MI.  - Brother: stroke  In the ED, CT head demonstrated stable atrophy, chronic ischemic small vessel disease, and stable previous right thalamic lacunar infarct without evidence of new infarct.  Hospital Course:  Active Problems:   CVA (cerebral vascular accident) ([place])   Acute CVA (cerebrovascular accident) ([place])   HLD (hyperlipidemia)   Diabetes mellitus type 2, noninsulin dependent ([place])   Facial palsy   Acute CVA with dysarthria, left facial droop MRI/MRA: Acute infarct right posterior frontal lobe,  Progression of chronic microvascular ischemic changes since 2012 Negative MRA circle of Willis. Reported not taking asa as ordered prior to hospitalization, he is started on asa 325 and statin Stroke work up and management per neurology, appreciate neurology input TEE/loop recorder placement was scheduled on 6/20, however patient changed his minds, he declined procedure on 6/19, cardiology and neurology notified, he is cleared to discharge home with outpatient 30day cardiac event monitor.  Patient is discharged on increased dose of asa at 325mg  daily, he is continued on statin.   HTN:  Home bp meds norvasc and metop held to allow permissive htn for 24-48hrs, then gradually normalized bp -he is discharged on resume home meds norvasc and continue lopressor at 12.5bid, patient is advised to check his blood pressure at home and bring in his blood pressure record to  pmd office at follow up appointment for further blood pressure meds adjustment.  noninsulin dependent dm2, a1c 8.5, Home oral meds held in the hospital, he received a few doses of ssi in the hospital. Home oral meds resumed at discharge, continue diet control, pmd to monitor blood sugar control, goal of a1c less than 7.   Elevated cr , likely new baseline from uncontrolled dms and  htn Cr wnl in 2015, cr elevated (1.4) on admission, ua unremarkable, cr did not improved with hydration,  renal US no obstruction.    Chronic constipation: received enema, now on stool softener, tsh wnl  Code Status: full  Family Communication: patient   Disposition Plan: home with home health speech therapy on 6/19   Consultants:  neurology  Procedures:  TEE/loop recorder planned on  6/20 is cancelled due to patient's refusal   Antibiotics:  none   Discharge Exam: BP 150/78 mmHg  Pulse 67  Temp(Src) 98.7 F (37.1 C) (Oral)  Resp 18  Ht 5\' 6"  (1.676 m)  Wt 92.262 kg (203 lb 6.4 oz)  BMI 32.85 kg/m2  SpO2 99%   General: NAD  Cardiovascular: RRR  Respiratory: CTABL  Abdomen: Soft/ND/NT, positive BS  Musculoskeletal: No Edema  Neuro: left nasal labial flattening, left facial droop, less dysarthria, strength in all extremities equal wnl. aaox3   Discharge Instructions You were cared for by a hospitalist during your hospital stay. If you have any questions about your discharge medications or the care you received while you were in the hospital after you are discharged, you can call the unit and asked to speak with the hospitalist on call if the hospitalist that took care of you is not available. Once you are discharged, your primary care physician will handle any further medical issues. Please note that NO REFILLS for any discharge medications will be authorized once you are discharged, as it is imperative that you return to your primary care physician (or establish a relationship with a primary care physician if you do not have one) for your aftercare needs so that they can reassess your need for medications and monitor your lab values.      Discharge Instructions    Ambulatory referral to Neurology    Complete by:  As directed   Pt will follow up with Dr. Erlinda Hong at [facility] in about 2 months. Thanks.     Diet - low sodium heart healthy    Complete by:  As directed     Carb modified     Face-to-face encounter (required for Medicare/Medicaid patients)    Complete by:  As directed   I Antone Summons certify that this patient is under my care and that I, or a nurse practitioner or physician's assistant working with me, had a face-to-face encounter that meets the physician face-to-face encounter requirements with this patient on 08/27/2015. The encounter with the patient was in whole, or in part for the following medical condition(s) which is the primary reason for home health care (List medical condition): cva with dysarthria  The encounter with the patient was in whole, or in part, for the following medical condition, which is the primary reason for home health care:  cva with dysarthria  I certify that, based on my findings, the following services are medically necessary home health services:  Speech language pathology  Reason for Medically Necessary Home Health Services:  Other See Comments  My clinical findings support the need for the above services:  OTHER SEE COMMENTS  Further, I  certify that my clinical findings support that this patient is homebound due to:  Shortness of Breath with activity     Home Health    Complete by:  As directed   To provide the following care/treatments:  SLP     Increase activity slowly    Complete by:  As directed             Medication List    STOP taking these medications        aspirin EC 81 MG tablet  Replaced by:  aspirin 325 MG tablet      TAKE these medications        ALPRAZolam 0.25 MG tablet  Commonly known as:  XANAX  Take 0.25 mg by mouth daily.     amLODipine 10 MG tablet  Commonly known as:  NORVASC  Take 10 mg by mouth daily.     aspirin 325 MG tablet  Take 1 tablet (325 mg total) by mouth daily.     CALCIUM 600+D 600-800 MG-UNIT Tabs  Generic drug:  Calcium Carb-Cholecalciferol  Take 1 tablet by mouth 2 (two) times daily.     cycloSPORINE 0.05 % ophthalmic emulsion  Commonly known as:  RESTASIS   Place 1 drop into the left eye every 12 (twelve) hours.     DEXILANT 60 MG capsule  Generic drug:  dexlansoprazole  Take 60 mg by mouth daily.     ibuprofen 200 MG tablet  Commonly known as:  ADVIL,MOTRIN  Take 200 mg by mouth every 6 (six) hours as needed for moderate pain.     meclizine 25 MG tablet  Commonly known as:  ANTIVERT  Take 25 mg by mouth 2 (two) times daily as needed for dizziness.     metoprolol tartrate 25 MG tablet  Commonly known as:  LOPRESSOR  Take 0.5 tablets (12.5 mg total) by mouth 2 (two) times daily.  Start taking on:  08/28/2015     MILK OF MAGNESIA 400 MG/5ML suspension  Generic drug:  magnesium hydroxide  Take 60 mLs by mouth daily as needed for mild constipation.     ONGLYZA 5 MG Tabs tablet  Generic drug:  saxagliptin HCl  Take 5 mg by mouth daily.     polyethylene glycol packet  Commonly known as:  MIRALAX / GLYCOLAX  Take 17 g by mouth daily.     potassium chloride 10 MEQ tablet  Commonly known as:  K-DUR,KLOR-CON  Take 10 mEq by mouth daily.     rosuvastatin 20 MG tablet  Commonly known as:  CRESTOR  Take 20 mg by mouth daily.     tamsulosin 0.4 MG Caps capsule  Commonly known as:  FLOMAX  Take 0.4 mg by mouth daily.     tiZANidine 4 MG tablet  Commonly known as:  ZANAFLEX  Take 4 mg by mouth at bedtime.     traMADol 50 MG tablet  Commonly known as:  ULTRAM  Take 1 tablet (50 mg total) by mouth every 12 (twelve) hours as needed for moderate pain.       No Known Allergies Follow-up Information    Follow up with Philis Fendt, MD In 1 week.   Specialty:  Internal Medicine   Why:  hospital discharge follow up   Contact information:   [place] [address] [phone]       Follow up with [facility] On 09/10/2015.   Specialty:  Cardiology   Why:  at 2:30PM for wound check    Contact information:   [address] [place] [phone]       Follow up with Sahan Pen,Jindong, MD In 2 months.   Specialty:  Neurology   Why:  stroke clinic   Contact information:   [address] [place] [address] [phone]       Follow up with Philis Fendt, MD On 08/30/2015.   Specialty:  Internal Medicine   Why:  hospital discharge follow up appointment, repeat cbc/bmp at follow up, please bring in your blood pressure recording to your follow up appiontment for blood pressure monitoring. pmd to monitor blood sugar control.    Contact information:   [place] [address] [phone]        The results of significant diagnostics from this hospitalization (including imaging, microbiology, ancillary and laboratory) are listed below for reference.    Significant Diagnostic Studies: Ct Head Wo Contrast  08/23/2015  CLINICAL DATA:  Left facial numbness, drooping, and slurred speech. EXAM: CT HEAD WITHOUT CONTRAST TECHNIQUE: Contiguous axial images were obtained from the base of the skull through the vertex without intravenous contrast. COMPARISON:  CT of the head 01/31/2014 FINDINGS: Brain: No evidence of acute infarction, hemorrhage, extra-axial collection, ventriculomegaly, or mass effect. There is mild brain parenchymal atrophy. Stable changes from white matter microvascular ischemia are also noted. Previously-seen right thalamic lacunar infarct is stable. Vascular: No hyperdense vessel or unexpected calcification. Skull: Negative for fracture or focal lesion. Sinuses/Orbits: Polypoid mucosal thickening within the left maxillary sinus and less so right maxillary sinus is present. Other: None. IMPRESSION: No acute intracranial abnormality. Stable brain parenchymal atrophy and chronic microvascular disease. Stable appearance of right thalamic lacunar infarct. Bilateral maxillary sinus disease which may represent mucous retention cysts and/or chronic sinusitis. Electronically Signed   By: Fidela Salisbury M.D.    On: 08/23/2015 11:30   Mr Jodene Nam Head Wo Contrast  08/23/2015  CLINICAL DATA:  Dysarthria and left facial droop 3 days ago EXAM: MRI HEAD WITHOUT CONTRAST MRA HEAD WITHOUT CONTRAST TECHNIQUE: Multiplanar, multiecho pulse sequences of the brain and surrounding structures were obtained without intravenous contrast. Angiographic images of the head were obtained using MRA technique without contrast. COMPARISON:  CT head 08/23/2015, MRI 03/28/2010 FINDINGS: MRI HEAD FINDINGS Acute infarct in the right posterior frontal lobe measuring approximately 10 x 25 mm. No other areas of acute infarct. Generalized atrophy.  Negative for hydrocephalus. Subcortical and deep white matter hyperintensities bilaterally have progressed since 2012 and are most consistent with chronic microvascular ischemia. Chronic ischemia in the right and left thalamus unchanged from the prior study. Negative for intracranial hemorrhage or fluid collection Negative for mass or edema.  No shift of the midline structures. Mucous retention cyst left maxillary sinus. No orbital lesion. Pituitary and skull base normal. Extensive degenerative change in the cervical spine with spinal stenosis. MRA HEAD FINDINGS Left vertebral dominant and patent to the basilar. 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 PICA patent bilaterally. Fenestration of the mid basilar. No significant basilar stenosis. Superior cerebellar and posterior cerebral arteries patent bilaterally. Internal carotid artery patent bilaterally. Anterior and middle cerebral arteries widely patent. Negative for cerebral aneurysm. IMPRESSION: Acute infarct right posterior frontal lobe Progression of chronic microvascular ischemic changes since 2012 Negative MRA circle of Willis. Electronically Signed   By: Franchot Gallo M.D.   On: 08/23/2015 17:30   Mr [name] Contrast  08/23/2015  CLINICAL  DATA:  Dysarthria and left facial droop 3 days ago EXAM: MRI HEAD WITHOUT CONTRAST MRA HEAD  WITHOUT CONTRAST TECHNIQUE: Multiplanar, multiecho pulse sequences of the brain and surrounding structures were obtained without intravenous contrast. Angiographic images of the head were obtained using MRA technique without contrast. COMPARISON:  CT head 08/23/2015, MRI 03/28/2010 FINDINGS: MRI HEAD FINDINGS Acute infarct in the right posterior frontal lobe measuring approximately 10 x 25 mm. No other areas of acute infarct. Generalized atrophy.  Negative for hydrocephalus. Subcortical and deep white matter hyperintensities bilaterally have progressed since 2012 and are most consistent with chronic microvascular ischemia. Chronic ischemia in the right and left thalamus unchanged from the prior study. Negative for intracranial hemorrhage or fluid collection Negative for mass or edema.  No shift of the midline structures. Mucous retention cyst left maxillary sinus. No orbital lesion. Pituitary and skull base normal. Extensive degenerative change in the cervical spine with spinal stenosis. MRA HEAD FINDINGS Left vertebral dominant and patent to the basilar. 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 PICA patent bilaterally. Fenestration of the mid basilar. No significant basilar stenosis. Superior cerebellar and posterior cerebral arteries patent bilaterally. Internal carotid artery patent bilaterally. Anterior and middle cerebral arteries widely patent. Negative for cerebral aneurysm. IMPRESSION: Acute infarct right posterior frontal lobe Progression of chronic microvascular ischemic changes since 2012 Negative MRA circle of Willis. Electronically Signed   By: Franchot Gallo M.D.   On: 08/23/2015 17:30   US Renal  08/27/2015  CLINICAL DATA:  Worsening renal function. Elevated creatinine. Hypertension and diabetes. EXAM: RENAL / URINARY TRACT ULTRASOUND COMPLETE COMPARISON:  CT 05/06/2015 FINDINGS: Right Kidney: Length: 10.9 cm. Echogenicity within normal limits. No mass or hydronephrosis visualized.  Left Kidney: Length: 11.3 cm. Echogenicity within normal limits. No mass or hydronephrosis visualized. Bladder: Appears normal for degree of bladder distention. IMPRESSION: Normal renal ultrasound. 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 Electronically Signed   By: Nelson Chimes M.D.   On: 08/27/2015 11:56    Microbiology: No results found for this or any previous visit (from the past 240 hour(s)).   Labs: Basic Metabolic Panel:  Recent Labs Lab 08/23/15 1033 08/23/15 1055 08/24/15 0724 08/25/15 0540 08/26/15 0448 08/27/15 0429  NA 138 141 139 139 140 138  K 4.4 4.6 3.8 3.8 4.0 3.4*  CL 107 102 105 105 104 106  CO2 26  --  28 26 28 26   GLUCOSE 157* 148* 128* 126* 118* 130*  BUN 10 12 9 11 14 17   CREATININE 1.39* 1.40* 1.29* 1.27* 1.33* 1.47*  CALCIUM 9.0  --  9.0 9.2 9.4 8.7*  MG  --   --   --   --  2.1  --    Liver Function Tests:  Recent Labs Lab 08/23/15 1033  AST 18  ALT 15*  ALKPHOS 88  BILITOT 0.5  PROT 6.7  ALBUMIN 3.9   No results for input(s): LIPASE, AMYLASE in the last 168 hours. No results for input(s): AMMONIA in the last 168 hours. CBC:  Recent Labs Lab 08/23/15 1033 08/23/15 1055 08/23/15 1739 08/25/15 0540 08/26/15 0448 08/27/15 0429  WBC 3.8*  --  3.6* 3.2* 3.5* 3.9*  NEUTROABS 2.5  --   --   --   --   --   HGB 12.8* 14.3 13.1 12.7* 12.7* 12.0*  HCT 40.6 42.0 41.4 40.0 39.9 37.6*  MCV 84.9  --  83.6 83.0 82.6 82.6  PLT 179  --  187 191 190 173   Cardiac Enzymes: No results for input(s): CKTOTAL, CKMB, CKMBINDEX, TROPONINI in the last 168 hours. BNP: BNP (last 3 results) No results for input(s): BNP in the last 8760 hours.  ProBNP (last 3 results) No results for input(s): PROBNP in the last 8760 hours.  CBG:  Recent Labs Lab 08/26/15 1136 08/26/15 1627 08/26/15 2131 08/27/15 0608 08/27/15 1215  GLUCAP 161* 124* 136* 119* 107*       Signed:  Bevin Mayall MD, PhD  [facility] 08/27/2015, 4:40 PM

## 2015-08-27 NOTE — Progress Notes (Signed)
    [facility] has been requested to perform a transesophageal echocardiogram on 08/28/2015 for CVA.  After careful review of history and examination, the risks and benefits of transesophageal echocardiogram have been explained including risks of esophageal damage, perforation (1:10,000 risk), bleeding, pharyngeal hematoma as well as other potential complications associated with conscious sedation including aspiration, arrhythmia, respiratory failure and death. Alternatives to treatment were discussed, questions were answered. Patient is willing to proceed.   I have reviewed the patients labs, Hgb 12.0 and platelet count 173,000. His blood pressure has been stable, without the need for pressers.   Reino Bellis, NP-C 08/27/2015 10:54 AM

## 2015-08-27 NOTE — Progress Notes (Addendum)
Speech Language Pathology Treatment: Cognitive-Linquistic  Patient Details Name: Peter Mann MRN: [id number] DOB: 03-Dec-1940 Today's Date: 08/27/2015 Time: [id number] SLP Time Calculation (min) (ACUTE ONLY): 12 min  Assessment / Plan / Recommendation Clinical Impression  Cognitive treatment provided today to review memory compensatory strategies with pt. Pt again stated having difficulties with short-term memory which has been ongoing. Able to recall that MD recommended for pt to begin taking aspirin in addition to other medications. Pt reports having a friend who lives a block away who is available to help as needed. Reviewed simple compensatory strategies (use of pill box, use of calendar). Pt described system at home to keep track of medications- arranging them in rows to keep track of morning and evening meds. Pt does not use calendar to keep track of appointments and admitted occasional difficulties keeping track of important dates. Pt continues to have L side droop/ weakness but reports speech sounds "back to normal"- reports that people have always had a difficult time understanding him. Plan is for pt to discharge today. While baseline level of functioning is unclear, pt would benefit from continued speech therapy services at next level of care to provide continued review of memory/ speech intelligibility strategies for pt safety and quality of life. Pt is in agreement with recommendations.   HPI HPI: [name] is a 75 yo male with HTN, HLD, DMII, tobacco abuse and  TIA, presenting with 2 day h/o tongue and cheek biting when eating, slurred speech and decreased volitional movement with facial muscles. CT head demonstrated stable atrophy, chronic ischemic small vessel disease, and stable remote right thalamic lacunar infarct without evidence of new infarct. MRI pending.      SLP Plan  Continue with current plan of care     Recommendations                Follow up Recommendations: Home health  SLP Plan: Continue with current plan of care     [place], Amy K, [place], CCC-SLP 08/27/2015, 4:35 PM  [phone]

## 2015-08-27 NOTE — Care Management Important Message (Signed)
Important Message  Patient Details  Name: Peter Mann MRN: [id number] Date of Birth: July 05, 1940   Medicare Important Message Given:  Yes    Loann Quill 08/27/2015, 10:21 AM

## 2015-08-27 NOTE — Progress Notes (Signed)
STROKE TEAM PROGRESS NOTE   SUBJECTIVE (INTERVAL HISTORY) No family at bedside. He was upset that he can not have TEE today. He is ambulating in the hallway without problem. He ate well this am. No acute event overnight. OK to stay for one more day. He has a lot of bill to pay at home and deadline is today.   OBJECTIVE Temp:  [97.6 F (36.4 C)-98.9 F (37.2 C)] 98.9 F (37.2 C) (06/19 1020) Pulse Rate:  [63-86] 71 (06/19 1020) Cardiac Rhythm:  [-] Normal sinus rhythm (06/19 0700) Resp:  [16-20] 20 (06/19 1020) BP: (123-161)/(65-89) 123/87 mmHg (06/19 1020) SpO2:  [97 %-100 %] 98 % (06/19 1020)  CBC:  Recent Labs Lab 08/23/15 1033  08/26/15 0448 08/27/15 0429  WBC 3.8*  < > 3.5* 3.9*  NEUTROABS 2.5  --   --   --   HGB 12.8*  < > 12.7* 12.0*  HCT 40.6  < > 39.9 37.6*  MCV 84.9  < > 82.6 82.6  PLT 179  < > 190 173  < > = values in this interval not displayed.  Basic Metabolic Panel:   Recent Labs Lab 08/26/15 0448 08/27/15 0429  NA 140 138  K 4.0 3.4*  CL 104 106  CO2 28 26  GLUCOSE 118* 130*  BUN 14 17  CREATININE 1.33* 1.47*  CALCIUM 9.4 8.7*  MG 2.1  --     Lipid Panel:     Component Value Date/Time   CHOL 88 08/25/2015 0540   TRIG 135 08/25/2015 0540   HDL 24* 08/25/2015 0540   CHOLHDL 3.7 08/25/2015 0540   VLDL 27 08/25/2015 0540   LDLCALC 37 08/25/2015 0540   HgbA1c:  Lab Results  Component Value Date   HGBA1C 8.5* 08/23/2015   Urine Drug Screen:     Component Value Date/Time   LABOPIA NONE DETECTED 08/24/2015 1854   COCAINSCRNUR NONE DETECTED 08/24/2015 1854   LABBENZ NONE DETECTED 08/24/2015 1854   AMPHETMU NONE DETECTED 08/24/2015 1854   THCU NONE DETECTED 08/24/2015 1854   LABBARB NONE DETECTED 08/24/2015 1854      IMAGING I have personally reviewed the radiological images below and agree with the radiology interpretations.  Ct Head Wo Contrast 08/23/2015   No acute intracranial abnormality. Stable brain parenchymal atrophy and  chronic microvascular disease. Stable appearance of right thalamic lacunar infarct. Bilateral maxillary sinus disease which may represent mucous retention cysts and/or chronic sinusitis.   Mri & Mra Head Wo Contrast 08/23/2015   Acute infarct right posterior frontal lobe. Progression of chronic microvascular ischemic changes since 2012 Negative MRA circle of Willis.   Carotid Duplex  Mild plaque in bilateral internal carotid arteries with no evidence of a significant stenosis - 1-39%. Vertebral arteries demonstrate antegrade flow.  2-D echocardiogram 08/24/2015 - Left ventricle: The cavity size was normal. Wall thickness was increased in a pattern of moderate LVH. Systolic function was normal. The estimated ejection fraction was in the range of 55% to 60%. Wall motion was normal; there were no regional wall motion abnormalities. Doppler parameters are consistent with abnormal left ventricular relaxation (grade 1 diastolic dysfunction). - Aortic valve: There was mild regurgitation. - Pulmonary arteries: PA peak pressure: 36 mm Hg (S). - Pericardium, extracardiac: A trivial pericardial effusion was identified.  LE venous doppler - negative for DVT  TEE pending   PHYSICAL EXAM Elderly african [place] male not in distress. . Afebrile. Head is nontraumatic. Neck is supple without bruit. Cardiac exam no murmur  or gallop. Lungs are clear to auscultation. Distal pulses are well felt. Neurological Exam :  Awake alert oriented 3. Mild dysarthria. No aphasia. Extraocular moments are full range without nystagmus. Fundi were not visualized. Vision acuity seems adequate. Visual fields are full to bedside confrontational testing. Pupils equal reactive. Moderate left lower facial weakness. Tongue midline. Motor system exam reveals no upper or lower extremity drift. Symmetric and equal strength in all 4 extremities. Sensation is intact bilaterally. Gait was not tested due to safety concerns. Deep tendon  reflexes are 1+ symmetric. Plantars are downgoing.   ASSESSMENT/PLAN [name] is a 75 y.o. male with history of HTN, HLD, DMII, and h/o TIA presenting with dysarthria and L facial droop x 3 days. He did not receive IV t-PA due to delay in arrival.   Stroke:  Right frontal lobe infarct, embolic secondary to unknown source  Resultant - left facial droop  MRI  Right frontal lobe infarct   MRA  Unremarkable   Carotid Doppler  No significant stenosis   2D Echo  no cardiac source of emboli identified.  Need TEE and loop to complete embolic workup - scheduled for Tuesday (schedule full Monday). Cards aware.  EP consult in place  LDL 37  HgbA1c 8.5  Lovenox 40 mg sq daily for VTE prophylaxis Diet NPO time specified Except for: Sips with Meds Diet Carb Modified Fluid consistency:: Thin; Room service appropriate?: Yes - Tuesday for TEE.  aspirin 81 mg daily prior to admission, now on aspirin 325 mg daily.   Patient counseled to be compliant with his antithrombotic medications  Ongoing aggressive stroke risk factor management  Therapy recommendations:  No PT or OT follow-up recommended. Needs intermittent supervision.   Disposition:  pending   Hypertension  Stable  Permissive hypertension (OK if < 220/120) but gradually normalize in 5-7 days  Long-term BP goal normotensive  Hyperlipidemia  Home meds:  Crestor 20, resumed in hospital  LDL 37 , goal < 70  Continue statin at discharge  Diabetes  HgbA1c 8.5, goal < 7.0  DM not in good control  SSI  Management as per primary team  Tobacco abuse  Current smoker  Smoking cessation counseling provided  Pt is willing to quit  Other Stroke Risk Factors  Advanced age  Hx ETOH abuse, stopped 72 y ago  Obesity, Body mass index is 32.85 kg/(m^2)., recommend weight loss, diet and exercise as appropriate   Hx TIA per pt  Family hx stroke (brother)  Other Active  Problems  BPH  Pain/anxiety  Creatinine 1.27 -> 1.33  Mild leukopenia - 3.2 -> 3.5  Hospital day # 4  Rosalin Hawking, MD PhD Stroke Neurology 08/27/2015 10:44 AM    To contact Stroke Continuity provider, please refer to [email or web address] After hours, contact General Neurology

## 2015-08-27 NOTE — Progress Notes (Addendum)
Discharge orders received. Patient's neuro assessment unchanged. RN discussed discharge instructions, patient vocalized understanding of medications (taking metoprolol twice daily, starting tomorrow; aspirin once daily, miralax). Vocalized understanding of follow up appointments with pcp, neurology, cardiology. Left message for CM r/t home health slp. Patient states he is driving himself home in his car, MD notified, MD stated this was okay for patient to drive home alone. IV removed, tele removed. Walked to entrance with nurse tech.   Patient has ambulated multiple times around unit with no issue.

## 2015-08-27 NOTE — Plan of Care (Addendum)
Got call from nurse that pt does not want TEE anymore. If this is the case, we would recommend 30 day cardiac event monitoring as outpt. We will see him in our clinic for follow up.   Neurology will sign off. Please call with questions. Pt will follow up with Dr. Erlinda Hong at [facility] in about 2 months. Thanks for the consult.   Rosalin Hawking, MD PhD Stroke Neurology 08/27/2015 3:50 PM

## 2015-08-28 SURGERY — ECHOCARDIOGRAM, TRANSESOPHAGEAL
Anesthesia: Moderate Sedation

## 2015-08-28 SURGERY — LOOP RECORDER INSERTION
Anesthesia: LOCAL

## 2015-08-28 NOTE — Progress Notes (Signed)
Late entry: md order for home speech therapy. Spoke w pt and friend ms Truman Hayward [phone] and went over hhc agency list by phone. Pt was dc on 6-19. No pref to hhc agency. Ref to [facility]na f w [facility] home[facility] for [facility] speech therapy. [facility] will contact pt and set up hhsp there.

## 2015-09-07 ENCOUNTER — Encounter: Payer: Self-pay | Admitting: Cardiovascular Disease

## 2015-09-07 ENCOUNTER — Telehealth: Payer: Self-pay | Admitting: Cardiovascular Disease

## 2015-09-10 ENCOUNTER — Ambulatory Visit: Payer: Self-pay

## 2015-09-10 ENCOUNTER — Telehealth: Payer: Self-pay | Admitting: Cardiovascular Disease

## 2015-09-10 NOTE — Telephone Encounter (Signed)
Received records from [facility] for appointment on 10/10/15 with Dr Gwenlyn Found.  Records given to [facility] (medical records) for Dr Kennon Holter schedule on 10/10/15. lp

## 2015-09-12 NOTE — Telephone Encounter (Signed)
Closed encounter °

## 2015-09-13 ENCOUNTER — Ambulatory Visit: Payer: Medicare Other | Attending: Internal Medicine

## 2015-09-13 DIAGNOSIS — R471 Dysarthria and anarthria: Secondary | ICD-10-CM | POA: Insufficient documentation

## 2015-09-13 NOTE — Therapy (Signed)
[facility] [address] [place], [place], [address] Phone: [phone]   Fax:  [phone]  Patient Details  Name: Peter Mann MRN: [id number] Date of Birth: 1940/07/14 Referring Provider:  Nolene Ebbs, MD  Encounter Date: 09/13/2015  Thirty minutes into the evaluation today it was learned HHST was initiated last week and he is currently being treated by [facility]. Evaluation was stopped at that time.  When [facility] discharges, pt may return to this center, if therapy still necessary.   Please re-order ST evaluation via EPIC after HHST is discharged, if deemed needed.  [facility] ,[place], [place]  09/13/2015, 4:04 PM  [facility] [address] [place], [place], [address] Phone: [phone]   Fax:  [phone]

## 2015-09-14 ENCOUNTER — Ambulatory Visit: Payer: Medicare Other | Admitting: Rehabilitation

## 2015-09-28 ENCOUNTER — Telehealth: Payer: Self-pay | Admitting: Cardiovascular Disease

## 2015-10-09 ENCOUNTER — Ambulatory Visit (INDEPENDENT_AMBULATORY_CARE_PROVIDER_SITE_OTHER): Payer: Medicare Other | Admitting: Cardiovascular Disease

## 2015-10-09 ENCOUNTER — Encounter: Payer: Self-pay | Admitting: Cardiovascular Disease

## 2015-10-09 DIAGNOSIS — I10 Essential (primary) hypertension: Secondary | ICD-10-CM

## 2015-10-09 DIAGNOSIS — E785 Hyperlipidemia, unspecified: Secondary | ICD-10-CM | POA: Diagnosis not present

## 2015-10-09 DIAGNOSIS — I639 Cerebral infarction, unspecified: Secondary | ICD-10-CM | POA: Diagnosis not present

## 2015-10-09 NOTE — Assessment & Plan Note (Signed)
History of hyperlipidemia on statin therapy with recent lipid profile performed 08/25/15 revealed total cholesterol 88, LDL 37 and HDL of 24

## 2015-10-09 NOTE — Assessment & Plan Note (Signed)
History of recent admission for acute stroke 08/23/15. His MR I MRA showed acute infarct of the right posterior frontal lobe. His carotid Dopplers were negative. The intent was to perform TEE and loop recorder implantation however the patient left before these could be performed. There was mention of a 30 day event monitor as well. He did increase his aspirin to full dose. I do not think he is visibly able to manage" recorder at this point do not feel that he would benefit from either TEE or loop recorder implantation.

## 2015-10-09 NOTE — Assessment & Plan Note (Signed)
History of hypertension blood pressure measured at 120/70. He is on amlodipine, and metoprolol. Continue current meds at current dosing

## 2015-10-09 NOTE — Patient Instructions (Signed)

## 2015-10-09 NOTE — Progress Notes (Signed)
10/09/2015 Peter Mann   1940/10/01  [id number]  Primary Physician Philis Fendt, MD Primary Cardiologist: Lorretta Harp MD Renae Gloss  HPI:  [name] is a 75 year old mildly overweight separated African American male father of 2 children referred by Dr. Pierce Crane for cardiovascular evaluation because of chest pain. I last saw him in the office 02/01/13. He is retired from working [employer]. His cardiac risk factor profile is positive for 100-pack-year history of tobacco abuse still continuing to smoke off and on. History of hypertension and dyslipidemia. He also has a history of prostate cancer. Because of chest pain several years ago he underwent Myoview stress testing which was normal at that time (03/11/13). He was recently admitted with a stroke 08/23/15. CT scan showed acute infarct of the right posterior frontal lobe. His aspirin was increased to full dose and he was scheduled for a TEE and loop recorder implantation however he left prior to having this performed. I also discussed a 30 day event monitor which was never ordered either.   Current Outpatient Prescriptions  Medication Sig Dispense Refill  . ALPRAZolam (XANAX) 0.25 MG tablet Take 0.25 mg by mouth daily.     Marland Kitchen amLODipine (NORVASC) 10 MG tablet Take 10 mg by mouth daily.    Marland Kitchen aspirin 325 MG tablet Take 1 tablet (325 mg total) by mouth daily. 30 tablet 0  . Calcium Carb-Cholecalciferol (CALCIUM 600+D) 600-800 MG-UNIT TABS Take 1 tablet by mouth 2 (two) times daily.    . cycloSPORINE (RESTASIS) 0.05 % ophthalmic emulsion Place 1 drop into the left eye every 12 (twelve) hours.    Marland Kitchen dexlansoprazole (DEXILANT) 60 MG capsule Take 60 mg by mouth daily.    Marland Kitchen ibuprofen (ADVIL,MOTRIN) 200 MG tablet Take 200 mg by mouth every 6 (six) hours as needed for moderate pain.     . magnesium hydroxide (MILK OF MAGNESIA) 400 MG/5ML suspension Take 60 mLs by mouth daily as needed for mild constipation.     .  meclizine (ANTIVERT) 25 MG tablet Take 25 mg by mouth 2 (two) times daily as needed for dizziness.     . metoprolol tartrate (LOPRESSOR) 25 MG tablet Take 0.5 tablets (12.5 mg total) by mouth 2 (two) times daily. 60 tablet 0  . polyethylene glycol (MIRALAX / GLYCOLAX) packet Take 17 g by mouth daily. 14 each 0  . potassium chloride (K-DUR,KLOR-CON) 10 MEQ tablet Take 10 mEq by mouth daily.    . rosuvastatin (CRESTOR) 20 MG tablet Take 20 mg by mouth daily.    . saxagliptin HCl (ONGLYZA) 5 MG TABS tablet Take 5 mg by mouth daily.    . tamsulosin (FLOMAX) 0.4 MG CAPS capsule Take 0.4 mg by mouth daily.     Marland Kitchen tiZANidine (ZANAFLEX) 4 MG tablet Take 4 mg by mouth at bedtime.     . traMADol (ULTRAM) 50 MG tablet Take 1 tablet (50 mg total) by mouth every 12 (twelve) hours as needed for moderate pain. (Patient taking differently: Take 50 mg by mouth at bedtime. ) 60 tablet 0   No current facility-administered medications for this visit.     No Known Allergies  Social History   Social History  . Marital status: Legally Separated    Spouse name: N/A  . Number of children: N/A  . Years of education: N/A   Occupational History  . Not on file.   Social History Main Topics  . Smoking status: Current Some  Day Smoker    Last attempt to quit: 02/01/2010  . Smokeless tobacco: Never Used     Comment: 1/3-1/4 pack for 55 years though quite for some time  . Alcohol use No     Comment: quit 25 years ago though before "drank so much that you couldn't tell b/t alcohol and wine"  . Drug use: No  . Sexual activity: No   Other Topics Concern  . Not on file   Social History Narrative  . No narrative on file     Review of Systems: General: negative for chills, fever, night sweats or weight changes.  Cardiovascular: negative for chest pain, dyspnea on exertion, edema, orthopnea, palpitations, paroxysmal nocturnal dyspnea or shortness of breath Dermatological: negative for rash Respiratory: negative  for cough or wheezing Urologic: negative for hematuria Abdominal: negative for nausea, vomiting, diarrhea, bright red blood per rectum, melena, or hematemesis Neurologic: negative for visual changes, syncope, or dizziness All other systems reviewed and are otherwise negative except as noted above.    Blood pressure 120/70, pulse (!) 49, height 5\' 7"  (1.702 m), weight 196 lb (88.9 kg).  General appearance: alert and no distress Neck: no adenopathy, no carotid bruit, no JVD, supple, symmetrical, trachea midline and thyroid not enlarged, symmetric, no tenderness/mass/nodules Lungs: clear to auscultation bilaterally Heart: regular rate and rhythm, S1, S2 normal, no murmur, click, rub or gallop Extremities: extremities normal, atraumatic, no cyanosis or edema  EKG not performed today  ASSESSMENT AND PLAN:   Essential hypertension History of hypertension blood pressure measured at 120/70. He is on amlodipine, and metoprolol. Continue current meds at current dosing  Hyperlipidemia History of hyperlipidemia on statin therapy with recent lipid profile performed 08/25/15 revealed total cholesterol 88, LDL 37 and HDL of 24  Acute CVA (cerebrovascular accident) ([place]) History of recent admission for acute stroke 08/23/15. His MR I MRA showed acute infarct of the right posterior frontal lobe. His carotid Dopplers were negative. The intent was to perform TEE and loop recorder implantation however the patient left before these could be performed. There was mention of a 30 day event monitor as well. He did increase his aspirin to full dose. I do not think he is visibly able to manage" recorder at this point do not feel that he would benefit from either TEE or loop recorder implantation.      Lorretta Harp MD FACP,FACC,FAHA, [facility] 10/09/2015 3:45 PM

## 2015-10-10 ENCOUNTER — Ambulatory Visit: Payer: Medicare Other | Admitting: Cardiovascular Disease

## 2015-10-10 ENCOUNTER — Other Ambulatory Visit: Payer: Self-pay | Admitting: Neurology

## 2015-10-10 DIAGNOSIS — I639 Cerebral infarction, unspecified: Secondary | ICD-10-CM

## 2015-10-11 NOTE — Telephone Encounter (Signed)
Closed encounter °

## 2015-10-16 ENCOUNTER — Other Ambulatory Visit: Payer: Self-pay | Admitting: Neurology

## 2015-10-16 DIAGNOSIS — I4891 Unspecified atrial fibrillation: Secondary | ICD-10-CM

## 2015-10-16 DIAGNOSIS — I639 Cerebral infarction, unspecified: Secondary | ICD-10-CM

## 2015-10-17 ENCOUNTER — Ambulatory Visit (INDEPENDENT_AMBULATORY_CARE_PROVIDER_SITE_OTHER): Payer: Medicare Other

## 2015-10-17 DIAGNOSIS — I4891 Unspecified atrial fibrillation: Secondary | ICD-10-CM | POA: Diagnosis not present

## 2015-10-17 DIAGNOSIS — I639 Cerebral infarction, unspecified: Secondary | ICD-10-CM | POA: Diagnosis not present

## 2015-10-23 ENCOUNTER — Other Ambulatory Visit: Payer: Self-pay | Admitting: Radiation Oncology

## 2015-10-30 ENCOUNTER — Ambulatory Visit (INDEPENDENT_AMBULATORY_CARE_PROVIDER_SITE_OTHER): Payer: Medicare Other | Admitting: Neurology

## 2015-10-30 ENCOUNTER — Encounter: Payer: Self-pay | Admitting: Neurology

## 2015-10-30 VITALS — BP 139/78 | HR 52 | Ht 67.0 in | Wt 200.8 lb

## 2015-10-30 DIAGNOSIS — R739 Hyperglycemia, unspecified: Secondary | ICD-10-CM | POA: Diagnosis not present

## 2015-10-30 DIAGNOSIS — E119 Type 2 diabetes mellitus without complications: Secondary | ICD-10-CM | POA: Diagnosis not present

## 2015-10-30 DIAGNOSIS — I63411 Cerebral infarction due to embolism of right middle cerebral artery: Secondary | ICD-10-CM

## 2015-10-30 DIAGNOSIS — I10 Essential (primary) hypertension: Secondary | ICD-10-CM

## 2015-10-30 NOTE — Patient Instructions (Signed)
-   continue ASA and crestor for stroke prevention - use tylenol for pain and limit ibuprofen use - continue cardiac event monitoring - you declined the research study - will do TCD emboli detection - Follow up with your primary care physician for stroke risk factor modification. Recommend maintain blood pressure goal <130/80, diabetes with hemoglobin A1c goal below 7.0% and lipids with LDL cholesterol goal below 70 mg/dL.  - check BP and glucose at home and record - follow up in 4  Months.

## 2015-10-30 NOTE — Progress Notes (Signed)
STROKE NEUROLOGY FOLLOW UP NOTE  NAME: Peter Mann DOB: 03-09-1941  REASON FOR VISIT: stroke follow up HISTORY FROM: pt and chart  Today we had the pleasure of seeing Graylen Pollett in follow-up at our Neurology Clinic. Pt was accompanied by no one.   History Summary Mr. Dragan Jaurequi is a 75 y.o. male with history of HTN, HLD, DMII, and h/o ? stroke with left hand mild dexterity difficulty admitted on 08/23/15 for dysarthria and L facial droop x 3 days.  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 Other stroke workup including MRA, carotid Doppler, 2-D echo and DVT were all negative. Schedule TEE/loop the patient declined. LDL 37 and A1c 8.5. Patient aspirin 81 was changed to aspirin 325. Continued on Crestor. Discharged home in good condition.  Interval History During the interval time, the patient has been doing well. No recurrent stroke like symptoms. Still has mild left facial droop. On aspirin and Crestor. However, still taking ibuprofen every day for pain. 30 day monitoring ongoing at this moment. He has not quit smoking yet, currently taking 4 cigarettes every day. She stated that he is glucose at home controlled well, around 90s. Blood pressure today 139/78.  REVIEW OF SYSTEMS: Full 14 system review of systems performed and notable only for those listed below and in HPI above, all others are negative:  Constitutional:   Cardiovascular:  Ear/Nose/Throat:  Ringing in ears Skin:  Eyes:   Respiratory:   Gastroitestinal:  Diarrhea, constipation Genitourinary:  Hematology/Lymphatic:   Endocrine:  Musculoskeletal:  Cramps Allergy/Immunology:   Neurological:  Dizziness Psychiatric:  Sleep:   The following represents the patient's updated allergies and side effects list: No Known Allergies  The neurologically relevant items on the patient's problem list were reviewed on today's visit.  Neurologic Examination  A problem focused neurological exam (12 or more points of the  single system neurologic examination, vital signs counts as 1 point, cranial nerves count for 8 points) was performed.  Blood pressure 139/78, pulse (!) 52, height 5\' 7"  (1.702 m), weight 200 lb 12.8 oz (91.1 kg).  General - Well nourished, well developed, in no apparent distress.  Ophthalmologic - Fundi not visualized due to noncooperation.  Cardiovascular - Regular rate and rhythm with no murmur.  Mental Status -  Level of arousal and orientation to time, place, and person were intact. Language including expression, naming, comprehension was assessed and found intact. However, moderate dysarthria and difficulty repeating complicated sentences. Fund of Knowledge was assessed and was impaired.  Cranial Nerves II - XII - II - Visual field intact OU. III, IV, VI - Extraocular movements intact. V - Facial sensation intact bilaterally. VII - mild left facial droop. VIII - Hearing & vestibular intact bilaterally. X - Palate elevates symmetrically. XI - Chin turning & shoulder shrug intact bilaterally. XII - Tongue protrusion intact.  Motor Strength - The patient's strength was normal in all extremities except left hand dexterity difficulty which is old as per patient and pronator drift was absent.  Bulk was normal and fasciculations were absent.   Motor Tone - Muscle tone was assessed at the neck and appendages and was normal.  Reflexes - The patient's reflexes were 1+ in all extremities and he had no pathological reflexes.  Sensory - Light touch, temperature/pinpric were assessed and were normal.    Coordination - The patient had normal movements in the hands and feet with no ataxia or dysmetria.  Tremor was absent.  Gait and Station - small stride,  mild stooped posture, and mild shuffling gait.   Functional score  mRS = 1   0 - No symptoms.   1 - No significant disability. Able to carry out all usual activities, despite some symptoms.   2 - Slight disability. Able to look  after own affairs without assistance, but unable to carry out all previous activities.   3 - Moderate disability. Requires some help, but able to walk unassisted.   4 - Moderately severe disability. Unable to attend to own bodily needs without assistance, and unable to walk unassisted.   5 - Severe disability. Requires constant nursing care and attention, bedridden, incontinent.   6 - Dead.   NIH Stroke Scale   Level Of Consciousness 0=Alert; keenly responsive 1=Not alert, but arousable by minor stimulation 2=Not alert, requires repeated stimulation 3=Responds only with reflex movements 0  LOC Questions to Month and Age 21=Answers both questions correctly 1=Answers one question correctly 2=Answers neither question correctly 0  LOC Commands      -Open/Close eyes     -Open/close grip 0=Performs both tasks correctly 1=Performs one task correctly 2=Performs neighter task correctly 0  Best Gaze 0=Normal 1=Partial gaze palsy 2=Forced deviation, or total gaze paresis 0  Visual 0=No visual loss 1=Partial hemianopia 2=Complete hemianopia 3=Bilateral hemianopia (blind including cortical blindness) 0  Facial Palsy 0=Normal symmetrical movement 1=Minor paralysis (asymmetry) 2=Partial paralysis (lower face) 3=Complete paralysis (upper and lower face) 1  Motor  0=No drift, limb holds posture for full 10 seconds 1=Drift, limb holds posture, no drift to bed 2=Some antigravity effort, cannot maintain posture, drifts to bed 3=No effort against gravity, limb falls 4=No movement Right Arm 0     Leg 0    Left Arm 0     Leg 0  Limb Ataxia 0=Absent 1=Present in one limb 2=Present in two limbs 0  Sensory 0=Normal 1=Mild to moderate sensory loss 2=Severe to total sensory loss 0  Best Language 0=No aphasia, normal 1=Mild to moderate aphasia 2=Mute, global aphasia 3=Mute, global aphasia 0  Dysarthria 0=Normal 1=Mild to moderate 2=Severe, unintelligible or mute/anarthric 1    Extinction/Neglect 0=No abnormality 1=Extinction to bilateral simultaneous stimulation 2=Profound neglect 0  Total   2     Data reviewed: I personally reviewed the images and agree with the radiology interpretations.  Ct Head Wo Contrast 08/23/2015   No acute intracranial abnormality. Stable brain parenchymal atrophy and chronic microvascular disease. Stable appearance of right thalamic lacunar infarct. Bilateral maxillary sinus disease which may represent mucous retention cysts and/or chronic sinusitis.   Mri & Mra Head Wo Contrast 08/23/2015   Acute infarct right posterior frontal lobe. Progression of chronic microvascular ischemic changes since 2012 Negative MRA circle of Willis.   Carotid Duplex  Mild plaque in bilateral internal carotid arteries with no evidence of a significant stenosis - 1-39%. Vertebral arteries demonstrate antegrade flow.  2-D echocardiogram 08/24/2015 - Left ventricle: The cavity size was normal. Wall thickness was increased in a pattern of moderate LVH. Systolic function was normal. The estimated ejection fraction was in the range of 55% to 60%. Wall motion was normal; there were no regional wall motion abnormalities. Doppler parameters are consistent with abnormal left ventricular relaxation (grade 1 diastolic dysfunction). - Aortic valve: There was mild regurgitation. - Pulmonary arteries: PA peak pressure: 36 mm Hg (S). - Pericardium, extracardiac: A trivial pericardial effusion was identified.  LE venous doppler - negative for DVT  Component     Latest Ref Rng & Units 08/23/2015 08/25/2015  Cholesterol     0 - 200 mg/dL  88  Triglycerides     <150 mg/dL  135  HDL Cholesterol     >40 mg/dL  24 (L)  Total CHOL/HDL Ratio     RATIO  3.7  VLDL     0 - 40 mg/dL  27  LDL (calc)     0 - 99 mg/dL  37  Hemoglobin A1C     4.8 - 5.6 % 8.5 (H)   Mean Plasma Glucose     mg/dL 197   TSH     0.350 - 4.500 uIU/mL  1.155    Assessment: As you may  recall, he is a 75 y.o. African American male with PMH of HTN, HLD, DMII, and h/o ? stroke with left hand mild dexterity difficulty admitted on 08/23/15 for dysarthria and L facial droop x 3 days.  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 Other stroke workup including MRA, carotid Doppler, 2-D echo and DVT were all negative. Schedule TEE/loop the patient declined. LDL 37 and A1c 8.5. Patient aspirin 81 was changed to aspirin 325. Continued on Crestor. Discharged home in good condition. No recurrent stroke like symptoms. Still has mild left facial droop. On aspirin and Crestor. However, still taking ibuprofen every day for pain. 30 day monitoring ongoing at this moment. He has not quit smoking yet. Patient declined RESPECT ESUS trial. Will do TCD MI detection to finish off stroke workup.  Plan:  - continue ASA and crestor for stroke prevention - use tylenol for pain and limit ibuprofen use - continue cardiac event monitoring - you declined the research study - will do TCD emboli detection. - Follow up with your primary care physician for stroke risk factor modification. Recommend maintain blood pressure goal <130/80, diabetes with hemoglobin A1c goal below 7.0% and lipids with LDL cholesterol goal below 70 mg/dL.  - check BP and glucose at home and record - follow up in 4  Months.   I spent more than 25 minutes of face to face time with the patient. Greater than 50% of time was spent in counseling and coordination of care. We discussed clinical trial, cardiac event monitoring, continued aspirin and Crestor, and home monitoring of blood pressure and glucose.   Orders Placed This Encounter  Procedures  . [place] TCD [facility]    Standing Status:   Future    Standing Expiration Date:   05/01/2016    Order Specific Question:   Reason for Exam (SYMPTOM  OR DIAGNOSIS REQUIRED)    Answer:   embolic stroke    Order Specific Question:   Preferred imaging location?    Answer:   Internal    No  orders of the defined types were placed in this encounter.   Patient Instructions  - continue ASA and crestor for stroke prevention - use tylenol for pain and limit ibuprofen use - continue cardiac event monitoring - you declined the research study - will do TCD emboli detection - Follow up with your primary care physician for stroke risk factor modification. Recommend maintain blood pressure goal <130/80, diabetes with hemoglobin A1c goal below 7.0% and lipids with LDL cholesterol goal below 70 mg/dL.  - check BP and glucose at home and record - follow up in 4  Months.    Rosalin Hawking, MD PhD [facility] [address], [place],  [address] [phone]

## 2015-10-31 ENCOUNTER — Telehealth: Payer: Self-pay | Admitting: Neurology

## 2015-10-31 NOTE — Telephone Encounter (Signed)
Patient is ready to be scheduled for his Doppler per his insurance medicaid no [place] needed for [phone]. Thanks [employer]

## 2015-11-01 ENCOUNTER — Other Ambulatory Visit: Payer: Self-pay | Admitting: Radiation Oncology

## 2015-11-07 ENCOUNTER — Ambulatory Visit (INDEPENDENT_AMBULATORY_CARE_PROVIDER_SITE_OTHER): Payer: Medicare Other

## 2015-11-07 DIAGNOSIS — I63412 Cerebral infarction due to embolism of left middle cerebral artery: Secondary | ICD-10-CM | POA: Diagnosis not present

## 2015-11-07 DIAGNOSIS — I63411 Cerebral infarction due to embolism of right middle cerebral artery: Secondary | ICD-10-CM

## 2015-11-26 ENCOUNTER — Telehealth: Payer: Self-pay | Admitting: Neurology
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
## 2015-11-26 NOTE — Telephone Encounter (Signed)
Discussed with pt over the phone. He did not complain of TCD interaction with his eye. He has no complains and just want to know the result of TCD. I told him and explained the result to him. He is looking forward to see me in 4 months.   Hi, Katrina, could you please tell Octavia Bruckner that this pt I ordered TCD emboli detection but he did regular TCD? Hopefully he will do the right test ordered. Thanks.   Rosalin Hawking, MD PhD Stroke Neurology 11/26/2015 6:23 PM

## 2015-11-26 NOTE — Telephone Encounter (Signed)
Patient came by the office requesting to speak with the nurse regarding the Carotid doppler test that he had done on 11/07/15. The best number to contact is [phone]

## 2015-11-27 NOTE — Telephone Encounter (Signed)
Pt returned RN's call. RN was skypd. She will call the pt back

## 2015-11-27 NOTE — Telephone Encounter (Signed)
-----   Message from Rosalin Hawking, MD sent at 11/21/2015  6:58 PM EDT ----- Could you please let the patient know that the cardiac event monitoring test done recently was normal, no atrial fibrillation. Please continue current treatment. Thanks.  Rosalin Hawking, MD PhD Stroke Neurology 11/21/2015 6:58 PM

## 2015-11-27 NOTE — Telephone Encounter (Signed)
Notes Recorded by Marval Regal, RN on 11/27/2015 at 8:26 AM EDT LFt vm for patient to call back about 30 day cardiac monitor. ------

## 2015-11-27 NOTE — Telephone Encounter (Signed)
Rn call patient that his 30 day monitor was normal. Rn stated he did not have any atrial fibrillation. Pt verbalized understanding.

## 2016-01-30 ENCOUNTER — Emergency Department (HOSPITAL_COMMUNITY): Payer: No Typology Code available for payment source

## 2016-01-30 ENCOUNTER — Encounter (HOSPITAL_COMMUNITY): Payer: Self-pay | Admitting: Emergency Medicine

## 2016-01-30 ENCOUNTER — Emergency Department (HOSPITAL_COMMUNITY)
Admission: EM | Admit: 2016-01-30 | Discharge: 2016-01-30 | Disposition: A | Discharge status: Home / Self Care | Payer: No Typology Code available for payment source | Attending: Physician Assistant | Admitting: Physician Assistant

## 2016-01-30 DIAGNOSIS — S3992XA Unspecified injury of lower back, initial encounter: Secondary | ICD-10-CM | POA: Diagnosis present

## 2016-01-30 DIAGNOSIS — Y939 Activity, unspecified: Secondary | ICD-10-CM | POA: Insufficient documentation

## 2016-01-30 DIAGNOSIS — M7918 Myalgia, other site: Secondary | ICD-10-CM

## 2016-01-30 DIAGNOSIS — Y92410 Unspecified street and highway as the place of occurrence of the external cause: Secondary | ICD-10-CM | POA: Insufficient documentation

## 2016-01-30 DIAGNOSIS — Y999 Unspecified external cause status: Secondary | ICD-10-CM | POA: Insufficient documentation

## 2016-01-30 NOTE — ED Notes (Signed)
Phillis Haggis, RN aware of patient's vital signs
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
## 2016-01-30 NOTE — Discharge Instructions (Signed)
Please read attached information. If you experience any new or worsening signs or symptoms please return to the emergency room for evaluation. Please follow-up with your primary care provider or specialist as discussed.  °

## 2016-01-30 NOTE — ED Notes (Signed)
Patient undressed, in gown, on continuous pulse oximetry and blood pressure cuff sitting on side of stretcher; visitor at bedside

## 2016-02-21 ENCOUNTER — Other Ambulatory Visit: Payer: Self-pay | Admitting: Radiation Oncology

## 2016-03-17 ENCOUNTER — Ambulatory Visit (INDEPENDENT_AMBULATORY_CARE_PROVIDER_SITE_OTHER): Payer: Medicare Other | Admitting: Neurology

## 2016-03-17 ENCOUNTER — Encounter: Payer: Self-pay | Admitting: Neurology

## 2016-03-17 VITALS — BP 129/79 | HR 55 | Ht 67.0 in | Wt 203.6 lb

## 2016-03-17 DIAGNOSIS — I10 Essential (primary) hypertension: Secondary | ICD-10-CM | POA: Diagnosis not present

## 2016-03-17 DIAGNOSIS — I63411 Cerebral infarction due to embolism of right middle cerebral artery: Secondary | ICD-10-CM | POA: Diagnosis not present

## 2016-03-17 DIAGNOSIS — F17200 Nicotine dependence, unspecified, uncomplicated: Secondary | ICD-10-CM

## 2016-03-17 DIAGNOSIS — E785 Hyperlipidemia, unspecified: Secondary | ICD-10-CM

## 2016-03-17 DIAGNOSIS — E119 Type 2 diabetes mellitus without complications: Secondary | ICD-10-CM | POA: Diagnosis not present
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
## 2016-03-17 NOTE — Patient Instructions (Addendum)
-   continue ASA and crestor for stroke prevention - use tylenol and tramadol for pain and limit ibuprofen use - Follow up with your primary care physician for stroke risk factor modification. Recommend maintain blood pressure goal <130/80, diabetes with hemoglobin A1c goal below 7.0% and lipids with LDL cholesterol goal below 70 mg/dL.  - check BP and glucose at home and record - quit smoking - follow up in 6 Months.

## 2016-06-26 ENCOUNTER — Telehealth: Payer: Self-pay | Admitting: Radiation Oncology

## 2016-06-26 NOTE — Telephone Encounter (Signed)
Phoned pharmacy and spoke with Shiny. Explained we received three refill request forms for this patient's Flomax. Explained this patient has never been seen by Dr. Tyler Pita. Questioned if the patient belonged to Dr. Alexis Frock since they are often confused for one another. Shiny committed to looking into this and stopping the fax to this office.

## 2016-08-06 ENCOUNTER — Encounter: Payer: Self-pay | Admitting: Cardiovascular Disease

## 2016-08-06 ENCOUNTER — Ambulatory Visit (INDEPENDENT_AMBULATORY_CARE_PROVIDER_SITE_OTHER): Payer: Medicare Other | Admitting: Cardiovascular Disease

## 2016-08-06 DIAGNOSIS — F17200 Nicotine dependence, unspecified, uncomplicated: Secondary | ICD-10-CM

## 2016-08-06 DIAGNOSIS — I10 Essential (primary) hypertension: Secondary | ICD-10-CM

## 2016-08-06 DIAGNOSIS — E7800 Pure hypercholesterolemia, unspecified: Secondary | ICD-10-CM

## 2016-08-06 NOTE — Assessment & Plan Note (Signed)
History of essential hypertension blood pressure measured 124/74. He is on Norvasc and metoprolol. Continue current meds at current dosing

## 2016-08-06 NOTE — Patient Instructions (Signed)
Your physician wants you to follow-up in: 1 year with Dr Berry. You will receive a reminder letter in the mail two months in advance. If you don't receive a letter, please call our office to schedule the follow-up appointment.  

## 2016-08-06 NOTE — Assessment & Plan Note (Signed)
History of hyperlipidemia on statin therapy followed by his PCP. His last lipid profile in our chart performed 08/25/15 revealed total cholesterol 88, LDL 37 and HDL of 24.

## 2016-08-06 NOTE — Assessment & Plan Note (Signed)
History of prior stroke seen by Dr. Erlinda Hong, neurologist, who recommended a TEE and loop recorder which the patient declined.

## 2016-08-06 NOTE — Progress Notes (Signed)
08/06/2016 Peter Mann   30-Oct-1940  [id number]  Primary Physician Peter Ebbs, MD Primary Cardiologist: Peter Harp MD Peter Mann  HPI:  Peter Mann is a 76 year old mildly overweight separated African American male father of 2 children referred by Peter Mann for cardiovascular evaluation because of chest pain. I last saw him in the office 10/09/15. He is retired from working [employer]. His cardiac risk factor profile is positive for 100-pack-year history of tobacco abuse still continuing to smoke off and on. History of hypertension and dyslipidemia. He also has a history of prostate cancer. Because of chest pain several years ago he underwent Myoview stress testing which was normal at that time (03/11/13). He was recently admitted with a stroke 08/23/15. CT scan showed acute infarct of the right posterior frontal lobe. His aspirin was increased to full dose and he was scheduled for a TEE and loop recorder implantation however he left prior to having this performed And has had slowly declined this. Since I saw him in the office here to remain chronically stable denying chest pain or shortness of breath.   Current Outpatient Prescriptions  Medication Sig Dispense Refill  . Acetaminophen (TYLENOL 8 HOUR ARTHRITIS PAIN PO) Take by mouth.    . ALPRAZolam (XANAX) 0.25 MG tablet Take 0.25 mg by mouth daily as needed for anxiety.    Marland Kitchen amLODipine (NORVASC) 10 MG tablet Take 10 mg by mouth daily.    Marland Kitchen aspirin 325 MG tablet Take 1 tablet (325 mg total) by mouth daily. 30 tablet 0  . Calcium & Magnesium Carbonates (MYLANTA PO) Take by mouth.    . Calcium Carb-Cholecalciferol (CALCIUM 600+D) 600-800 MG-UNIT TABS Take 1 tablet by mouth 2 (two) times daily.    . cycloSPORINE (RESTASIS) 0.05 % ophthalmic emulsion Place 1 drop into the left eye every 12 (twelve) hours.    Marland Kitchen dexlansoprazole (DEXILANT) 60 MG capsule Take 60 mg by mouth daily.    Marland Kitchen linaclotide (LINZESS) 72  MCG capsule Take 72 mcg by mouth daily before breakfast.    . magnesium hydroxide (MILK OF MAGNESIA) 400 MG/5ML suspension Take 60 mLs by mouth daily as needed for mild constipation.     . meclizine (ANTIVERT) 25 MG tablet Take 25 mg by mouth 2 (two) times daily.    . metoprolol (LOPRESSOR) 50 MG tablet Take 75 mg by mouth 2 (two) times daily.    . metoprolol tartrate (LOPRESSOR) 25 MG tablet Take 0.5 tablets (12.5 mg total) by mouth 2 (two) times daily. 60 tablet 0  . polyethylene glycol (MIRALAX / GLYCOLAX) packet Take 17 g by mouth daily. 14 each 0  . rosuvastatin (CRESTOR) 20 MG tablet Take 20 mg by mouth daily.    . saxagliptin HCl (ONGLYZA) 5 MG TABS tablet Take 5 mg by mouth daily.    . tamsulosin (FLOMAX) 0.4 MG CAPS capsule Take 0.4 mg by mouth daily.     Marland Kitchen tiZANidine (ZANAFLEX) 4 MG tablet Take 4 mg by mouth at bedtime.     . traMADol (ULTRAM) 50 MG tablet Take 1 tablet (50 mg total) by mouth every 12 (twelve) hours as needed for moderate pain. (Patient taking differently: Take 50 mg by mouth at bedtime. ) 60 tablet 0   No current facility-administered medications for this visit.     No Known Allergies  Social History   Social History  . Marital status: Legally Separated    Spouse name: N/A  .  Number of children: N/A  . Years of education: N/A   Occupational History  . Not on file.   Social History Main Topics  . Smoking status: Current Some Day Smoker    Packs/day: 0.25  . Smokeless tobacco: Never Used     Comment: smoke 3 to 4 cigarettes a day  . Alcohol use No     Comment: quit 25 years ago though before "drank so much that you couldn't tell b/t alcohol and wine"  . Drug use: No  . Sexual activity: No   Other Topics Concern  . Not on file   Social History Narrative  . No narrative on file     Review of Systems: General: negative for chills, fever, night sweats or weight changes.  Cardiovascular: negative for chest pain, dyspnea on exertion, edema,  orthopnea, palpitations, paroxysmal nocturnal dyspnea or shortness of breath Dermatological: negative for rash Respiratory: negative for cough or wheezing Urologic: negative for hematuria Abdominal: negative for nausea, vomiting, diarrhea, bright red blood per rectum, melena, or hematemesis Neurologic: negative for visual changes, syncope, or dizziness All other systems reviewed and are otherwise negative except as noted above.    Blood pressure 124/74, pulse (!) 49, height 5\' 5"  (1.651 m), weight 202 lb (91.6 kg).  General appearance: alert and no distress Neck: no adenopathy, no carotid bruit, no JVD, supple, symmetrical, trachea midline and thyroid not enlarged, symmetric, no tenderness/mass/nodules Lungs: clear to auscultation bilaterally Heart: regular rate and rhythm, S1, S2 normal, no murmur, click, rub or gallop Extremities: extremities normal, atraumatic, no cyanosis or edema  EKG Sinus bradycardia 49 without ST or T-wave changes. I personally reviewed this EKG.  ASSESSMENT AND PLAN:   Essential hypertension History of essential hypertension blood pressure measured 124/74. He is on Norvasc and metoprolol. Continue current meds at current dosing  Hyperlipidemia History of hyperlipidemia on statin therapy followed by his PCP. His last lipid profile in our chart performed 08/25/15 revealed total cholesterol 88, LDL 37 and HDL of 24.  Smoker History of continued tobacco abuse recalcitrant to risk factor modification.  CVA (cerebral vascular accident) [facility]) History of prior stroke seen by Dr. Erlinda Mann, neurologist, who recommended a TEE and loop recorder which the patient declined.      Peter Harp MD FACP,FACC,FAHA, [facility] 08/06/2016 2:11 PM

## 2016-08-06 NOTE — Assessment & Plan Note (Signed)
History of continued tobacco abuse recalcitrant to risk factor modification 

## 2016-08-11 NOTE — Addendum Note (Signed)
Addended by: Zebedee Iba on: 08/11/2016 10:07 AM   Modules accepted: Orders

## 2016-09-08 ENCOUNTER — Encounter: Payer: Self-pay | Admitting: Nurse Practitioner

## 2016-09-08 ENCOUNTER — Encounter (INDEPENDENT_AMBULATORY_CARE_PROVIDER_SITE_OTHER): Payer: Self-pay

## 2016-09-08 ENCOUNTER — Ambulatory Visit (INDEPENDENT_AMBULATORY_CARE_PROVIDER_SITE_OTHER): Payer: Medicare Other | Admitting: Nurse Practitioner

## 2016-09-08 VITALS — BP 122/72 | HR 50 | Wt 204.0 lb

## 2016-09-08 DIAGNOSIS — E7800 Pure hypercholesterolemia, unspecified: Secondary | ICD-10-CM | POA: Diagnosis not present

## 2016-09-08 DIAGNOSIS — I10 Essential (primary) hypertension: Secondary | ICD-10-CM | POA: Diagnosis not present

## 2016-09-08 DIAGNOSIS — E119 Type 2 diabetes mellitus without complications: Secondary | ICD-10-CM | POA: Diagnosis not present

## 2016-09-08 DIAGNOSIS — I63411 Cerebral infarction due to embolism of right middle cerebral artery: Secondary | ICD-10-CM

## 2016-09-08 NOTE — Patient Instructions (Addendum)
Stressed the importance of management of risk factors to prevent further stroke Continue aspirinfor secondary stroke prevention Maintain strict control of hypertension with blood pressure goal below 130/90, today's reading 122/72 continue antihypertensive medications Control of diabetes with hemoglobin A1c below 6.5 followed by primary care  continue diabetic medications Cholesterol with LDL cholesterol less than 70, followed by primary care,  continue Crestor Exercise by walking,  eat healthy diet with whole grains,  fresh fruits and vegetables Patient: Congratulated on smoking cessation Discharge from stroke clinic  Stroke Prevention Some health problems and behaviors may make it more likely for you to have a stroke. Below are ways to lessen your risk of having a stroke.  Be active for at least 30 minutes on most or all days.  Do not smoke. Try not to be around others who smoke.  Do not drink too much alcohol. ? Do not have more than 2 drinks a day if you are a man. ? Do not have more than 1 drink a day if you are a woman and are not pregnant.  Eat healthy foods, such as fruits and vegetables. If you were put on a specific diet, follow the diet as told.  Keep your cholesterol levels under control through diet and medicines. Look for foods that are low in saturated fat, trans fat, cholesterol, and are high in fiber.  If you have diabetes, follow all diet plans and take your medicine as told.  Ask your doctor if you need treatment to lower your blood pressure. If you have high blood pressure (hypertension), follow all diet plans and take your medicine as told by your doctor.  If you are 65-98 years old, have your blood pressure checked every 3-5 years. If you are age 70 or older, have your blood pressure checked every year.  Keep a healthy weight. Eat foods that are low in calories, salt, saturated fat, trans fat, and cholesterol.  Do not take drugs.  Avoid birth control pills, if  this applies. Talk to your doctor about the risks of taking birth control pills.  Talk to your doctor if you have sleep problems (sleep apnea).  Take all medicine as told by your doctor. ? You may be told to take aspirin or blood thinner medicine. Take this medicine as told by your doctor. ? Understand your medicine instructions.  Make sure any other conditions you have are being taken care of.  Get help right away if:  You suddenly lose feeling (you feel numb) or have weakness in your face, arm, or leg.  Your face or eyelid hangs down to one side.  You suddenly feel confused.  You have trouble talking (aphasia) or understanding what people are saying.  You suddenly have trouble seeing in one or both eyes.  You suddenly have trouble walking.  You are dizzy.  You lose your balance or your movements are clumsy (uncoordinated).  You suddenly have a very bad headache and you do not know the cause.  You have new chest pain.  Your heart feels like it is fluttering or skipping a beat (irregular heartbeat). Do not wait to see if the symptoms above go away. Get help right away. Call your local emergency services (911 in [place] Do not drive yourself to the hospital. This information is not intended to replace advice given to you by your health care provider. Make sure you discuss any questions you have with your health care provider. Document Released: 08/26/2011 Document Revised: 08/02/2015 Document Reviewed: 08/27/2012  Chartered certified accountant Patient Education  [employer].
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
## 2016-09-10 NOTE — Progress Notes (Signed)
I agree with the above plan 

## 2016-09-15 ENCOUNTER — Ambulatory Visit: Payer: Medicare Other | Admitting: Nurse Practitioner

## 2016-12-31 ENCOUNTER — Encounter (HOSPITAL_COMMUNITY): Payer: Self-pay | Admitting: *Deleted

## 2016-12-31 ENCOUNTER — Emergency Department (HOSPITAL_COMMUNITY)
Admission: EM | Admit: 2016-12-31 | Discharge: 2016-12-31 | Disposition: A | Discharge status: Home / Self Care | Payer: Medicare Other | Attending: Emergency Medicine | Admitting: Emergency Medicine

## 2016-12-31 DIAGNOSIS — Y998 Other external cause status: Secondary | ICD-10-CM | POA: Diagnosis not present

## 2016-12-31 DIAGNOSIS — Z79899 Other long term (current) drug therapy: Secondary | ICD-10-CM | POA: Diagnosis not present

## 2016-12-31 DIAGNOSIS — Y9389 Activity, other specified: Secondary | ICD-10-CM | POA: Diagnosis not present

## 2016-12-31 DIAGNOSIS — Y929 Unspecified place or not applicable: Secondary | ICD-10-CM | POA: Diagnosis not present

## 2016-12-31 DIAGNOSIS — I10 Essential (primary) hypertension: Secondary | ICD-10-CM | POA: Insufficient documentation

## 2016-12-31 DIAGNOSIS — Z8546 Personal history of malignant neoplasm of prostate: Secondary | ICD-10-CM | POA: Diagnosis not present

## 2016-12-31 DIAGNOSIS — Z923 Personal history of irradiation: Secondary | ICD-10-CM | POA: Insufficient documentation

## 2016-12-31 DIAGNOSIS — F17210 Nicotine dependence, cigarettes, uncomplicated: Secondary | ICD-10-CM | POA: Diagnosis not present

## 2016-12-31 DIAGNOSIS — Z7982 Long term (current) use of aspirin: Secondary | ICD-10-CM | POA: Diagnosis not present

## 2016-12-31 DIAGNOSIS — R51 Headache: Secondary | ICD-10-CM | POA: Diagnosis present

## 2016-12-31 DIAGNOSIS — M7918 Myalgia, other site: Secondary | ICD-10-CM | POA: Insufficient documentation

## 2016-12-31 DIAGNOSIS — E119 Type 2 diabetes mellitus without complications: Secondary | ICD-10-CM | POA: Insufficient documentation

## 2016-12-31 DIAGNOSIS — Z7984 Long term (current) use of oral hypoglycemic drugs: Secondary | ICD-10-CM | POA: Diagnosis not present

## 2016-12-31 DIAGNOSIS — Z8673 Personal history of transient ischemic attack (TIA), and cerebral infarction without residual deficits: Secondary | ICD-10-CM | POA: Insufficient documentation
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
## 2017-04-03 ENCOUNTER — Ambulatory Visit (INDEPENDENT_AMBULATORY_CARE_PROVIDER_SITE_OTHER): Payer: Medicare Other | Admitting: Podiatry

## 2017-04-03 ENCOUNTER — Encounter: Payer: Self-pay | Admitting: Podiatry

## 2017-04-03 VITALS — BP 135/77 | HR 53 | Resp 16

## 2017-04-03 DIAGNOSIS — M2010 Hallux valgus (acquired), unspecified foot: Secondary | ICD-10-CM | POA: Diagnosis not present

## 2017-04-03 DIAGNOSIS — L84 Corns and callosities: Secondary | ICD-10-CM | POA: Diagnosis not present

## 2017-04-03 DIAGNOSIS — E1149 Type 2 diabetes mellitus with other diabetic neurological complication: Secondary | ICD-10-CM

## 2017-04-03 NOTE — Progress Notes (Signed)
   Subjective:    Patient ID: Peter Mann, male    DOB: April 10, 1940, 77 y.o.   MRN: [id number]  HPI Peter Mann presents the office today requesting diabetic shoes.  He is under the care of another physician and podiatrist for nail care and routine care.  He does have neuropathy.  He denies any claudication symptoms.  He has no other concerns today.   Review of Systems  Constitutional: Positive for fatigue.  Musculoskeletal: Positive for arthralgias and gait problem.  All other systems reviewed and are negative.      Objective:   Physical Exam  General: AAO x3, NAD  Dermatological: Mild hyperkeratotic tissue bilateral medial hallux.  No evidence of ulcerations identified bilaterally.  No erythema or skin breakdown.  Vascular: Dorsalis Pedis artery and Posterior Tibial artery pedal pulses are 2/4 bilateral with immedate capillary fill time.  There is no pain with calf compression, swelling, warmth, erythema.   Neruologic: Grossly intact via light touch bilateral. Protective threshold with Semmes Wienstein monofilament decreased.   Musculoskeletal: Bunion is present bilaterally.  There is no area of tenderness identified bilaterally.  Muscular strength 5/5 in all groups tested bilateral.  Gait: Unassisted, Nonantalgic.      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  Paperwork was completed today for precertification.  He was measured for shoes today as well.  We will call him once the shoes come in.  -Daily foot inspection.  He is currently undergoing a podiatrist here for diabetic foot care.  Trula Slade DPM

## 2017-04-03 NOTE — Patient Instructions (Signed)

## 2017-06-11 ENCOUNTER — Telehealth: Payer: Self-pay | Admitting: Podiatry

## 2017-06-11 NOTE — Telephone Encounter (Signed)
Left message for pt to call to schedule an appt to [facility]. We finally got paperwork.Marland KitchenMarland Kitchen

## 2017-06-22 ENCOUNTER — Ambulatory Visit (INDEPENDENT_AMBULATORY_CARE_PROVIDER_SITE_OTHER): Payer: Medicare Other | Admitting: Orthotics

## 2017-06-22 DIAGNOSIS — E119 Type 2 diabetes mellitus without complications: Secondary | ICD-10-CM

## 2017-06-22 DIAGNOSIS — M2010 Hallux valgus (acquired), unspecified foot: Secondary | ICD-10-CM

## 2017-06-22 NOTE — Progress Notes (Signed)

## 2017-08-07 ENCOUNTER — Ambulatory Visit (INDEPENDENT_AMBULATORY_CARE_PROVIDER_SITE_OTHER): Payer: Medicare Other | Admitting: Cardiovascular Disease

## 2017-08-07 ENCOUNTER — Encounter: Payer: Self-pay | Admitting: Cardiovascular Disease

## 2017-08-07 VITALS — BP 134/70 | Ht 67.0 in | Wt 193.0 lb

## 2017-08-07 DIAGNOSIS — E7800 Pure hypercholesterolemia, unspecified: Secondary | ICD-10-CM | POA: Diagnosis not present

## 2017-08-07 DIAGNOSIS — F17200 Nicotine dependence, unspecified, uncomplicated: Secondary | ICD-10-CM

## 2017-08-07 DIAGNOSIS — I10 Essential (primary) hypertension: Secondary | ICD-10-CM

## 2017-08-07 NOTE — Progress Notes (Signed)
08/07/2017 Peter Mann   [birth year]  [id number]  Primary Physician Nolene Ebbs, MD Primary Cardiologist: Lorretta Harp MD Lupe Carney, [place]  HPI:  Peter Mann is a 77 y.o.  mildly overweight separated African American male father of 2 children referred by Dr. Pierce Crane for cardiovascular evaluation because of chest pain. I last saw him in the office  08/06/2016. He is retired from working [employer]. His cardiac risk factor profile is positive for 100-pack-year history of tobacco abuse still continuing to smoke off and on.History of hypertension and dyslipidemia. He also has a history of prostate cancer. Because of chest pain several years ago he underwent Myoview stress testing which was normal at that time (03/11/13). He was recently admitted with a stroke 08/23/15. CT scan showed acute infarct of the right posterior frontal lobe. His aspirin was increased to full dose and he was scheduled for a TEE and loop recorder implantation however he left prior to having this performed And has had slowly declined this. Since I saw him in the office he continues to remain stable denying chest pain or shortness of breath.     Current Meds  Medication Sig  . Acetaminophen (TYLENOL 8 HOUR ARTHRITIS PAIN PO) Take by mouth.  . ALPRAZolam (XANAX) 0.25 MG tablet Take 0.25 mg by mouth daily as needed for anxiety.  Marland Kitchen amLODipine (NORVASC) 10 MG tablet Take 10 mg by mouth daily.  Marland Kitchen aspirin 325 MG tablet Take 1 tablet (325 mg total) by mouth daily.  . Calcium Carb-Cholecalciferol (CALCIUM 600+D) 600-800 MG-UNIT TABS Take 1 tablet by mouth 2 (two) times daily.  . cycloSPORINE (RESTASIS) 0.05 % ophthalmic emulsion Place 1 drop into the left eye every 12 (twelve) hours.  Marland Kitchen dexlansoprazole (DEXILANT) 60 MG capsule Take 60 mg by mouth daily.  Marland Kitchen linaclotide (LINZESS) 72 MCG capsule Take 72 mcg by mouth daily before breakfast.  . magnesium hydroxide (MILK OF MAGNESIA) 400 MG/5ML  suspension Take 60 mLs by mouth daily as needed for mild constipation.   . meclizine (ANTIVERT) 25 MG tablet Take 25 mg by mouth 2 (two) times daily.  . metoprolol (LOPRESSOR) 50 MG tablet Take 75 mg by mouth 2 (two) times daily.  . ONE TOUCH ULTRA TEST test strip   . polyethylene glycol (MIRALAX / GLYCOLAX) packet Take 17 g by mouth daily.  . rosuvastatin (CRESTOR) 20 MG tablet Take 20 mg by mouth daily.  . saxagliptin HCl (ONGLYZA) 5 MG TABS tablet Take 5 mg by mouth daily.  . tamsulosin (FLOMAX) 0.4 MG CAPS capsule Take 0.4 mg by mouth daily.   Marland Kitchen tiZANidine (ZANAFLEX) 4 MG tablet Take 4 mg by mouth at bedtime.   . traMADol (ULTRAM) 50 MG tablet Take 1 tablet (50 mg total) by mouth every 12 (twelve) hours as needed for moderate pain. (Patient taking differently: Take 50 mg by mouth at bedtime. )     No Known Allergies  Social History   Socioeconomic History  . Marital status: Legally Separated    Spouse name: Not on file  . Number of children: 3  . Years of education: 7  . Highest education level: Not on file  Occupational History  . Not on file  Social Needs  . Financial resource strain: Not on file  . Food insecurity:    Worry: Not on file    Inability: Not on file  . Transportation needs:    Medical: Not on file    Non-medical:  Not on file  Tobacco Use  . Smoking status: Current Every Day Smoker    Packs/day: 0.25    Last attempt to quit: 08/18/2016    Years since quitting: 0.9  . Smokeless tobacco: Never Used  . Tobacco comment: smoke 3 to 4 cigarettes a day, 09/08/16 quit 3 wks ago  Substance and Sexual Activity  . Alcohol use: No    Alcohol/week: 0.0 oz    Comment: quit 25 years ago though before "drank so much that you couldn't tell b/t alcohol and wine"  . Drug use: No  . Sexual activity: Never  Lifestyle  . Physical activity:    Days per week: Not on file    Minutes per session: Not on file  . Stress: Not on file  Relationships  . Social connections:     Talks on phone: Not on file    Gets together: Not on file    Attends religious service: Not on file    Active member of club or organization: Not on file    Attends meetings of clubs or organizations: Not on file    Relationship status: Not on file  . Intimate partner violence:    Fear of current or ex partner: Not on file    Emotionally abused: Not on file    Physically abused: Not on file    Forced sexual activity: Not on file  Other Topics Concern  . Not on file  Social History Narrative   Lives alone     Review of Systems: General: negative for chills, fever, night sweats or weight changes.  Cardiovascular: negative for chest pain, dyspnea on exertion, edema, orthopnea, palpitations, paroxysmal nocturnal dyspnea or shortness of breath Dermatological: negative for rash Respiratory: negative for cough or wheezing Urologic: negative for hematuria Abdominal: negative for nausea, vomiting, diarrhea, bright red blood per rectum, melena, or hematemesis Neurologic: negative for visual changes, syncope, or dizziness All other systems reviewed and are otherwise negative except as noted above.    Blood pressure 134/70, height 5\' 7"  (1.702 m), weight 193 lb (87.5 kg).  General appearance: alert and no distress Neck: no adenopathy, no carotid bruit, no JVD, supple, symmetrical, trachea midline and thyroid not enlarged, symmetric, no tenderness/mass/nodules Lungs: clear to auscultation bilaterally Heart: regular rate and rhythm, S1, S2 normal, no murmur, click, rub or gallop Extremities: extremities normal, atraumatic, no cyanosis or edema Pulses: 2+ and symmetric Skin: Skin color, texture, turgor normal. No rashes or lesions Neurologic: Alert and oriented X 3, normal strength and tone. Normal symmetric reflexes. Normal coordination and gait  EKG sinus bradycardia 46 without ST or T wave changes.  I personally reviewed this EKG.  ASSESSMENT AND PLAN:   Essential hypertension History  of essential hypertension her blood pressure measured today at 134/70.  He is on amlodipine and metoprolol.  Continue current meds at current dosing.  Hyperlipidemia History of hyperlipidemia on statin therapy  Smoker History of continued tobacco abuse of 1 pack/week unchanged from prior usage      Lorretta Harp MD [facility], [facility] 08/07/2017 9:45 AM

## 2017-08-07 NOTE — Assessment & Plan Note (Signed)
History of continued tobacco abuse of 1 pack/week unchanged from prior usage

## 2017-08-07 NOTE — Assessment & Plan Note (Signed)
History of hyperlipidemia on statin therapy. 

## 2017-08-07 NOTE — Patient Instructions (Signed)
Medication Instructions: Your physician recommends that you continue on your current medications as directed. Please refer to the Current Medication list given to you today.   Follow-Up: Your physician recommends that you schedule a follow-up appointment as needed with Dr. Berry.    

## 2017-08-07 NOTE — Assessment & Plan Note (Signed)
History of essential hypertension her blood pressure measured today at 134/70.  He is on amlodipine and metoprolol.  Continue current meds at current dosing.

## 2017-08-19 ENCOUNTER — Ambulatory Visit: Payer: Medicare Other | Admitting: Orthotics

## 2017-08-19 DIAGNOSIS — E1149 Type 2 diabetes mellitus with other diabetic neurological complication: Secondary | ICD-10-CM

## 2017-08-19 DIAGNOSIS — M2010 Hallux valgus (acquired), unspecified foot: Secondary | ICD-10-CM

## 2017-08-19 NOTE — Progress Notes (Signed)
Saw patient today and his complaint was two fold: 1) felt like shoes made him rock forward.  (I adjusted heel) and 2) upset that he had a 29.00 co pay.  Told him he would have to see Vickie, but that was a pretty good deal for shoes.

## 2018-02-02 ENCOUNTER — Telehealth: Payer: Self-pay | Admitting: Cardiovascular Disease

## 2018-02-02 NOTE — Telephone Encounter (Signed)
New message    Pt c/o of Chest Pain: STAT if CP now or developed within 24 hours  1. Are you having CP right now? Some pain. Sharp pain around heart on saturday  2. Are you experiencing any other symptoms (ex. SOB, nausea, vomiting, sweating)? no  3. How long have you been experiencing CP?   4. Is your CP continuous or coming and going? Since Saturday coming and going  5. Have you taken Nitroglycerin? No.  ?

## 2018-02-02 NOTE — Telephone Encounter (Signed)
SPOKE TO PATIENT- NO ACTIVE CHEST PAIN . VERBALIZED UNDERSTANDING OF TOMORROW APPOINTMENT WITH DR BERRY  RN INFORMED PATIENT IF CHEST PAIN WORSEN BETWEEN NOW AND APPOINTMENT CALL 911 AND GO TO ER. PATIENT STATES HE DOES NOT HAVE NTG TABLETS AVAILABLE.

## 2018-02-03 ENCOUNTER — Ambulatory Visit (INDEPENDENT_AMBULATORY_CARE_PROVIDER_SITE_OTHER): Payer: Medicare Other | Admitting: Cardiovascular Disease

## 2018-02-03 ENCOUNTER — Encounter: Payer: Self-pay | Admitting: Cardiovascular Disease

## 2018-02-03 DIAGNOSIS — E7800 Pure hypercholesterolemia, unspecified: Secondary | ICD-10-CM

## 2018-02-03 DIAGNOSIS — I10 Essential (primary) hypertension: Secondary | ICD-10-CM

## 2018-02-03 DIAGNOSIS — F17200 Nicotine dependence, unspecified, uncomplicated: Secondary | ICD-10-CM | POA: Diagnosis not present

## 2018-02-03 NOTE — Assessment & Plan Note (Signed)
History of hyperlipidemia on statin therapy with lipid profile performed 08/25/2015 revealing total of 88, LDL 37 and HDL 24.

## 2018-02-03 NOTE — Assessment & Plan Note (Signed)
Continued tobacco abuse recalcitrant risk factor modification.

## 2018-02-03 NOTE — Assessment & Plan Note (Signed)
History of essential hypertension her blood pressure measured today 130/64.  He is on amlodipine, metoprolol.  Continue current meds at current dosing.

## 2018-02-03 NOTE — Progress Notes (Signed)
02/03/2018 Peter Mann   January 09, 1941  [id number]  Primary Physician Nolene Ebbs, MD Primary Cardiologist: Lorretta Harp MD Lupe Carney, [place]  HPI:  Peter Mann is a 77 y.o.   mildly overweight separated African American male father of 2 children referred by Dr. Pierce Crane for cardiovascular evaluation because of chest pain. I last saw him in the office  08/07/2017. He is retired from working [employer]. His cardiac risk factor profile is positive for 100-pack-year history of tobacco abuse still continuing to smoke off and on.History of hypertension and dyslipidemia. He also has a history of prostate cancer. Because of chest pain several years ago he underwent Myoview stress testing which was normal at that time (03/11/13). He was recently admitted with a stroke 08/23/15. CT scan showed acute infarct of the right posterior frontal lobe. His aspirin was increased to full dose and he was scheduled for a TEE and loop recorder implantation however he left prior to having this performedAnd has had slowly declined this. Since I saw him in the office he continues to remain stable denying chest pain or shortness of breath.   Current Meds  Medication Sig  . Acetaminophen (TYLENOL 8 HOUR ARTHRITIS PAIN PO) Take by mouth.  Marland Kitchen amLODipine (NORVASC) 10 MG tablet Take 10 mg by mouth daily.  Marland Kitchen aspirin 325 MG tablet Take 1 tablet (325 mg total) by mouth daily.  . cycloSPORINE (RESTASIS) 0.05 % ophthalmic emulsion Place 1 drop into the left eye every 12 (twelve) hours.  Marland Kitchen dexlansoprazole (DEXILANT) 60 MG capsule Take 60 mg by mouth daily.  . diclofenac sodium (VOLTAREN) 1 % GEL Apply topically 4 (four) times daily.  Marland Kitchen linaclotide (LINZESS) 290 MCG CAPS capsule Take 290 mcg by mouth daily before breakfast.  . magnesium hydroxide (MILK OF MAGNESIA) 400 MG/5ML suspension Take 60 mLs by mouth daily as needed for mild constipation.   . meclizine (ANTIVERT) 25 MG tablet Take 25 mg by  mouth 2 (two) times daily.  . metoprolol (LOPRESSOR) 50 MG tablet Take 75 mg by mouth 2 (two) times daily.  . ONE TOUCH ULTRA TEST test strip   . rosuvastatin (CRESTOR) 20 MG tablet Take 20 mg by mouth daily.  . saxagliptin HCl (ONGLYZA) 5 MG TABS tablet Take 5 mg by mouth daily.  . sennosides-docusate sodium (SENOKOT-S) 8.6-50 MG tablet Take 1 tablet by mouth daily.  . tamsulosin (FLOMAX) 0.4 MG CAPS capsule Take 0.4 mg by mouth daily.   Marland Kitchen tiZANidine (ZANAFLEX) 4 MG tablet Take 4 mg by mouth at bedtime.      No Known Allergies  Social History   Socioeconomic History  . Marital status: Legally Separated    Spouse name: Not on file  . Number of children: 3  . Years of education: 7  . Highest education level: Not on file  Occupational History  . Not on file  Social Needs  . Financial resource strain: Not on file  . Food insecurity:    Worry: Not on file    Inability: Not on file  . Transportation needs:    Medical: Not on file    Non-medical: Not on file  Tobacco Use  . Smoking status: Current Every Day Smoker    Packs/day: 0.25    Last attempt to quit: 08/18/2016    Years since quitting: 1.4  . Smokeless tobacco: Never Used  . Tobacco comment: smoke 3 to 4 cigarettes a day, 09/08/16 quit 3 wks ago  Substance and Sexual Activity  . Alcohol use: No    Alcohol/week: 0.0 standard drinks    Comment: quit 25 years ago though before "drank so much that you couldn't tell b/t alcohol and wine"  . Drug use: No  . Sexual activity: Never  Lifestyle  . Physical activity:    Days per week: Not on file    Minutes per session: Not on file  . Stress: Not on file  Relationships  . Social connections:    Talks on phone: Not on file    Gets together: Not on file    Attends religious service: Not on file    Active member of club or organization: Not on file    Attends meetings of clubs or organizations: Not on file    Relationship status: Not on file  . Intimate partner violence:     Fear of current or ex partner: Not on file    Emotionally abused: Not on file    Physically abused: Not on file    Forced sexual activity: Not on file  Other Topics Concern  . Not on file  Social History Narrative   Lives alone     Review of Systems: General: negative for chills, fever, night sweats or weight changes.  Cardiovascular: negative for chest pain, dyspnea on exertion, edema, orthopnea, palpitations, paroxysmal nocturnal dyspnea or shortness of breath Dermatological: negative for rash Respiratory: negative for cough or wheezing Urologic: negative for hematuria Abdominal: negative for nausea, vomiting, diarrhea, bright red blood per rectum, melena, or hematemesis Neurologic: negative for visual changes, syncope, or dizziness All other systems reviewed and are otherwise negative except as noted above.    Blood pressure 130/64, pulse (!) 57, height 5\' 7"  (1.702 m), weight 194 lb (88 kg).  General appearance: alert and no distress Neck: no adenopathy, no carotid bruit, no JVD, supple, symmetrical, trachea midline and thyroid not enlarged, symmetric, no tenderness/mass/nodules Lungs: clear to auscultation bilaterally Heart: regular rate and rhythm, S1, S2 normal, no murmur, click, rub or gallop Extremities: extremities normal, atraumatic, no cyanosis or edema Pulses: 2+ and symmetric Skin: Skin color, texture, turgor normal. No rashes or lesions Neurologic: Alert and oriented X 3, normal strength and tone. Normal symmetric reflexes. Normal coordination and gait  EKG sinus bradycardia 57 with borderline LVH and early R wave transition.  Personally reviewed this EKG.  ASSESSMENT AND PLAN:   Essential hypertension History of essential hypertension her blood pressure measured today 130/64.  He is on amlodipine, metoprolol.  Continue current meds at current dosing.  Hyperlipidemia History of hyperlipidemia on statin therapy with lipid profile performed 08/25/2015 revealing  total of 88, LDL 37 and HDL 24.  Smoker Continued tobacco abuse recalcitrant risk factor modification.      Lorretta Harp MD FACP,FACC,FAHA, [facility] 02/03/2018 4:31 PM

## 2018-02-03 NOTE — Patient Instructions (Signed)
Medication Instructions:  NO CHANGE If you need a refill on your cardiac medications before your next appointment, please call your pharmacy.   Lab work: If you have labs (blood work) drawn today and your tests are completely normal, you will receive your results only by: Marland Kitchen MyChart Message (if you have MyChart) OR . A paper copy in the mail If you have any lab test that is abnormal or we need to change your treatment, we will call you to review the results.  Follow-Up: At [facility], you and your health needs are our priority.  As part of our continuing mission to provide you with exceptional heart care, we have created designated Provider Care Teams.  These Care Teams include your primary Cardiologist (physician) and Advanced Practice Providers (APPs -  Physician Assistants and Nurse Practitioners) who all work together to provide you with the care you need, when you need it. You will need a follow up appointment in 12 months.  Please call our office 2 months in advance to schedule this appointment.  You may see DR Gwenlyn Found or one of the following Advanced Practice Providers on your designated Care Team:   Kerin Ransom, PA-C Roby Lofts, [place] . Sande Rives, PA-C

## 2018-02-05 ENCOUNTER — Encounter: Payer: Self-pay | Admitting: Cardiovascular Disease

## 2018-04-16 ENCOUNTER — Emergency Department (HOSPITAL_COMMUNITY): Payer: Medicare Other

## 2018-04-16 ENCOUNTER — Other Ambulatory Visit: Payer: Self-pay

## 2018-04-16 ENCOUNTER — Encounter (HOSPITAL_COMMUNITY): Payer: Self-pay

## 2018-04-16 ENCOUNTER — Emergency Department (HOSPITAL_COMMUNITY)
Admission: EM | Admit: 2018-04-16 | Discharge: 2018-04-16 | Disposition: A | Discharge status: Home / Self Care | Payer: Medicare Other | Attending: Emergency Medicine | Admitting: Emergency Medicine

## 2018-04-16 DIAGNOSIS — Z8546 Personal history of malignant neoplasm of prostate: Secondary | ICD-10-CM | POA: Diagnosis not present

## 2018-04-16 DIAGNOSIS — Y9222 Religious institution as the place of occurrence of the external cause: Secondary | ICD-10-CM | POA: Insufficient documentation

## 2018-04-16 DIAGNOSIS — Z8673 Personal history of transient ischemic attack (TIA), and cerebral infarction without residual deficits: Secondary | ICD-10-CM | POA: Diagnosis not present

## 2018-04-16 DIAGNOSIS — F17210 Nicotine dependence, cigarettes, uncomplicated: Secondary | ICD-10-CM | POA: Diagnosis not present

## 2018-04-16 DIAGNOSIS — I10 Essential (primary) hypertension: Secondary | ICD-10-CM | POA: Insufficient documentation

## 2018-04-16 DIAGNOSIS — R0789 Other chest pain: Secondary | ICD-10-CM | POA: Insufficient documentation

## 2018-04-16 DIAGNOSIS — E119 Type 2 diabetes mellitus without complications: Secondary | ICD-10-CM | POA: Insufficient documentation

## 2018-04-16 DIAGNOSIS — W19XXXA Unspecified fall, initial encounter: Secondary | ICD-10-CM

## 2018-04-16 DIAGNOSIS — Z79899 Other long term (current) drug therapy: Secondary | ICD-10-CM | POA: Diagnosis not present

## 2018-04-16 DIAGNOSIS — Z7984 Long term (current) use of oral hypoglycemic drugs: Secondary | ICD-10-CM | POA: Insufficient documentation

## 2018-04-16 DIAGNOSIS — M7918 Myalgia, other site: Secondary | ICD-10-CM | POA: Diagnosis not present

## 2018-04-16 DIAGNOSIS — Y9389 Activity, other specified: Secondary | ICD-10-CM | POA: Insufficient documentation

## 2018-04-16 DIAGNOSIS — W010XXA Fall on same level from slipping, tripping and stumbling without subsequent striking against object, initial encounter: Secondary | ICD-10-CM | POA: Insufficient documentation

## 2018-04-16 DIAGNOSIS — Y998 Other external cause status: Secondary | ICD-10-CM | POA: Diagnosis not present

## 2018-04-16 DIAGNOSIS — Z7982 Long term (current) use of aspirin: Secondary | ICD-10-CM | POA: Insufficient documentation

## 2018-04-16 LAB — BASIC METABOLIC PANEL
Anion gap: 9 (ref 5–15)
BUN: 12 mg/dL (ref 8–23)
CO2: 23 mmol/L (ref 22–32)
Calcium: 8.9 mg/dL (ref 8.9–10.3)
Chloride: 107 mmol/L (ref 98–111)
Creatinine, Ser: 1.34 mg/dL — ABNORMAL HIGH (ref 0.61–1.24)
GFR calc Af Amer: 58 mL/min — ABNORMAL LOW (ref >60)
GFR calc non Af Amer: 50 mL/min — ABNORMAL LOW (ref >60)
Glucose, Bld: 90 mg/dL (ref 70–99)
Potassium: 3.8 mmol/L (ref 3.5–5.1)
Sodium: 139 mmol/L (ref 135–145)

## 2018-04-16 LAB — TROPONIN I: Troponin I: <0.03 ng/mL (ref <0.03)

## 2018-04-16 LAB — CBC
HCT: 40.2 % (ref 39.0–52.0)
Hemoglobin: 12.6 g/dL — ABNORMAL LOW (ref 13.0–17.0)
MCH: 26.7 pg (ref 26.0–34.0)
MCHC: 31.3 g/dL (ref 30.0–36.0)
MCV: 85.2 fL (ref 80.0–100.0)
Platelets: 187 10*3/uL (ref 150–400)
RBC: 4.72 MIL/uL (ref 4.22–5.81)
RDW: 14.6 % (ref 11.5–15.5)
WBC: 5.4 10*3/uL (ref 4.0–10.5)
nRBC: 0 % (ref 0.0–0.2)

## 2018-04-16 LAB — I-STAT TROPONIN, ED: Troponin i, poc: 0.01 ng/mL (ref 0.00–0.08)

## 2018-04-16 LAB — CBG MONITORING, ED: Glucose-Capillary: 79 mg/dL (ref 70–99)

## 2018-04-16 NOTE — ED Notes (Signed)
CBG 79 

## 2018-04-16 NOTE — ED Triage Notes (Signed)
Pt with c/o falling yesterday coming [place] hill from church; denies head trauma; states that he slipped and legs gave away; pt states he has had pain on left side of chest for over a year; it was aching yesterday and he took two tylenol and the pain went away.

## 2018-04-16 NOTE — Discharge Instructions (Addendum)
Take tylenol for your pain  SEEK IMMEDIATE MEDICAL ATTENTION IF: You have severe chest pain, especially if the pain is crushing or pressure-like and spreads to the arms, back, neck, or jaw, or if you have sweating, nausea (feeling sick to your stomach), or shortness of breath. THIS IS AN EMERGENCY. Don't wait to see if the pain will go away. Get medical help at once. Call 911 or 0 (operator). DO NOT drive yourself to the hospital.  Your chest pain gets worse and does not go away with rest.  You have an attack of chest pain lasting longer than usual, despite rest and treatment with the medications your caregiver has prescribed.  You wake from sleep with chest pain or shortness of breath.  You feel dizzy or faint.  You have chest pain not typical of your usual pain for which you originally saw your caregiver. You have Dark Tea of Cola colored urine

## 2018-04-16 NOTE — ED Provider Notes (Signed)
[facility] EMERGENCY DEPARTMENT Provider Note   CSN: [id number] Arrival date & time: 04/16/18  5573     History   Chief Complaint Chief Complaint  Patient presents with  . Chest Pain  . Fall    HPI Peter Mann is a 78 y.o. male who has a past medical history of diabetes, tobacco abuse, hyperlipidemia, hypertension, previous stroke.  Patient gives a history and he is somewhat difficult to understand secondary to facial palsy.  Essentially patient states that he was at church yesterday when he lost his footing and fell.  He denies hitting his head or losing consciousness.  He had no pain at that time.  He is complaining some achiness in his legs today which was worse after he went to the [facility].  Patient states that he had fleeting left-sided chest pain which was pinching and lasted for only a few seconds.  He denies shortness of breath, pressure, unilateral leg swelling, exertional dyspnea, orthopnea or PND.  Patient states that he just wants to get "my whole body looked at" because of the fall yesterday.  He has no focal or specific pain complaint.  HPI  Past Medical History:  Diagnosis Date  . Cancer ([place])    prostrate ca, radiation treatments  . Chest pain   . Diabetes mellitus without complication ([place])   . Dizziness   . History of tobacco abuse   . Hyperlipidemia   . Hypertension   . Stroke [facility])     Patient Active Problem List   Diagnosis Date Noted  . Smoker 03/17/2016  . Facial palsy   . Acute CVA (cerebrovascular accident) ([place])   . HLD (hyperlipidemia)   . Diabetes mellitus type 2, noninsulin dependent ([place])   . CVA (cerebral vascular accident) ([place]) 08/23/2015  . Abdominal distention   . Gangrenous cholecystitis   . Ileus ([place])   . Abdominal pain 01/28/2014  . Cholelithiasis 01/28/2014  . Cholecystitis 01/28/2014  . Symptomatic cholelithiasis 01/28/2014  . Hyperglycemia 01/28/2014  . Chest pain 02/01/2013  . Essential hypertension  02/01/2013  . Hyperlipidemia 02/01/2013    Past Surgical History:  Procedure Laterality Date  . CHOLECYSTECTOMY N/A 01/30/2014   Procedure: LAPAROSCOPIC CHOLECYSTECTOMY;  Surgeon: Ralene Ok, MD;  Location: [place];  Service: General;  Laterality: N/A;        Home Medications    Prior to Admission medications   Medication Sig Start Date End Date Taking? Authorizing Provider  Acetaminophen (TYLENOL 8 HOUR ARTHRITIS PAIN PO) Take by mouth.    [provider]  amLODipine (NORVASC) 10 MG tablet Take 10 mg by mouth daily.    [provider]  aspirin 325 MG tablet Take 1 tablet (325 mg total) by mouth daily. 08/27/15   Florencia Reasons, MD  cycloSPORINE (RESTASIS) 0.05 % ophthalmic emulsion Place 1 drop into the left eye every 12 (twelve) hours.    [provider]  dexlansoprazole (DEXILANT) 60 MG capsule Take 60 mg by mouth daily.    [provider]  diclofenac sodium (VOLTAREN) 1 % GEL Apply topically 4 (four) times daily.    [provider]  linaclotide (LINZESS) 290 MCG CAPS capsule Take 290 mcg by mouth daily before breakfast.    [provider]  magnesium hydroxide (MILK OF MAGNESIA) 400 MG/5ML suspension Take 60 mLs by mouth daily as needed for mild constipation.     [provider]  meclizine (ANTIVERT) 25 MG tablet Take 25 mg by mouth 2 (two) times daily.  [provider]  metoprolol (LOPRESSOR) 50 MG tablet Take 75 mg by mouth 2 (two) times daily.    [provider]  ONE TOUCH ULTRA TEST test strip  03/31/17   [provider]  rosuvastatin (CRESTOR) 20 MG tablet Take 20 mg by mouth daily.    [provider]  saxagliptin HCl (ONGLYZA) 5 MG TABS tablet Take 5 mg by mouth daily.    [provider]  sennosides-docusate sodium (SENOKOT-S) 8.6-50 MG tablet Take 1 tablet by mouth daily.    [provider]  tamsulosin (FLOMAX) 0.4 MG CAPS capsule Take 0.4 mg by mouth daily.      [provider]  tiZANidine (ZANAFLEX) 4 MG tablet Take 4 mg by mouth at bedtime.     [provider]    Family History Family History  Problem Relation Age of Onset  . Cancer Brother   . Cancer Sister     Social History Social History   Tobacco Use  . Smoking status: Current Every Day Smoker    Packs/day: 0.25    Last attempt to quit: 08/18/2016    Years since quitting: 1.6  . Smokeless tobacco: Never Used  . Tobacco comment: smoke 3 to 4 cigarettes a day, 09/08/16 quit 3 wks ago  Substance Use Topics  . Alcohol use: No    Alcohol/week: 0.0 standard drinks    Comment: quit 25 years ago though before "drank so much that you couldn't tell b/t alcohol and wine"  . Drug use: No     Allergies   Patient has no known allergies.   Review of Systems Review of Systems Ten systems reviewed and are negative for acute change, except as noted in the HPI.    Physical Exam Updated Vital Signs BP (!) 149/76   Pulse (!) 57   Resp 15   Ht 5\' 7"  (1.702 m)   Wt 89.8 kg   SpO2 100%   BMI 31.01 kg/m   Physical Exam Vitals signs and nursing note reviewed.  Constitutional:      General: He is not in acute distress.    Appearance: He is well-developed. He is not diaphoretic.  HENT:     Head: Normocephalic and atraumatic.  Eyes:     General: No scleral icterus.    Conjunctiva/sclera: Conjunctivae normal.  Neck:     Musculoskeletal: Normal range of motion and neck supple.  Cardiovascular:     Rate and Rhythm: Normal rate and regular rhythm.     Heart sounds: Normal heart sounds.  Pulmonary:     Effort: Pulmonary effort is normal. No respiratory distress.     Breath sounds: Normal breath sounds.  Abdominal:     Palpations: Abdomen is soft.     Tenderness: There is no abdominal tenderness.  Musculoskeletal:        General: No swelling or tenderness.     Right lower leg: No edema.     Left lower leg: No edema.     Comments: No obvious bruising deformity of  the upper or lower extremities.  Full range of motion and strength of all joints.  Skin:    General: Skin is warm and dry.  Neurological:     Mental Status: He is alert.  Psychiatric:        Behavior: Behavior normal.      ED Treatments / Results  Labs (all labs ordered are listed, but only abnormal results are displayed) Labs Reviewed  BASIC METABOLIC PANEL  CBC  TROPONIN I  I-STAT TROPONIN, ED  CBG MONITORING, ED    EKG EKG Interpretation  Date/Time:  Friday April 16 2018 08:55:15 EST Ventricular Rate:  54 PR Interval:    QRS Duration: 125 QT Interval:  402 QTC Calculation: 381 R Axis:   4 Text Interpretation:  Sinus rhythm Prolonged PR interval Nonspecific intraventricular conduction delay Baseline wander in lead(s) V2 Confirmed by Virgel Manifold [phone]) on 04/16/2018 10:11:41 AM   Radiology No results found.  Procedures Procedures (including critical care time)  Medications Ordered in ED Medications - No data to display   Initial Impression / Assessment and Plan / ED Course  I have reviewed the triage vital signs and the nursing notes.  Pertinent labs & imaging results that were available during my care of the patient were reviewed by me and considered in my medical decision making (see chart for details).     78 year old male who presents with fall, fleeting atypical sounding chest pain.  Fall was mechanical.  He had no reported loss of consciousness, no visible injuries.  Although the patient did report chest pain and has known coronary artery disease his story sounds very atypical for ACS.  Has no active reproducible chest wall pain.  Patient's EKG shows no acute ischemic changes.  His troponin is negative.  Chest x-ray shows no acute abnormalities.  He is afebrile, normal respiratory rate.  Creatinine at baseline.  His hemoglobin is slightly low however stable.  Patient appears appropriate for discharge at this time.  He may may take Tylenol for his  symptoms.  Is advised to follow-up with his primary care physician within the next 5 to 7 days.  Discussed return precautions with the patient.  Seen and shared visit with Dr. Wilson Singer who agrees with work-up and plan for discharge.  Final Clinical Impressions(s) / ED Diagnoses   Final diagnoses:  None    ED Discharge Orders    None       Margarita Mail, PA-C 04/16/18 1046    Virgel Manifold, MD 04/22/18 1211

## 2018-05-27 ENCOUNTER — Emergency Department (HOSPITAL_COMMUNITY): Payer: Medicare Other

## 2018-05-27 ENCOUNTER — Encounter (HOSPITAL_COMMUNITY): Payer: Self-pay

## 2018-05-27 ENCOUNTER — Emergency Department (HOSPITAL_COMMUNITY)
Admission: EM | Admit: 2018-05-27 | Discharge: 2018-05-27 | Disposition: A | Discharge status: Home / Self Care | Payer: Medicare Other | Attending: Emergency Medicine | Admitting: Emergency Medicine

## 2018-05-27 DIAGNOSIS — Z7982 Long term (current) use of aspirin: Secondary | ICD-10-CM | POA: Diagnosis not present

## 2018-05-27 DIAGNOSIS — E785 Hyperlipidemia, unspecified: Secondary | ICD-10-CM | POA: Diagnosis not present

## 2018-05-27 DIAGNOSIS — Z8673 Personal history of transient ischemic attack (TIA), and cerebral infarction without residual deficits: Secondary | ICD-10-CM | POA: Insufficient documentation

## 2018-05-27 DIAGNOSIS — G8929 Other chronic pain: Secondary | ICD-10-CM

## 2018-05-27 DIAGNOSIS — I10 Essential (primary) hypertension: Secondary | ICD-10-CM | POA: Diagnosis not present

## 2018-05-27 DIAGNOSIS — E119 Type 2 diabetes mellitus without complications: Secondary | ICD-10-CM | POA: Diagnosis not present

## 2018-05-27 DIAGNOSIS — M546 Pain in thoracic spine: Secondary | ICD-10-CM | POA: Insufficient documentation

## 2018-05-27 DIAGNOSIS — Z79899 Other long term (current) drug therapy: Secondary | ICD-10-CM | POA: Diagnosis not present

## 2018-05-27 DIAGNOSIS — F17200 Nicotine dependence, unspecified, uncomplicated: Secondary | ICD-10-CM | POA: Insufficient documentation

## 2018-05-27 DIAGNOSIS — M545 Low back pain, unspecified: Secondary | ICD-10-CM

## 2018-05-27 DIAGNOSIS — Z8546 Personal history of malignant neoplasm of prostate: Secondary | ICD-10-CM | POA: Diagnosis not present

## 2018-05-27 MED ORDER — IBUPROFEN 400 MG PO TABS
600.0000 mg | ORAL_TABLET | Freq: Once | ORAL | Status: AC
  Administered 2018-05-27: 600 mg via ORAL
  Filled 2018-05-27: qty 1

## 2018-05-27 MED ORDER — CYCLOBENZAPRINE HCL 10 MG PO TABS: 10.0000 mg | ORAL_TABLET | Freq: Three times a day (TID) | ORAL | 0 refills | Status: DC | PRN

## 2018-05-27 MED ORDER — ACETAMINOPHEN 500 MG PO TABS
1000.0000 mg | ORAL_TABLET | Freq: Once | ORAL | Status: AC
  Administered 2018-05-27: 1000 mg via ORAL
  Filled 2018-05-27: qty 2

## 2018-05-27 NOTE — ED Triage Notes (Signed)
Pt arrives POV for mid back pain xyears. Pt endorses current at home medicine not alleviating pain. Pt alert, oriented, and ambulatory.

## 2018-05-27 NOTE — ED Notes (Signed)
Patient transported to X-ray 

## 2018-05-27 NOTE — ED Notes (Signed)
ED Provider at bedside. 

## 2018-05-27 NOTE — ED Notes (Signed)
Pt ambulatory to restroom independently.

## 2018-05-27 NOTE — ED Provider Notes (Signed)
[facility] EMERGENCY DEPARTMENT Provider Note   CSN: [id number] Arrival date & time: 05/27/18  1243    History   Chief Complaint Chief Complaint  Patient presents with  . Back Pain    HPI Peter Mann is a 78 y.o. male.     HPI Patient is a 78 year old male with a history of prostate cancer as well as recent fall several weeks ago presents the emergency department with mid thoracic and upper lumbar pain.  He states he has had this pain in his back for over 2 years but is currently unrelieved by the Voltaren gel and Tylenol prescribed by his physician.  He does report a fall 2 weeks ago reports the pain is not significantly changed since then.  Denies abdominal pain.  No chest pain.  No fevers or chills.  Denies weakness of his arms or legs.  He states his pain is worse with movement and bending over.  Denies significant weight changes   Past Medical History:  Diagnosis Date  . Cancer ([place])    prostrate ca, radiation treatments  . Chest pain   . Diabetes mellitus without complication ([place])   . Dizziness   . History of tobacco abuse   . Hyperlipidemia   . Hypertension   . Stroke [facility])     Patient Active Problem List   Diagnosis Date Noted  . Smoker 03/17/2016  . Facial palsy   . Acute CVA (cerebrovascular accident) ([place])   . HLD (hyperlipidemia)   . Diabetes mellitus type 2, noninsulin dependent ([place])   . CVA (cerebral vascular accident) ([place]) 08/23/2015  . Abdominal distention   . Gangrenous cholecystitis   . Ileus ([place])   . Abdominal pain 01/28/2014  . Cholelithiasis 01/28/2014  . Cholecystitis 01/28/2014  . Symptomatic cholelithiasis 01/28/2014  . Hyperglycemia 01/28/2014  . Chest pain 02/01/2013  . Essential hypertension 02/01/2013  . Hyperlipidemia 02/01/2013    Past Surgical History:  Procedure Laterality Date  . CHOLECYSTECTOMY N/A 01/30/2014   Procedure: LAPAROSCOPIC CHOLECYSTECTOMY;  Surgeon: Ralene Ok, MD;  Location: [place];  Service: General;  Laterality: N/A;        Home Medications    Prior to Admission medications   Medication Sig Start Date End Date Taking? Authorizing Provider  acetaminophen (TYLENOL) 500 MG tablet Take 1,000 mg by mouth every 6 (six) hours as needed for mild pain or headache.    [provider]  amLODipine (NORVASC) 10 MG tablet Take 10 mg by mouth daily.    [provider]  aspirin 325 MG tablet Take 1 tablet (325 mg total) by mouth daily. 08/27/15   Florencia Reasons, MD  cycloSPORINE (RESTASIS) 0.05 % ophthalmic emulsion Place 1 drop into the left eye daily.     [provider]  dexlansoprazole (DEXILANT) 60 MG capsule Take 60 mg by mouth daily.    [provider]  diclofenac sodium (VOLTAREN) 1 % GEL Apply 2 g topically as needed (pain).     [provider]  magnesium hydroxide (MILK OF MAGNESIA) 400 MG/5ML suspension Take 60 mLs by mouth daily as needed for mild constipation.     [provider]  meclizine (ANTIVERT) 25 MG tablet Take 25 mg by mouth 2 (two) times daily.    [provider]  metoprolol (LOPRESSOR) 50 MG tablet Take 75 mg by mouth 2 (two) times daily.    [provider]  ONE TOUCH ULTRA TEST test strip  03/31/17   [provider]  Plecanatide (TRULANCE) 3 MG TABS Take 3 mg by mouth daily.    [provider]  rosuvastatin (CRESTOR) 20 MG tablet Take 20 mg by mouth daily.    [provider]  saxagliptin HCl (ONGLYZA) 5 MG TABS tablet Take 5 mg by mouth daily.    [provider]  sennosides-docusate sodium (SENOKOT-S) 8.6-50 MG tablet Take 1 tablet by mouth daily.    [provider]  tamsulosin (FLOMAX) 0.4 MG CAPS capsule Take 0.4 mg by mouth daily.     [provider]  tiZANidine (ZANAFLEX) 4 MG tablet Take 4 mg by mouth at bedtime.     [provider]    Family History Family History  Problem Relation Age of Onset  . Cancer Brother   .  Cancer Sister     Social History Social History   Tobacco Use  . Smoking status: Current Every Day Smoker    Packs/day: 0.25    Last attempt to quit: 08/18/2016    Years since quitting: 1.7  . Smokeless tobacco: Never Used  . Tobacco comment: smoke 3 to 4 cigarettes a day, 09/08/16 quit 3 wks ago  Substance Use Topics  . Alcohol use: No    Alcohol/week: 0.0 standard drinks    Comment: quit 25 years ago though before "drank so much that you couldn't tell b/t alcohol and wine"  . Drug use: No     Allergies   Patient has no known allergies.   Review of Systems Review of Systems  All other systems reviewed and are negative.    Physical Exam Updated Vital Signs BP (!) 169/104   Pulse 71   Temp 97.7 F (36.5 C) (Oral)   Resp 16   Ht 5\' 7"  (1.702 m)   Wt 90.7 kg   SpO2 100%   BMI 31.32 kg/m   Physical Exam Vitals signs and nursing note reviewed.  Constitutional:      Appearance: He is well-developed.  HENT:     Head: Normocephalic and atraumatic.  Neck:     Musculoskeletal: Normal range of motion.  Cardiovascular:     Rate and Rhythm: Normal rate and regular rhythm.     Heart sounds: Normal heart sounds.  Pulmonary:     Effort: Pulmonary effort is normal. No respiratory distress.     Breath sounds: Normal breath sounds.  Abdominal:     General: There is no distension.     Palpations: Abdomen is soft.     Tenderness: There is no abdominal tenderness.  Musculoskeletal: Normal range of motion.     Comments: Marland Kitchen  Mild thoracic and parathoracic tenderness as well as lumbar and paralumbar tenderness without thoracic or lumbar step-offAmbulatory.  5 out of 5 strength of bilateral lower extremity major muscle groups  Skin:    General: Skin is warm and dry.  Neurological:     Mental Status: He is alert and oriented to person, place, and time.  Psychiatric:        Judgment: Judgment normal.      ED Treatments / Results  Labs (all labs ordered are listed, but only  abnormal results are displayed) Labs Reviewed - No data to display  EKG None  Radiology No results found.  Procedures Procedures (including critical care time)  Medications Ordered in ED Medications  ibuprofen (ADVIL,MOTRIN) tablet 600 mg (600 mg Oral Given 05/27/18 1317)  acetaminophen (TYLENOL) tablet 1,000 mg (1,000 mg Oral Given 05/27/18 1317)     Initial Impression /  Assessment and Plan / ED Course  I have reviewed the triage vital signs and the nursing notes.  Pertinent labs & imaging results that were available during my care of the patient were reviewed by me and considered in my medical decision making (see chart for details).        Likely chronic back pain.  Given his history of cancer as well as his history of recent fall he will undergo thoracic and lumbar plain films at this time.  He has 5 out of 5 strength in his bilateral lower extremity major muscle groups.  Symptom control here in the emergency department.  I suspect the plan will be discharged home with muscle relaxants and primary care follow-up.  We will continue to follow the patient closely while in the emergency department.  Final Clinical Impressions(s) / ED Diagnoses   Final diagnoses:  Chronic bilateral low back pain without sciatica  Chronic bilateral thoracic back pain    ED Discharge Orders         Ordered    cyclobenzaprine (FLEXERIL) 10 MG tablet  3 times daily PRN     05/27/18 1536           Jola Schmidt, MD 05/30/18 1840

## 2018-05-27 NOTE — ED Notes (Signed)
Patient verbalizes understanding of discharge instructions. Opportunity for questioning and answers were provided. Armband removed by staff, pt discharged from ED. Pharmacy reviewed. Pt wheeled to lobby. Prescriptions reviewed.

## 2018-05-27 NOTE — ED Notes (Signed)
Patient transported to CT 

## 2019-02-08 ENCOUNTER — Ambulatory Visit: Payer: Medicare Other | Admitting: Cardiovascular Disease

## 2019-02-17 ENCOUNTER — Encounter: Payer: Self-pay | Admitting: Cardiovascular Disease

## 2019-02-17 ENCOUNTER — Other Ambulatory Visit: Payer: Self-pay

## 2019-02-17 ENCOUNTER — Ambulatory Visit (INDEPENDENT_AMBULATORY_CARE_PROVIDER_SITE_OTHER): Payer: Medicare Other | Admitting: Cardiovascular Disease

## 2019-02-17 VITALS — BP 122/64 | HR 66 | Ht 67.0 in | Wt 203.2 lb

## 2019-02-17 DIAGNOSIS — E782 Mixed hyperlipidemia: Secondary | ICD-10-CM | POA: Diagnosis not present

## 2019-02-17 DIAGNOSIS — I10 Essential (primary) hypertension: Secondary | ICD-10-CM | POA: Diagnosis not present

## 2019-02-17 DIAGNOSIS — F17200 Nicotine dependence, unspecified, uncomplicated: Secondary | ICD-10-CM

## 2019-02-17 NOTE — Patient Instructions (Signed)

## 2019-02-17 NOTE — Progress Notes (Signed)
02/17/2019 Peter Mann   11-23-1940  [id number]  Primary Physician Nolene Ebbs, MD Primary Cardiologist: Lorretta Harp MD Lupe Carney, [place]  HPI:  Peter Mann is a 78 y.o.  mildly overweight separated African American male father of 2 children referred by Dr. Pierce Crane for cardiovascular evaluation because of chest pain. I last saw him in the office  02/03/2018. He is retired from working [employer], [employer]. His cardiac risk factor profile is positive for 100-pack-year history of tobacco abuse still continuing to smoke off and on.History of hypertension and dyslipidemia. He also has a history of prostate cancer. Because of chest pain several years ago he underwent Myoview stress testing which was normal at that time (03/11/13). He was recently admitted with a stroke 08/23/15. CT scan showed acute infarct of the right posterior frontal lobe. His aspirin was increased to full dose and he was scheduled for a TEE and loop recorder implantation however he left prior to having this performed.  Since I saw him a year ago he is remained stable.  He continues to smoke although infrequently.  He denies chest pain or shortness of breath.  Current Meds  Medication Sig  . acetaminophen (TYLENOL) 500 MG tablet Take 1,000 mg by mouth every 6 (six) hours as needed for mild pain or headache.  Marland Kitchen amLODipine (NORVASC) 10 MG tablet Take 10 mg by mouth daily.  Marland Kitchen aspirin 325 MG tablet Take 1 tablet (325 mg total) by mouth daily.  . cyclobenzaprine (FLEXERIL) 10 MG tablet Take 1 tablet (10 mg total) by mouth 3 (three) times daily as needed for muscle spasms.  . cycloSPORINE (RESTASIS) 0.05 % ophthalmic emulsion Place 1 drop into the left eye daily.   Marland Kitchen dexlansoprazole (DEXILANT) 60 MG capsule Take 60 mg by mouth daily.  . diclofenac sodium (VOLTAREN) 1 % GEL Apply 2 g topically as needed (pain).   . lubiprostone (AMITIZA) 24 MCG capsule Take 24 mcg by mouth 2 (two) times daily with a meal.   . meclizine (ANTIVERT) 25 MG tablet Take 25 mg by mouth 2 (two) times daily.  . metoprolol (LOPRESSOR) 50 MG tablet Take 75 mg by mouth 2 (two) times daily.  . ONE TOUCH ULTRA TEST test strip   . polyethylene glycol (MIRALAX / GLYCOLAX) 17 g packet Take 17 g by mouth daily.  . rosuvastatin (CRESTOR) 20 MG tablet Take 20 mg by mouth daily.  . saxagliptin HCl (ONGLYZA) 5 MG TABS tablet Take 5 mg by mouth daily.  . tamsulosin (FLOMAX) 0.4 MG CAPS capsule Take 0.4 mg by mouth daily.   Marland Kitchen tiZANidine (ZANAFLEX) 4 MG tablet Take 4 mg by mouth at bedtime.      No Known Allergies  Social History   Socioeconomic History  . Marital status: Legally Separated    Spouse name: Not on file  . Number of children: 3  . Years of education: 7  . Highest education level: Not on file  Occupational History  . Not on file  Tobacco Use  . Smoking status: Former Smoker    Packs/day: 0.25    Quit date: 11/19/2018    Years since quitting: 0.2  . Smokeless tobacco: Never Used  . Tobacco comment: smoke 3 to 4 cigarettes a day, 09/08/16 quit 3 wks ago  Substance and Sexual Activity  . Alcohol use: No    Alcohol/week: 0.0 standard drinks    Comment: quit 25 years ago though before "drank so much that you  couldn't tell b/t alcohol and wine"  . Drug use: No  . Sexual activity: Never  Other Topics Concern  . Not on file  Social History Narrative   Lives alone   Social Determinants of Health   Financial Resource Strain:   . Difficulty of Paying Living Expenses: Not on file  Food Insecurity:   . Worried About Charity fundraiser in the Last Year: Not on file  . Ran Out of Food in the Last Year: Not on file  Transportation Needs:   . Lack of Transportation (Medical): Not on file  . Lack of Transportation (Non-Medical): Not on file  Physical Activity:   . Days of Exercise per Week: Not on file  . Minutes of Exercise per Session: Not on file  Stress:   . Feeling of Stress : Not on file  Social  Connections:   . Frequency of Communication with Friends and Family: Not on file  . Frequency of Social Gatherings with Friends and Family: Not on file  . Attends Religious Services: Not on file  . Active Member of Clubs or Organizations: Not on file  . Attends Archivist Meetings: Not on file  . Marital Status: Not on file  Intimate Partner Violence:   . Fear of Current or Ex-Partner: Not on file  . Emotionally Abused: Not on file  . Physically Abused: Not on file  . Sexually Abused: Not on file     Review of Systems: General: negative for chills, fever, night sweats or weight changes.  Cardiovascular: negative for chest pain, dyspnea on exertion, edema, orthopnea, palpitations, paroxysmal nocturnal dyspnea or shortness of breath Dermatological: negative for rash Respiratory: negative for cough or wheezing Urologic: negative for hematuria Abdominal: negative for nausea, vomiting, diarrhea, bright red blood per rectum, melena, or hematemesis Neurologic: negative for visual changes, syncope, or dizziness All other systems reviewed and are otherwise negative except as noted above.    Blood pressure 122/64, pulse 66, height 5\' 7"  (1.702 m), weight 203 lb 3.2 oz (92.2 kg).  General appearance: alert and no distress Neck: no adenopathy, no carotid bruit, no JVD, supple, symmetrical, trachea midline and thyroid not enlarged, symmetric, no tenderness/mass/nodules Lungs: clear to auscultation bilaterally Heart: regular rate and rhythm, S1, S2 normal, no murmur, click, rub or gallop Extremities: extremities normal, atraumatic, no cyanosis or edema Pulses: 2+ and symmetric Skin: Skin color, texture, turgor normal. No rashes or lesions Neurologic: Alert and oriented X 3, normal strength and tone. Normal symmetric reflexes. Normal coordination and gait  EKG sinus rhythm at 66 with nonspecific ST and T wave changes. I  Personally reviewed this EKG.  ASSESSMENT AND PLAN:    Essential hypertension History of essential hypertension with blood pressure measured today 122/64 he is on amlodipine and metoprolol.  Hyperlipidemia History of hyperlipidemia on statin therapy followed by his PCP.  Smoker History of ongoing tobacco abuse recalcitrant risk factor modification.      Lorretta Harp MD FACP,FACC,FAHA, [facility] 02/17/2019 2:25 PM

## 2019-02-17 NOTE — Assessment & Plan Note (Signed)
History of ongoing tobacco abuse recalcitrant risk factor modification 

## 2019-02-17 NOTE — Assessment & Plan Note (Signed)
History of essential hypertension with blood pressure measured today 122/64 he is on amlodipine and metoprolol.

## 2019-02-17 NOTE — Assessment & Plan Note (Signed)
History of hyperlipidemia on statin therapy followed by his PCP 

## 2019-05-26 IMAGING — DX DG CHEST 2V
2 series · 2 of 2 positions shown · non-contrast
Comparison: 03/22/2004

CLINICAL DATA: Left-sided chest pain for 1 year

EXAM:
CHEST - 2 VIEW

[w chest lat]
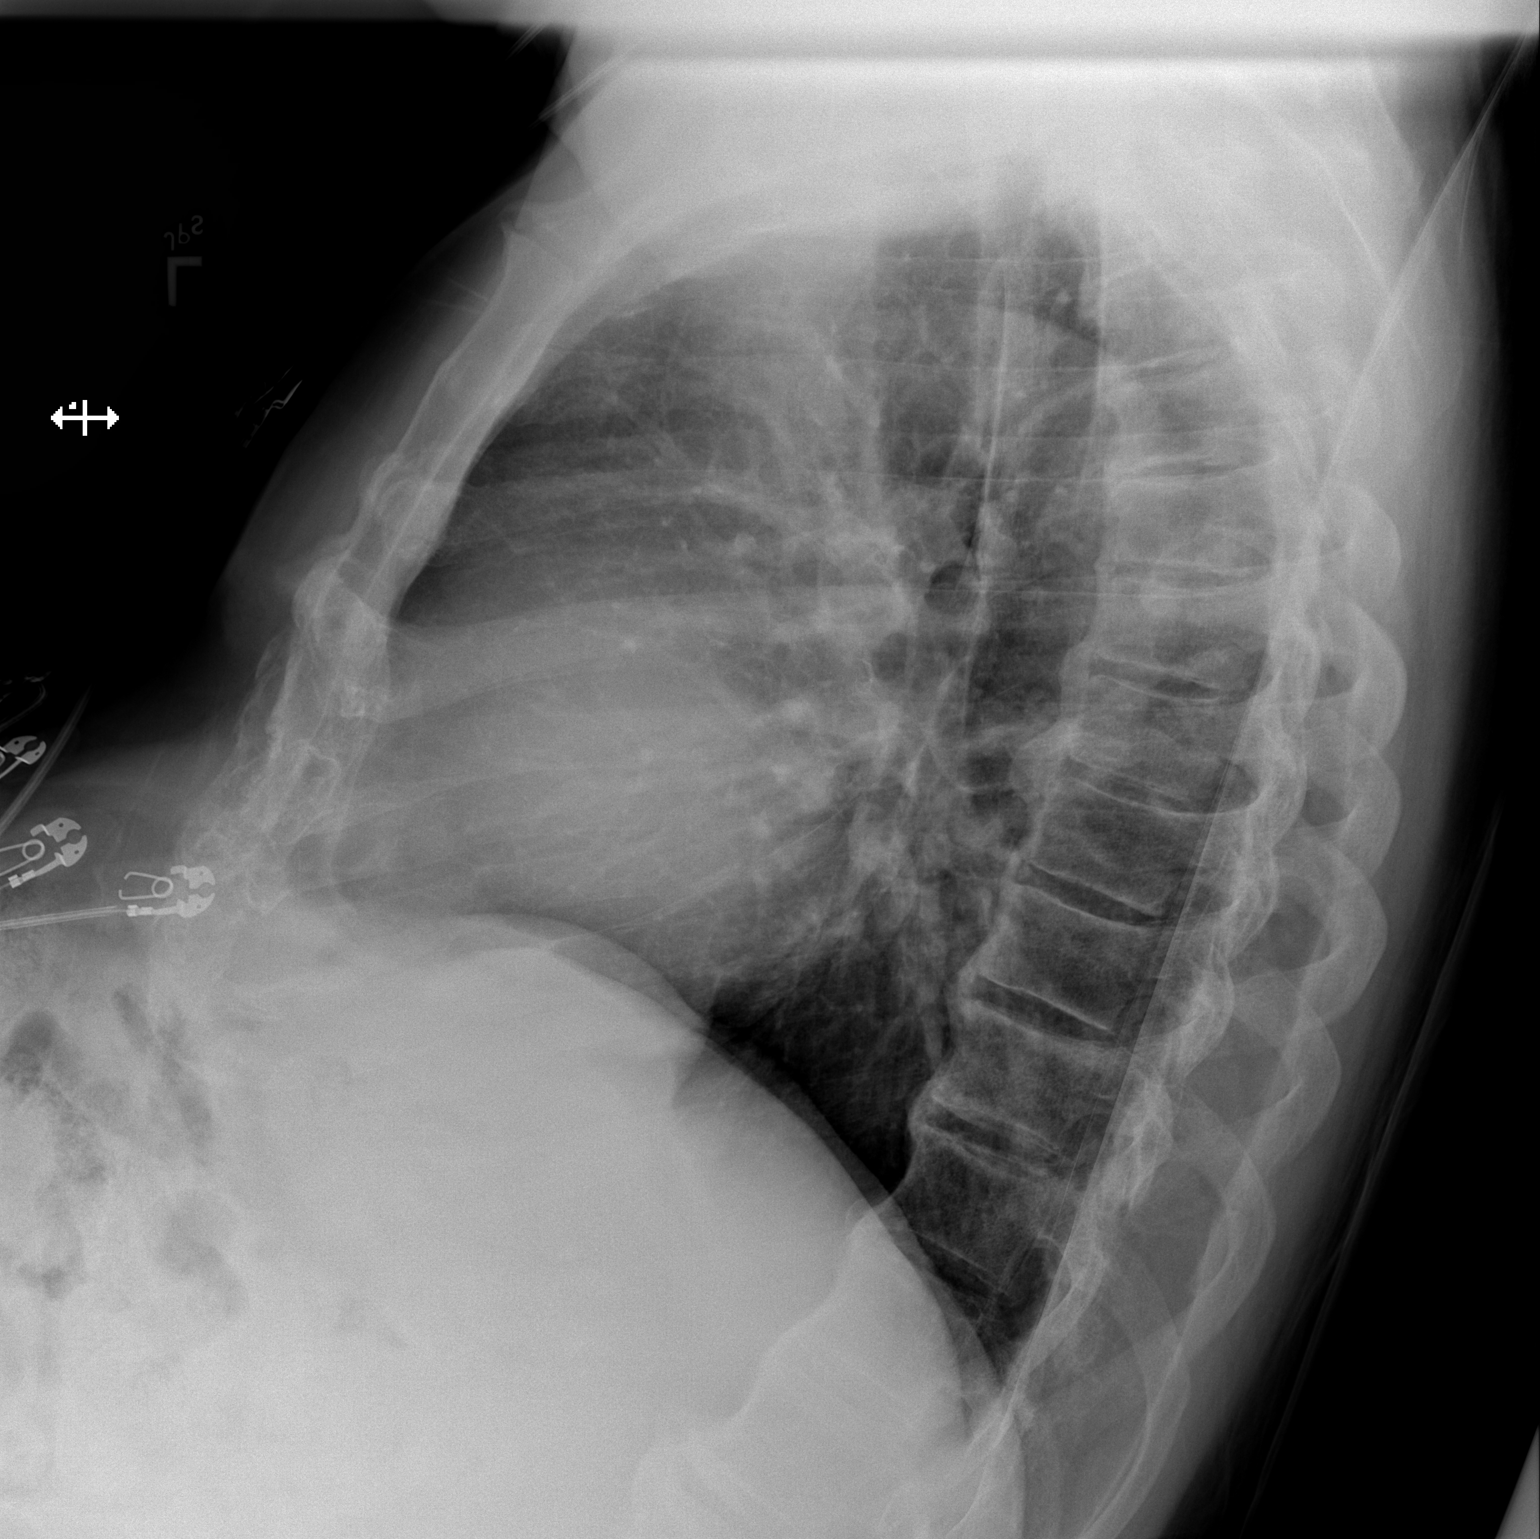

[x chest ap]
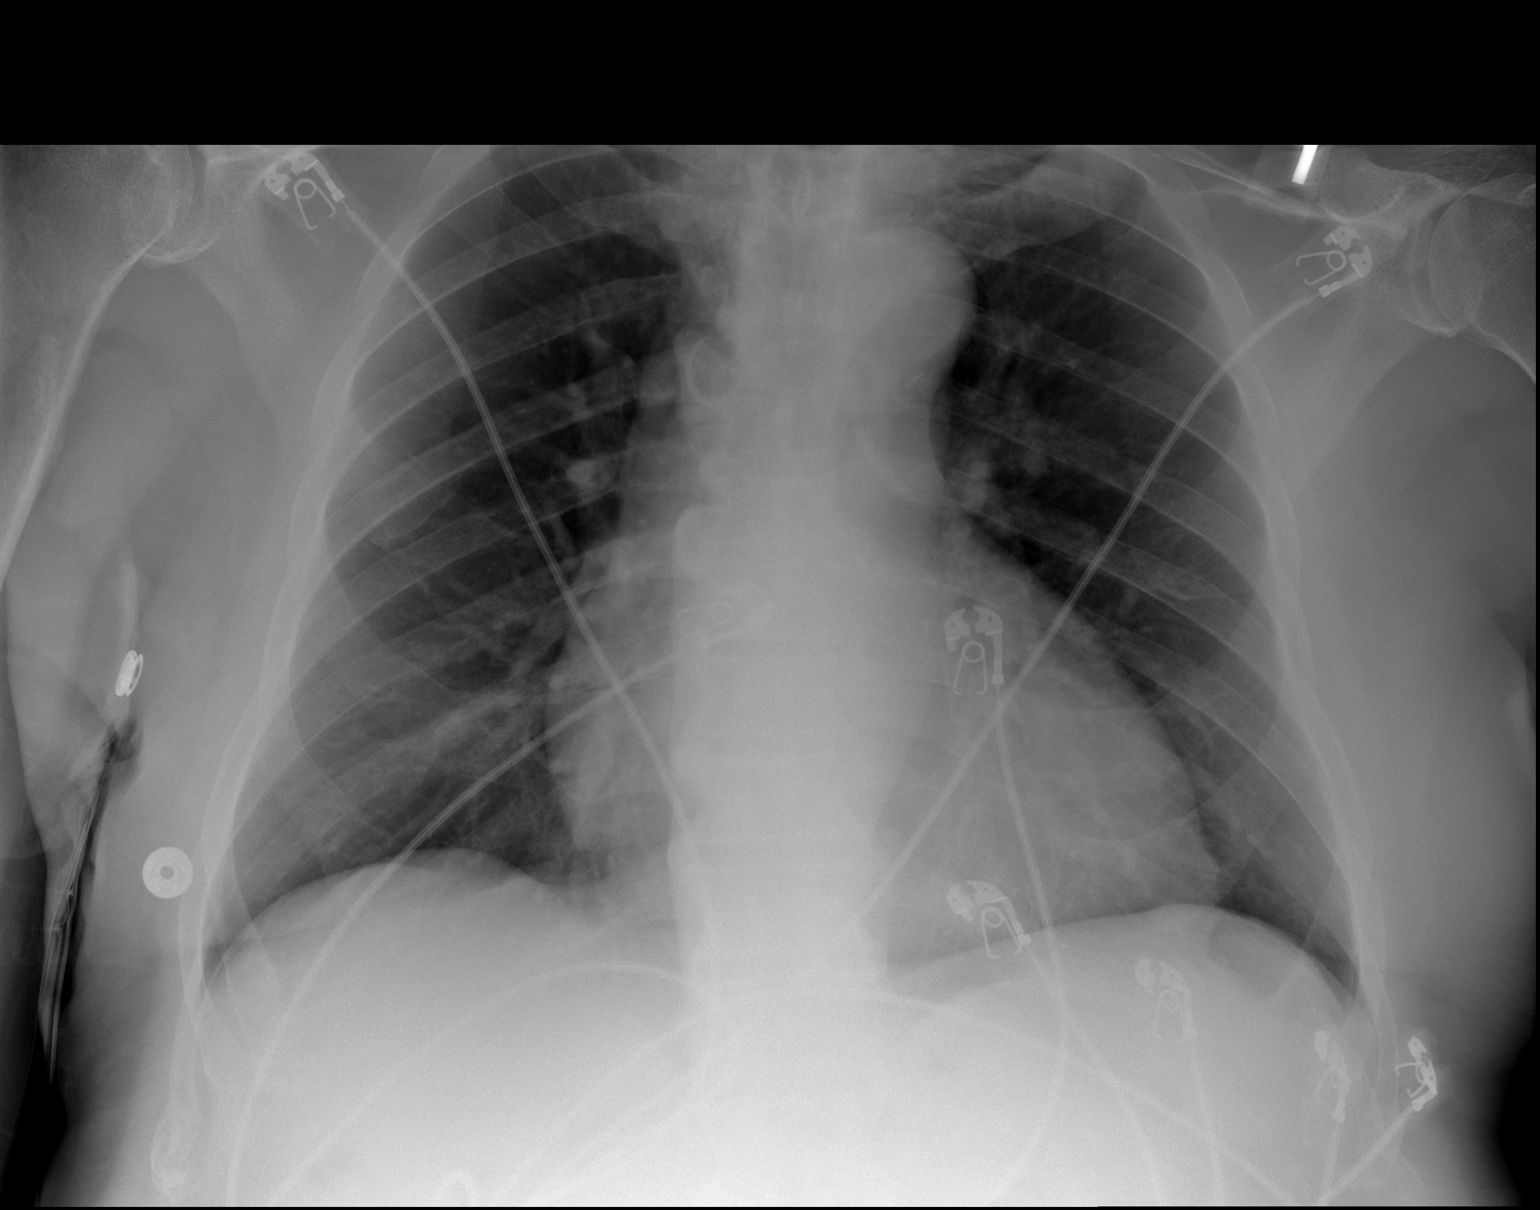

[2 of 2 positions shown; findings below may reference images not displayed]

FINDINGS: Cardiac shadow is mildly enlarged but accentuated by the frontal
technique. The lungs are well aerated bilaterally. No focal
infiltrate or sizable effusion is seen. Degenerative changes of the
thoracic spine are noted.
IMPRESSION: No acute abnormality noted.

## 2019-06-13 ENCOUNTER — Ambulatory Visit: Payer: Medicare Other | Admitting: Podiatry

## 2019-06-30 ENCOUNTER — Ambulatory Visit: Payer: Medicare Other | Admitting: Podiatry

## 2019-07-11 ENCOUNTER — Other Ambulatory Visit: Payer: Self-pay

## 2019-07-11 ENCOUNTER — Encounter (INDEPENDENT_AMBULATORY_CARE_PROVIDER_SITE_OTHER): Payer: Self-pay | Admitting: Ophthalmology

## 2019-07-11 ENCOUNTER — Ambulatory Visit (INDEPENDENT_AMBULATORY_CARE_PROVIDER_SITE_OTHER): Payer: Medicare Other | Admitting: Ophthalmology

## 2019-07-11 DIAGNOSIS — H43812 Vitreous degeneration, left eye: Secondary | ICD-10-CM

## 2019-07-11 DIAGNOSIS — H35722 Serous detachment of retinal pigment epithelium, left eye: Secondary | ICD-10-CM | POA: Insufficient documentation

## 2019-07-11 DIAGNOSIS — H353111 Nonexudative age-related macular degeneration, right eye, early dry stage: Secondary | ICD-10-CM | POA: Diagnosis not present

## 2019-07-11 DIAGNOSIS — H43811 Vitreous degeneration, right eye: Secondary | ICD-10-CM | POA: Insufficient documentation

## 2019-07-11 DIAGNOSIS — H353221 Exudative age-related macular degeneration, left eye, with active choroidal neovascularization: Secondary | ICD-10-CM | POA: Diagnosis not present

## 2019-07-11 MED ORDER — BEVACIZUMAB CHEMO INJECTION 1.25MG/0.05ML SYRINGE FOR KALEIDOSCOPE
1.2500 mg | INTRAVITREAL | Status: AC | PRN
  Administered 2019-07-11: 09:00:00 1.25 mg via INTRAVITREAL

## 2019-07-11 NOTE — Assessment & Plan Note (Signed)
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  Forms of therapy reviewed include the use of Anti-VEGF medications injected painlessly into the eye, as well as other possible treatment modalities, including thermal laser therapy. Fellow eye involvement and risks were discussed with the patient. Upon the finding of wet age related macular degeneration, treatment will be offered. The treatment regimen is on a treat as needed basis with the intent to treat if necessary and extend interval of exams when possible. On average 1 out of 6 patients do not need lifetime therapy. However, the risk of recurrent disease is high for a lifetime.  Initially monthly, then periodic, examinations and evaluations will determine whether the next treatment is required on the day of the examination.  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  Stable on 5-week evaluation

## 2019-07-11 NOTE — Assessment & Plan Note (Signed)

## 2019-07-11 NOTE — Progress Notes (Signed)
07/11/2019     CHIEF COMPLAINT Patient presents for Retina Follow Up   HISTORY OF PRESENT ILLNESS: Peter Mann is a 79 y.o. male who presents to the clinic today for:   HPI    Retina Follow Up    Patient presents with  Wet AMD.  In left eye.  Duration of 5 weeks.  Since onset it is stable.          Comments    5 week follow up - OCT OU, Possible Avastin OS Patient denies change in vision and overall has no complaints.         Last edited by Gerda Diss on 07/11/2019  8:19 AM. (History)      Referring physician: Nolene Ebbs, MD [place],  [place] [address]  HISTORICAL INFORMATION:   Selected notes from the MEDICAL RECORD NUMBER    Lab Results  Component Value Date   HGBA1C 8.5 (H) 08/23/2015     CURRENT MEDICATIONS: Current Outpatient Medications (Ophthalmic Drugs)  Medication Sig  . cycloSPORINE (RESTASIS) 0.05 % ophthalmic emulsion Place 1 drop into the left eye daily.    No current facility-administered medications for this visit. (Ophthalmic Drugs)   Current Outpatient Medications (Other)  Medication Sig  . acetaminophen (TYLENOL) 500 MG tablet Take 1,000 mg by mouth every 6 (six) hours as needed for mild pain or headache.  Marland Kitchen amLODipine (NORVASC) 10 MG tablet Take 10 mg by mouth daily.  Marland Kitchen aspirin 325 MG tablet Take 1 tablet (325 mg total) by mouth daily.  . cyclobenzaprine (FLEXERIL) 10 MG tablet Take 1 tablet (10 mg total) by mouth 3 (three) times daily as needed for muscle spasms.  Marland Kitchen dexlansoprazole (DEXILANT) 60 MG capsule Take 60 mg by mouth daily.  . diclofenac sodium (VOLTAREN) 1 % GEL Apply 2 g topically as needed (pain).   . lubiprostone (AMITIZA) 24 MCG capsule Take 24 mcg by mouth 2 (two) times daily with a meal.  . meclizine (ANTIVERT) 25 MG tablet Take 25 mg by mouth 2 (two) times daily.  . metoprolol (LOPRESSOR) 50 MG tablet Take 75 mg by mouth 2 (two) times daily.  . ONE TOUCH ULTRA TEST test strip   . polyethylene  glycol (MIRALAX / GLYCOLAX) 17 g packet Take 17 g by mouth daily.  . rosuvastatin (CRESTOR) 20 MG tablet Take 20 mg by mouth daily.  . saxagliptin HCl (ONGLYZA) 5 MG TABS tablet Take 5 mg by mouth daily.  . tamsulosin (FLOMAX) 0.4 MG CAPS capsule Take 0.4 mg by mouth daily.   Marland Kitchen tiZANidine (ZANAFLEX) 4 MG tablet Take 4 mg by mouth at bedtime.    No current facility-administered medications for this visit. (Other)      REVIEW OF SYSTEMS:    ALLERGIES No Known Allergies  PAST MEDICAL HISTORY Past Medical History:  Diagnosis Date  . 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 Chest pain   . Diabetes mellitus without complication ([place])   . Dizziness   . History of tobacco abuse   . Hyperlipidemia   . Hypertension   . Stroke [facility])    Past Surgical History:  Procedure Laterality Date  . CHOLECYSTECTOMY N/A 01/30/2014   Procedure: LAPAROSCOPIC CHOLECYSTECTOMY;  Surgeon: Ralene Ok, MD;  Location: [facility] OR;  Service: General;  Laterality: N/A;    FAMILY HISTORY Family History  Problem Relation Age of Onset  . Cancer Brother   . Cancer Sister     SOCIAL HISTORY Social  History   Tobacco Use  . Smoking status: Former Smoker    Packs/day: 0.25    Quit date: 11/19/2018    Years since quitting: 0.6  . Smokeless tobacco: Never Used  . Tobacco comment: smoke 3 to 4 cigarettes a day, 09/08/16 quit 3 wks ago  Substance Use Topics  . Alcohol use: No    Alcohol/week: 0.0 standard drinks    Comment: quit 25 years ago though before "drank so much that you couldn't tell b/t alcohol and wine"  . Drug use: No         OPHTHALMIC EXAM:  Base Eye Exam    Visual Acuity (Snellen - Linear)      Right Left   Dist [place] 20/30-2 20/100   Dist ph [place] NI 20/50-2       Tonometry (Tonopen, 8:23 AM)      Right Left   Pressure 15 17       Pupils      Pupils Dark Light Shape React APD   Right PERRL 2 2 Round Minimal None   Left PERRL 2 2 Round Minimal None       Visual  Fields (Counting fingers)      Left Right    Full Full       Extraocular Movement      Right Left    Full Full       Neuro/Psych    Oriented x3: Yes   Mood/Affect: Normal       Dilation    Left eye: 1.0% Mydriacyl, 2.5% Phenylephrine @ 8:24 AM        Slit Lamp and Fundus Exam    External Exam      Right Left   External Normal Normal       Slit Lamp Exam      Right Left   Lids/Lashes Normal Normal   Conjunctiva/Sclera White and quiet White and quiet   Cornea Clear Clear   Anterior Chamber Deep and quiet Deep and quiet   Iris Round and reactive Round and reactive   Lens 3+ Nuclear sclerosis 3+ Nuclear sclerosis   Anterior Vitreous Normal Normal       Fundus Exam      Right Left   Posterior Vitreous  Posterior vitreous detachment   Disc  Normal   C/D Ratio  0.5   Macula  Age related macular degeneration, Macular thickening, Hard drusen, Retinal pigment epithelial detachment   Vessels  Normal   Periphery  Normal          IMAGING AND PROCEDURES  Imaging and Procedures for 07/11/19  OCT, Retina - OU - Both Eyes       Right Eye Quality was good. Central Foveal Thickness: 259. Progression has been stable. Findings include normal observations.   Left Eye Quality was good. Scan locations included subfoveal. Central Foveal Thickness: 336. Progression has been stable. Findings include abnormal foveal contour, subretinal fluid, pigment epithelial detachment.   Notes OD, stable with no active maculopathy.,  PVD.  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  Stable on 5-week evaluation       Intravitreal Injection, Pharmacologic Agent - OS - Left Eye       Time Out 07/11/2019. 9:08 AM. Confirmed correct patient, procedure, site, and patient consented.   Anesthesia Topical anesthesia was used. Anesthetic medications included [place] 3.5%.   Procedure Preparation included  Ofloxacin , 10% betadine  to eyelids, 5% betadine to ocular surface. A 30 gauge needle was used.   Injection:  1.25 mg Bevacizumab (AVASTIN) SOLN   NDC: [id number], [id number]   Route: Intravitreal, Site: Left Eye, Waste: 0 mg  Post-op Post injection exam found visual acuity of at least counting fingers. The patient tolerated the procedure well. There were no complications. The patient received written and verbal post procedure care education. Post injection medications were not given.                 ASSESSMENT/PLAN:  Exudative age-related macular degeneration of left eye with active choroidal neovascularization (HCC) The nature of wet macular degeneration was discussed with the patient.  Forms of therapy reviewed include the use of Anti-VEGF medications injected painlessly into the eye, as well as other possible treatment modalities, including thermal laser therapy. Fellow eye involvement and risks were discussed with the patient. Upon the finding of wet age related macular degeneration, treatment will be offered. The treatment regimen is on a treat as needed basis with the intent to treat if necessary and extend interval of exams when possible. On average 1 out of 6 patients do not need lifetime therapy. However, the risk of recurrent disease is high for a lifetime.  Initially monthly, then periodic, examinations and evaluations will determine whether the next treatment is required on the day of the examination.  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  Stable on 5-week evaluation  Posterior vitreous detachment of left eye   The nature of posterior vitreous detachment was discussed with the patient as well as its physiology, its age prevalence, and its possible implication regarding retinal breaks and detachment.  An informational brochure was given to the patient.  All the patient's questions were  answered.  The patient was asked to return if new or different flashes or floaters develops.   Patient was instructed to contact office immediately if any changes were noticed. I explained to the patient that vitreous inside the eye is similar to jello inside a bowl. As the jello melts it can start to pull away from the bowl, similarly the vitreous throughout our lives can begin to pull away from the retina. That process is called a posterior vitreous detachment. In some cases, the vitreous can tug hard enough on the retina to form a retinal tear. I discussed with the patient the signs and symptoms of a retinal detachment.  Do not rub the eye.      ICD-10-CM   1. Exudative age-related macular degeneration of left eye with active choroidal neovascularization (HCC)  H35.3221 OCT, Retina - OU - Both Eyes    Intravitreal Injection, Pharmacologic Agent - OS - Left Eye    Bevacizumab (AVASTIN) SOLN 1.25 mg  2. Serous detachment of retinal pigment epithelium of left eye  H35.722   3. Posterior vitreous detachment of right eye  H43.811   4. 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 Posterior vitreous detachment of left eye  H43.812     1.  2.  3.  Ophthalmic Meds Ordered this visit:  Meds ordered this encounter  Medications  . Bevacizumab (AVASTIN) SOLN 1.25 mg       No follow-ups on file.  There are no Patient Instructions on file for this visit.   Explained the diagnoses, plan, and follow up with the patient and they expressed understanding.  Patient  expressed understanding of the importance of proper follow up care.   Clent Demark Lisa Milian M.D. Diseases & Surgery of the Retina and Vitreous Retina & Diabetic [place] 07/11/19     Abbreviations: M myopia (nearsighted); A astigmatism; H hyperopia (farsighted); P presbyopia; Mrx spectacle prescription;  CTL contact lenses; OD right eye; OS left eye; OU both eyes  XT exotropia; ET esotropia;  PEK punctate epithelial keratitis; PEE punctate epithelial erosions; DES dry eye syndrome; MGD meibomian gland dysfunction; ATs artificial tears; PFAT's preservative free artificial tears; [place] nuclear sclerotic cataract; PSC posterior subcapsular cataract; ERM epi-retinal membrane; PVD posterior vitreous detachment; RD retinal detachment; DM diabetes mellitus; DR diabetic retinopathy; NPDR non-proliferative diabetic retinopathy; PDR proliferative diabetic retinopathy; CSME clinically significant macular edema; DME diabetic macular edema; dbh dot blot hemorrhages; CWS cotton wool spot; POAG primary open angle glaucoma; C/D cup-to-disc ratio; HVF humphrey visual field; GVF goldmann visual field; OCT optical coherence tomography; IOP intraocular pressure; BRVO Branch retinal vein occlusion; CRVO central retinal vein occlusion; CRAO central retinal artery occlusion; BRAO branch retinal artery occlusion; RT retinal tear; SB scleral buckle; PPV pars plana vitrectomy; VH Vitreous hemorrhage; PRP panretinal laser photocoagulation; IVK intravitreal kenalog; VMT vitreomacular traction; MH Macular hole;  NVD neovascularization of the disc; NVE neovascularization elsewhere; AREDS age related eye disease study; ARMD age related macular degeneration; POAG primary open angle glaucoma; EBMD epithelial/anterior basement membrane dystrophy; ACIOL anterior chamber intraocular lens; IOL intraocular lens; PCIOL posterior chamber intraocular lens; Phaco/IOL phacoemulsification with intraocular lens placement; [place] photorefractive keratectomy; LASIK laser assisted in situ keratomileusis; HTN hypertension; DM diabetes mellitus; COPD chronic obstructive pulmonary disease

## 2019-07-18 ENCOUNTER — Other Ambulatory Visit: Payer: Self-pay

## 2019-07-18 ENCOUNTER — Ambulatory Visit (INDEPENDENT_AMBULATORY_CARE_PROVIDER_SITE_OTHER): Payer: Medicare Other | Admitting: Ophthalmology

## 2019-07-18 ENCOUNTER — Encounter (INDEPENDENT_AMBULATORY_CARE_PROVIDER_SITE_OTHER): Payer: Self-pay | Admitting: Ophthalmology

## 2019-07-18 DIAGNOSIS — H353221 Exudative age-related macular degeneration, left eye, with active choroidal neovascularization: Secondary | ICD-10-CM

## 2019-07-18 DIAGNOSIS — H1132 Conjunctival hemorrhage, left eye: Secondary | ICD-10-CM | POA: Insufficient documentation

## 2019-07-18 DIAGNOSIS — H35722 Serous detachment of retinal pigment epithelium, left eye: Secondary | ICD-10-CM

## 2019-07-18 DIAGNOSIS — H43812 Vitreous degeneration, left eye: Secondary | ICD-10-CM

## 2019-07-18 DIAGNOSIS — H43811 Vitreous degeneration, right eye: Secondary | ICD-10-CM

## 2019-07-18 HISTORY — DX: Conjunctival hemorrhage, left eye: H11.32

## 2019-07-18 NOTE — Progress Notes (Signed)
07/18/2019     CHIEF COMPLAINT Patient presents for Eye Exam   HISTORY OF PRESENT ILLNESS: Peter Mann is a 79 y.o. male who presents to the clinic today for:   HPI    Eye Exam    In left eye.  Characterized as no change in [place].  Severity is mild.          Comments    Patient states that he woke up this morning, his eye was red. Patient denies pain. Patient states vision is stable.       Last edited by Gerda Diss on 07/18/2019  1:32 PM. (History)      Referring physician: Nolene Ebbs, MD [place],  [place] [address]  HISTORICAL INFORMATION:   Selected notes from the MEDICAL RECORD NUMBER    Lab Results  Component Value Date   HGBA1C 8.5 (H) 08/23/2015     CURRENT MEDICATIONS: Current Outpatient Medications (Ophthalmic Drugs)  Medication Sig  . cycloSPORINE (RESTASIS) 0.05 % ophthalmic emulsion Place 1 drop into the left eye daily.    No current facility-administered medications for this visit. (Ophthalmic Drugs)   Current Outpatient Medications (Other)  Medication Sig  . acetaminophen (TYLENOL) 500 MG tablet Take 1,000 mg by mouth every 6 (six) hours as needed for mild pain or headache.  Marland Kitchen amLODipine (NORVASC) 10 MG tablet Take 10 mg by mouth daily.  Marland Kitchen aspirin 325 MG tablet Take 1 tablet (325 mg total) by mouth daily.  . cyclobenzaprine (FLEXERIL) 10 MG tablet Take 1 tablet (10 mg total) by mouth 3 (three) times daily as needed for muscle spasms.  Marland Kitchen dexlansoprazole (DEXILANT) 60 MG capsule Take 60 mg by mouth daily.  . diclofenac sodium (VOLTAREN) 1 % GEL Apply 2 g topically as needed (pain).   . lubiprostone (AMITIZA) 24 MCG capsule Take 24 mcg by mouth 2 (two) times daily with a meal.  . meclizine (ANTIVERT) 25 MG tablet Take 25 mg by mouth 2 (two) times daily.  . metoprolol (LOPRESSOR) 50 MG tablet Take 75 mg by mouth 2 (two) times daily.  . ONE TOUCH ULTRA TEST test strip   . polyethylene glycol (MIRALAX / GLYCOLAX) 17 g packet  Take 17 g by mouth daily.  . rosuvastatin (CRESTOR) 20 MG tablet Take 20 mg by mouth daily.  . saxagliptin HCl (ONGLYZA) 5 MG TABS tablet Take 5 mg by mouth daily.  . tamsulosin (FLOMAX) 0.4 MG CAPS capsule Take 0.4 mg by mouth daily.   Marland Kitchen tiZANidine (ZANAFLEX) 4 MG tablet Take 4 mg by mouth at bedtime.    No current facility-administered medications for this visit. (Other)      REVIEW OF SYSTEMS:    ALLERGIES No Known Allergies  PAST MEDICAL HISTORY Past Medical History:  Diagnosis Date  . 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 Chest pain   . Diabetes mellitus without complication ([place])   . Dizziness   . History of tobacco abuse   . Hyperlipidemia   . Hypertension   . Stroke [facility])    Past Surgical History:  Procedure Laterality Date  . CHOLECYSTECTOMY N/A 01/30/2014   Procedure: LAPAROSCOPIC CHOLECYSTECTOMY;  Surgeon: Ralene Ok, MD;  Location: [facility] OR;  Service: General;  Laterality: N/A;    FAMILY HISTORY Family History  Problem Relation Age of Onset  . Cancer Brother   . Cancer Sister     SOCIAL HISTORY Social History   Tobacco Use  . Smoking status: Former Smoker  Packs/day: 0.25    Quit date: 11/19/2018    Years since quitting: 0.6  . Smokeless tobacco: Never Used  . Tobacco comment: smoke 3 to 4 cigarettes a day, 09/08/16 quit 3 wks ago  Substance Use Topics  . Alcohol use: No    Alcohol/week: 0.0 standard drinks    Comment: quit 25 years ago though before "drank so much that you couldn't tell b/t alcohol and wine"  . Drug use: No         OPHTHALMIC EXAM: Base Eye Exam    Visual Acuity (Snellen - Linear)      Right Left   Dist [place] 20/30-2 20/100-1   Dist ph [place]  NI       Tonometry (Tonopen, 1:36 PM)      Right Left   Pressure 17 13       Neuro/Psych    Oriented x3: Yes   Mood/Affect: Normal        Slit Lamp and Fundus Exam    External Exam      Right Left   External Normal Normal       Slit Lamp Exam       Right Left   Lids/Lashes Normal Normal   Conjunctiva/Sclera White and quiet White and quiet   Cornea Clear Clear   Anterior Chamber Deep and quiet Deep and quiet   Iris Round and reactive Round and reactive   Vitreous Normal Normal          IMAGING AND PROCEDURES  Imaging and Procedures for 07/18/19           ASSESSMENT/PLAN:  No problem-specific Assessment & Plan notes found for this encounter.    No diagnosis found.  1.The condition of the involved eye is that of a subconjunctival hemorrhage.  These are often spontaneous but are also often at or near the site of low location of an injection should to be placed into the eye.  In the absence of direct blunt or severe trauma these will typically resolve on their own spontaneously and have no impact on the vision.    These are very similar to having a bruise in your skin and it takes 2 to 3 weeks to resolve.  No specific therapy is warranted although artificial tears may always be used 3-4 times daily for comfort.  2.  3.  Ophthalmic Meds Ordered this visit:  No orders of the defined types were placed in this encounter.      No follow-ups on file.  There are no Patient Instructions on file for this visit.   Explained the diagnoses, plan, and follow up with the patient and they expressed understanding.  Patient expressed understanding of the importance of proper follow up care.   Clent Demark Rankin M.D. Diseases & Surgery of the Retina and Vitreous Retina & Diabetic [place] 07/18/19     Abbreviations: M myopia (nearsighted); A astigmatism; H hyperopia (farsighted); P presbyopia; Mrx spectacle prescription;  CTL contact lenses; OD right eye; OS left eye; OU both eyes  XT exotropia; ET esotropia; PEK punctate epithelial keratitis; PEE punctate epithelial erosions; DES dry eye syndrome; MGD meibomian gland dysfunction; ATs artificial tears; PFAT's preservative free artificial tears; [place] nuclear sclerotic cataract; PSC  posterior subcapsular cataract; ERM epi-retinal membrane; PVD posterior vitreous detachment; RD retinal detachment; DM diabetes mellitus; DR diabetic retinopathy; NPDR non-proliferative diabetic retinopathy; PDR proliferative diabetic retinopathy; CSME clinically significant macular edema; DME diabetic macular edema; dbh dot blot hemorrhages; CWS cotton wool spot;  POAG primary open angle glaucoma; C/D cup-to-disc ratio; HVF humphrey visual field; GVF goldmann visual field; OCT optical coherence tomography; IOP intraocular pressure; BRVO Branch retinal vein occlusion; CRVO central retinal vein occlusion; CRAO central retinal artery occlusion; BRAO branch retinal artery occlusion; RT retinal tear; SB scleral buckle; PPV pars plana vitrectomy; VH Vitreous hemorrhage; PRP panretinal laser photocoagulation; IVK intravitreal kenalog; VMT vitreomacular traction; MH Macular hole;  NVD neovascularization of the disc; NVE neovascularization elsewhere; AREDS age related eye disease study; ARMD age related macular degeneration; POAG primary open angle glaucoma; EBMD epithelial/anterior basement membrane dystrophy; ACIOL anterior chamber intraocular lens; IOL intraocular lens; PCIOL posterior chamber intraocular lens; Phaco/IOL phacoemulsification with intraocular lens placement; [place] photorefractive keratectomy; LASIK laser assisted in situ keratomileusis; HTN hypertension; DM diabetes mellitus; COPD chronic obstructive pulmonary disease

## 2019-08-23 ENCOUNTER — Ambulatory Visit (INDEPENDENT_AMBULATORY_CARE_PROVIDER_SITE_OTHER): Payer: Medicare Other | Admitting: Ophthalmology

## 2019-08-23 ENCOUNTER — Other Ambulatory Visit: Payer: Self-pay

## 2019-08-23 ENCOUNTER — Encounter (INDEPENDENT_AMBULATORY_CARE_PROVIDER_SITE_OTHER): Payer: Self-pay | Admitting: Ophthalmology

## 2019-08-23 DIAGNOSIS — E119 Type 2 diabetes mellitus without complications: Secondary | ICD-10-CM | POA: Diagnosis not present

## 2019-08-23 DIAGNOSIS — H353221 Exudative age-related macular degeneration, left eye, with active choroidal neovascularization: Secondary | ICD-10-CM

## 2019-08-23 MED ORDER — BEVACIZUMAB CHEMO INJECTION 1.25MG/0.05ML SYRINGE FOR KALEIDOSCOPE
1.2500 mg | INTRAVITREAL | Status: AC | PRN
  Administered 2019-08-23: 1.25 mg via INTRAVITREAL

## 2019-08-23 NOTE — Progress Notes (Signed)
08/23/2019     CHIEF COMPLAINT Patient presents for Retina Follow Up   HISTORY OF PRESENT ILLNESS: Peter Mann is a 79 y.o. male who presents to the clinic today for:   HPI    Retina Follow Up    Patient presents with  Wet AMD.  In left eye.  This started 6 weeks ago.  Severity is mild.  Duration of 6 weeks.  Since onset it is stable.          Comments    6 Week AMD F/U OS, poss Avastin OS  Pt denies noticeable changes to [place] OU since last visit. Pt denies ocular pain, flashes of light, or floaters OU.         Last edited by Rockie Neighbours, [place] on 08/23/2019  8:32 AM. (History)      Referring physician: Nolene Ebbs, MD [place],  [place] [address]  HISTORICAL INFORMATION:   Selected notes from the MEDICAL RECORD NUMBER    Lab Results  Component Value Date   HGBA1C 8.5 (H) 08/23/2015     CURRENT MEDICATIONS: Current Outpatient Medications (Ophthalmic Drugs)  Medication Sig  . cycloSPORINE (RESTASIS) 0.05 % ophthalmic emulsion Place 1 drop into the left eye daily.    No current facility-administered medications for this visit. (Ophthalmic Drugs)   Current Outpatient Medications (Other)  Medication Sig  . acetaminophen (TYLENOL) 500 MG tablet Take 1,000 mg by mouth every 6 (six) hours as needed for mild pain or headache.  Marland Kitchen amLODipine (NORVASC) 10 MG tablet Take 10 mg by mouth daily.  Marland Kitchen aspirin 325 MG tablet Take 1 tablet (325 mg total) by mouth daily.  . cyclobenzaprine (FLEXERIL) 10 MG tablet Take 1 tablet (10 mg total) by mouth 3 (three) times daily as needed for muscle spasms.  Marland Kitchen dexlansoprazole (DEXILANT) 60 MG capsule Take 60 mg by mouth daily.  . diclofenac sodium (VOLTAREN) 1 % GEL Apply 2 g topically as needed (pain).   . lubiprostone (AMITIZA) 24 MCG capsule Take 24 mcg by mouth 2 (two) times daily with a meal.  . meclizine (ANTIVERT) 25 MG tablet Take 25 mg by mouth 2 (two) times daily.  . metoprolol (LOPRESSOR) 50 MG tablet Take 75  mg by mouth 2 (two) times daily.  . ONE TOUCH ULTRA TEST test strip   . polyethylene glycol (MIRALAX / GLYCOLAX) 17 g packet Take 17 g by mouth daily.  . rosuvastatin (CRESTOR) 20 MG tablet Take 20 mg by mouth daily.  . saxagliptin HCl (ONGLYZA) 5 MG TABS tablet Take 5 mg by mouth daily.  . tamsulosin (FLOMAX) 0.4 MG CAPS capsule Take 0.4 mg by mouth daily.   Marland Kitchen tiZANidine (ZANAFLEX) 4 MG tablet Take 4 mg by mouth at bedtime.    No current facility-administered medications for this visit. (Other)      REVIEW OF SYSTEMS:    ALLERGIES No Known Allergies  PAST MEDICAL HISTORY Past Medical History:  Diagnosis Date  . 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 Chest pain   . Diabetes mellitus without complication ([place])   . Dizziness   . History of tobacco abuse   . Hyperlipidemia   . Hypertension   . Stroke [facility])    Past Surgical History:  Procedure Laterality Date  . CHOLECYSTECTOMY N/A 01/30/2014   Procedure: LAPAROSCOPIC CHOLECYSTECTOMY;  Surgeon: Ralene Ok, MD;  Location: [facility];  Service: General;  Laterality: N/A;    FAMILY HISTORY Family History  Problem  Relation Age of Onset  . Cancer Brother   . Cancer Sister     SOCIAL HISTORY Social History   Tobacco Use  . Smoking status: Former Smoker    Packs/day: 0.25    Quit date: 11/19/2018    Years since quitting: 0.7  . Smokeless tobacco: Never Used  . Tobacco comment: smoke 3 to 4 cigarettes a day, 09/08/16 quit 3 wks ago  Substance Use Topics  . Alcohol use: No    Alcohol/week: 0.0 standard drinks    Comment: quit 25 years ago though before "drank so much that you couldn't tell b/t alcohol and wine"  . Drug use: No         OPHTHALMIC EXAM:  Base Eye Exam    Visual Acuity (ETDRS)      Right Left   Dist [place] 20/30 -2 20/200   Dist ph  NI 20/60 +1       Tonometry (Tonopen, 8:35 AM)      Right Left   Pressure 15 16       Pupils      Pupils Dark Light Shape React APD   Right  PERRL 2 2 Round Minimal None   Left PERRL 2 2 Round Minimal None       Visual Fields (Counting fingers)      Left Right    Full Full       Extraocular Movement      Right Left    Full Full       Neuro/Psych    Oriented x3: Yes   Mood/Affect: Normal       Dilation    Left eye: 1.0% Mydriacyl, 2.5% Phenylephrine @ 8:35 AM        Slit Lamp and Fundus Exam    External Exam      Right Left   External Normal Normal       Slit Lamp Exam      Right Left   Lids/Lashes Normal Normal   Conjunctiva/Sclera White and quiet White and quiet   Cornea Clear Clear   Anterior Chamber Deep and quiet Deep and quiet   Iris Round and reactive Round and reactive   Lens 3+ Nuclear sclerosis 3+ Nuclear sclerosis   Anterior Vitreous Normal Normal       Fundus Exam      Right Left   Posterior Vitreous  Posterior vitreous detachment   Disc  Normal   C/D Ratio  0.5   Macula  Age related macular degeneration, Macular thickening, Hard drusen, Retinal pigment epithelial detachment   Vessels  no DR   Periphery  Normal          IMAGING AND PROCEDURES  Imaging and Procedures for 08/23/19  OCT, Retina - OU - Both Eyes       Right Eye Quality was good. Scan locations included subfoveal. Central Foveal Thickness: 260. Progression has been stable. Findings include normal observations, retinal drusen .   Left Eye Central Foveal Thickness: 297. Progression has worsened. Findings include abnormal foveal contour, subretinal fluid, pigment epithelial detachment.   Notes Increased disease activity subfoveal left eye, currently at 36-day interval of examination and treatment.  Will repeat intravitreal Avastin OS today to maintain       Intravitreal Injection, Pharmacologic Agent - OS - Left Eye       Time Out 08/23/2019. 9:18 AM. Confirmed correct patient, procedure, site, and patient consented.   Anesthesia Topical anesthesia was used. Anesthetic medications included Akten 3.5%.  Procedure Preparation included Tobramycin 0.3%, 10% betadine to eyelids. A 30 gauge needle was used.   Injection:  1.25 mg Bevacizumab (AVASTIN) SOLN   NDC: [address]-6, Lot: [id number]   Route: Intravitreal, Site: Left Eye, Waste: 0 mg  Post-op Post injection exam found visual acuity of at least counting fingers. The patient tolerated the procedure well. There were no complications. The patient received written and verbal post procedure care education. Post injection medications were not given.                 ASSESSMENT/PLAN:  Exudative age-related macular degeneration of left eye with active choroidal neovascularization (HCC) Increased serous component of the ARMD wet OS.  As compared to 36 days prior, will repeat intravitreal Avastin OS today to maintain.  And repeat examination in 5 weeks of the left eye  Diabetes mellitus type 2, noninsulin dependent ([place]) No detectable diabetic retinopathy today      ICD-10-CM   1. Exudative age-related macular degeneration of left eye with active choroidal neovascularization (HCC)  H35.3221 OCT, Retina - OU - Both Eyes    Intravitreal Injection, Pharmacologic Agent - OS - Left Eye    Bevacizumab (AVASTIN) SOLN 1.25 mg  2. Diabetes mellitus type 2, noninsulin dependent (HCC)  E11.9     1.  Repeat examination in 5 weeks, likely repeat intravitreal Avastin OS  2.  3.  Ophthalmic Meds Ordered this visit:  Meds ordered this encounter  Medications  . Bevacizumab (AVASTIN) SOLN 1.25 mg       Return in about 5 weeks (around 09/27/2019) for dilate, OS, AVASTIN OCT.  There are no Patient Instructions on file for this visit.   Explained the diagnoses, plan, and follow up with the patient and they expressed understanding.  Patient expressed understanding of the importance of proper follow up care.   Clent Demark Hriday Stai M.D. Diseases & Surgery of the Retina and Vitreous Retina & Diabetic [place] 08/23/19     Abbreviations: M  myopia (nearsighted); A astigmatism; H hyperopia (farsighted); P presbyopia; Mrx spectacle prescription;  CTL contact lenses; OD right eye; OS left eye; OU both eyes  XT exotropia; ET esotropia; PEK punctate epithelial keratitis; PEE punctate epithelial erosions; DES dry eye syndrome; MGD meibomian gland dysfunction; ATs artificial tears; PFAT's preservative free artificial tears; [place] nuclear sclerotic cataract; PSC posterior subcapsular cataract; ERM epi-retinal membrane; PVD posterior vitreous detachment; RD retinal detachment; DM diabetes mellitus; DR diabetic retinopathy; NPDR non-proliferative diabetic retinopathy; PDR proliferative diabetic retinopathy; CSME clinically significant macular edema; DME diabetic macular edema; dbh dot blot hemorrhages; CWS cotton wool spot; POAG primary open angle glaucoma; C/D cup-to-disc ratio; HVF humphrey visual field; GVF goldmann visual field; OCT optical coherence tomography; IOP intraocular pressure; BRVO Branch retinal vein occlusion; CRVO central retinal vein occlusion; CRAO central retinal artery occlusion; BRAO branch retinal artery occlusion; RT retinal tear; SB scleral buckle; PPV pars plana vitrectomy; VH Vitreous hemorrhage; PRP panretinal laser photocoagulation; IVK intravitreal kenalog; VMT vitreomacular traction; MH Macular hole;  NVD neovascularization of the disc; NVE neovascularization elsewhere; AREDS age related eye disease study; ARMD age related macular degeneration; POAG primary open angle glaucoma; EBMD epithelial/anterior basement membrane dystrophy; ACIOL anterior chamber intraocular lens; IOL intraocular lens; PCIOL posterior chamber intraocular lens; Phaco/IOL phacoemulsification with intraocular lens placement; [place] photorefractive keratectomy; LASIK laser assisted in situ keratomileusis; HTN hypertension; DM diabetes mellitus; COPD chronic obstructive pulmonary disease

## 2019-08-23 NOTE — Assessment & Plan Note (Signed)
Increased serous component of the ARMD wet OS.  As compared to 36 days prior, will repeat intravitreal Avastin OS today to maintain.  And repeat examination in 5 weeks of the left eye

## 2019-08-23 NOTE — Assessment & Plan Note (Signed)
No detectable diabetic retinopathy today 

## 2019-09-27 ENCOUNTER — Encounter (INDEPENDENT_AMBULATORY_CARE_PROVIDER_SITE_OTHER): Payer: Medicare Other | Admitting: Ophthalmology

## 2019-09-28 ENCOUNTER — Other Ambulatory Visit: Payer: Self-pay

## 2019-09-28 ENCOUNTER — Encounter (INDEPENDENT_AMBULATORY_CARE_PROVIDER_SITE_OTHER): Payer: Self-pay | Admitting: Ophthalmology

## 2019-09-28 ENCOUNTER — Ambulatory Visit (INDEPENDENT_AMBULATORY_CARE_PROVIDER_SITE_OTHER): Payer: Medicare Other | Admitting: Ophthalmology

## 2019-09-28 DIAGNOSIS — H353221 Exudative age-related macular degeneration, left eye, with active choroidal neovascularization: Secondary | ICD-10-CM

## 2019-09-28 MED ORDER — BEVACIZUMAB CHEMO INJECTION 1.25MG/0.05ML SYRINGE FOR KALEIDOSCOPE
1.2500 mg | INTRAVITREAL | Status: AC | PRN
  Administered 2019-09-28: 1.25 mg via INTRAVITREAL

## 2019-09-28 NOTE — Progress Notes (Signed)
09/28/2019     CHIEF COMPLAINT Patient presents for Retina Follow Up   HISTORY OF PRESENT ILLNESS: Peter Mann is a 79 y.o. male who presents to the clinic today for:   HPI    Retina Follow Up    Patient presents with  Wet AMD.  In left eye.  This started 5 weeks ago.  Duration of 5 weeks.  Since onset it is stable.          Comments    5 week f/u dilated exam, OCT(MAC) and possible Avastin OS today.  Pt denies noticeable changes to [place] OU since last visit. Pt denies ocular pain, flashes of light, or floaters OU.         Last edited by Melburn Popper, COA on 09/28/2019 10:25 AM. (History)      Referring physician: Nolene Ebbs, MD [place],  [place] [address]  HISTORICAL INFORMATION:   Selected notes from the MEDICAL RECORD NUMBER    Lab Results  Component Value Date   HGBA1C 8.5 (H) 08/23/2015     CURRENT MEDICATIONS: Current Outpatient Medications (Ophthalmic Drugs)  Medication Sig  . cycloSPORINE (RESTASIS) 0.05 % ophthalmic emulsion Place 1 drop into the left eye daily.    No current facility-administered medications for this visit. (Ophthalmic Drugs)   Current Outpatient Medications (Other)  Medication Sig  . acetaminophen (TYLENOL) 500 MG tablet Take 1,000 mg by mouth every 6 (six) hours as needed for mild pain or headache.  Marland Kitchen amLODipine (NORVASC) 10 MG tablet Take 10 mg by mouth daily.  Marland Kitchen aspirin 325 MG tablet Take 1 tablet (325 mg total) by mouth daily.  . cyclobenzaprine (FLEXERIL) 10 MG tablet Take 1 tablet (10 mg total) by mouth 3 (three) times daily as needed for muscle spasms.  Marland Kitchen dexlansoprazole (DEXILANT) 60 MG capsule Take 60 mg by mouth daily.  . diclofenac sodium (VOLTAREN) 1 % GEL Apply 2 g topically as needed (pain).   . lubiprostone (AMITIZA) 24 MCG capsule Take 24 mcg by mouth 2 (two) times daily with a meal.  . meclizine (ANTIVERT) 25 MG tablet Take 25 mg by mouth 2 (two) times daily.  . metoprolol (LOPRESSOR) 50 MG  tablet Take 75 mg by mouth 2 (two) times daily.  . ONE TOUCH ULTRA TEST test strip   . polyethylene glycol (MIRALAX / GLYCOLAX) 17 g packet Take 17 g by mouth daily.  . rosuvastatin (CRESTOR) 20 MG tablet Take 20 mg by mouth daily.  . saxagliptin HCl (ONGLYZA) 5 MG TABS tablet Take 5 mg by mouth daily.  . tamsulosin (FLOMAX) 0.4 MG CAPS capsule Take 0.4 mg by mouth daily.   Marland Kitchen tiZANidine (ZANAFLEX) 4 MG tablet Take 4 mg by mouth at bedtime.    No current facility-administered medications for this visit. (Other)      REVIEW OF SYSTEMS:    ALLERGIES No Known Allergies  PAST MEDICAL HISTORY Past Medical History:  Diagnosis Date  . 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 Chest pain   . Diabetes mellitus without complication ([place])   . Dizziness   . History of tobacco abuse   . Hyperlipidemia   . Hypertension   . Stroke [facility])    Past Surgical History:  Procedure Laterality Date  . CHOLECYSTECTOMY N/A 01/30/2014   Procedure: LAPAROSCOPIC CHOLECYSTECTOMY;  Surgeon: Ralene Ok, MD;  Location: [facility];  Service: General;  Laterality: N/A;    FAMILY HISTORY Family History  Problem Relation  Age of Onset  . Cancer Brother   . Cancer Sister     SOCIAL HISTORY Social History   Tobacco Use  . Smoking status: Former Smoker    Packs/day: 0.25    Quit date: 11/19/2018    Years since quitting: 0.8  . Smokeless tobacco: Never Used  . Tobacco comment: smoke 3 to 4 cigarettes a day, 09/08/16 quit 3 wks ago  Substance Use Topics  . Alcohol use: No    Alcohol/week: 0.0 standard drinks    Comment: quit 25 years ago though before "drank so much that you couldn't tell b/t alcohol and wine"  . Drug use: No         OPHTHALMIC EXAM:  Base Eye Exam    Visual Acuity (ETDRS)      Right Left   Dist [place] 20/30 -1 20/200   Dist ph [place]  20/80       Tonometry (Tonopen, 10:30 AM)      Right Left   Pressure 16 16       Pupils      Pupils Dark Light Shape React APD    Right PERRL 2 2 Round Minimal None   Left PERRL 2 2 Round Minimal None       Visual Fields (Counting fingers)      Left Right    Full Full       Extraocular Movement      Right Left    Full Full       Neuro/Psych    Oriented x3: Yes   Mood/Affect: Normal       Dilation    Left eye: 1.0% Mydriacyl, 2.5% Phenylephrine @ 10:31 AM        Slit Lamp and Fundus Exam    External Exam      Right Left   External Normal Normal       Slit Lamp Exam      Right Left   Lids/Lashes Normal Normal   Conjunctiva/Sclera White and quiet White and quiet   Cornea Clear Clear   Anterior Chamber Deep and quiet Deep and quiet   Iris Round and reactive Round and reactive   Lens 3+ Nuclear sclerosis 2+ Nuclear sclerosis   Anterior Vitreous Normal Normal       Fundus Exam      Right Left   Posterior Vitreous  Posterior vitreous detachment   Disc  Normal   C/D Ratio  0.5   Macula  Age related macular degeneration, Macular thickening, Hard drusen, Retinal pigment epithelial detachment   Vessels  no DR   Periphery  Normal          IMAGING AND PROCEDURES  Imaging and Procedures for 09/28/19  OCT, Retina - OU - Both Eyes       Right Eye Quality was good. Scan locations included subfoveal. Central Foveal Thickness: 260. Progression has been stable.   Left Eye Quality was good. Scan locations included subfoveal. Central Foveal Thickness: 380. Progression has improved. Findings include pigment epithelial detachment, subretinal fluid, choroidal neovascular membrane.   Notes Subretinal fluid, less, with fibrovascular subfoveal pigment epithelial detachment.  Overall stable on intravitreal Avastin OS       Intravitreal Injection, Pharmacologic Agent - OS - Left Eye       Time Out 09/28/2019. 11:18 AM. Confirmed correct patient, procedure, site, and patient consented.   Anesthesia Topical anesthesia was used. Anesthetic medications included Akten 3.5%.   Procedure Preparation  included 10% betadine to  eyelids, 5% betadine to ocular surface, Ofloxacin . A 30 gauge needle was used.   Injection:  1.25 mg Bevacizumab (AVASTIN) SOLN   NDC: [address]-6   Route: Intravitreal, Site: Left Eye, Waste: 0 mg  Post-op Post injection exam found visual acuity of at least counting fingers. The patient tolerated the procedure well. There were no complications. The patient received written and verbal post procedure care education. Post injection medications were not given.                 ASSESSMENT/PLAN:  Exudative age-related macular degeneration of left eye with active choroidal neovascularization (HCC) The nature of wet macular degeneration was discussed with the patient.  Forms of therapy reviewed include the use of Anti-VEGF medications injected painlessly into the eye, as well as other possible treatment modalities, including thermal laser therapy. Fellow eye involvement and risks were discussed with the patient. Upon the finding of wet age related macular degeneration, treatment will be offered. The treatment regimen is on a treat as needed basis with the intent to treat if necessary and extend interval of exams when possible. On average 1 out of 6 patients do not need lifetime therapy. However, the risk of recurrent disease is high for a lifetime.  Initially monthly, then periodic, examinations and evaluations will determine whether the next treatment is required on the day of the examination.  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 Exudative age-related macular degeneration of left eye with active choroidal neovascularization (HCC)  H35.3221 OCT, Retina - OU - Both Eyes    Intravitreal Injection, Pharmacologic Agent - OS - Left Eye    Bevacizumab (AVASTIN) SOLN 1.25 mg    1.  2.  3.  Ophthalmic  Meds Ordered this visit:  Meds ordered this encounter  Medications  . Bevacizumab (AVASTIN) SOLN 1.25 mg       Return in about 5 weeks (around 11/02/2019) for dilate, OS, AVASTIN OCT.  There are no Patient Instructions on file for this visit.   Explained the diagnoses, plan, and follow up with the patient and they expressed understanding.  Patient expressed understanding of the importance of proper follow up care.   Clent Demark Derionna Salvador M.D. Diseases & Surgery of the Retina and Vitreous Retina & Diabetic [place] 09/28/19     Abbreviations: M myopia (nearsighted); A astigmatism; H hyperopia (farsighted); P presbyopia; Mrx spectacle prescription;  CTL contact lenses; OD right eye; OS left eye; OU both eyes  XT exotropia; ET esotropia; PEK punctate epithelial keratitis; PEE punctate epithelial erosions; DES dry eye syndrome; MGD meibomian gland dysfunction; ATs artificial tears; PFAT's preservative free artificial tears; [place] nuclear sclerotic cataract; PSC posterior subcapsular cataract; ERM epi-retinal membrane; PVD posterior vitreous detachment; RD retinal detachment; DM diabetes mellitus; DR diabetic retinopathy; NPDR non-proliferative diabetic retinopathy; PDR proliferative diabetic retinopathy; CSME clinically significant macular edema; DME diabetic macular edema; dbh dot blot hemorrhages; CWS cotton wool spot; POAG primary open angle glaucoma; C/D cup-to-disc ratio; HVF humphrey visual field; GVF goldmann visual field; OCT optical coherence tomography; IOP intraocular pressure; BRVO Branch retinal vein occlusion; CRVO central retinal vein occlusion; CRAO central retinal artery occlusion; BRAO branch retinal artery occlusion; RT retinal tear; SB scleral buckle; PPV pars plana vitrectomy; VH Vitreous hemorrhage; PRP panretinal laser photocoagulation; IVK intravitreal kenalog; VMT vitreomacular traction; MH Macular hole;  NVD neovascularization of the  disc; NVE neovascularization elsewhere; AREDS  age related eye disease study; ARMD age related macular degeneration; POAG primary open angle glaucoma; EBMD epithelial/anterior basement membrane dystrophy; ACIOL anterior chamber intraocular lens; IOL intraocular lens; PCIOL posterior chamber intraocular lens; Phaco/IOL phacoemulsification with intraocular lens placement; [place] photorefractive keratectomy; LASIK laser assisted in situ keratomileusis; HTN hypertension; DM diabetes mellitus; COPD chronic obstructive pulmonary disease

## 2019-09-28 NOTE — Assessment & Plan Note (Signed)
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  Forms of therapy reviewed include the use of Anti-VEGF medications injected painlessly into the eye, as well as other possible treatment modalities, including thermal laser therapy. Fellow eye involvement and risks were discussed with the patient. Upon the finding of wet age related macular degeneration, treatment will be offered. The treatment regimen is on a treat as needed basis with the intent to treat if necessary and extend interval of exams when possible. On average 1 out of 6 patients do not need lifetime therapy. However, the risk of recurrent disease is high for a lifetime.  Initially monthly, then periodic, examinations and evaluations will determine whether the next treatment is required on the day of the examination.  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

## 2019-11-02 ENCOUNTER — Encounter (INDEPENDENT_AMBULATORY_CARE_PROVIDER_SITE_OTHER): Payer: Self-pay | Admitting: Ophthalmology

## 2019-11-02 ENCOUNTER — Ambulatory Visit (INDEPENDENT_AMBULATORY_CARE_PROVIDER_SITE_OTHER): Payer: Medicare Other | Admitting: Ophthalmology

## 2019-11-02 ENCOUNTER — Other Ambulatory Visit: Payer: Self-pay

## 2019-11-02 DIAGNOSIS — H353221 Exudative age-related macular degeneration, left eye, with active choroidal neovascularization: Secondary | ICD-10-CM | POA: Diagnosis not present

## 2019-11-02 MED ORDER — BEVACIZUMAB CHEMO INJECTION 1.25MG/0.05ML SYRINGE FOR KALEIDOSCOPE
1.2500 mg | INTRAVITREAL | Status: AC | PRN
  Administered 2019-11-02: 1.25 mg via INTRAVITREAL

## 2019-11-02 NOTE — Assessment & Plan Note (Signed)
Is much improved at 5-week interval, status post intravitreal Avastin, will repeat today

## 2019-11-02 NOTE — Progress Notes (Signed)
11/02/2019     CHIEF COMPLAINT Patient presents for Retina Follow Up   HISTORY OF PRESENT ILLNESS: Peter Mann is a 79 y.o. male who presents to the clinic today for:   HPI    Retina Follow Up    Patient presents with  Wet AMD.  In left eye.  This started 5 weeks ago.  Severity is mild.  Duration of 5 weeks.  Since onset it is stable.          Comments    5 Week AMD F/U OS, poss Avastin OS  Pt denies noticeable changes to [place] OU since last visit. Pt denies ocular pain, flashes of light, or floaters OU.  LBS: 119 yesterday         Last edited by Rockie Neighbours, [place] on 11/02/2019 10:02 AM. (History)      Referring physician: Nolene Ebbs, MD [place],  [place] [address]  HISTORICAL INFORMATION:   Selected notes from the MEDICAL RECORD NUMBER    Lab Results  Component Value Date   HGBA1C 8.5 (H) 08/23/2015     CURRENT MEDICATIONS: Current Outpatient Medications (Ophthalmic Drugs)  Medication Sig  . cycloSPORINE (RESTASIS) 0.05 % ophthalmic emulsion Place 1 drop into the left eye daily.    No current facility-administered medications for this visit. (Ophthalmic Drugs)   Current Outpatient Medications (Other)  Medication Sig  . acetaminophen (TYLENOL) 500 MG tablet Take 1,000 mg by mouth every 6 (six) hours as needed for mild pain or headache.  Marland Kitchen amLODipine (NORVASC) 10 MG tablet Take 10 mg by mouth daily.  Marland Kitchen aspirin 325 MG tablet Take 1 tablet (325 mg total) by mouth daily.  . cyclobenzaprine (FLEXERIL) 10 MG tablet Take 1 tablet (10 mg total) by mouth 3 (three) times daily as needed for muscle spasms.  Marland Kitchen dexlansoprazole (DEXILANT) 60 MG capsule Take 60 mg by mouth daily.  . diclofenac sodium (VOLTAREN) 1 % GEL Apply 2 g topically as needed (pain).   . lubiprostone (AMITIZA) 24 MCG capsule Take 24 mcg by mouth 2 (two) times daily with a meal.  . meclizine (ANTIVERT) 25 MG tablet Take 25 mg by mouth 2 (two) times daily.  . metoprolol  (LOPRESSOR) 50 MG tablet Take 75 mg by mouth 2 (two) times daily.  . ONE TOUCH ULTRA TEST test strip   . polyethylene glycol (MIRALAX / GLYCOLAX) 17 g packet Take 17 g by mouth daily.  . rosuvastatin (CRESTOR) 20 MG tablet Take 20 mg by mouth daily.  . saxagliptin HCl (ONGLYZA) 5 MG TABS tablet Take 5 mg by mouth daily.  . tamsulosin (FLOMAX) 0.4 MG CAPS capsule Take 0.4 mg by mouth daily.   Marland Kitchen tiZANidine (ZANAFLEX) 4 MG tablet Take 4 mg by mouth at bedtime.    No current facility-administered medications for this visit. (Other)      REVIEW OF SYSTEMS:    ALLERGIES No Known Allergies  PAST MEDICAL HISTORY Past Medical History:  Diagnosis Date  . 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 Chest pain   . Diabetes mellitus without complication ([place])   . Dizziness   . History of tobacco abuse   . Hyperlipidemia   . Hypertension   . Stroke [facility])    Past Surgical History:  Procedure Laterality Date  . CHOLECYSTECTOMY N/A 01/30/2014   Procedure: LAPAROSCOPIC CHOLECYSTECTOMY;  Surgeon: Ralene Ok, MD;  Location: [facility];  Service: General;  Laterality: N/A;    FAMILY HISTORY Family  History  Problem Relation Age of Onset  . Cancer Brother   . Cancer Sister     SOCIAL HISTORY Social History   Tobacco Use  . Smoking status: Former Smoker    Packs/day: 0.25    Quit date: 11/19/2018    Years since quitting: 0.9  . Smokeless tobacco: Never Used  . Tobacco comment: smoke 3 to 4 cigarettes a day, 09/08/16 quit 3 wks ago  Substance Use Topics  . Alcohol use: No    Alcohol/week: 0.0 standard drinks    Comment: quit 25 years ago though before "drank so much that you couldn't tell b/t alcohol and wine"  . Drug use: No         OPHTHALMIC EXAM:  Base Eye Exam    Visual Acuity (ETDRS)      Right Left   Dist Study [place] 20/25 -1 20/70 -2   Dist ph [place]  20/70ecc +1       Tonometry (Tonopen, 10:06 AM)      Right Left   Pressure 19 18       Pupils      Pupils  Dark Light Shape React APD   Right PERRL 2 2 Round Minimal None   Left PERRL 2 2 Round Minimal None       Visual Fields (Counting fingers)      Left Right    Full Full       Extraocular Movement      Right Left    Full Full       Neuro/Psych    Oriented x3: Yes   Mood/Affect: Normal       Dilation    Left eye: 1.0% Mydriacyl, 2.5% Phenylephrine @ 10:06 AM        Slit Lamp and Fundus Exam    External Exam      Right Left   External Normal Normal       Slit Lamp Exam      Right Left   Lids/Lashes Normal Normal   Conjunctiva/Sclera White and quiet White and quiet   Cornea Clear Clear   Anterior Chamber Deep and quiet Deep and quiet   Iris Round and reactive Round and reactive   Lens 3+ Nuclear sclerosis 2+ Nuclear sclerosis   Anterior Vitreous Normal Normal       Fundus Exam      Right Left   Posterior Vitreous  Posterior vitreous detachment   Disc  Normal   C/D Ratio  0.5   Macula  Age related macular degeneration, Macular thickening, Hard drusen, Retinal pigment epithelial detachment   Vessels  no DR   Periphery  Normal          IMAGING AND PROCEDURES  Imaging and Procedures for 11/02/19  OCT, Retina - OU - Both Eyes       Right Eye Quality was good. Central Foveal Thickness: 308. Progression has been stable.   Left Eye Quality was good. Scan locations included subfoveal. Central Foveal Thickness: 347. Progression has improved. Findings include pigment epithelial detachment.   Notes Less SRF,, associated with pigment epithelial detachment subfoveal location left eye at 5-week interval       Intravitreal Injection, Pharmacologic Agent - OS - Left Eye       Time Out 11/02/2019. 11:24 AM. Confirmed correct patient, procedure, site, and patient consented.   Anesthesia Topical anesthesia was used. Anesthetic medications included Akten 3.5%.   Procedure Preparation included Tobramycin 0.3%, 10% betadine to eyelids, 5% betadine to  ocular  surface. A supplied needle was used.   Injection:  1.25 mg Bevacizumab (AVASTIN) SOLN   NDC: [address]-6, Lot: [id number]   Route: Intravitreal, Site: Left Eye, Waste: 0 mg  Post-op Post injection exam found visual acuity of at least counting fingers. The patient tolerated the procedure well. There were no complications. The patient received written and verbal post procedure care education. Post injection medications were not given.                 ASSESSMENT/PLAN:  Exudative age-related macular degeneration of left eye with active choroidal neovascularization ([place]) Is much improved at 5-week interval, status post intravitreal Avastin, will repeat today      ICD-10-CM   1. Exudative age-related macular degeneration of left eye with active choroidal neovascularization (HCC)  H35.3221 OCT, Retina - OU - Both Eyes    Intravitreal Injection, Pharmacologic Agent - OS - Left Eye    Bevacizumab (AVASTIN) SOLN 1.25 mg    1.  2.  3.  Ophthalmic Meds Ordered this visit:  Meds ordered this encounter  Medications  . Bevacizumab (AVASTIN) SOLN 1.25 mg       Return in about 4 weeks (around 11/30/2019) for dilate, OS, AVASTIN OCT.  Patient Instructions  Patient to notify office promptly if new visual acuity declines distortions develop    Explained the diagnoses, plan, and follow up with the patient and they expressed understanding.  Patient expressed understanding of the importance of proper follow up care.   Clent Demark Keiji Melland M.D. Diseases & Surgery of the Retina and Vitreous Retina & Diabetic [place] 11/02/19     Abbreviations: M myopia (nearsighted); A astigmatism; H hyperopia (farsighted); P presbyopia; Mrx spectacle prescription;  CTL contact lenses; OD right eye; OS left eye; OU both eyes  XT exotropia; ET esotropia; PEK punctate epithelial keratitis; PEE punctate epithelial erosions; DES dry eye syndrome; MGD meibomian gland dysfunction; ATs artificial tears; PFAT's  preservative free artificial tears; [place] nuclear sclerotic cataract; PSC posterior subcapsular cataract; ERM epi-retinal membrane; PVD posterior vitreous detachment; RD retinal detachment; DM diabetes mellitus; DR diabetic retinopathy; NPDR non-proliferative diabetic retinopathy; PDR proliferative diabetic retinopathy; CSME clinically significant macular edema; DME diabetic macular edema; dbh dot blot hemorrhages; CWS cotton wool spot; POAG primary open angle glaucoma; C/D cup-to-disc ratio; HVF humphrey visual field; GVF goldmann visual field; OCT optical coherence tomography; IOP intraocular pressure; BRVO Branch retinal vein occlusion; CRVO central retinal vein occlusion; CRAO central retinal artery occlusion; BRAO branch retinal artery occlusion; RT retinal tear; SB scleral buckle; PPV pars plana vitrectomy; VH Vitreous hemorrhage; PRP panretinal laser photocoagulation; IVK intravitreal kenalog; VMT vitreomacular traction; MH Macular hole;  NVD neovascularization of the disc; NVE neovascularization elsewhere; AREDS age related eye disease study; ARMD age related macular degeneration; POAG primary open angle glaucoma; EBMD epithelial/anterior basement membrane dystrophy; ACIOL anterior chamber intraocular lens; IOL intraocular lens; PCIOL posterior chamber intraocular lens; Phaco/IOL phacoemulsification with intraocular lens placement; [place] photorefractive keratectomy; LASIK laser assisted in situ keratomileusis; HTN hypertension; DM diabetes mellitus; COPD chronic obstructive pulmonary disease

## 2019-11-02 NOTE — Patient Instructions (Signed)
Patient to notify office promptly if new visual acuity declines distortions develop

## 2019-11-30 ENCOUNTER — Encounter (INDEPENDENT_AMBULATORY_CARE_PROVIDER_SITE_OTHER): Payer: Medicare Other | Admitting: Ophthalmology

## 2019-11-30 ENCOUNTER — Ambulatory Visit (INDEPENDENT_AMBULATORY_CARE_PROVIDER_SITE_OTHER): Payer: Medicare Other | Admitting: Ophthalmology

## 2019-11-30 ENCOUNTER — Other Ambulatory Visit: Payer: Self-pay

## 2019-11-30 ENCOUNTER — Encounter (INDEPENDENT_AMBULATORY_CARE_PROVIDER_SITE_OTHER): Payer: Self-pay | Admitting: Ophthalmology

## 2019-11-30 DIAGNOSIS — H353221 Exudative age-related macular degeneration, left eye, with active choroidal neovascularization: Secondary | ICD-10-CM

## 2019-11-30 MED ORDER — BEVACIZUMAB CHEMO INJECTION 1.25MG/0.05ML SYRINGE FOR KALEIDOSCOPE
1.2500 mg | INTRAVITREAL | Status: AC | PRN
  Administered 2019-11-30: 1.25 mg via INTRAVITREAL

## 2019-11-30 NOTE — Assessment & Plan Note (Signed)
OS currently at 4-week follow-up.  With chronic pigment epithelial detachment, subretinal fluid, possibly some component of an RPE rip leading to this configuration.  No real change at 4 weeks.  We will repeat injection today and examination again in 5 weeks

## 2019-11-30 NOTE — Progress Notes (Signed)
11/30/2019     CHIEF COMPLAINT Patient presents for Retina Follow Up   HISTORY OF PRESENT ILLNESS: Peter Mann is a 79 y.o. male who presents to the clinic today for:   HPI    Retina Follow Up    Patient presents with  Wet AMD.  In left eye.  Severity is moderate.  Duration of 28.  Since onset it is stable.  I, the attending physician,  performed the HPI with the patient and updated documentation appropriately.          Comments    28 Day Wet AMD f\u OS. Possible Avastin OS. OCT  Pt states no changes in vision. Denies any complaints.       Last edited by Tilda Franco on 11/30/2019 10:26 AM. (History)      Referring physician: Nolene Ebbs, MD [place],  [place] [address]  HISTORICAL INFORMATION:   Selected notes from the MEDICAL RECORD NUMBER    Lab Results  Component Value Date   HGBA1C 8.5 (H) 08/23/2015     CURRENT MEDICATIONS: Current Outpatient Medications (Ophthalmic Drugs)  Medication Sig  . cycloSPORINE (RESTASIS) 0.05 % ophthalmic emulsion Place 1 drop into the left eye daily.    No current facility-administered medications for this visit. (Ophthalmic Drugs)   Current Outpatient Medications (Other)  Medication Sig  . acetaminophen (TYLENOL) 500 MG tablet Take 1,000 mg by mouth every 6 (six) hours as needed for mild pain or headache.  Marland Kitchen amLODipine (NORVASC) 10 MG tablet Take 10 mg by mouth daily.  Marland Kitchen aspirin 325 MG tablet Take 1 tablet (325 mg total) by mouth daily.  . cyclobenzaprine (FLEXERIL) 10 MG tablet Take 1 tablet (10 mg total) by mouth 3 (three) times daily as needed for muscle spasms.  Marland Kitchen dexlansoprazole (DEXILANT) 60 MG capsule Take 60 mg by mouth daily.  . diclofenac sodium (VOLTAREN) 1 % GEL Apply 2 g topically as needed (pain).   . lubiprostone (AMITIZA) 24 MCG capsule Take 24 mcg by mouth 2 (two) times daily with a meal.  . meclizine (ANTIVERT) 25 MG tablet Take 25 mg by mouth 2 (two) times daily.  . metoprolol  (LOPRESSOR) 50 MG tablet Take 75 mg by mouth 2 (two) times daily.  . ONE TOUCH ULTRA TEST test strip   . polyethylene glycol (MIRALAX / GLYCOLAX) 17 g packet Take 17 g by mouth daily.  . rosuvastatin (CRESTOR) 20 MG tablet Take 20 mg by mouth daily.  . saxagliptin HCl (ONGLYZA) 5 MG TABS tablet Take 5 mg by mouth daily.  . tamsulosin (FLOMAX) 0.4 MG CAPS capsule Take 0.4 mg by mouth daily.   Marland Kitchen tiZANidine (ZANAFLEX) 4 MG tablet Take 4 mg by mouth at bedtime.    No current facility-administered medications for this visit. (Other)      REVIEW OF SYSTEMS:    ALLERGIES No Known Allergies  PAST MEDICAL HISTORY Past Medical History:  Diagnosis Date  . 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 Chest pain   . Diabetes mellitus without complication ([place])   . Dizziness   . History of tobacco abuse   . Hyperlipidemia   . Hypertension   . Stroke [facility])    Past Surgical History:  Procedure Laterality Date  . CHOLECYSTECTOMY N/A 01/30/2014   Procedure: LAPAROSCOPIC CHOLECYSTECTOMY;  Surgeon: Ralene Ok, MD;  Location: [facility] OR;  Service: General;  Laterality: N/A;    FAMILY HISTORY Family History  Problem Relation Age of  Onset  . Cancer Brother   . Cancer Sister     SOCIAL HISTORY Social History   Tobacco Use  . Smoking status: Former Smoker    Packs/day: 0.25    Quit date: 11/19/2018    Years since quitting: 1.0  . Smokeless tobacco: Never Used  . Tobacco comment: smoke 3 to 4 cigarettes a day, 09/08/16 quit 3 wks ago  Substance Use Topics  . Alcohol use: No    Alcohol/week: 0.0 standard drinks    Comment: quit 25 years ago though before "drank so much that you couldn't tell b/t alcohol and wine"  . Drug use: No         OPHTHALMIC EXAM: Base Eye Exam    Visual Acuity (Snellen - Linear)      Right Left   Dist [place] 20/30 -1 20/100   Dist ph [place]  NI       Pachymetry (11/30/2019)      Right Left   Thickness 21 18       Pupils      Pupils Dark Light  Shape React APD   Right PERRL 2 2 Round Minimal None   Left PERRL 2 2 Round [place] None       Visual Fields (Counting fingers)      Left Right    Full Full       Neuro/Psych    Oriented x3: Yes   Mood/Affect: Normal       Dilation    Left eye: 1.0% Mydriacyl @ 10:30 AM        Slit Lamp and Fundus Exam    External Exam      Right Left   External Normal Normal       Slit Lamp Exam      Right Left   Lids/Lashes Normal Normal   Conjunctiva/Sclera White and quiet White and quiet   Cornea Clear Clear   Anterior Chamber Deep and quiet Deep and quiet   Iris Round and reactive Round and reactive   Lens 3+ Nuclear sclerosis 2+ Nuclear sclerosis   Anterior Vitreous Normal Normal       Fundus Exam      Right Left   Posterior Vitreous  Posterior vitreous detachment   Disc  Normal   C/D Ratio  0.5   Macula  Age related macular degeneration, Macular thickening, Hard drusen, Retinal pigment epithelial detachment   Vessels  no DR   Periphery  Normal          IMAGING AND PROCEDURES  Imaging and Procedures for 11/30/19  OCT, Retina - OU - Both Eyes       Right Eye Quality was good. Scan locations included subfoveal. Central Foveal Thickness: 255. Progression has been stable.   Left Eye Quality was good. Scan locations included subfoveal. Central Foveal Thickness: 336. Progression has improved. Findings include pigment epithelial detachment.   Notes Less SRF,, associated with pigment epithelial detachment subfoveal location left eye at 4-week interval       Intravitreal Injection, Pharmacologic Agent - OS - Left Eye       Time Out 11/30/2019. 11:01 AM. Confirmed correct patient, procedure, site, and patient consented.   Anesthesia Topical anesthesia was used. Anesthetic medications included Akten 3.5%.   Procedure Preparation included Tobramycin 0.3%, 10% betadine to eyelids, 5% betadine to ocular surface. A supplied needle was used.   Injection:  1.25  mg Bevacizumab (AVASTIN) SOLN   NDC: [address]-7, Lot: [id number]   Route: Intravitreal,  Site: Left Eye, Waste: 0 mg  Post-op Post injection exam found visual acuity of at least counting fingers. The patient tolerated the procedure well. There were no complications. The patient received written and verbal post procedure care education. Post injection medications were not given.                 ASSESSMENT/PLAN:  Exudative age-related macular degeneration of left eye with active choroidal neovascularization ([place]) OS currently at 4-week follow-up.  With chronic pigment epithelial detachment, subretinal fluid, possibly some component of an RPE rip leading to this configuration.  No real change at 4 weeks.  We will repeat injection today and examination again in 5 weeks      ICD-10-CM   1. Exudative age-related macular degeneration of left eye with active choroidal neovascularization (HCC)  H35.3221 OCT, Retina - OU - Both Eyes    Intravitreal Injection, Pharmacologic Agent - OS - Left Eye    Bevacizumab (AVASTIN) SOLN 1.25 mg    1.  Repeat intravitreal Avastin OS today to maintain current anatomy and visual acuity  2.  Follow-up examination in 5 to 6 weeks and to monitor  3.  Ophthalmic Meds Ordered this visit:  Meds ordered this encounter  Medications  . Bevacizumab (AVASTIN) SOLN 1.25 mg       Return in about 5 weeks (around 01/04/2020) for dilate, OS, AVASTIN OCT.  Patient Instructions  Patient asked to report new onset visual acuity declines or distortions in either eye    Explained the diagnoses, plan, and follow up with the patient and they expressed understanding.  Patient expressed understanding of the importance of proper follow up care.   Clent Demark Shonita Rinck M.D. Diseases & Surgery of the Retina and Vitreous Retina & Diabetic [place] 11/30/19     Abbreviations: M myopia (nearsighted); A astigmatism; H hyperopia (farsighted); P presbyopia; Mrx spectacle  prescription;  CTL contact lenses; OD right eye; OS left eye; OU both eyes  XT exotropia; ET esotropia; PEK punctate epithelial keratitis; PEE punctate epithelial erosions; DES dry eye syndrome; MGD meibomian gland dysfunction; ATs artificial tears; PFAT's preservative free artificial tears; [place] nuclear sclerotic cataract; PSC posterior subcapsular cataract; ERM epi-retinal membrane; PVD posterior vitreous detachment; RD retinal detachment; DM diabetes mellitus; DR diabetic retinopathy; NPDR non-proliferative diabetic retinopathy; PDR proliferative diabetic retinopathy; CSME clinically significant macular edema; DME diabetic macular edema; dbh dot blot hemorrhages; CWS cotton wool spot; POAG primary open angle glaucoma; C/D cup-to-disc ratio; HVF humphrey visual field; GVF goldmann visual field; OCT optical coherence tomography; IOP intraocular pressure; BRVO Branch retinal vein occlusion; CRVO central retinal vein occlusion; CRAO central retinal artery occlusion; BRAO branch retinal artery occlusion; RT retinal tear; SB scleral buckle; PPV pars plana vitrectomy; VH Vitreous hemorrhage; PRP panretinal laser photocoagulation; IVK intravitreal kenalog; VMT vitreomacular traction; MH Macular hole;  NVD neovascularization of the disc; NVE neovascularization elsewhere; AREDS age related eye disease study; ARMD age related macular degeneration; POAG primary open angle glaucoma; EBMD epithelial/anterior basement membrane dystrophy; ACIOL anterior chamber intraocular lens; IOL intraocular lens; PCIOL posterior chamber intraocular lens; Phaco/IOL phacoemulsification with intraocular lens placement; [place] photorefractive keratectomy; LASIK laser assisted in situ keratomileusis; HTN hypertension; DM diabetes mellitus; COPD chronic obstructive pulmonary disease

## 2019-11-30 NOTE — Patient Instructions (Signed)
Patient asked to report new onset visual acuity declines or distortions in either eye

## 2019-12-01 ENCOUNTER — Encounter (INDEPENDENT_AMBULATORY_CARE_PROVIDER_SITE_OTHER): Payer: Medicare Other | Admitting: Ophthalmology

## 2020-01-04 ENCOUNTER — Encounter (INDEPENDENT_AMBULATORY_CARE_PROVIDER_SITE_OTHER): Payer: Self-pay | Admitting: Ophthalmology

## 2020-01-04 ENCOUNTER — Other Ambulatory Visit: Payer: Self-pay

## 2020-01-04 ENCOUNTER — Ambulatory Visit (INDEPENDENT_AMBULATORY_CARE_PROVIDER_SITE_OTHER): Payer: Medicare Other | Admitting: Ophthalmology

## 2020-01-04 DIAGNOSIS — E119 Type 2 diabetes mellitus without complications: Secondary | ICD-10-CM | POA: Diagnosis not present

## 2020-01-04 DIAGNOSIS — H353221 Exudative age-related macular degeneration, left eye, with active choroidal neovascularization: Secondary | ICD-10-CM

## 2020-01-04 DIAGNOSIS — H43812 Vitreous degeneration, left eye: Secondary | ICD-10-CM

## 2020-01-04 MED ORDER — BEVACIZUMAB CHEMO INJECTION 1.25MG/0.05ML SYRINGE FOR KALEIDOSCOPE
1.2500 mg | INTRAVITREAL | Status: AC | PRN
  Administered 2020-01-04: 1.25 mg via INTRAVITREAL

## 2020-01-04 NOTE — Progress Notes (Signed)
01/04/2020     CHIEF COMPLAINT Patient presents for Retina Follow Up   HISTORY OF PRESENT ILLNESS: Peter Mann is a 79 y.o. male who presents to the clinic today for:   HPI    Retina Follow Up    Patient presents with  Wet AMD.  In left eye.  This started 5 weeks ago.  Severity is mild.  Duration of 5 weeks.  Since onset it is stable.          Comments    5 Week AMD F/U OS, poss Avastin OS  Pt denies noticeable changes to [place] OU since last visit. Pt denies ocular pain, flashes of light, or floaters OU.         Last edited by Rockie Neighbours, [place] on 01/04/2020  9:20 AM. (History)      Referring physician: Nolene Ebbs, MD [place],  [place] [address]  HISTORICAL INFORMATION:   Selected notes from the MEDICAL RECORD NUMBER    Lab Results  Component Value Date   HGBA1C 8.5 (H) 08/23/2015     CURRENT MEDICATIONS: Current Outpatient Medications (Ophthalmic Drugs)  Medication Sig  . cycloSPORINE (RESTASIS) 0.05 % ophthalmic emulsion Place 1 drop into the left eye daily.    No current facility-administered medications for this visit. (Ophthalmic Drugs)   Current Outpatient Medications (Other)  Medication Sig  . acetaminophen (TYLENOL) 500 MG tablet Take 1,000 mg by mouth every 6 (six) hours as needed for mild pain or headache.  Marland Kitchen amLODipine (NORVASC) 10 MG tablet Take 10 mg by mouth daily.  Marland Kitchen aspirin 325 MG tablet Take 1 tablet (325 mg total) by mouth daily.  . cyclobenzaprine (FLEXERIL) 10 MG tablet Take 1 tablet (10 mg total) by mouth 3 (three) times daily as needed for muscle spasms.  Marland Kitchen dexlansoprazole (DEXILANT) 60 MG capsule Take 60 mg by mouth daily.  . diclofenac sodium (VOLTAREN) 1 % GEL Apply 2 g topically as needed (pain).   . lubiprostone (AMITIZA) 24 MCG capsule Take 24 mcg by mouth 2 (two) times daily with a meal.  . meclizine (ANTIVERT) 25 MG tablet Take 25 mg by mouth 2 (two) times daily.  . metoprolol (LOPRESSOR) 50 MG tablet Take  75 mg by mouth 2 (two) times daily.  . ONE TOUCH ULTRA TEST test strip   . polyethylene glycol (MIRALAX / GLYCOLAX) 17 g packet Take 17 g by mouth daily.  . rosuvastatin (CRESTOR) 20 MG tablet Take 20 mg by mouth daily.  . saxagliptin HCl (ONGLYZA) 5 MG TABS tablet Take 5 mg by mouth daily.  . tamsulosin (FLOMAX) 0.4 MG CAPS capsule Take 0.4 mg by mouth daily.   Marland Kitchen tiZANidine (ZANAFLEX) 4 MG tablet Take 4 mg by mouth at bedtime.    No current facility-administered medications for this visit. (Other)      REVIEW OF SYSTEMS:    ALLERGIES No Known Allergies  PAST MEDICAL HISTORY Past Medical History:  Diagnosis Date  . 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 Chest pain   . Diabetes mellitus without complication ([place])   . Dizziness   . History of tobacco abuse   . Hyperlipidemia   . Hypertension   . Stroke [facility])    Past Surgical History:  Procedure Laterality Date  . CHOLECYSTECTOMY N/A 01/30/2014   Procedure: LAPAROSCOPIC CHOLECYSTECTOMY;  Surgeon: Ralene Ok, MD;  Location: [facility];  Service: General;  Laterality: N/A;    FAMILY HISTORY Family History  Problem  Relation Age of Onset  . Cancer Brother   . Cancer Sister     SOCIAL HISTORY Social History   Tobacco Use  . Smoking status: Former Smoker    Packs/day: 0.25    Quit date: 11/19/2018    Years since quitting: 1.1  . Smokeless tobacco: Never Used  . Tobacco comment: smoke 3 to 4 cigarettes a day, 09/08/16 quit 3 wks ago  Substance Use Topics  . Alcohol use: No    Alcohol/week: 0.0 standard drinks    Comment: quit 25 years ago though before "drank so much that you couldn't tell b/t alcohol and wine"  . Drug use: No         OPHTHALMIC EXAM: Base Eye Exam    Visual Acuity (ETDRS)      Right Left   Dist [place] 20/30 20/70 -2   Dist ph [place] 20/25 +2 20/70 +1       Tonometry (Tonopen, 9:20 AM)      Right Left   Pressure 15 15       Pachymetry (11/30/2019)      Right Left   Thickness  21 18       Pupils      Pupils Dark Light Shape React APD   Right PERRL 2 2 Round Minimal None   Left PERRL 2 2 Round Minimal None       Visual Fields (Counting fingers)      Left Right    Full Full       Extraocular Movement      Right Left    Full Full       Neuro/Psych    Oriented x3: Yes   Mood/Affect: Normal       Dilation    Left eye: 1.0% Mydriacyl, 2.5% Phenylephrine @ 9:24 AM        Slit Lamp and Fundus Exam    External Exam      Right Left   External Normal Normal       Slit Lamp Exam      Right Left   Lids/Lashes Normal Normal   Conjunctiva/Sclera White and quiet White and quiet   Cornea Clear Clear   Anterior Chamber Deep and quiet Deep and quiet   Iris Round and reactive Round and reactive   Lens 3+ Nuclear sclerosis 2+ Nuclear sclerosis   Anterior Vitreous Normal Normal       Fundus Exam      Right Left   Posterior Vitreous  Posterior vitreous detachment   Disc  Normal   C/D Ratio  0.5   Macula  Age related macular degeneration, Macular thickening, Hard drusen, Retinal pigment epithelial detachment   Vessels  no DR   Periphery  Normal          IMAGING AND PROCEDURES  Imaging and Procedures for 01/04/20  OCT, Retina - OU - Both Eyes       Right Eye Quality was good. Scan locations included subfoveal. Central Foveal Thickness: 261.   Left Eye Quality was good. Scan locations included subfoveal. Central Foveal Thickness: 340. Progression has been stable. Findings include abnormal foveal contour, subretinal fluid, pigment epithelial detachment.   Notes OSOS, overall stable at 5 to 6-week interval follow-up examination , overall stable at 5 to 6-week interval follow-up examination       Intravitreal Injection, Pharmacologic Agent - OS - Left Eye       Time Out 01/04/2020. 11:10 AM. Confirmed correct patient, procedure, site, and  patient consented.   Anesthesia Topical anesthesia was used. Anesthetic medications included  Akten 3.5%.   Procedure Preparation included Tobramycin 0.3%, 10% betadine to eyelids, 5% betadine to ocular surface. A supplied needle was used.   Injection:  1.25 mg Bevacizumab (AVASTIN) SOLN   NDC: 70360-001-02, Lot: [id number]   Route: Intravitreal, Site: Left Eye, Waste: 0 mg  Post-op Post injection exam found visual acuity of at least counting fingers. The patient tolerated the procedure well. There were no complications. The patient received written and verbal post procedure care education. Post injection medications were not given.                 ASSESSMENT/PLAN:  Exudative age-related macular degeneration of left eye with active choroidal neovascularization (HCC) Like active vascularized PED, with subretinal fluid, stable at 5-week interval on intravitreal Avastin, repeat today  Diabetes mellitus type 2, noninsulin dependent ([place]) The patient has diabetes without any evidence of retinopathy. The patient advised to maintain good blood glucose control, excellent blood pressure control, and favorable levels of cholesterol, low density lipoprotein, and high density lipoproteins. Follow up in 1 year was recommended. Explained that fluctuations in visual acuity , or "out of focus", may result from large variations of blood sugar control.  Posterior vitreous detachment of left eye No holes or tears left eye      ICD-10-CM   1. Exudative age-related macular degeneration of left eye with active choroidal neovascularization (HCC)  H35.3221 OCT, Retina - OU - Both Eyes    Intravitreal Injection, Pharmacologic Agent - OS - Left Eye    Bevacizumab (AVASTIN) SOLN 1.25 mg  2. Diabetes mellitus type 2, noninsulin dependent (HCC)  E11.9   3. Posterior vitreous detachment of left eye  H43.812     1.  2.  3.  Ophthalmic Meds Ordered this visit:  Meds ordered this encounter  Medications  . Bevacizumab (AVASTIN) SOLN 1.25 mg       Return in about 6 weeks (around 02/15/2020)  for DILATE OU, AVASTIN OCT, OS.  There are no Patient Instructions on file for this visit.   Explained the diagnoses, plan, and follow up with the patient and they expressed understanding.  Patient expressed understanding of the importance of proper follow up care.   Clent Demark Yolando Gillum M.D. Diseases & Surgery of the Retina and Vitreous Retina & Diabetic [place] 01/04/20     Abbreviations: M myopia (nearsighted); A astigmatism; H hyperopia (farsighted); P presbyopia; Mrx spectacle prescription;  CTL contact lenses; OD right eye; OS left eye; OU both eyes  XT exotropia; ET esotropia; PEK punctate epithelial keratitis; PEE punctate epithelial erosions; DES dry eye syndrome; MGD meibomian gland dysfunction; ATs artificial tears; PFAT's preservative free artificial tears; [place] nuclear sclerotic cataract; PSC posterior subcapsular cataract; ERM epi-retinal membrane; PVD posterior vitreous detachment; RD retinal detachment; DM diabetes mellitus; DR diabetic retinopathy; NPDR non-proliferative diabetic retinopathy; PDR proliferative diabetic retinopathy; CSME clinically significant macular edema; DME diabetic macular edema; dbh dot blot hemorrhages; CWS cotton wool spot; POAG primary open angle glaucoma; C/D cup-to-disc ratio; HVF humphrey visual field; GVF goldmann visual field; OCT optical coherence tomography; IOP intraocular pressure; BRVO Branch retinal vein occlusion; CRVO central retinal vein occlusion; CRAO central retinal artery occlusion; BRAO branch retinal artery occlusion; RT retinal tear; SB scleral buckle; PPV pars plana vitrectomy; VH Vitreous hemorrhage; PRP panretinal laser photocoagulation; IVK intravitreal kenalog; VMT vitreomacular traction; MH Macular hole;  NVD neovascularization of the disc; NVE neovascularization elsewhere; AREDS age related eye disease  study; ARMD age related macular degeneration; POAG primary open angle glaucoma; EBMD epithelial/anterior basement membrane dystrophy;  ACIOL anterior chamber intraocular lens; IOL intraocular lens; PCIOL posterior chamber intraocular lens; Phaco/IOL phacoemulsification with intraocular lens placement; [place] photorefractive keratectomy; LASIK laser assisted in situ keratomileusis; HTN hypertension; DM diabetes mellitus; COPD chronic obstructive pulmonary disease

## 2020-01-04 NOTE — Assessment & Plan Note (Signed)
No holes or tears left eye

## 2020-01-04 NOTE — Assessment & Plan Note (Signed)
Like active vascularized PED, with subretinal fluid, stable at 5-week interval on intravitreal Avastin, repeat today

## 2020-01-04 NOTE — Assessment & Plan Note (Signed)

## 2020-02-15 ENCOUNTER — Other Ambulatory Visit: Payer: Self-pay

## 2020-02-15 ENCOUNTER — Encounter (INDEPENDENT_AMBULATORY_CARE_PROVIDER_SITE_OTHER): Payer: Self-pay | Admitting: Ophthalmology

## 2020-02-15 ENCOUNTER — Ambulatory Visit (INDEPENDENT_AMBULATORY_CARE_PROVIDER_SITE_OTHER): Payer: Medicare Other | Admitting: Ophthalmology

## 2020-02-15 ENCOUNTER — Encounter (INDEPENDENT_AMBULATORY_CARE_PROVIDER_SITE_OTHER): Payer: Medicare Other | Admitting: Ophthalmology

## 2020-02-15 DIAGNOSIS — H353221 Exudative age-related macular degeneration, left eye, with active choroidal neovascularization: Secondary | ICD-10-CM | POA: Diagnosis not present

## 2020-02-15 DIAGNOSIS — E119 Type 2 diabetes mellitus without complications: Secondary | ICD-10-CM | POA: Diagnosis not present

## 2020-02-15 DIAGNOSIS — H353111 Nonexudative age-related macular degeneration, right eye, early dry stage: Secondary | ICD-10-CM | POA: Diagnosis not present

## 2020-02-15 DIAGNOSIS — H43812 Vitreous degeneration, left eye: Secondary | ICD-10-CM

## 2020-02-15 DIAGNOSIS — H2511 Age-related nuclear cataract, right eye: Secondary | ICD-10-CM | POA: Insufficient documentation

## 2020-02-15 DIAGNOSIS — H35722 Serous detachment of retinal pigment epithelium, left eye: Secondary | ICD-10-CM

## 2020-02-15 DIAGNOSIS — H2513 Age-related nuclear cataract, bilateral: Secondary | ICD-10-CM | POA: Insufficient documentation

## 2020-02-15 MED ORDER — BEVACIZUMAB 2.5 MG/0.1ML IZ SOSY
2.5000 mg | PREFILLED_SYRINGE | INTRAVITREAL | Status: AC | PRN
  Administered 2020-02-15: 2.5 mg via INTRAVITREAL

## 2020-02-15 NOTE — Assessment & Plan Note (Signed)

## 2020-02-15 NOTE — Progress Notes (Signed)
02/15/2020     CHIEF COMPLAINT Patient presents for Retina Follow Up   HISTORY OF PRESENT ILLNESS: Peter Mann is a 79 y.o. male who presents to the clinic today for:   HPI    Retina Follow Up    Patient presents with  Wet AMD.  In left eye.  Severity is moderate.  Duration of 6 weeks.  Since onset it is stable.  I, the attending physician,  performed the HPI with the patient and updated documentation appropriately.          Comments    6 Week f\u OU. Possible Avastin OS. OCT  Pt states he has not noticed any major changes in vision.  BGL: did not check       Last edited by Tilda Franco on 02/15/2020  8:46 AM. (History)      Referring physician: Nolene Ebbs, MD [place],  [place] [address]  HISTORICAL INFORMATION:   Selected notes from the MEDICAL RECORD NUMBER    Lab Results  Component Value Date   HGBA1C 8.5 (H) 08/23/2015     CURRENT MEDICATIONS: Current Outpatient Medications (Ophthalmic Drugs)  Medication Sig  . cycloSPORINE (RESTASIS) 0.05 % ophthalmic emulsion Place 1 drop into the left eye daily.    No current facility-administered medications for this visit. (Ophthalmic Drugs)   Current Outpatient Medications (Other)  Medication Sig  . acetaminophen (TYLENOL) 500 MG tablet Take 1,000 mg by mouth every 6 (six) hours as needed for mild pain or headache.  Marland Kitchen amLODipine (NORVASC) 10 MG tablet Take 10 mg by mouth daily.  Marland Kitchen aspirin 325 MG tablet Take 1 tablet (325 mg total) by mouth daily.  . cyclobenzaprine (FLEXERIL) 10 MG tablet Take 1 tablet (10 mg total) by mouth 3 (three) times daily as needed for muscle spasms.  Marland Kitchen dexlansoprazole (DEXILANT) 60 MG capsule Take 60 mg by mouth daily.  . diclofenac sodium (VOLTAREN) 1 % GEL Apply 2 g topically as needed (pain).   . lubiprostone (AMITIZA) 24 MCG capsule Take 24 mcg by mouth 2 (two) times daily with a meal.  . meclizine (ANTIVERT) 25 MG tablet Take 25 mg by mouth 2 (two) times  daily.  . metoprolol (LOPRESSOR) 50 MG tablet Take 75 mg by mouth 2 (two) times daily.  . ONE TOUCH ULTRA TEST test strip   . polyethylene glycol (MIRALAX / GLYCOLAX) 17 g packet Take 17 g by mouth daily.  . rosuvastatin (CRESTOR) 20 MG tablet Take 20 mg by mouth daily.  . saxagliptin HCl (ONGLYZA) 5 MG TABS tablet Take 5 mg by mouth daily.  . tamsulosin (FLOMAX) 0.4 MG CAPS capsule Take 0.4 mg by mouth daily.   Marland Kitchen tiZANidine (ZANAFLEX) 4 MG tablet Take 4 mg by mouth at bedtime.    No current facility-administered medications for this visit. (Other)      REVIEW OF SYSTEMS: ROS    Positive for: Endocrine   Last edited by Tilda Franco on 02/15/2020  8:46 AM. (History)       ALLERGIES No Known Allergies  PAST MEDICAL HISTORY Past Medical History:  Diagnosis Date  . 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 Chest pain   . Diabetes mellitus without complication ([place])   . Dizziness   . History of tobacco abuse   . Hyperlipidemia   . Hypertension   . Stroke [facility])    Past Surgical History:  Procedure Laterality Date  . CHOLECYSTECTOMY N/A 01/30/2014  Procedure: LAPAROSCOPIC CHOLECYSTECTOMY;  Surgeon: Ralene Ok, MD;  Location: [facility] OR;  Service: General;  Laterality: N/A;    FAMILY HISTORY Family History  Problem Relation Age of Onset  . Cancer Brother   . Cancer Sister     SOCIAL HISTORY Social History   Tobacco Use  . Smoking status: Former Smoker    Packs/day: 0.25    Quit date: 11/19/2018    Years since quitting: 1.2  . Smokeless tobacco: Never Used  . Tobacco comment: smoke 3 to 4 cigarettes a day, 09/08/16 quit 3 wks ago  Substance Use Topics  . Alcohol use: No    Alcohol/week: 0.0 standard drinks    Comment: quit 25 years ago though before "drank so much that you couldn't tell b/t alcohol and wine"  . Drug use: No         OPHTHALMIC EXAM:  Base Eye Exam    Visual Acuity (Snellen - Linear)      Right Left   Dist [place] 20/25 -2  20/200 +   Dist ph [place]  20/100 +       Tonometry (Tonopen, 8:50 AM)      Right Left   Pressure 13 13       Pachymetry (11/30/2019)      Right Left   Thickness 21 18       Pupils      Pupils Dark Light Shape React APD   Right PERRL 2 2 Round Minimal None   Left PERRL 2 2 Round Minimal None       Visual Fields (Counting fingers)      Left Right    Full Full       Neuro/Psych    Oriented x3: Yes   Mood/Affect: Normal       Dilation    Both eyes: 1.0% Mydriacyl, 2.5% Phenylephrine @ 8:50 AM        Slit Lamp and Fundus Exam    External Exam      Right Left   External Normal Normal       Slit Lamp Exam      Right Left   Lids/Lashes Normal Normal   Conjunctiva/Sclera White and quiet White and quiet   Cornea Clear Clear   Anterior Chamber Deep and quiet Deep and quiet   Iris Round and reactive Round and reactive   Lens 2+ Nuclear sclerosis 2+ Nuclear sclerosis   Anterior Vitreous Normal Normal       Fundus Exam      Right Left   Posterior Vitreous Normal Posterior vitreous detachment   Disc Normal Normal   C/D Ratio 0.35 0.5   Macula Hard drusen, no macular thickening, no exudates Age related macular degeneration, Macular thickening, Hard drusen, Retinal pigment epithelial detachment, no hemorrhage   Vessels no DR no DR   Periphery Normal Normal          IMAGING AND PROCEDURES  Imaging and Procedures for 02/15/20  OCT, Retina - OU - Both Eyes       Right Eye Quality was good. Scan locations included subfoveal. Central Foveal Thickness: 259. Progression has been stable. Findings include retinal drusen , abnormal foveal contour, no SRF, no IRF.   Left Eye Quality was good. Scan locations included subfoveal. Central Foveal Thickness: 339. Progression has improved. Findings include subretinal fluid, pigment epithelial detachment, choroidal neovascular membrane, intraretinal fluid.   Notes OS with chronic RPE rip type PED vascularized, overall stable at  current 6-week interval left  eye, will repeat injection today to maintain       Intravitreal Injection, Pharmacologic Agent - OS - Left Eye       Time Out 02/15/2020. 9:54 AM. Confirmed correct patient, procedure, site, and patient consented.   Anesthesia Topical anesthesia was used. Anesthetic medications included Akten 3.5%.   Procedure Preparation included Tobramycin 0.3%, 10% betadine to eyelids, 5% betadine to ocular surface. A supplied needle was used.   Injection:  2.5 mg Bevacizumab (AVASTIN) 2.5mg /0.11mL SOSY   NDC: 82707-867-54, Lot: [id number]   Route: Intravitreal, Site: Left Eye  Post-op Post injection exam found visual acuity of at least counting fingers. The patient tolerated the procedure well. There were no complications. The patient received written and verbal post procedure care education. Post injection medications were not given.                 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 Checking an [place]ler Grid daily with advice to return immediately should a distortion develop, was given to the patient. The patient 's smoking status now and in the past was determined and advice based on the AREDS study was provided regarding the consumption of antioxidant supplements. AREDS 2 vitamin formulation was recommended. Consumption of dark leafy vegetables and fresh fruits of various colors was recommended. Treatment modalities for wet macular degeneration particularly the use of intravitreal injections of anti-blood vessel growth factors was discussed with the patient. Avastin, Lucentis, and Eylea are the available options. On occasion, therapy includes the use of photodynamic therapy and thermal laser.  Stressed to the patient do not rub eyes.  Patient was advised to check Amsler Grid daily and return immediately if changes are noted. Instructions on using the grid were given to the patient. All patient questions were answered.  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  An informational brochure was given to the patient.  All the patient's questions were answered.  The patient was asked to return if new or different flashes or floaters develops.   Patient was instructed to contact office immediately if any changes were noticed. I explained to the patient that vitreous inside the eye is similar to jello inside a bowl. As the jello melts it can start to pull away from the bowl, similarly the vitreous throughout our lives can begin to pull away from the retina. That process is called a posterior vitreous detachment. In some cases, the vitreous can tug hard enough on the retina to form a retinal tear. I discussed with the patient the signs and symptoms of a retinal detachment.  Do not rub the eye.  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 Exudative age-related macular degeneration of left eye with active choroidal neovascularization ([place])  H35.3221 OCT, Retina - OU - Both  Eyes    Intravitreal Injection, Pharmacologic Agent - OS - Left Eye    bevacizumab (AVASTIN) SOSY 2.5 mg  2. Diabetes mellitus type 2, noninsulin dependent (HCC)   E11.9   3. 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 Posterior vitreous detachment of left eye  H43.812   5. Serous detachment of retinal pigment epithelium of left eye  H35.722     1.  OS overall stable, but chronically active vascularized pigment epithelial detachment, CNVM, nonetheless good stable acuity at 6-week interval will repeat injection Avastin today and examination in 6 weeks left eye  2.  3.  Ophthalmic Meds Ordered this visit:  Meds ordered this encounter  Medications  . bevacizumab (AVASTIN) SOSY 2.5 mg       Return in about 6 weeks (around 03/28/2020) for dilate, OS, AVASTIN OCT.  There are no Patient Instructions on file for this visit.   Explained the diagnoses, plan, and follow up with the patient and they expressed understanding.  Patient expressed understanding of the importance of proper follow up care.   Clent Demark Korrie Hofbauer M.D. Diseases & Surgery of the Retina and Vitreous Retina & Diabetic [place] 02/15/20     Abbreviations: M myopia (nearsighted); A astigmatism; H hyperopia (farsighted); P presbyopia; Mrx spectacle prescription;  CTL contact lenses; OD right eye; OS left eye; OU both eyes  XT exotropia; ET esotropia; PEK punctate epithelial keratitis; PEE punctate epithelial erosions; DES dry eye syndrome; MGD meibomian gland dysfunction; ATs artificial tears; PFAT's preservative free artificial tears; [place] nuclear sclerotic cataract; PSC posterior subcapsular cataract; ERM epi-retinal membrane; PVD posterior vitreous detachment; RD retinal detachment; DM diabetes mellitus; DR diabetic retinopathy; NPDR non-proliferative diabetic retinopathy; PDR proliferative diabetic retinopathy; CSME clinically significant macular edema; DME diabetic macular edema; dbh dot blot hemorrhages; CWS cotton wool spot; POAG primary open angle glaucoma; C/D cup-to-disc ratio; HVF humphrey visual field; GVF goldmann visual field; OCT  optical coherence tomography; IOP intraocular pressure; BRVO Branch retinal vein occlusion; CRVO central retinal vein occlusion; CRAO central retinal artery occlusion; BRAO branch retinal artery occlusion; RT retinal tear; SB scleral buckle; PPV pars plana vitrectomy; VH Vitreous hemorrhage; PRP panretinal laser photocoagulation; IVK intravitreal kenalog; VMT vitreomacular traction; MH Macular hole;  NVD neovascularization of the disc; NVE neovascularization elsewhere; AREDS age related eye disease study; ARMD age related macular degeneration; POAG primary open angle glaucoma; EBMD epithelial/anterior basement membrane dystrophy; ACIOL anterior chamber intraocular lens; IOL intraocular lens; PCIOL posterior chamber intraocular lens; Phaco/IOL phacoemulsification with intraocular lens placement; [place] photorefractive keratectomy; LASIK laser assisted in situ keratomileusis; HTN hypertension; DM diabetes mellitus; COPD chronic obstructive pulmonary disease

## 2020-02-15 NOTE — Assessment & Plan Note (Signed)

## 2020-02-15 NOTE — Assessment & Plan Note (Signed)

## 2020-02-15 NOTE — Assessment & Plan Note (Signed)
No detectable diabetic retinopathy 

## 2020-02-15 NOTE — Assessment & Plan Note (Signed)
Stable secondary to PED CNVM OS

## 2020-02-15 NOTE — Assessment & Plan Note (Signed)
Chronic vascularized pigment epithelial detachment, with subretinal fluid and minor intraretinal fluid stabilized on current regimen of intravitreal Avastin will repeat today and examination left eye in 6 weeks

## 2020-02-21 ENCOUNTER — Ambulatory Visit (INDEPENDENT_AMBULATORY_CARE_PROVIDER_SITE_OTHER): Payer: Medicare Other | Admitting: Cardiovascular Disease

## 2020-02-21 ENCOUNTER — Encounter: Payer: Self-pay | Admitting: Cardiovascular Disease

## 2020-02-21 ENCOUNTER — Other Ambulatory Visit: Payer: Self-pay

## 2020-02-21 DIAGNOSIS — E782 Mixed hyperlipidemia: Secondary | ICD-10-CM | POA: Diagnosis not present

## 2020-02-21 DIAGNOSIS — F17200 Nicotine dependence, unspecified, uncomplicated: Secondary | ICD-10-CM

## 2020-02-21 DIAGNOSIS — I10 Essential (primary) hypertension: Secondary | ICD-10-CM

## 2020-02-21 NOTE — Assessment & Plan Note (Signed)
History of essential hypertension a blood pressure measured today at 102/58.  He is on amlodipine and metoprolol.

## 2020-02-21 NOTE — Assessment & Plan Note (Signed)
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

## 2020-02-21 NOTE — Progress Notes (Signed)
02/21/2020 Peter Mann   June 24, 1940  [id number]  Primary Physician Nolene Ebbs, MD Primary Cardiologist: Lorretta Harp MD Lupe Carney, [place]  HPI:  Peter Mann is a 79 y.o.  mildly overweight separated African American male father of 2 children referred by Dr. Pierce Crane for cardiovascular evaluation because of chest pain. I last saw him in the office 02/17/2019. He is retired from working [employer], systems [employer]. His cardiac risk factor profile is positive for 100-pack-year history of tobacco abuse still continuing to smoke off and on.History of hypertension and dyslipidemia. He also has a history of prostate cancer. Because of chest pain several years ago he underwent Myoview stress testing which was normal at that time (03/11/13). He was recently admitted with a stroke 08/23/15. CT scan showed acute infarct of the right posterior frontal lobe. His aspirin was increased to full dose and he was scheduled for a TEE and loop recorder implantation however he left prior to having this performed.  Since I saw him a year ago he is remained stable.  He continues to smoke although infrequently.  He denies chest pain or shortness of breath.   Current Meds  Medication Sig  . acetaminophen (TYLENOL) 500 MG tablet Take 1,000 mg by mouth every 6 (six) hours as needed for mild pain or headache.  Marland Kitchen amLODipine (NORVASC) 10 MG tablet Take 10 mg by mouth daily.  Marland Kitchen aspirin 325 MG tablet Take 1 tablet (325 mg total) by mouth daily.  . chlorhexidine (PERIDEX) 0.12 % solution SMARTSIG:15 Milliliter(s) By Mouth Every Morning-Evening  . cycloSPORINE (RESTASIS) 0.05 % ophthalmic emulsion Place 1 drop into the left eye daily.   Marland Kitchen dexlansoprazole (DEXILANT) 60 MG capsule Take 60 mg by mouth daily.  . diclofenac sodium (VOLTAREN) 1 % GEL Apply 2 g topically as needed (pain).   . Lancets (ONETOUCH DELICA PLUS [id number]) MISC   . lubiprostone (AMITIZA) 24 MCG capsule Take 24 mcg by mouth 2 (two)  times daily with a meal.  . meclizine (ANTIVERT) 25 MG tablet Take 25 mg by mouth 2 (two) times daily.  . metoprolol (LOPRESSOR) 50 MG tablet Take 75 mg by mouth 2 (two) times daily.  . ONE TOUCH ULTRA TEST test strip   . polyethylene glycol (MIRALAX / GLYCOLAX) 17 g packet Take 17 g by mouth daily.  . rosuvastatin (CRESTOR) 20 MG tablet Take 20 mg by mouth daily.  . saxagliptin HCl (ONGLYZA) 5 MG TABS tablet Take 5 mg by mouth daily.  . tamsulosin (FLOMAX) 0.4 MG CAPS capsule Take 0.4 mg by mouth daily.      No Known Allergies  Social History   Socioeconomic History  . Marital status: Legally Separated    Spouse name: Not on file  . Number of children: 3  . Years of education: 7  . Highest education level: Not on file  Occupational History  . Not on file  Tobacco Use  . Smoking status: Former Smoker    Packs/day: 0.25    Quit date: 11/19/2018    Years since quitting: 1.2  . Smokeless tobacco: Never Used  . Tobacco comment: smoke 3 to 4 cigarettes a day, 09/08/16 quit 3 wks ago  Substance and Sexual Activity  . Alcohol use: No    Alcohol/week: 0.0 standard drinks    Comment: quit 25 years ago though before "drank so much that you couldn't tell b/t alcohol and wine"  . Drug use: No  . Sexual activity: Never  Other Topics Concern  . Not on file  Social History Narrative   Lives alone   Social Determinants of Health   Financial Resource Strain: Not on file  Food Insecurity: Not on file  Transportation Needs: Not on file  Physical Activity: Not on file  Stress: Not on file  Social Connections: Not on file  Intimate Partner Violence: Not on file     Review of Systems: General: negative for chills, fever, night sweats or weight changes.  Cardiovascular: negative for chest pain, dyspnea on exertion, edema, orthopnea, palpitations, paroxysmal nocturnal dyspnea or shortness of breath Dermatological: negative for rash Respiratory: negative for cough or wheezing Urologic:  negative for hematuria Abdominal: negative for nausea, vomiting, diarrhea, bright red blood per rectum, melena, or hematemesis Neurologic: negative for visual changes, syncope, or dizziness All other systems reviewed and are otherwise negative except as noted above.    Blood pressure (!) 102/58, pulse (!) 44, height 5\' 6"  (1.676 m), weight 193 lb 9.6 oz (87.8 kg), SpO2 97 %.  General appearance: alert and no distress Neck: no adenopathy, no carotid bruit, no JVD, supple, symmetrical, trachea midline and thyroid not enlarged, symmetric, no tenderness/mass/nodules Lungs: clear to auscultation bilaterally Heart: regular rate and rhythm, S1, S2 normal, no murmur, click, rub or gallop Extremities: extremities normal, atraumatic, no cyanosis or edema Pulses: 2+ and symmetric Skin: Skin color, texture, turgor normal. No rashes or lesions Neurologic: Alert and oriented X 3, normal strength and tone. Normal symmetric reflexes. Normal coordination and gait  EKG sinus bradycardia 44 with nonspecific ST and T wave changes.  I personally reviewed this EKG.  ASSESSMENT AND PLAN:   Essential hypertension History of essential hypertension a blood pressure measured today at 102/58.  He is on amlodipine and metoprolol.  Hyperlipidemia History of hyperlipidemia on statin therapy followed by his PCP  Smoker History of continued tobacco abuse of couple cigarettes a day recalcitrant to receptor modification.      Lorretta Harp MD FACP,FACC,FAHA, [facility] 02/21/2020 9:07 AM

## 2020-02-21 NOTE — Assessment & Plan Note (Signed)
History of hyperlipidemia on statin therapy followed by his PCP 

## 2020-02-21 NOTE — Patient Instructions (Signed)
Medication Instructions:  Your physician recommends that you continue on your current medications as directed. Please refer to the Current Medication list given to you today.  *If you need a refill on your cardiac medications before your next appointment, please call your pharmacy*   Follow-Up: At [facility], you and your health needs are our priority.  As part of our continuing mission to provide you with exceptional heart care, we have created designated Provider Care Teams.  These Care Teams include your primary Cardiologist (physician) and Advanced Practice Providers (APPs -  Physician Assistants and Nurse Practitioners) who all work together to provide you with the care you need, when you need it.  We recommend signing up for the patient portal called "MyChart".  Sign up information is provided on this After Visit Summary.  MyChart is used to connect with patients for Virtual Visits (Telemedicine).  Patients are able to view lab/test results, encounter notes, upcoming appointments, etc.  Non-urgent messages can be sent to your provider as well.   To learn more about what you can do with MyChart, go to [email or web address].    Your next appointment:   12 month(s)  The format for your next appointment:   In Person  Provider:   Dr. Quay Burow

## 2020-03-28 ENCOUNTER — Encounter (INDEPENDENT_AMBULATORY_CARE_PROVIDER_SITE_OTHER): Payer: Medicare Other | Admitting: Ophthalmology

## 2020-04-18 ENCOUNTER — Ambulatory Visit (INDEPENDENT_AMBULATORY_CARE_PROVIDER_SITE_OTHER): Payer: Medicare Other | Admitting: Ophthalmology

## 2020-04-18 ENCOUNTER — Other Ambulatory Visit: Payer: Self-pay

## 2020-04-18 ENCOUNTER — Encounter (INDEPENDENT_AMBULATORY_CARE_PROVIDER_SITE_OTHER): Payer: Self-pay | Admitting: Ophthalmology

## 2020-04-18 ENCOUNTER — Encounter (INDEPENDENT_AMBULATORY_CARE_PROVIDER_SITE_OTHER): Payer: Medicare Other | Admitting: Ophthalmology

## 2020-04-18 DIAGNOSIS — H353111 Nonexudative age-related macular degeneration, right eye, early dry stage: Secondary | ICD-10-CM | POA: Diagnosis not present

## 2020-04-18 DIAGNOSIS — H353221 Exudative age-related macular degeneration, left eye, with active choroidal neovascularization: Secondary | ICD-10-CM | POA: Diagnosis not present

## 2020-04-18 DIAGNOSIS — H2513 Age-related nuclear cataract, bilateral: Secondary | ICD-10-CM | POA: Diagnosis not present

## 2020-04-18 DIAGNOSIS — H35722 Serous detachment of retinal pigment epithelium, left eye: Secondary | ICD-10-CM | POA: Diagnosis not present

## 2020-04-18 MED ORDER — BEVACIZUMAB 2.5 MG/0.1ML IZ SOSY
2.5000 mg | PREFILLED_SYRINGE | INTRAVITREAL | Status: AC | PRN
  Administered 2020-04-18: 2.5 mg via INTRAVITREAL

## 2020-04-18 NOTE — Progress Notes (Signed)
04/18/2020     CHIEF COMPLAINT Patient presents for Retina Follow Up (9 Week F/U OS, poss Avastin OS//Pt denies noticeable changes to [place] OU since last visit. Pt denies ocular pain, flashes of light, or floaters OU. //)   HISTORY OF PRESENT ILLNESS: Peter Mann is a 80 y.o. male who presents to the clinic today for:   HPI    Retina Follow Up    Patient presents with  Wet AMD.  In left eye.  This started 9 weeks ago.  Severity is mild.  Duration of 9 weeks.  Since onset it is stable. Additional comments: 9 Week F/U OS, poss Avastin OS  Pt denies noticeable changes to [place] OU since last visit. Pt denies ocular pain, flashes of light, or floaters OU.          Last edited by Rockie Neighbours, [place] on 04/18/2020  8:44 AM. (History)      Referring physician: Nolene Ebbs, MD [place],  [place] [address]  HISTORICAL INFORMATION:   Selected notes from the MEDICAL RECORD NUMBER    Lab Results  Component Value Date   HGBA1C 8.5 (H) 08/23/2015     CURRENT MEDICATIONS: Current Outpatient Medications (Ophthalmic Drugs)  Medication Sig  . cycloSPORINE (RESTASIS) 0.05 % ophthalmic emulsion Place 1 drop into the left eye daily.    No current facility-administered medications for this visit. (Ophthalmic Drugs)   Current Outpatient Medications (Other)  Medication Sig  . acetaminophen (TYLENOL) 500 MG tablet Take 1,000 mg by mouth every 6 (six) hours as needed for mild pain or headache.  Marland Kitchen amLODipine (NORVASC) 10 MG tablet Take 10 mg by mouth daily.  Marland Kitchen aspirin 325 MG tablet Take 1 tablet (325 mg total) by mouth daily.  . chlorhexidine (PERIDEX) 0.12 % solution SMARTSIG:15 Milliliter(s) By Mouth Every Morning-Evening  . dexlansoprazole (DEXILANT) 60 MG capsule Take 60 mg by mouth daily.  . diclofenac sodium (VOLTAREN) 1 % GEL Apply 2 g topically as needed (pain).   . Lancets (ONETOUCH DELICA PLUS [id number]) MISC   . lubiprostone (AMITIZA) 24 MCG capsule Take 24 mcg by  mouth 2 (two) times daily with a meal.  . meclizine (ANTIVERT) 25 MG tablet Take 25 mg by mouth 2 (two) times daily.  . metoprolol (LOPRESSOR) 50 MG tablet Take 75 mg by mouth 2 (two) times daily.  . ONE TOUCH ULTRA TEST test strip   . polyethylene glycol (MIRALAX / GLYCOLAX) 17 g packet Take 17 g by mouth daily.  . rosuvastatin (CRESTOR) 20 MG tablet Take 20 mg by mouth daily.  . saxagliptin HCl (ONGLYZA) 5 MG TABS tablet Take 5 mg by mouth daily.  . tamsulosin (FLOMAX) 0.4 MG CAPS capsule Take 0.4 mg by mouth daily.    No current facility-administered medications for this visit. (Other)      REVIEW OF SYSTEMS:    ALLERGIES No Known Allergies  PAST MEDICAL HISTORY Past Medical History:  Diagnosis Date  . Cancer ([place])    prostrate ca, radiation treatments  . Chest pain   . Diabetes mellitus without complication ([place])   . Dizziness   . History of tobacco abuse   . Hyperlipidemia   . Hypertension   . Stroke ([place])   . Subconjunctival hemorrhage of left eye 07/18/2019   The condition of the involved eye is that of a subconjunctival hemorrhage.  These are often spontaneous but are also often at or near the site of low location of an injection should to  be placed into the eye.  In the absence of direct blunt or severe trauma these will typically resolve on their own spontaneously and have no impact on the vision.    These are very similar to having a bruise in your   Past Surgical History:  Procedure Laterality Date  . CHOLECYSTECTOMY N/A 01/30/2014   Procedure: LAPAROSCOPIC CHOLECYSTECTOMY;  Surgeon: Ralene Ok, MD;  Location: [facility] OR;  Service: General;  Laterality: N/A;    FAMILY HISTORY Family History  Problem Relation Age of Onset  . Cancer Brother   . Cancer Sister     SOCIAL HISTORY Social History   Tobacco Use  . Smoking status: Former Smoker    Packs/day: 0.25    Quit date: 11/19/2018    Years since quitting: 1.4  . Smokeless tobacco: Never Used  .  Tobacco comment: smoke 3 to 4 cigarettes a day, 09/08/16 quit 3 wks ago  Substance Use Topics  . Alcohol use: No    Alcohol/week: 0.0 standard drinks    Comment: quit 25 years ago though before "drank so much that you couldn't tell b/t alcohol and wine"  . Drug use: No         OPHTHALMIC EXAM: Base Eye Exam    Visual Acuity (ETDRS)      Right Left   Dist [place] 20/30 20/80 -2   Dist ph [place] 20/30 +2 20/70       Tonometry (Tonopen, 8:45 AM)      Right Left   Pressure 22 20       Pachymetry (11/30/2019)      Right Left   Thickness 21 18       Pupils      Pupils Dark Light Shape React APD   Right PERRL 2 2 Round Minimal None   Left PERRL 2 2 Round Minimal None       Visual Fields (Counting fingers)      Left Right    Full Full       Extraocular Movement      Right Left    Full Full       Neuro/Psych    Oriented x3: Yes   Mood/Affect: Normal       Dilation    Left eye: 1.0% Mydriacyl, 2.5% Phenylephrine @ 8:48 AM        Slit Lamp and Fundus Exam    External Exam      Right Left   External Normal Normal       Slit Lamp Exam      Right Left   Lids/Lashes Normal Normal   Conjunctiva/Sclera White and quiet White and quiet   Cornea Clear Clear   Anterior Chamber Deep and quiet Deep and quiet   Iris Round and reactive Round and reactive   Lens 2+ Nuclear sclerosis 2+ Nuclear sclerosis   Anterior Vitreous Normal Normal       Fundus Exam      Right Left   Posterior Vitreous  Posterior vitreous detachment   Disc  Normal   C/D Ratio  0.5   Macula  Age related macular degeneration, Macular thickening, Hard drusen, Retinal pigment epithelial detachment, no hemorrhage   Vessels  no DR   Periphery  Normal          IMAGING AND PROCEDURES  Imaging and Procedures for 04/18/20  OCT, Retina - OU - Both Eyes       Right Eye Quality was good. Scan locations included subfoveal. Central  Foveal Thickness: 259. Progression has been stable. Findings include  retinal drusen , abnormal foveal contour, no SRF, no IRF.   Left Eye Quality was good. Scan locations included subfoveal. Central Foveal Thickness: 315. Progression has improved. Findings include subretinal fluid, pigment epithelial detachment, choroidal neovascular membrane, intraretinal fluid.   Notes OS with chronic RPE rip type PED vascularized, overall stable at current 9-week interval left eye, will repeat injection today to maintain  Smaller subfoveal fluid collection today at 9-week interval will repeat injection today and examination again in 9 weeks         Intravitreal Injection, Pharmacologic Agent - OS - Left Eye       Time Out 04/18/2020. 9:46 AM. Confirmed correct patient, procedure, site, and patient consented.   Anesthesia Topical anesthesia was used. Anesthetic medications included [place] 3.5%.   Procedure Preparation included Tobramycin 0.3%, 10% betadine to eyelids, 5% betadine to ocular surface. A 30 gauge needle was used.   Injection:  2.5 mg Bevacizumab (AVASTIN) 2.5mg /0.4mL SOSY   NDC: 35329-924-26, Lot: [id number]   Route: Intravitreal, Site: Left Eye  Post-op Post injection exam found visual acuity of at least counting fingers. The patient tolerated the procedure well. There were no complications. The patient received written and verbal post procedure care education. Post injection medications were not given.                 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 Exudative age-related macular degeneration of left eye with active choroidal  neovascularization (HCC)  H35.3221 OCT, Retina - OU - Both Eyes    Intravitreal Injection, Pharmacologic Agent - OS - Left Eye    bevacizumab (AVASTIN) SOSY 2.5 mg  2. Serous detachment of retinal pigment epithelium of left eye  H35.722   3. 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 Cataract, nuclear sclerotic, both eyes  H25.13     1.  Overall smaller subfoveal serous component to the RPE detachment which is vascularized as component of CNVM OS at 9-week interval.  We will repeat injection today intravitreal Avastin and  2.  Dilate OU next likely an intravitreal Avastin OS next  3.  Ophthalmic Meds Ordered this visit:  Meds ordered this encounter  Medications  . bevacizumab (AVASTIN) SOSY 2.5 mg       Return in about 9 weeks (around 06/20/2020) for DILATE OU, AVASTIN OCT, OS.  There are no Patient Instructions on file for this visit.   Explained the diagnoses, plan, and follow up with the patient and they expressed understanding.  Patient expressed understanding of the importance of proper follow up care.   Clent Demark Josha Weekley M.D. Diseases & Surgery of the Retina and Vitreous Retina & Diabetic [place] 04/18/20     Abbreviations: M myopia (nearsighted); A astigmatism; H hyperopia (farsighted); P presbyopia; Mrx spectacle prescription;  CTL contact lenses; OD right eye; OS left eye; OU both eyes  XT exotropia; ET esotropia; PEK punctate epithelial keratitis; PEE punctate epithelial erosions; DES dry eye syndrome; MGD meibomian gland dysfunction; ATs artificial tears; PFAT's  preservative free artificial tears; [place] nuclear sclerotic cataract; PSC posterior subcapsular cataract; ERM epi-retinal membrane; PVD posterior vitreous detachment; RD retinal detachment; DM diabetes mellitus; DR diabetic retinopathy; NPDR non-proliferative diabetic retinopathy; PDR proliferative diabetic retinopathy; CSME clinically significant macular edema; DME  diabetic macular edema; dbh dot blot hemorrhages; CWS cotton wool spot; POAG primary open angle glaucoma; C/D cup-to-disc ratio; HVF humphrey visual field; GVF goldmann visual field; OCT optical coherence tomography; IOP intraocular pressure; BRVO Branch retinal vein occlusion; CRVO central retinal vein occlusion; CRAO central retinal artery occlusion; BRAO branch retinal artery occlusion; RT retinal tear; SB scleral buckle; PPV pars plana vitrectomy; VH Vitreous hemorrhage; PRP panretinal laser photocoagulation; IVK intravitreal kenalog; VMT vitreomacular traction; MH Macular hole;  NVD neovascularization of the disc; NVE neovascularization elsewhere; AREDS age related eye disease study; ARMD age related macular degeneration; POAG primary open angle glaucoma; EBMD epithelial/anterior basement membrane dystrophy; ACIOL anterior chamber intraocular lens; IOL intraocular lens; PCIOL posterior chamber intraocular lens; Phaco/IOL phacoemulsification with intraocular lens placement; [place] photorefractive keratectomy; LASIK laser assisted in situ keratomileusis; HTN hypertension; DM diabetes mellitus; COPD chronic obstructive pulmonary disease

## 2020-04-18 NOTE — Assessment & Plan Note (Signed)
No signs of CNVM formation by OCT OD

## 2020-04-18 NOTE — Assessment & Plan Note (Signed)
As component of wet ARMD, OS has improved with much smaller subfoveal fluid serous detachment at 9-week interval.  We will repeat injection today and examination again in 9 weeks

## 2020-04-18 NOTE — Assessment & Plan Note (Signed)
Progressing OU

## 2020-06-20 ENCOUNTER — Other Ambulatory Visit: Payer: Self-pay

## 2020-06-20 ENCOUNTER — Encounter (INDEPENDENT_AMBULATORY_CARE_PROVIDER_SITE_OTHER): Payer: Self-pay | Admitting: Ophthalmology

## 2020-06-20 ENCOUNTER — Ambulatory Visit (INDEPENDENT_AMBULATORY_CARE_PROVIDER_SITE_OTHER): Payer: Medicare Other | Admitting: Ophthalmology

## 2020-06-20 DIAGNOSIS — H353221 Exudative age-related macular degeneration, left eye, with active choroidal neovascularization: Secondary | ICD-10-CM | POA: Diagnosis not present

## 2020-06-20 DIAGNOSIS — H35722 Serous detachment of retinal pigment epithelium, left eye: Secondary | ICD-10-CM | POA: Diagnosis not present

## 2020-06-20 MED ORDER — BEVACIZUMAB 2.5 MG/0.1ML IZ SOSY
2.5000 mg | PREFILLED_SYRINGE | INTRAVITREAL | Status: AC | PRN
  Administered 2020-06-20: 2.5 mg via INTRAVITREAL

## 2020-06-20 NOTE — Assessment & Plan Note (Signed)

## 2020-06-20 NOTE — Assessment & Plan Note (Signed)
Component of wet ARMD, will continue to monitor as is component of CNVM is still active

## 2020-06-20 NOTE — Progress Notes (Signed)
06/20/2020     CHIEF COMPLAINT Patient presents for Retina Follow Up (9 Wk F/U OU, poss Avastin OS//Pt denies noticeable changes to [place] OU since last visit. Pt denies ocular pain, flashes of light, or floaters OU. //LBS: pt sts he checked today but forgot)   HISTORY OF PRESENT ILLNESS: Peter Mann is a 80 y.o. male who presents to the clinic today for:   HPI    Retina Follow Up    Patient presents with  Wet AMD.  In left eye.  This started 9 weeks ago.  Severity is mild.  Duration of 9 weeks.  Since onset it is stable. Additional comments: 9 Wk F/U OU, poss Avastin OS  Pt denies noticeable changes to [place] OU since last visit. Pt denies ocular pain, flashes of light, or floaters OU.   LBS: pt sts he checked today but forgot       Last edited by Rockie Neighbours, [place] on 06/20/2020 10:34 AM. (History)      Referring physician: Nolene Ebbs, MD [place],  [place] [address]  HISTORICAL INFORMATION:   Selected notes from the MEDICAL RECORD NUMBER    Lab Results  Component Value Date   HGBA1C 8.5 (H) 08/23/2015     CURRENT MEDICATIONS: Current Outpatient Medications (Ophthalmic Drugs)  Medication Sig  . cycloSPORINE (RESTASIS) 0.05 % ophthalmic emulsion Place 1 drop into the left eye daily.    No current facility-administered medications for this visit. (Ophthalmic Drugs)   Current Outpatient Medications (Other)  Medication Sig  . acetaminophen (TYLENOL) 500 MG tablet Take 1,000 mg by mouth every 6 (six) hours as needed for mild pain or headache.  Marland Kitchen amLODipine (NORVASC) 10 MG tablet Take 10 mg by mouth daily.  Marland Kitchen aspirin 325 MG tablet Take 1 tablet (325 mg total) by mouth daily.  . chlorhexidine (PERIDEX) 0.12 % solution [id number] Milliliter(s) By Mouth Every Morning-Evening  . dexlansoprazole (DEXILANT) 60 MG capsule Take 60 mg by mouth daily.  . diclofenac sodium (VOLTAREN) 1 % GEL Apply 2 g topically as needed (pain).   . Lancets (ONETOUCH DELICA PLUS  [id number]) MISC   . lubiprostone (AMITIZA) 24 MCG capsule Take 24 mcg by mouth 2 (two) times daily with a meal.  . meclizine (ANTIVERT) 25 MG tablet Take 25 mg by mouth 2 (two) times daily.  . metoprolol (LOPRESSOR) 50 MG tablet Take 75 mg by mouth 2 (two) times daily.  . ONE TOUCH ULTRA TEST test strip   . polyethylene glycol (MIRALAX / GLYCOLAX) 17 g packet Take 17 g by mouth daily.  . rosuvastatin (CRESTOR) 20 MG tablet Take 20 mg by mouth daily.  . saxagliptin HCl (ONGLYZA) 5 MG TABS tablet Take 5 mg by mouth daily.  . tamsulosin (FLOMAX) 0.4 MG CAPS capsule Take 0.4 mg by mouth daily.    No current facility-administered medications for this visit. (Other)      REVIEW OF SYSTEMS:    ALLERGIES No Known Allergies  PAST MEDICAL HISTORY Past Medical History:  Diagnosis Date  . 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 Chest pain   . Diabetes mellitus without complication ([place])   . Dizziness   . History of tobacco abuse   . Hyperlipidemia   . Hypertension   . Stroke ([place])   . Subconjunctival hemorrhage of left eye 07/18/2019   The condition of the involved eye is that of a subconjunctival hemorrhage.  These are often spontaneous but are also often  at or near the site of low location of an injection should to be placed into the eye.  In the absence of direct blunt or severe trauma these will typically resolve on their own spontaneously and have no impact on the vision.    These are very similar to having a bruise in your   Past Surgical History:  Procedure Laterality Date  . CHOLECYSTECTOMY N/A 01/30/2014   Procedure: LAPAROSCOPIC CHOLECYSTECTOMY;  Surgeon: Ralene Ok, MD;  Location: [facility] OR;  Service: General;  Laterality: N/A;    FAMILY HISTORY Family History  Problem Relation Age of Onset  . Cancer Brother   . Cancer Sister     SOCIAL HISTORY Social History   Tobacco Use  . Smoking status: Former Smoker    Packs/day: 0.25    Quit date: 11/19/2018     Years since quitting: 1.5  . Smokeless tobacco: Never Used  . Tobacco comment: smoke 3 to 4 cigarettes a day, 09/08/16 quit 3 wks ago  Substance Use Topics  . Alcohol use: No    Alcohol/week: 0.0 standard drinks    Comment: quit 25 years ago though before "drank so much that you couldn't tell b/t alcohol and wine"  . Drug use: No         OPHTHALMIC EXAM: Base Eye Exam    Visual Acuity (ETDRS)      Right Left   Dist [place] 20/30 -2 20/200   Dist ph [place] 20/25 -1 20/80 -2       Tonometry (Tonopen, 10:34 AM)      Right Left   Pressure 21 17       Pachymetry (11/30/2019)      Right Left   Thickness 21 18       Pupils      Pupils Dark Light Shape React APD   Right PERRL 2 2 Round Minimal None   Left PERRL 2 2 Round Minimal None       Visual Fields (Counting fingers)      Left Right    Full Full       Extraocular Movement      Right Left    Full Full       Neuro/Psych    Oriented x3: Yes   Mood/Affect: Normal       Dilation    Both eyes: 1.0% Mydriacyl, 2.5% Phenylephrine @ 10:38 AM        Slit Lamp and Fundus Exam    External Exam      Right Left   External Normal Normal       Slit Lamp Exam      Right Left   Lids/Lashes Normal Normal   Conjunctiva/Sclera White and quiet White and quiet   Cornea Clear Clear   Anterior Chamber Deep and quiet Deep and quiet   Iris Round and reactive Round and reactive   Lens 2+ Nuclear sclerosis 2+ Nuclear sclerosis   Anterior Vitreous Normal Normal       Fundus Exam      Right Left   Posterior Vitreous Normal Posterior vitreous detachment   Disc Normal Normal   C/D Ratio 0.35 0.5   Macula Hard drusen, no macular thickening, no exudates Age related macular degeneration, Macular thickening, Hard drusen, Retinal pigment epithelial detachment, no hemorrhage   Vessels no DR no DR   Periphery Normal Normal          IMAGING AND PROCEDURES  Imaging and Procedures for 06/20/20  OCT, Retina -  OU - Both Eyes        Right Eye Quality was good. Scan locations included subfoveal. Central Foveal Thickness: 258. Progression has been stable. Findings include retinal drusen , abnormal foveal contour, no SRF, no IRF.   Left Eye Quality was good. Scan locations included subfoveal. Central Foveal Thickness: 348. Progression has improved. Findings include subretinal fluid, pigment epithelial detachment, choroidal neovascular membrane, intraretinal fluid.   Notes OS with chronic RPE rip type PED vascularized, overall stable at current 9-week interval left eye, will repeat injection today to maintain  Stable subfoveal fluid collection today at 9-week interval will repeat injection today and examination again in 9 weeks         Intravitreal Injection, Pharmacologic Agent - OS - Left Eye       Time Out 06/20/2020. 11:39 AM. Confirmed correct patient, procedure, site, and patient consented.   Anesthesia Topical anesthesia was used. Anesthetic medications included [place] 3.5%.   Procedure Preparation included Tobramycin 0.3%, 10% betadine to eyelids, 5% betadine to ocular surface. A 30 gauge needle was used.   Injection:  2.5 mg Bevacizumab (AVASTIN) 2.5mg /0.37mL SOSY   NDC: 46270-350-09, Lot: [id number]   Route: Intravitreal, Site: Left Eye  Post-op Post injection exam found visual acuity of at least counting fingers. The patient tolerated the procedure well. There were no complications. The patient received written and verbal post procedure care education. Post injection medications were not given.                 ASSESSMENT/PLAN:  Exudative age-related macular degeneration of left eye with active choroidal neovascularization (HCC) The nature of wet macular degeneration was discussed with the patient.  Forms of therapy reviewed include the use of Anti-VEGF medications injected painlessly into the eye, as well as other possible treatment modalities, including thermal laser therapy. Fellow eye  involvement and risks were discussed with the patient. Upon the finding of wet age related macular degeneration, treatment will be offered. The treatment regimen is on a treat as needed basis with the intent to treat if necessary and extend interval of exams when possible. On average 1 out of 6 patients do not need lifetime therapy. However, the risk of recurrent disease is high for a lifetime.  Initially monthly, then periodic, examinations and evaluations will determine whether the next treatment is required on the day of the examination.  Serous detachment of retinal pigment epithelium of left eye Component of wet ARMD, will continue to monitor as is component of CNVM is still active      ICD-10-CM   1. Exudative age-related macular degeneration of left eye with active choroidal neovascularization (HCC)  H35.3221 OCT, Retina - OU - Both Eyes    Intravitreal Injection, Pharmacologic Agent - OS - Left Eye    bevacizumab (AVASTIN) SOSY 2.5 mg  2. Serous detachment of retinal pigment epithelium of left eye  H35.722     1.  Serous RPE detachment of the left eye in conjunction with exudative CNVM, at 9-week follow-up overall stable yet with impact on acuity.  We will repeat injection Avastin OS today and examination again in 9 weeks  2.  3.  Ophthalmic Meds Ordered this visit:  Meds ordered this encounter  Medications  . bevacizumab (AVASTIN) SOSY 2.5 mg       Return in about 9 weeks (around 08/22/2020) for dilate, OS, AVASTIN OCT.  There are no Patient Instructions on file for this visit.   Explained the diagnoses, plan, and follow up  with the patient and they expressed understanding.  Patient expressed understanding of the importance of proper follow up care.   Clent Demark Fara Worthy M.D. Diseases & Surgery of the Retina and Vitreous [facility] [place] 06/20/20     Abbreviations: M myopia (nearsighted); A astigmatism; H hyperopia (farsighted); P presbyopia; Mrx spectacle  prescription;  CTL contact lenses; OD right eye; OS left eye; OU both eyes  XT exotropia; ET esotropia; PEK punctate epithelial keratitis; PEE punctate epithelial erosions; DES dry eye syndrome; MGD meibomian gland dysfunction; ATs artificial tears; PFAT's preservative free artificial tears; [place] nuclear sclerotic cataract; PSC posterior subcapsular cataract; ERM epi-retinal membrane; PVD posterior vitreous detachment; RD retinal detachment; DM diabetes mellitus; DR diabetic retinopathy; NPDR non-proliferative diabetic retinopathy; PDR proliferative diabetic retinopathy; CSME clinically significant macular edema; DME diabetic macular edema; dbh dot blot hemorrhages; CWS cotton wool spot; POAG primary open angle glaucoma; C/D cup-to-disc ratio; HVF humphrey visual field; GVF goldmann visual field; OCT optical coherence tomography; IOP intraocular pressure; BRVO Branch retinal vein occlusion; CRVO central retinal vein occlusion; CRAO central retinal artery occlusion; BRAO branch retinal artery occlusion; RT retinal tear; SB scleral buckle; PPV pars plana vitrectomy; VH Vitreous hemorrhage; PRP panretinal laser photocoagulation; IVK intravitreal kenalog; VMT vitreomacular traction; MH Macular hole;  NVD neovascularization of the disc; NVE neovascularization elsewhere; AREDS age related eye disease study; ARMD age related macular degeneration; POAG primary open angle glaucoma; EBMD epithelial/anterior basement membrane dystrophy; ACIOL anterior chamber intraocular lens; IOL intraocular lens; PCIOL posterior chamber intraocular lens; Phaco/IOL phacoemulsification with intraocular lens placement; [place] photorefractive keratectomy; LASIK laser assisted in situ keratomileusis; HTN hypertension; DM diabetes mellitus; COPD chronic obstructive pulmonary disease

## 2020-07-13 ENCOUNTER — Encounter (HOSPITAL_COMMUNITY): Payer: Self-pay

## 2020-07-13 ENCOUNTER — Ambulatory Visit (HOSPITAL_COMMUNITY)
Admission: EM | Admit: 2020-07-13 | Discharge: 2020-07-13 | Disposition: A | Discharge status: Home / Self Care | Payer: Medicare Other | Attending: Emergency Medicine | Admitting: Emergency Medicine

## 2020-07-13 DIAGNOSIS — E119 Type 2 diabetes mellitus without complications: Secondary | ICD-10-CM | POA: Diagnosis not present

## 2020-07-13 DIAGNOSIS — Z0189 Encounter for other specified special examinations: Secondary | ICD-10-CM | POA: Diagnosis not present

## 2020-07-13 LAB — POCT URINALYSIS DIPSTICK, ED / UC
Bilirubin Urine: NEGATIVE
Glucose, UA: 500 mg/dL — AB
Hgb urine dipstick: NEGATIVE
Ketones, ur: NEGATIVE mg/dL
Leukocytes,Ua: NEGATIVE
Nitrite: NEGATIVE
Protein, ur: NEGATIVE mg/dL
Specific Gravity, Urine: 1.025 (ref 1.005–1.030)
Urobilinogen, UA: 0.2 mg/dL (ref 0.0–1.0)
pH: 5.5 (ref 5.0–8.0)

## 2020-07-13 LAB — CBG MONITORING, ED: Glucose-Capillary: 181 mg/dL — ABNORMAL HIGH (ref 70–99)

## 2020-07-13 NOTE — ED Triage Notes (Signed)
Pt presents to have blood sugar check; wasn't able to answer whether it has been high or low at home.

## 2020-07-13 NOTE — ED Provider Notes (Signed)
[facility]    CSN: [id number] Arrival date & time: 07/13/20  1020      History   Chief Complaint Chief Complaint  Patient presents with  . Glucose Check    HPI Peter Mann is a 80 y.o. male.   Peter Mann presents with concerns about his blood sugar. He states he felt that his meter was not working correctly, he had some higher values in the 300's. He has been taking his prescribed medications and has not been feeling unwell. No nausea or vomiting. No fevers. He takes saxagliptin for diabetes. He does see his PCP 6/9.     ROS per HPI, negative if not otherwise mentioned.      Past Medical History:  Diagnosis Date  . 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 Chest pain   . Diabetes mellitus without complication ([place])   . Dizziness   . History of tobacco abuse   . Hyperlipidemia   . Hypertension   . Stroke ([place])   . Subconjunctival hemorrhage of left eye 07/18/2019   The condition of the involved eye is that of a subconjunctival hemorrhage.  These are often spontaneous but are also often at or near the site of low location of an injection should to be placed into the eye.  In the absence of direct blunt or severe trauma these will typically resolve on their own spontaneously and have no impact on the vision.    These are very similar to having a bruise in your    Patient Active Problem List   Diagnosis Date Noted  . Cataract, nuclear sclerotic, both eyes 02/15/2020  . Exudative age-related macular degeneration of left eye with active choroidal neovascularization ([place]) 07/11/2019  . Serous detachment of retinal pigment epithelium of left eye 07/11/2019  . Posterior vitreous detachment of right eye 07/11/2019  . 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 Posterior vitreous detachment of left eye 07/11/2019  . Smoker 03/17/2016  . Facial palsy   . Acute CVA (cerebrovascular accident) ([place])   . HLD  (hyperlipidemia)   . Diabetes mellitus type 2, noninsulin dependent ([place])   . CVA (cerebral vascular accident) ([place]) 08/23/2015  . Abdominal distention   . Gangrenous cholecystitis   . Ileus ([place])   . Abdominal pain 01/28/2014  . Cholelithiasis 01/28/2014  . Cholecystitis 01/28/2014  . Symptomatic cholelithiasis 01/28/2014  . Hyperglycemia 01/28/2014  . Chest pain 02/01/2013  . Essential hypertension 02/01/2013  . Hyperlipidemia 02/01/2013    Past Surgical History:  Procedure Laterality Date  . CHOLECYSTECTOMY N/A 01/30/2014   Procedure: LAPAROSCOPIC CHOLECYSTECTOMY;  Surgeon: Ralene Ok, MD;  Location: [facility];  Service: General;  Laterality: N/A;       Home Medications    Prior to Admission medications   Medication Sig Start Date End Date Taking? Authorizing Provider  acetaminophen (TYLENOL) 500 MG tablet Take 1,000 mg by mouth every 6 (six) hours as needed for mild pain or headache.    [provider]  amLODipine (NORVASC) 10 MG tablet Take 10 mg by mouth daily.    [provider]  aspirin 325 MG tablet Take 1 tablet (325 mg total) by mouth daily. 08/27/15   Florencia Reasons, MD  chlorhexidine (PERIDEX) 0.12 % solution [id number]:[id number] Milliliter(s) By Mouth Every Morning-Evening 09/22/19   [provider]  cycloSPORINE (RESTASIS) 0.05 % ophthalmic emulsion Place 1 drop into the left eye daily.     [provider]  dexlansoprazole (DEXILANT) 60 MG capsule Take 60 mg by mouth daily.    [provider]  diclofenac sodium (VOLTAREN) 1 % GEL Apply 2 g topically as needed (pain).     [provider]  Lancets (ONETOUCH DELICA PLUS [id number]) [place]  12/04/19   [provider]  lubiprostone (AMITIZA) 24 MCG capsule Take 24 mcg by mouth 2 (two) times daily with a meal.    [provider]  meclizine (ANTIVERT) 25 MG tablet Take 25 mg by mouth 2 (two) times daily.    [provider]  metoprolol (LOPRESSOR) 50 MG tablet  Take 75 mg by mouth 2 (two) times daily.    [provider]  ONE TOUCH ULTRA TEST test strip  03/31/17   [provider]  polyethylene glycol (MIRALAX / GLYCOLAX) 17 g packet Take 17 g by mouth daily.    [provider]  rosuvastatin (CRESTOR) 20 MG tablet Take 20 mg by mouth daily.    [provider]  saxagliptin HCl (ONGLYZA) 5 MG TABS tablet Take 5 mg by mouth daily.    [provider]  tamsulosin (FLOMAX) 0.4 MG CAPS capsule Take 0.4 mg by mouth daily.     [provider]    Family History Family History  Problem Relation Age of Onset  . Cancer Brother   . Cancer Sister     Social History Social History   Tobacco Use  . Smoking status: Former Smoker    Packs/day: 0.25    Quit date: 11/19/2018    Years since quitting: 1.6  . Smokeless tobacco: Never Used  . Tobacco comment: smoke 3 to 4 cigarettes a day, 09/08/16 quit 3 wks ago  Substance Use Topics  . Alcohol use: No    Alcohol/week: 0.0 standard drinks    Comment: quit 25 years ago though before "drank so much that you couldn't tell b/t alcohol and wine"  . Drug use: No     Allergies   Patient has no known allergies.   Review of Systems Review of Systems   Physical Exam Triage Vital Signs ED Triage Vitals  Enc Vitals Group     BP 07/13/20 1056 136/70     Pulse Rate 07/13/20 1056 (!) 57     Resp 07/13/20 1056 16     Temp 07/13/20 1056 98.1 F (36.7 C)     Temp Source 07/13/20 1056 Oral     SpO2 07/13/20 1056 98 %     Weight --      Height --      Head Circumference --      Peak Flow --      Pain Score 07/13/20 1054 0     Pain Loc --      Pain Edu? --      Excl. in [place]? --    No data found.  Updated Vital Signs BP 136/70 (BP Location: Right Arm)   Pulse (!) 57   Temp 98.1 F (36.7 C) (Oral)   Resp 16   SpO2 98%   Visual Acuity Right Eye Distance:   Left Eye Distance:   Bilateral Distance:    Right Eye Near:   Left Eye Near:    Bilateral  Near:     Physical Exam Constitutional:      Appearance: He is well-developed.  Cardiovascular:     Rate and Rhythm: Normal rate.  Pulmonary:     Effort: Pulmonary effort is normal.  Skin:    General:  Skin is warm and dry.  Neurological:     Mental Status: He is alert and oriented to person, place, and time.      UC Treatments / Results  Labs (all labs ordered are listed, but only abnormal results are displayed) Labs Reviewed  CBG MONITORING, ED - Abnormal; Notable for the following components:      Result Value   Glucose-Capillary 181 (*)    All other components within normal limits  POCT URINALYSIS DIPSTICK, ED / UC - Abnormal; Notable for the following components:   Glucose, UA 500 (*)    All other components within normal limits    EKG   Radiology No results found.  Procedures Procedures (including critical care time)  Medications Ordered in UC Medications - No data to display  Initial Impression / Assessment and Plan / UC Course  I have reviewed the triage vital signs and the nursing notes.  Pertinent labs & imaging results that were available during my care of the patient were reviewed by me and considered in my medical decision making (see chart for details).     Non toxic. Benign physical exam.  Blood sugar 181 this morning, did eat breakfast prior to being seen. No acute complaints but presents with request for a blood sugar check. Endorses taking his medications as prescribed.  He is concerned his monitor is not working correctly- hyperglycemia at home vs device malfunction? He does have an appointment with his PCP in 1 month.  Final Clinical Impressions(s) / UC Diagnoses   Final diagnoses:  Encounter for laboratory test  Type 2 diabetes mellitus without complication, without long-term current use of insulin ([place])     Discharge Instructions     Your blood sugar and urine look well today given that you are not fasting.  Continue to monitor your  diet and blood sugars at home, and take your prescribed medication.  Return for any further concerns.  Follow up with your primary care provider as scheduled for further management of your diabetes.     ED Prescriptions    None     PDMP not reviewed this encounter.   Zigmund Gottron, NP 07/13/20 1205

## 2020-07-13 NOTE — Discharge Instructions (Signed)
Your blood sugar and urine look well today given that you are not fasting.  Continue to monitor your diet and blood sugars at home, and take your prescribed medication.  Return for any further concerns.  Follow up with your primary care provider as scheduled for further management of your diabetes.

## 2020-08-22 ENCOUNTER — Other Ambulatory Visit: Payer: Self-pay

## 2020-08-22 ENCOUNTER — Ambulatory Visit (INDEPENDENT_AMBULATORY_CARE_PROVIDER_SITE_OTHER): Payer: Medicare Other | Admitting: Ophthalmology

## 2020-08-22 ENCOUNTER — Encounter (INDEPENDENT_AMBULATORY_CARE_PROVIDER_SITE_OTHER): Payer: Self-pay | Admitting: Ophthalmology

## 2020-08-22 DIAGNOSIS — H353111 Nonexudative age-related macular degeneration, right eye, early dry stage: Secondary | ICD-10-CM

## 2020-08-22 DIAGNOSIS — H2513 Age-related nuclear cataract, bilateral: Secondary | ICD-10-CM | POA: Diagnosis not present

## 2020-08-22 DIAGNOSIS — H353221 Exudative age-related macular degeneration, left eye, with active choroidal neovascularization: Secondary | ICD-10-CM

## 2020-08-22 DIAGNOSIS — E119 Type 2 diabetes mellitus without complications: Secondary | ICD-10-CM | POA: Diagnosis not present

## 2020-08-22 MED ORDER — BEVACIZUMAB 2.5 MG/0.1ML IZ SOSY
2.5000 mg | PREFILLED_SYRINGE | INTRAVITREAL | Status: AC | PRN
  Administered 2020-08-22: 2.5 mg via INTRAVITREAL

## 2020-08-22 NOTE — Assessment & Plan Note (Signed)
Dilate OD as well next

## 2020-08-22 NOTE — Assessment & Plan Note (Signed)
Subfoveal subretinal hyper reflective material signifying likely exudative increase at longer 9-week interval today from wet AMD.  We will need to repeat injection Avastin today and shorten interval follow-up next to 6 weeks

## 2020-08-22 NOTE — Assessment & Plan Note (Signed)
Moderate nuclear sclerotic cataract in each eye some impact on acuity definitely present

## 2020-08-22 NOTE — Progress Notes (Signed)
08/22/2020     CHIEF COMPLAINT Patient presents for Retina Follow Up (9 Wk F/U OS, poss Avastin OS//Pt denies noticeable changes to [place] OU since last visit. Pt denies ocular pain, flashes of light, or floaters OU. Pt c/o watering OU. Pt is using restasis QD-BID OU.)   HISTORY OF PRESENT ILLNESS: Peter Mann is a 80 y.o. male who presents to the clinic today for:   HPI     Retina Follow Up           Diagnosis: Wet AMD   Laterality: left eye   Onset: 9 weeks ago   Severity: mild   Duration: 9 weeks   Course: stable   Comments: 9 Wk F/U OS, poss Avastin OS  Pt denies noticeable changes to [place] OU since last visit. Pt denies ocular pain, flashes of light, or floaters OU. Pt c/o watering OU. Pt is using restasis QD-BID OU.       Last edited by Milly Jakob, [place] on 08/22/2020 10:36 AM.      Referring physician: Nolene Ebbs, MD [place],  [place] [address]  HISTORICAL INFORMATION:   Selected notes from the MEDICAL RECORD NUMBER    Lab Results  Component Value Date   HGBA1C 8.5 (H) 08/23/2015     CURRENT MEDICATIONS: Current Outpatient Medications (Ophthalmic Drugs)  Medication Sig   cycloSPORINE (RESTASIS) 0.05 % ophthalmic emulsion Place 1 drop into the left eye daily.    No current facility-administered medications for this visit. (Ophthalmic Drugs)   Current Outpatient Medications (Other)  Medication Sig   acetaminophen (TYLENOL) 500 MG tablet Take 1,000 mg by mouth every 6 (six) hours as needed for mild pain or headache.   amLODipine (NORVASC) 10 MG tablet Take 10 mg by mouth daily.   aspirin 325 MG tablet Take 1 tablet (325 mg total) by mouth daily.   chlorhexidine (PERIDEX) 0.12 % solution SMARTSIG:15 Milliliter(s) By Mouth Every Morning-Evening   dexlansoprazole (DEXILANT) 60 MG capsule Take 60 mg by mouth daily.   diclofenac sodium (VOLTAREN) 1 % GEL Apply 2 g topically as needed (pain).    Lancets (ONETOUCH DELICA PLUS [id number]) MISC     lubiprostone (AMITIZA) 24 MCG capsule Take 24 mcg by mouth 2 (two) times daily with a meal.   meclizine (ANTIVERT) 25 MG tablet Take 25 mg by mouth 2 (two) times daily.   metoprolol (LOPRESSOR) 50 MG tablet Take 75 mg by mouth 2 (two) times daily.   ONE TOUCH ULTRA TEST test strip    polyethylene glycol (MIRALAX / GLYCOLAX) 17 g packet Take 17 g by mouth daily.   rosuvastatin (CRESTOR) 20 MG tablet Take 20 mg by mouth daily.   saxagliptin HCl (ONGLYZA) 5 MG TABS tablet Take 5 mg by mouth daily.   tamsulosin (FLOMAX) 0.4 MG CAPS capsule Take 0.4 mg by mouth daily.    No current facility-administered medications for this visit. 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  These are  often spontaneous but are also often at or near the site of low location of an injection should to be placed into the eye.  In the absence of direct blunt or severe trauma these will typically resolve on their own spontaneously and have no impact on the vision.    These are very similar to having a bruise in your   Past Surgical History:  Procedure Laterality Date   CHOLECYSTECTOMY N/A 01/30/2014   Procedure: LAPAROSCOPIC CHOLECYSTECTOMY;  Surgeon: Ralene Ok, MD;  Location: [place];  Service: General;  Laterality: N/A;    FAMILY HISTORY Family History  Problem Relation Age of Onset   Cancer Brother    Cancer Sister     SOCIAL HISTORY Social History   Tobacco Use   Smoking status: Former    Packs/day: 0.25    Pack years: 0.00    Types: Cigarettes    Quit date: 11/19/2018     Years since quitting: 1.7   Smokeless tobacco: Never   Tobacco comments:    smoke 3 to 4 cigarettes a day, 09/08/16 quit 3 wks ago  Substance Use Topics   Alcohol use: No    Alcohol/week: 0.0 standard drinks    Comment: quit 25 years ago though before "drank so much that you couldn't tell b/t alcohol and wine"   Drug use: No         OPHTHALMIC EXAM:  Base Eye Exam     Visual Acuity (ETDRS)       Right Left   Dist [place] 20/40 +2 20/200   Dist ph [place] 20/20 -1 NI         Tonometry (Tonopen, 10:40 AM)       Right Left   Pressure 18 18         Pachymetry (11/30/2019)       Right Left   Thickness 21 18         Pupils       Pupils Dark Light Shape React APD   Right PERRL 3 3 Round Minimal None   Left PERRL 3 3 Round Minimal None         Visual Fields (Counting fingers)       Left Right    Full Full         Extraocular Movement       Right Left    Full Full         Neuro/Psych     Oriented x3: Yes   Mood/Affect: Normal         Dilation     Left eye: 1.0% Mydriacyl, 2.5% Phenylephrine @ 10:40 AM           Slit Lamp and Fundus Exam     External Exam       Right Left   External Normal Normal         Slit Lamp Exam       Right Left   Lids/Lashes Normal Normal   Conjunctiva/Sclera White and quiet White and quiet   Cornea Clear Clear   Anterior Chamber Deep and quiet Deep and quiet   Iris Round and reactive Round and reactive   Lens 2+ Nuclear sclerosis 2+ Nuclear sclerosis   Anterior Vitreous Normal Normal         Fundus Exam       Right Left   Posterior Vitreous  Posterior vitreous detachment   Disc  Normal   C/D Ratio  0.5   Macula  Age  related macular degeneration, Macular thickening, Hard drusen, Retinal pigment epithelial detachment, no hemorrhage   Vessels  no DR   Periphery  Normal            IMAGING AND PROCEDURES  Imaging and Procedures for 08/22/20  OCT, Retina - OU - Both Eyes       Right  Eye Quality was good. Scan locations included subfoveal. Central Foveal Thickness: 256. Progression has been stable. Findings include retinal drusen , abnormal foveal contour, no SRF, no IRF.   Left Eye Quality was good. Scan locations included subfoveal. Central Foveal Thickness: 377. Progression has improved. Findings include subretinal fluid, pigment epithelial detachment, choroidal neovascular membrane, intraretinal fluid.   Notes OS with chronic RPE rip type PED vascularized, increased subretinal fluid at current 9-week interval left eye, will repeat injection today to improve and shorten interval to 6-week evaluation         Intravitreal Injection, Pharmacologic Agent - OS - Left Eye       Time Out 08/22/2020. 11:14 AM. Confirmed correct patient, procedure, site, and patient consented.   Anesthesia Topical anesthesia was used. Anesthetic medications included [place] 3.5%.   Procedure Preparation included Tobramycin 0.3%, 10% betadine to eyelids, 5% betadine to ocular surface. A 30 gauge needle was used.   Injection: 2.5 mg bevacizumab 2.5 MG/0.1ML   Route: Intravitreal, Site: Left Eye   NDC: [phone], Lot: [id number]   Post-op Post injection exam found visual acuity of at least counting fingers. The patient tolerated the procedure well. There were no complications. The patient received written and verbal post procedure care education. Post injection medications were not given.              ASSESSMENT/PLAN:  Exudative age-related macular degeneration of left eye with active choroidal neovascularization (HCC) Subfoveal subretinal hyper reflective material signifying likely exudative increase at longer 9-week interval today from wet AMD.  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 Exudative age-related macular degeneration of left eye with active choroidal neovascularization (HCC)  H35.3221 OCT, Retina - OU - Both Eyes    Intravitreal Injection, Pharmacologic Agent - OS - Left Eye    bevacizumab (AVASTIN) SOSY 2.5 mg    2. Diabetes mellitus type 2, noninsulin dependent (HCC)  E11.9     3. Cataract, nuclear sclerotic, both eyes  H25.13     4. 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  At extended interval of examination left eye of 9 weeks, and increase in exudative CNVM activity OS subfoveal.  We will need to repeat injection today and shorten interval examination to 6 weeks and maintain  2.  3.  Ophthalmic Meds Ordered this visit:  Meds ordered this encounter  Medications   bevacizumab (AVASTIN) SOSY 2.5 mg       Return in about 6 weeks (around 10/03/2020) for DILATE OU, AVASTIN OCT, OS.  There are no Patient Instructions on file for this visit.   Explained the diagnoses, plan, and follow up with the patient and they expressed understanding.  Patient expressed understanding of the  importance of proper follow up care.   Clent Demark Alek Borges M.D. Diseases & Surgery of the Retina and Vitreous [facility] [place] 08/22/20     Abbreviations: M myopia (nearsighted); A astigmatism; H hyperopia (farsighted); P presbyopia; Mrx spectacle prescription;  CTL contact lenses; OD right eye; OS left eye; OU both eyes  XT exotropia; ET esotropia; PEK punctate epithelial keratitis; PEE punctate epithelial erosions; DES dry eye syndrome; MGD meibomian gland dysfunction; ATs artificial tears; PFAT's preservative free artificial tears; [place] nuclear sclerotic cataract; PSC posterior subcapsular cataract; ERM  epi-retinal membrane; PVD posterior vitreous detachment; RD retinal detachment; DM diabetes mellitus; DR diabetic retinopathy; NPDR non-proliferative diabetic retinopathy; PDR proliferative diabetic retinopathy; CSME clinically significant macular edema; DME diabetic macular edema; dbh dot blot hemorrhages; CWS cotton wool spot; POAG primary open angle glaucoma; C/D cup-to-disc ratio; HVF humphrey visual field; GVF goldmann visual field; OCT optical coherence tomography; IOP intraocular pressure; BRVO Branch retinal vein occlusion; CRVO central retinal vein occlusion; CRAO central retinal artery occlusion; BRAO branch retinal artery occlusion; RT retinal tear; SB scleral buckle; PPV pars plana vitrectomy; VH Vitreous hemorrhage; PRP panretinal laser photocoagulation; IVK intravitreal kenalog; VMT vitreomacular traction; MH Macular hole;  NVD neovascularization of the disc; NVE neovascularization elsewhere; AREDS age related eye disease study; ARMD age related macular degeneration; POAG primary open angle glaucoma; EBMD epithelial/anterior basement membrane dystrophy; ACIOL anterior chamber intraocular lens; IOL intraocular lens; PCIOL posterior chamber intraocular lens; Phaco/IOL phacoemulsification with intraocular lens placement; [place] photorefractive keratectomy; LASIK laser assisted in situ keratomileusis; HTN hypertension; DM diabetes mellitus; COPD chronic obstructive pulmonary disease

## 2020-08-22 NOTE — Assessment & Plan Note (Signed)
No detectable diabetic retinopathy left eye 

## 2020-08-31 ENCOUNTER — Encounter (HOSPITAL_COMMUNITY): Payer: Self-pay | Admitting: Emergency Medicine

## 2020-08-31 ENCOUNTER — Ambulatory Visit (HOSPITAL_COMMUNITY)
Admission: EM | Admit: 2020-08-31 | Discharge: 2020-08-31 | Disposition: A | Discharge status: Home / Self Care | Payer: Medicare Other | Attending: Urgent Care | Admitting: Urgent Care

## 2020-08-31 DIAGNOSIS — Z7984 Long term (current) use of oral hypoglycemic drugs: Secondary | ICD-10-CM | POA: Diagnosis not present

## 2020-08-31 DIAGNOSIS — R350 Frequency of micturition: Secondary | ICD-10-CM

## 2020-08-31 DIAGNOSIS — E119 Type 2 diabetes mellitus without complications: Secondary | ICD-10-CM

## 2020-08-31 DIAGNOSIS — E1165 Type 2 diabetes mellitus with hyperglycemia: Secondary | ICD-10-CM | POA: Diagnosis not present

## 2020-08-31 DIAGNOSIS — R739 Hyperglycemia, unspecified: Secondary | ICD-10-CM

## 2020-08-31 LAB — CBG MONITORING, ED: Glucose-Capillary: 274 mg/dL — ABNORMAL HIGH (ref 70–99)

## 2020-08-31 LAB — POCT URINALYSIS DIPSTICK, ED / UC
Glucose, UA: 250 mg/dL — AB
Hgb urine dipstick: NEGATIVE
Leukocytes,Ua: NEGATIVE
Nitrite: NEGATIVE
Protein, ur: 30 mg/dL — AB
Specific Gravity, Urine: 1.025 (ref 1.005–1.030)
Urobilinogen, UA: 0.2 mg/dL (ref 0.0–1.0)
pH: 5.5 (ref 5.0–8.0)

## 2020-08-31 NOTE — ED Triage Notes (Signed)
Pt is present today with elevated blood sugar readings. Pt states that the only sx that he has been experiencing is frequent urination.

## 2020-08-31 NOTE — Discharge Instructions (Addendum)

## 2020-08-31 NOTE — ED Provider Notes (Signed)
[facility]   MRN: [id number] DOB: 01-21-1941  Subjective:   Peter Mann is a 80 y.o. male with type 2 diabetes presenting for a blood sugar check. Reports that he has urinary frequency. Denies fever, headache, confusion, dizziness, weakness, chest pain, shob, n/v, abdominal pain, dysuria. Has an appt with his PCP in 2 weeks. No history of CKD.  Has a history of CVA. Is compliant with his medications but admits non-compliance with his diet.   No current facility-administered medications for this encounter.  Current Outpatient Medications:    acetaminophen (TYLENOL) 500 MG tablet, Take 1,000 mg by mouth every 6 (six) hours as needed for mild pain or headache., Disp: , Rfl:    amLODipine (NORVASC) 10 MG tablet, Take 10 mg by mouth daily., Disp: , Rfl:    aspirin 325 MG tablet, Take 1 tablet (325 mg total) by mouth daily., Disp: 30 tablet, Rfl: 0   chlorhexidine (PERIDEX) 0.12 % solution, SMARTSIG:15 Milliliter(s) By Mouth Every Morning-Evening, Disp: , Rfl:    cycloSPORINE (RESTASIS) 0.05 % ophthalmic emulsion, Place 1 drop into the left eye daily. , Disp: , Rfl:    dexlansoprazole (DEXILANT) 60 MG capsule, Take 60 mg by mouth daily., Disp: , Rfl:    diclofenac sodium (VOLTAREN) 1 % GEL, Apply 2 g topically as needed (pain). , Disp: , Rfl:    Lancets (ONETOUCH DELICA PLUS [id number]) MISC, , Disp: , Rfl:    lubiprostone (AMITIZA) 24 MCG capsule, Take 24 mcg by mouth 2 (two) times daily with a meal., Disp: , Rfl:    meclizine (ANTIVERT) 25 MG tablet, Take 25 mg by mouth 2 (two) times daily., Disp: , Rfl:    metoprolol (LOPRESSOR) 50 MG tablet, Take 75 mg by mouth 2 (two) times daily., Disp: , Rfl:    ONE TOUCH ULTRA TEST test strip, , Disp: , Rfl:    polyethylene glycol (MIRALAX / GLYCOLAX) 17 g packet, Take 17 g by mouth daily., Disp: , Rfl:    rosuvastatin (CRESTOR) 20 MG tablet, Take 20 mg by mouth daily., Disp: , Rfl:    saxagliptin HCl (ONGLYZA) 5 MG TABS tablet, Take  5 mg by mouth daily., Disp: , Rfl:    tamsulosin (FLOMAX) 0.4 MG CAPS capsule, Take 0.4 mg by mouth daily. 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  These are often spontaneous but are also often at or near the site of low location of an injection should to be placed into the eye.  In the absence of direct blunt or severe trauma these will typically resolve on their own spontaneously and have no impact on the vision.    These are very similar to having a bruise in your     Past Surgical History:  Procedure Laterality Date   CHOLECYSTECTOMY N/A 01/30/2014   Procedure: LAPAROSCOPIC CHOLECYSTECTOMY;  Surgeon: Ralene Ok, MD;  Location: [facility] OR;  Service: General;  Laterality: N/A;    Family History  Problem Relation Age of Onset   Cancer Brother  Cancer Sister     Social History   Tobacco Use   Smoking status: Former    Packs/day: 0.25    Pack years: 0.00    Types: Cigarettes    Quit date: 11/19/2018    Years since quitting: 1.7   Smokeless tobacco: Never   Tobacco comments:    smoke 3 to 4 cigarettes a day, 09/08/16 quit 3 wks ago  Substance Use Topics   Alcohol use: No    Alcohol/week: 0.0 standard drinks    Comment: quit 25 years ago though before "drank so much that you couldn't tell b/t alcohol and wine"   Drug use: No    ROS   Objective:   Vitals: BP 140/60 (BP Location: Right Arm)   Pulse (!) 55   Temp 97.9 F (36.6 C) (Oral)   Resp 17   SpO2 100%   Physical Exam Constitutional:      General: He is not in acute distress.    Appearance: Normal appearance. He is well-developed. He is  not ill-appearing, toxic-appearing or diaphoretic.  HENT:     Head: Normocephalic and atraumatic.     Right Ear: External ear normal.     Left Ear: External ear normal.     Nose: Nose normal.     Mouth/Throat:     Mouth: Mucous membranes are moist.     Pharynx: Oropharynx is clear.  Eyes:     General: No scleral icterus.    Extraocular Movements: Extraocular movements intact.     Pupils: Pupils are equal, round, and reactive to light.  Cardiovascular:     Rate and Rhythm: Normal rate and regular rhythm.     Heart sounds: Normal heart sounds. No murmur heard.   No friction rub. No gallop.  Pulmonary:     Effort: Pulmonary effort is normal. No respiratory distress.     Breath sounds: Normal breath sounds. No stridor. No wheezing, rhonchi or rales.  Abdominal:     General: Bowel sounds are normal. There is no distension.     Palpations: Abdomen is soft. There is no mass.     Tenderness: There is no abdominal tenderness. There is no guarding or rebound.  Skin:    General: Skin is warm and dry.  Neurological:     Mental Status: He is alert and oriented to person, place, and time.  Psychiatric:        Mood and Affect: Mood normal.        Behavior: Behavior normal.        Thought Content: Thought content normal.    Results for orders placed or performed during the hospital encounter of 08/31/20 (from the past 24 hour(s))  POC CBG monitoring     Status: Abnormal   Collection Time: 08/31/20 11:59 AM  Result Value Ref Range   Glucose-Capillary 274 (H) 70 - 99 mg/dL  POC Urinalysis dipstick     Status: Abnormal   Collection Time: 08/31/20 12:08 PM  Result Value Ref Range   Glucose, UA 250 (A) NEGATIVE mg/dL   Bilirubin Urine SMALL (A) NEGATIVE   Ketones, ur TRACE (A) NEGATIVE mg/dL   Specific Gravity, Urine 1.025 1.005 - 1.030   Hgb urine dipstick NEGATIVE NEGATIVE   pH 5.5 5.0 - 8.0   Protein, ur 30 (A) NEGATIVE mg/dL   Urobilinogen, UA 0.2 0.0 - 1.0 mg/dL   Nitrite NEGATIVE  NEGATIVE   Leukocytes,Ua NEGATIVE NEGATIVE    Assessment and Plan :   PDMP not  reviewed this encounter.  1. Urinary frequency   2. Hyperglycemia   3. Controlled type 2 diabetes mellitus without complication, without long-term current use of insulin [facility])     Physical exam findings and vital signs stable for outpatient management.  Emphasized need to practice diabetic friendly diet.  Maintain medical compliance.  Follow-up with PCP as soon as possible.  At this time, patient does not have any signs of an acute illness including DKA, ACS, stroke. Counseled patient on potential for adverse effects with medications prescribed/recommended today, ER and return-to-clinic precautions discussed, patient verbalized understanding.    Jaynee Eagles, PA-C 08/31/20 1240

## 2020-10-03 ENCOUNTER — Other Ambulatory Visit: Payer: Self-pay

## 2020-10-03 ENCOUNTER — Ambulatory Visit (INDEPENDENT_AMBULATORY_CARE_PROVIDER_SITE_OTHER): Payer: Medicare Other | Admitting: Ophthalmology

## 2020-10-03 ENCOUNTER — Encounter (INDEPENDENT_AMBULATORY_CARE_PROVIDER_SITE_OTHER): Payer: Self-pay | Admitting: Ophthalmology

## 2020-10-03 DIAGNOSIS — H353111 Nonexudative age-related macular degeneration, right eye, early dry stage: Secondary | ICD-10-CM

## 2020-10-03 DIAGNOSIS — E119 Type 2 diabetes mellitus without complications: Secondary | ICD-10-CM | POA: Diagnosis not present

## 2020-10-03 DIAGNOSIS — H353221 Exudative age-related macular degeneration, left eye, with active choroidal neovascularization: Secondary | ICD-10-CM

## 2020-10-03 DIAGNOSIS — H2513 Age-related nuclear cataract, bilateral: Secondary | ICD-10-CM | POA: Diagnosis not present

## 2020-10-03 MED ORDER — BEVACIZUMAB 2.5 MG/0.1ML IZ SOSY
2.5000 mg | PREFILLED_SYRINGE | INTRAVITREAL | Status: AC | PRN
  Administered 2020-10-03: 2.5 mg via INTRAVITREAL

## 2020-10-03 NOTE — Assessment & Plan Note (Signed)

## 2020-10-03 NOTE — Progress Notes (Signed)
10/03/2020     CHIEF COMPLAINT Patient presents for Retina Follow Up (6 week fu OU and Avastin OS/Pt states, "I think my vision is ok. I still see that shade over my left eye sometimes but that is not new."/A1C:?/LBS:107)   HISTORY OF PRESENT ILLNESS: Peter Mann is a 80 y.o. male who presents to the clinic today for:   HPI     Retina Follow Up           Diagnosis: Wet AMD   Laterality: left eye   Onset: 6 weeks ago   Severity: mild   Duration: 6 weeks   Course: stable   Comments: 6 week fu OU and Avastin OS Pt states, "I think my vision is ok. I still see that shade over my left eye sometimes but that is not new." A1C:? LBS:107       Last edited by Kendra Opitz, COA on 10/03/2020  9:52 AM.      Referring physician: Nolene Ebbs, MD [place],  [place] [id number]  HISTORICAL INFORMATION:   Selected notes from the MEDICAL RECORD NUMBER    Lab Results  Component Value Date   HGBA1C 8.5 (H) 08/23/2015     CURRENT MEDICATIONS: Current Outpatient Medications (Ophthalmic Drugs)  Medication Sig   cycloSPORINE (RESTASIS) 0.05 % ophthalmic emulsion Place 1 drop into the left eye daily.    No current facility-administered medications for this visit. (Ophthalmic Drugs)   Current Outpatient Medications (Other)  Medication Sig   acetaminophen (TYLENOL) 500 MG tablet Take 1,000 mg by mouth every 6 (six) hours as needed for mild pain or headache.   amLODipine (NORVASC) 10 MG tablet Take 10 mg by mouth daily.   aspirin 325 MG tablet Take 1 tablet (325 mg total) by mouth daily.   chlorhexidine (PERIDEX) 0.12 % solution SMARTSIG:15 Milliliter(s) By Mouth Every Morning-Evening   dexlansoprazole (DEXILANT) 60 MG capsule Take 60 mg by mouth daily.   diclofenac sodium (VOLTAREN) 1 % GEL Apply 2 g topically as needed (pain).    Lancets (ONETOUCH DELICA PLUS Q000111Q) MISC    lubiprostone (AMITIZA) 24 MCG capsule Take 24 mcg by mouth 2 (two) times daily with  a meal.   meclizine (ANTIVERT) 25 MG tablet Take 25 mg by mouth 2 (two) times daily.   metoprolol (LOPRESSOR) 50 MG tablet Take 75 mg by mouth 2 (two) times daily.   ONE TOUCH ULTRA TEST test strip    polyethylene glycol (MIRALAX / GLYCOLAX) 17 g packet Take 17 g by mouth daily.   rosuvastatin (CRESTOR) 20 MG tablet Take 20 mg by mouth daily.   saxagliptin HCl (ONGLYZA) 5 MG TABS tablet Take 5 mg by mouth daily.   tamsulosin (FLOMAX) 0.4 MG CAPS capsule Take 0.4 mg by mouth daily.    No current facility-administered medications for this visit. 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  These are often spontaneous but are also often at or near the site  of low location of an injection should to be placed into the eye.  In the absence of direct blunt or severe trauma these will typically resolve on their own spontaneously and have no impact on the vision.    These are very similar to having a bruise in your   Past Surgical History:  Procedure Laterality Date   CHOLECYSTECTOMY N/A 01/30/2014   Procedure: LAPAROSCOPIC CHOLECYSTECTOMY;  Surgeon: Ralene Ok, MD;  Location: [facility] OR;  Service: General;  Laterality: N/A;    FAMILY HISTORY Family History  Problem Relation Age of Onset   Cancer Brother    Cancer Sister     SOCIAL HISTORY Social History   Tobacco Use   Smoking status: Former    Packs/day: 0.25    Types: Cigarettes    Quit date: 11/19/2018    Years since quitting: 1.8   Smokeless tobacco: Never   Tobacco comments:    smoke 3 to 4 cigarettes a  day, 09/08/16 quit 3 wks ago  Substance Use Topics   Alcohol use: No    Alcohol/week: 0.0 standard drinks    Comment: quit 25 years ago though before "drank so much that you couldn't tell b/t alcohol and wine"   Drug use: No         OPHTHALMIC EXAM:  Base Eye Exam     Visual Acuity (ETDRS)       Right Left   Dist [place] 20/30 20/400   Dist ph [place] NI NI         Tonometry (Tonopen, 9:56 AM)       Right Left   Pressure 11 10         Pachymetry (11/30/2019)       Right Left   Thickness 21 18         Pupils       Pupils Dark Light Shape React APD   Right PERRL 3 3 Round Minimal None   Left PERRL 3 3 Round Minimal None         Visual Fields (Counting fingers)       Left Right    Full Full         Extraocular Movement       Right Left    Full Full         Neuro/Psych     Oriented x3: Yes   Mood/Affect: Normal         Dilation     Both eyes: 1.0% Mydriacyl, 2.5% Phenylephrine @ 9:56 AM           Slit Lamp and Fundus Exam     External Exam       Right Left   External Normal Normal         Slit Lamp Exam       Right Left   Lids/Lashes Normal Normal   Conjunctiva/Sclera White and quiet White and quiet   Cornea Clear Clear   Anterior Chamber Deep and quiet Deep and quiet   Iris Round and reactive Round and reactive   Lens 2+ Nuclear sclerosis 2+ Nuclear sclerosis   Anterior Vitreous Normal Normal         Fundus Exam       Right Left   Posterior Vitreous Normal Posterior vitreous detachment   Disc Normal Normal   C/D Ratio 0.35 0.5   Macula Hard drusen, no macular thickening, no exudates Age related macular degeneration, Macular thickening, Hard drusen, Retinal pigment epithelial detachment, no hemorrhage  Vessels no DR no DR   Periphery Normal Normal            IMAGING AND PROCEDURES  Imaging and Procedures for 10/03/20  OCT, Retina - OU - Both Eyes       Right Eye Quality was good. Scan locations included  subfoveal. Central Foveal Thickness: 258. Progression has been stable. Findings include retinal drusen , abnormal foveal contour, no SRF, no IRF.   Left Eye Quality was good. Scan locations included subfoveal. Central Foveal Thickness: 345. Progression has improved. Findings include subretinal fluid, pigment epithelial detachment, choroidal neovascular membrane, intraretinal fluid.   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 10:46 AM. Confirmed correct patient, procedure, site, and patient consented.   Anesthesia Topical anesthesia was used. Anesthetic medications included Akten 3.5%.   Procedure Preparation included Tobramycin 0.3%, 10% betadine to eyelids, 5% betadine to ocular surface. A 30 gauge needle was used.   Injection: 2.5 mg bevacizumab 2.5 MG/0.1ML   Route: Intravitreal, Site: Left Eye   NDC: [phone], Lot: [id number]   Post-op Post injection exam found visual acuity of at least counting fingers. The patient tolerated the procedure well. There were no complications. The patient received written and verbal post procedure care education. Post injection medications were not given.              ASSESSMENT/PLAN:  Exudative age-related macular degeneration of left eye with active choroidal neovascularization (HCC) The nature of wet macular degeneration was discussed with the patient.  Forms of therapy reviewed include the use of Anti-VEGF medications injected painlessly into the eye, as well as other possible treatment modalities, including thermal laser therapy. Fellow eye involvement and risks were discussed with the patient. Upon the finding of wet age related macular degeneration, treatment will be offered. The  treatment regimen is on a treat as needed basis with the intent to treat if necessary and extend interval of exams when possible. On average 1 out of 6 patients do not need lifetime therapy. However, the risk of recurrent disease is high for a lifetime.  Initially monthly, then periodic, examinations and evaluations will determine whether the next treatment is required on the day of the examination.  Chronic active with features of PED, possible RPE rip and chronic subretinal fluid yet stable acuity and stable configuration by and anatomic features repeat Avastin today and follow-up again in 6 weeks  Diabetes mellitus type 2, noninsulin dependent (HCC) No detectable diabetic retinopathy OU  Cataract, nuclear sclerotic, both eyes The nature of cataract was discussed with the patient as well as the elective nature of surgery. The patient was reassured that surgery at a later date does not put the patient at risk for a worse outcome. It was emphasized that the need for surgery is dictated by the patient's quality of life as influenced by the cataract. Patient was instructed to maintain close follow up with their general eye care doctor.     ICD-10-CM   1. Exudative age-related macular degeneration of left eye with active choroidal neovascularization (HCC)  H35.3221 OCT, Retina - OU - Both Eyes    Intravitreal Injection, Pharmacologic Agent - OS - Left Eye    bevacizumab (AVASTIN) SOSY 2.5 mg  2. Diabetes mellitus type 2, noninsulin dependent ([place])  E11.9     3. Cataract, nuclear sclerotic, both eyes  H25.13       1.  OD no signs of CNVM  2.  OU no detectable diabetic retinopathy  3.  OS with chronic active CNVM, vascularized PED with serous subretinal fluid and preserved visual acuity at 6-week interval post Avastin.  We will repeat again today injection and follow-up again in 6 weeks to maintain functioning  Ophthalmic Meds Ordered this visit:  Meds ordered this encounter  Medications    bevacizumab (AVASTIN) SOSY 2.5 mg       Return in about 6 weeks (around 11/14/2020) for dilate, OS, AVASTIN OCT.  There are no Patient Instructions on file for this visit.   Explained the diagnoses, plan, and follow up with the patient and they expressed understanding.  Patient expressed understanding of the importance of proper follow up care.   Clent Demark Sheretha Shadd M.D. Diseases & Surgery of the Retina and Vitreous Retina & Diabetic [place] 10/03/20     Abbreviations: M myopia (nearsighted); A astigmatism; H hyperopia (farsighted); P presbyopia; Mrx spectacle prescription;  CTL contact lenses; OD right eye; OS left eye; OU both eyes  XT exotropia; ET esotropia; PEK punctate epithelial keratitis; PEE punctate epithelial erosions; DES dry eye syndrome; MGD meibomian gland dysfunction; ATs artificial tears; PFAT's preservative free artificial tears; [place] nuclear sclerotic cataract; PSC posterior subcapsular cataract; ERM epi-retinal membrane; PVD posterior vitreous detachment; RD retinal detachment; DM diabetes mellitus; DR diabetic retinopathy; NPDR non-proliferative diabetic retinopathy; PDR proliferative diabetic retinopathy; CSME clinically significant macular edema; DME diabetic macular edema; dbh dot blot hemorrhages; CWS cotton wool spot; POAG primary open angle glaucoma; C/D cup-to-disc ratio; HVF humphrey visual field; GVF goldmann visual field; OCT optical coherence tomography; IOP intraocular pressure; BRVO Branch retinal vein occlusion; CRVO central retinal vein occlusion; CRAO central retinal artery occlusion; BRAO branch retinal artery occlusion; RT retinal tear; SB scleral buckle; PPV pars plana vitrectomy; VH Vitreous hemorrhage; PRP panretinal laser photocoagulation; IVK intravitreal kenalog; VMT vitreomacular traction; MH Macular hole;  NVD neovascularization of the disc; NVE neovascularization elsewhere; AREDS age related eye disease study; ARMD age related macular degeneration; POAG  primary open angle glaucoma; EBMD epithelial/anterior basement membrane dystrophy; ACIOL anterior chamber intraocular lens; IOL intraocular lens; PCIOL posterior chamber intraocular lens; Phaco/IOL phacoemulsification with intraocular lens placement; [place] photorefractive keratectomy; LASIK laser assisted in situ keratomileusis; HTN hypertension; DM diabetes mellitus; COPD chronic obstructive pulmonary disease

## 2020-10-03 NOTE — Assessment & Plan Note (Signed)
Minor no high risk features

## 2020-10-03 NOTE — Assessment & Plan Note (Addendum)
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  Forms of therapy reviewed include the use of Anti-VEGF medications injected painlessly into the eye, as well as other possible treatment modalities, including thermal laser therapy. Fellow eye involvement and risks were discussed with the patient. Upon the finding of wet age related macular degeneration, treatment will be offered. The treatment regimen is on a treat as needed basis with the intent to treat if necessary and extend interval of exams when possible. On average 1 out of 6 patients do not need lifetime therapy. However, the risk of recurrent disease is high for a lifetime.  Initially monthly, then periodic, examinations and evaluations will determine whether the next treatment is required on the day of the examination.  Chronic active with features of PED, possible RPE rip and chronic subretinal fluid yet stable acuity and stable configuration by and anatomic features repeat Avastin today and follow-up again in 6 weeks

## 2020-10-03 NOTE — Assessment & Plan Note (Signed)
No detectable diabetic retinopathy OU 

## 2020-11-14 ENCOUNTER — Encounter (INDEPENDENT_AMBULATORY_CARE_PROVIDER_SITE_OTHER): Payer: Self-pay

## 2020-11-14 ENCOUNTER — Encounter (INDEPENDENT_AMBULATORY_CARE_PROVIDER_SITE_OTHER): Payer: Medicare Other | Admitting: Ophthalmology

## 2020-11-20 ENCOUNTER — Encounter (INDEPENDENT_AMBULATORY_CARE_PROVIDER_SITE_OTHER): Payer: Medicare Other | Admitting: Ophthalmology

## 2020-11-20 ENCOUNTER — Other Ambulatory Visit: Payer: Self-pay

## 2020-11-20 ENCOUNTER — Ambulatory Visit (INDEPENDENT_AMBULATORY_CARE_PROVIDER_SITE_OTHER): Payer: Medicare Other | Admitting: Ophthalmology

## 2020-11-20 ENCOUNTER — Encounter (INDEPENDENT_AMBULATORY_CARE_PROVIDER_SITE_OTHER): Payer: Self-pay | Admitting: Ophthalmology

## 2020-11-20 DIAGNOSIS — H35722 Serous detachment of retinal pigment epithelium, left eye: Secondary | ICD-10-CM | POA: Diagnosis not present

## 2020-11-20 DIAGNOSIS — H353221 Exudative age-related macular degeneration, left eye, with active choroidal neovascularization: Secondary | ICD-10-CM | POA: Diagnosis not present

## 2020-11-20 MED ORDER — BEVACIZUMAB 2.5 MG/0.1ML IZ SOSY
2.5000 mg | PREFILLED_SYRINGE | INTRAVITREAL | Status: AC | PRN
  Administered 2020-11-20: 2.5 mg via INTRAVITREAL

## 2020-11-20 NOTE — Assessment & Plan Note (Signed)
Much less active CNVM with vascularized PED in a subfoveal location at 6-week follow-up interval today (as compared to 9 prior) we will repeat injection today and maintain 6-week follow-up interval

## 2020-11-20 NOTE — Progress Notes (Signed)
11/20/2020     CHIEF COMPLAINT Patient presents for  Chief Complaint  Patient presents with   Retina Follow Up      HISTORY OF PRESENT ILLNESS: Peter Mann is a 80 y.o. male who presents to the clinic today for:   HPI     Retina Follow Up   Patient presents with  Wet AMD.  In left eye.  This started 6 weeks ago.  Duration of 6 weeks.        Comments   6 week f/u OS with OCT and possible Avastin  Pt states vision remains stable and unchanged since previous visit. Pt denies any new floaters or flashes. Pt denies any eye pain.   Type 2 Diabetic. Diagnosed for ~ 17 years. A1c: unsure, "They said it was good." LBS: 155 ( 1 day ago)  Eye Meds: Restasis BID OU      Last edited by Reather Littler, COA on 11/20/2020  2:12 PM.      Referring physician: Nolene Ebbs, MD [place],  [place] [id number]  HISTORICAL INFORMATION:   Selected notes from the MEDICAL RECORD NUMBER    Lab Results  Component Value Date   HGBA1C 8.5 (H) 08/23/2015     CURRENT MEDICATIONS: Current Outpatient Medications (Ophthalmic Drugs)  Medication Sig   cycloSPORINE (RESTASIS) 0.05 % ophthalmic emulsion Place 1 drop into the left eye daily.    No current facility-administered medications for this visit. (Ophthalmic Drugs)   Current Outpatient Medications (Other)  Medication Sig   acetaminophen (TYLENOL) 500 MG tablet Take 1,000 mg by mouth every 6 (six) hours as needed for mild pain or headache.   amLODipine (NORVASC) 10 MG tablet Take 10 mg by mouth daily.   aspirin 325 MG tablet Take 1 tablet (325 mg total) by mouth daily.   chlorhexidine (PERIDEX) 0.12 % solution SMARTSIG:15 Milliliter(s) By Mouth Every Morning-Evening   dexlansoprazole (DEXILANT) 60 MG capsule Take 60 mg by mouth daily.   diclofenac sodium (VOLTAREN) 1 % GEL Apply 2 g topically as needed (pain).    Lancets (ONETOUCH DELICA PLUS Q000111Q) MISC    lubiprostone (AMITIZA) 24 MCG capsule Take 24 mcg  by mouth 2 (two) times daily with a meal.   meclizine (ANTIVERT) 25 MG tablet Take 25 mg by mouth 2 (two) times daily.   metoprolol (LOPRESSOR) 50 MG tablet Take 75 mg by mouth 2 (two) times daily.   ONE TOUCH ULTRA TEST test strip    polyethylene glycol (MIRALAX / GLYCOLAX) 17 g packet Take 17 g by mouth daily.   rosuvastatin (CRESTOR) 20 MG tablet Take 20 mg by mouth daily.   saxagliptin HCl (ONGLYZA) 5 MG TABS tablet Take 5 mg by mouth daily.   tamsulosin (FLOMAX) 0.4 MG CAPS capsule Take 0.4 mg by mouth daily.    No current facility-administered medications for this visit. 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)    Subconjunctival hemorrhage of left eye 07/18/2019   The condition of the involved eye is that of a subconjunctival hemorrhage.  These are often spontaneous but are also often at  or near the site of low location of an injection should to be placed into the eye.  In the absence of direct blunt or severe trauma these will typically resolve on their own spontaneously and have no impact on the vision.    These are very similar to having a bruise in your   Past Surgical History:  Procedure Laterality Date   CHOLECYSTECTOMY N/A 01/30/2014   Procedure: LAPAROSCOPIC CHOLECYSTECTOMY;  Surgeon: Ralene Ok, MD;  Location: [place];  Service: General;  Laterality: N/A;    FAMILY HISTORY Family History  Problem Relation Age of Onset   Cancer Brother    Cancer Sister     SOCIAL HISTORY Social History   Tobacco Use   Smoking status: Former    Packs/day: 0.25    Types: Cigarettes    Quit date: 11/19/2018    Years since quitting: 2.0   Smokeless tobacco: Never   Tobacco  comments:    smoke 3 to 4 cigarettes a day, 09/08/16 quit 3 wks ago  Substance Use Topics   Alcohol use: No    Alcohol/week: 0.0 standard drinks    Comment: quit 25 years ago though before "drank so much that you couldn't tell b/t alcohol and wine"   Drug use: No         OPHTHALMIC EXAM:  Base Eye Exam     Visual Acuity (ETDRS)       Right Left   Dist [place] 20/30 -2 20/150   Dist ph [place] 20/25 -2 NI         Tonometry (Tonopen, 2:20 PM)       Right Left   Pressure 19 17         Pachymetry (11/30/2019)       Right Left   Thickness 21 18         Pupils       Pupils Dark Light Shape React APD   Right PERRL 3 2 Round Brisk None   Left PERRL 3 2 Round Brisk None         Visual Fields (Counting fingers)       Left Right    Full          Extraocular Movement       Right Left    Full, Ortho Full, Ortho         Neuro/Psych     Oriented x3: Yes   Mood/Affect: Normal         Dilation     Left eye: 1.0% Mydriacyl, 2.5% Phenylephrine @ 2:20 PM           Slit Lamp and Fundus Exam     External Exam       Right Left   External Normal Normal         Slit Lamp Exam       Right Left   Lids/Lashes Normal Normal   Conjunctiva/Sclera White and quiet White and quiet   Cornea Clear Clear   Anterior Chamber Deep and quiet Deep and quiet   Iris Round and reactive Round and reactive   Lens 2+ Nuclear sclerosis 2+ Nuclear sclerosis   Anterior Vitreous Normal Normal         Fundus Exam       Right Left   Posterior Vitreous  Posterior vitreous detachment   Disc  Normal   C/D Ratio  0.5   Macula  Age related macular degeneration, Macular thickening, Hard drusen, Retinal pigment epithelial detachment,  no hemorrhage   Vessels  no DR   Periphery  Normal            IMAGING AND PROCEDURES  Imaging and Procedures for 11/20/20  OCT, Retina - OU - Both Eyes       Right Eye Quality was good. Scan locations included subfoveal. Central  Foveal Thickness: 259. Progression has been stable. Findings include retinal drusen , abnormal foveal contour, no SRF, no IRF.   Left Eye Quality was good. Scan locations included subfoveal. Central Foveal Thickness: 306. Progression has improved. Findings include subretinal fluid, pigment epithelial detachment, choroidal neovascular membrane, intraretinal fluid.   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 3:00 PM. Confirmed correct patient, procedure, site, and patient consented.   Anesthesia Topical anesthesia was used. Anesthetic medications included Akten 3.5%.   Procedure Preparation included Tobramycin 0.3%, 10% betadine to eyelids, 5% betadine to ocular surface. A 30 gauge needle was used.   Injection: 2.5 mg bevacizumab 2.5 MG/0.1ML   Route: Intravitreal, Site: Left Eye   NDC: [phone], Lot: [id number]   Post-op Post injection exam found visual acuity of at least counting fingers. The patient tolerated the procedure well. There were no complications. The patient received written and verbal post procedure care education. Post injection medications were not given.              ASSESSMENT/PLAN:  Exudative age-related macular degeneration of left eye with active choroidal neovascularization (HCC) Much less active CNVM with vascularized PED in a subfoveal location at 6-week follow-up interval today (as compared to 9 prior) we will repeat injection today and maintain 6-week follow-up interval  Serous detachment of retinal pigment epithelium of left eye Smaller regional component of the wet AMD disease at 6-week interval.     ICD-10-CM   1. Exudative age-related macular degeneration of left eye with active choroidal  neovascularization (HCC)  H35.3221 OCT, Retina - OU - Both Eyes    Intravitreal Injection, Pharmacologic Agent - OS - Left Eye    bevacizumab (AVASTIN) SOSY 2.5 mg    2. Serous detachment of retinal pigment epithelium of left eye  H35.722       OS, vastly improved macular anatomy and less disease activity of wet AMD at 6-week interval as compared to prior 9-week interval.  Repeat injection intravitreal Avastin now and follow-up again in 6 weeks  2.  3.  Ophthalmic Meds Ordered this visit:  Meds ordered this encounter  Medications   bevacizumab (AVASTIN) SOSY 2.5 mg       Return in about 6 weeks (around 01/01/2021) for dilate, OS, AVASTIN OCT.  There are no Patient Instructions on file for this visit.   Explained the diagnoses, plan, and follow up with the patient and they expressed understanding.  Patient expressed understanding of the importance of proper follow up care.   Clent Demark Terrin Meddaugh M.D. Diseases & Surgery of the Retina and Vitreous Retina & Diabetic [place] 11/20/20     Abbreviations: M myopia (nearsighted); A astigmatism; H hyperopia (farsighted); P presbyopia; Mrx spectacle prescription;  CTL contact lenses; OD right eye; OS left eye; OU both eyes  XT exotropia; ET esotropia; PEK punctate epithelial keratitis; PEE punctate epithelial erosions; DES dry  eye syndrome; MGD meibomian gland dysfunction; ATs artificial tears; PFAT's preservative free artificial tears; [place] nuclear sclerotic cataract; PSC posterior subcapsular cataract; ERM epi-retinal membrane; PVD posterior vitreous detachment; RD retinal detachment; DM diabetes mellitus; DR diabetic retinopathy; NPDR non-proliferative diabetic retinopathy; PDR proliferative diabetic retinopathy; CSME clinically significant macular edema; DME diabetic macular edema; dbh dot blot hemorrhages; CWS cotton wool spot; POAG primary open angle glaucoma; C/D cup-to-disc ratio; HVF humphrey visual field; GVF goldmann visual field;  OCT optical coherence tomography; IOP intraocular pressure; BRVO Branch retinal vein occlusion; CRVO central retinal vein occlusion; CRAO central retinal artery occlusion; BRAO branch retinal artery occlusion; RT retinal tear; SB scleral buckle; PPV pars plana vitrectomy; VH Vitreous hemorrhage; PRP panretinal laser photocoagulation; IVK intravitreal kenalog; VMT vitreomacular traction; MH Macular hole;  NVD neovascularization of the disc; NVE neovascularization elsewhere; AREDS age related eye disease study; ARMD age related macular degeneration; POAG primary open angle glaucoma; EBMD epithelial/anterior basement membrane dystrophy; ACIOL anterior chamber intraocular lens; IOL intraocular lens; PCIOL posterior chamber intraocular lens; Phaco/IOL phacoemulsification with intraocular lens placement; [place] photorefractive keratectomy; LASIK laser assisted in situ keratomileusis; HTN hypertension; DM diabetes mellitus; COPD chronic obstructive pulmonary disease

## 2020-11-20 NOTE — Assessment & Plan Note (Signed)
Smaller regional component of the wet AMD disease at 6-week interval.

## 2021-01-01 ENCOUNTER — Ambulatory Visit (INDEPENDENT_AMBULATORY_CARE_PROVIDER_SITE_OTHER): Payer: Medicare Other | Admitting: Ophthalmology

## 2021-01-01 ENCOUNTER — Encounter (INDEPENDENT_AMBULATORY_CARE_PROVIDER_SITE_OTHER): Payer: Self-pay | Admitting: Ophthalmology

## 2021-01-01 ENCOUNTER — Other Ambulatory Visit: Payer: Self-pay

## 2021-01-01 DIAGNOSIS — H2513 Age-related nuclear cataract, bilateral: Secondary | ICD-10-CM

## 2021-01-01 DIAGNOSIS — H353221 Exudative age-related macular degeneration, left eye, with active choroidal neovascularization: Secondary | ICD-10-CM

## 2021-01-01 MED ORDER — BEVACIZUMAB 2.5 MG/0.1ML IZ SOSY
2.5000 mg | PREFILLED_SYRINGE | INTRAVITREAL | Status: AC | PRN
  Administered 2021-01-01: 2.5 mg via INTRAVITREAL

## 2021-01-01 NOTE — Assessment & Plan Note (Signed)
Progressive NSC cataract now left eye worse than right eye.  Will need to have referral to cataract evaluation with general ophthalmology in the near future

## 2021-01-01 NOTE — Progress Notes (Signed)
01/01/2021     CHIEF COMPLAINT Patient presents for  Chief Complaint  Patient presents with   Retina Follow Up      HISTORY OF PRESENT ILLNESS: Peter Mann is a 80 y.o. male who presents to the clinic today for:   HPI     Retina Follow Up   Patient presents with  Wet AMD.  In left eye.  This started 6 weeks ago.  Duration of 6 weeks.        Comments   6 week fu OS oct avastin OS. Patient states vision is stable and unchanged since last visit. Denies any new floaters or FOL.      Last edited by Laurin Coder on 01/01/2021  9:51 AM.      Referring physician: Nolene Ebbs, MD [place],  [place] [address]  HISTORICAL INFORMATION:   Selected notes from the MEDICAL RECORD NUMBER    Lab Results  Component Value Date   HGBA1C 8.5 (H) 08/23/2015     CURRENT MEDICATIONS: Current Outpatient Medications (Ophthalmic Drugs)  Medication Sig   cycloSPORINE (RESTASIS) 0.05 % ophthalmic emulsion Place 1 drop into the left eye daily.    No current facility-administered medications for this visit. (Ophthalmic Drugs)   Current Outpatient Medications (Other)  Medication Sig   acetaminophen (TYLENOL) 500 MG tablet Take 1,000 mg by mouth every 6 (six) hours as needed for mild pain or headache.   amLODipine (NORVASC) 10 MG tablet Take 10 mg by mouth daily.   aspirin 325 MG tablet Take 1 tablet (325 mg total) by mouth daily.   chlorhexidine (PERIDEX) 0.12 % solution SMARTSIG:15 Milliliter(s) By Mouth Every Morning-Evening   dexlansoprazole (DEXILANT) 60 MG capsule Take 60 mg by mouth daily.   diclofenac sodium (VOLTAREN) 1 % GEL Apply 2 g topically as needed (pain).    Lancets (ONETOUCH DELICA PLUS [id number]) MISC    lubiprostone (AMITIZA) 24 MCG capsule Take 24 mcg by mouth 2 (two) times daily with a meal.   meclizine (ANTIVERT) 25 MG tablet Take 25 mg by mouth 2 (two) times daily.   metoprolol (LOPRESSOR) 50 MG tablet Take 75 mg by mouth 2 (two) times  daily.   ONE TOUCH ULTRA TEST test strip    polyethylene glycol (MIRALAX / GLYCOLAX) 17 g packet Take 17 g by mouth daily.   rosuvastatin (CRESTOR) 20 MG tablet Take 20 mg by mouth daily.   saxagliptin HCl (ONGLYZA) 5 MG TABS tablet Take 5 mg by mouth daily.   tamsulosin (FLOMAX) 0.4 MG CAPS capsule Take 0.4 mg by mouth daily.    No current facility-administered medications for this visit. 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  These are often spontaneous but are also often at or near the site of low location of an injection should to be placed into the eye.  In the absence of direct blunt or severe trauma these will typically resolve on their own spontaneously and have no impact  on the vision.    These are very similar to having a bruise in your   Past Surgical History:  Procedure Laterality Date   CHOLECYSTECTOMY N/A 01/30/2014   Procedure: LAPAROSCOPIC CHOLECYSTECTOMY;  Surgeon: Ralene Ok, MD;  Location: [facility];  Service: General;  Laterality: N/A;    FAMILY HISTORY Family History  Problem Relation Age of Onset   Cancer Brother    Cancer Sister     SOCIAL HISTORY Social History   Tobacco Use   Smoking status: Former    Packs/day: 0.25    Types: Cigarettes    Quit date: 11/19/2018    Years since quitting: 2.1   Smokeless tobacco: Never   Tobacco comments:    smoke 3 to 4 cigarettes a day, 09/08/16 quit 3 wks ago  Substance Use Topics   Alcohol use: No    Alcohol/week: 0.0 standard drinks    Comment: quit 25 years ago though before "drank  so much that you couldn't tell b/t alcohol and wine"   Drug use: No         OPHTHALMIC EXAM:  Base Eye Exam     Visual Acuity (ETDRS)       Right Left   Dist [place] 20/30 -2 20/400         Tonometry (Tonopen, 9:56 AM)       Right Left   Pressure 12 16         Pachymetry (11/30/2019)       Right Left   Thickness 21 18         Pupils       Pupils Dark Light Shape React APD   Right PERRL 3 2 Round Minimal None   Left PERRL 3 2 Round Minimal None         Extraocular Movement       Right Left    Full Full         Neuro/Psych     Oriented x3: Yes   Mood/Affect: Normal         Dilation     Left eye: 1.0% Mydriacyl, 2.5% Phenylephrine @ 9:56 AM           Slit Lamp and Fundus Exam     External Exam       Right Left   External Normal Normal         Slit Lamp Exam       Right Left   Lids/Lashes Normal Normal   Conjunctiva/Sclera White and quiet White and quiet   Cornea Clear Clear   Anterior Chamber Deep and quiet Deep and quiet   Iris Round and reactive Round and reactive   Lens 2+ Nuclear sclerosis 3+ Nuclear sclerosis   Anterior Vitreous Normal Normal         Fundus Exam       Right Left   Posterior Vitreous  Posterior vitreous detachment   Disc  Normal   C/D Ratio  0.5   Macula  Age related macular degeneration, less macular thickening, Hard drusen, Retinal pigment epithelial detachment, no hemorrhage   Vessels  no DR   Periphery  Normal            IMAGING AND PROCEDURES  Imaging and Procedures for 01/01/21  Intravitreal Injection, Pharmacologic Agent - OS - Left Eye       Time Out 01/01/2021. 10:10 AM. Confirmed correct patient, procedure, site, and patient consented.   Anesthesia Topical anesthesia was used. Anesthetic medications included Lidocaine  4%.   Procedure Preparation included Tobramycin 0.3%, 10% betadine to eyelids, 5% betadine to ocular surface. A 30 gauge needle was used.   Injection: 2.5  mg bevacizumab 2.5 MG/0.1ML   Route: Intravitreal, Site: Left Eye   NDC: [phone], Lot: [id number]   Post-op Post injection exam found visual acuity of at least counting fingers. The patient tolerated the procedure well. There were no complications. The patient received written and verbal post procedure care education. Post injection medications included ocuflox.      OCT, Retina - OU - Both Eyes       Right Eye Quality was good. Scan locations included subfoveal. Central Foveal Thickness: 259. Progression has been stable. Findings include retinal drusen , abnormal foveal contour, no SRF, no IRF.   Left Eye Quality was good. Scan locations included subfoveal. Central Foveal Thickness: 270. Progression has improved. Findings include subretinal fluid, pigment epithelial detachment, choroidal neovascular membrane, intraretinal fluid, abnormal foveal contour.   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  Incidental medial opacity OU               ASSESSMENT/PLAN:  Cataract, nuclear sclerotic, both eyes Progressive NSC cataract now left eye worse than right eye.  Will need to have referral to cataract evaluation with general ophthalmology in the near future  Exudative age-related macular degeneration of left eye with active choroidal neovascularization [facility]) Recent July 2022, significant subretinal intraretinal fluid has improved substantially today at 6-week interval post most recent injection.  We will repeat injection Avastin OS today and maintain current interval examination     ICD-10-CM   1. Exudative age-related macular degeneration of left eye with active choroidal neovascularization (HCC)  H35.3221 Intravitreal Injection, Pharmacologic Agent - OS - Left Eye    OCT, Retina - OU - Both Eyes    bevacizumab (AVASTIN) SOSY 2.5 mg    2. Cataract, nuclear sclerotic, both eyes  H25.13       1.  OS with chronic active  subfoveal CNVM but vastly improved this visit as compared to the prior to post injection Avastin 11/20/2020.  2.  We will repeat injection OS today to maintain and follow-up again in 6 weeks  3.  Smoker sclerotic cataract progressing OU hampering vision as well as visualization medically over the posterior pole in the macular regions will need cataract evaluation  Refer to [facility]  Ophthalmic Meds Ordered this visit:  Meds ordered this encounter  Medications   bevacizumab (AVASTIN) SOSY 2.5 mg       Return in about 6 weeks (around 02/12/2021) for DILATE OU, AVASTIN OCT, OS.  There are no Patient Instructions on file for this visit.   Explained the diagnoses, plan, and follow up with the patient and they expressed understanding.  Patient expressed understanding of the importance of proper follow up care.   Clent Demark Hezikiah Retzloff M.D. Diseases & Surgery of the Retina and Vitreous Retina & Diabetic [place] 01/01/21     Abbreviations: M myopia (nearsighted); A astigmatism; H hyperopia (farsighted); P presbyopia; Mrx spectacle prescription;  CTL contact lenses; OD right eye; OS left eye; OU both eyes  XT exotropia; ET esotropia; PEK punctate epithelial keratitis; PEE punctate epithelial erosions; DES dry eye syndrome; MGD meibomian gland dysfunction; ATs artificial tears; PFAT's preservative free artificial tears; [place] nuclear sclerotic cataract; PSC posterior subcapsular cataract; ERM epi-retinal membrane; PVD posterior vitreous detachment; RD retinal detachment; DM diabetes mellitus; DR diabetic retinopathy; NPDR  non-proliferative diabetic retinopathy; PDR proliferative diabetic retinopathy; CSME clinically significant macular edema; DME diabetic macular edema; dbh dot blot hemorrhages; CWS cotton wool spot; POAG primary open angle glaucoma; C/D cup-to-disc ratio; HVF humphrey visual field; GVF goldmann visual field; OCT optical coherence tomography; IOP intraocular pressure; BRVO Branch  retinal vein occlusion; CRVO central retinal vein occlusion; CRAO central retinal artery occlusion; BRAO branch retinal artery occlusion; RT retinal tear; SB scleral buckle; PPV pars plana vitrectomy; VH Vitreous hemorrhage; PRP panretinal laser photocoagulation; IVK intravitreal kenalog; VMT vitreomacular traction; MH Macular hole;  NVD neovascularization of the disc; NVE neovascularization elsewhere; AREDS age related eye disease study; ARMD age related macular degeneration; POAG primary open angle glaucoma; EBMD epithelial/anterior basement membrane dystrophy; ACIOL anterior chamber intraocular lens; IOL intraocular lens; PCIOL posterior chamber intraocular lens; Phaco/IOL phacoemulsification with intraocular lens placement; [place] photorefractive keratectomy; LASIK laser assisted in situ keratomileusis; HTN hypertension; DM diabetes mellitus; COPD chronic obstructive pulmonary disease

## 2021-01-01 NOTE — Assessment & Plan Note (Signed)
Recent July 2022, significant subretinal intraretinal fluid has improved substantially today at 6-week interval post most recent injection.  We will repeat injection Avastin OS today and maintain current interval examination

## 2021-02-12 ENCOUNTER — Other Ambulatory Visit: Payer: Self-pay

## 2021-02-12 ENCOUNTER — Ambulatory Visit (INDEPENDENT_AMBULATORY_CARE_PROVIDER_SITE_OTHER): Payer: Medicare Other | Admitting: Ophthalmology

## 2021-02-12 ENCOUNTER — Encounter (INDEPENDENT_AMBULATORY_CARE_PROVIDER_SITE_OTHER): Payer: Self-pay | Admitting: Ophthalmology

## 2021-02-12 ENCOUNTER — Encounter (INDEPENDENT_AMBULATORY_CARE_PROVIDER_SITE_OTHER): Payer: Medicare Other | Admitting: Ophthalmology

## 2021-02-12 DIAGNOSIS — H2513 Age-related nuclear cataract, bilateral: Secondary | ICD-10-CM

## 2021-02-12 DIAGNOSIS — H353221 Exudative age-related macular degeneration, left eye, with active choroidal neovascularization: Secondary | ICD-10-CM

## 2021-02-12 MED ORDER — BEVACIZUMAB 2.5 MG/0.1ML IZ SOSY
2.5000 mg | PREFILLED_SYRINGE | INTRAVITREAL | Status: AC | PRN
  Administered 2021-02-12: 2.5 mg via INTRAVITREAL

## 2021-02-12 NOTE — Progress Notes (Signed)
02/12/2021     CHIEF COMPLAINT Patient presents for  Chief Complaint  Patient presents with   Retina Follow Up      HISTORY OF PRESENT ILLNESS: Peter Mann is a 80 y.o. male who presents to the clinic today for:   HPI     Retina Follow Up   Patient presents with  Other.  In left eye.  This started 6 weeks ago.  Duration of 6 weeks.        Comments   6 week fu OU oct Avastin OS. Patient states vision is stable and unchanged since last visit. Denies any new floaters or FOL. Pt has cataract surgery scheduled 02/22/2021 for the left eye.      Last edited by Laurin Coder on 02/12/2021  9:58 AM.      Referring physician: Marylynn Pearson, MD [place],  [place] [address]  HISTORICAL INFORMATION:   Selected notes from the MEDICAL RECORD NUMBER    Lab Results  Component Value Date   HGBA1C 8.5 (H) 08/23/2015     CURRENT MEDICATIONS: Current Outpatient Medications (Ophthalmic Drugs)  Medication Sig   cycloSPORINE (RESTASIS) 0.05 % ophthalmic emulsion Place 1 drop into the left eye daily.    No current facility-administered medications for this visit. (Ophthalmic Drugs)   Current Outpatient Medications (Other)  Medication Sig   acetaminophen (TYLENOL) 500 MG tablet Take 1,000 mg by mouth every 6 (six) hours as needed for mild pain or headache.   amLODipine (NORVASC) 10 MG tablet Take 10 mg by mouth daily.   aspirin 325 MG tablet Take 1 tablet (325 mg total) by mouth daily.   chlorhexidine (PERIDEX) 0.12 % solution SMARTSIG:15 Milliliter(s) By Mouth Every Morning-Evening   dexlansoprazole (DEXILANT) 60 MG capsule Take 60 mg by mouth daily.   diclofenac sodium (VOLTAREN) 1 % GEL Apply 2 g topically as needed (pain).    Lancets (ONETOUCH DELICA PLUS [id number]) MISC    lubiprostone (AMITIZA) 24 MCG capsule Take 24 mcg by mouth 2 (two) times daily with a meal.   meclizine (ANTIVERT) 25 MG tablet Take 25 mg by mouth 2 (two) times daily.    metoprolol (LOPRESSOR) 50 MG tablet Take 75 mg by mouth 2 (two) times daily.   ONE TOUCH ULTRA TEST test strip    polyethylene glycol (MIRALAX / GLYCOLAX) 17 g packet Take 17 g by mouth daily.   rosuvastatin (CRESTOR) 20 MG tablet Take 20 mg by mouth daily.   saxagliptin HCl (ONGLYZA) 5 MG TABS tablet Take 5 mg by mouth daily.   tamsulosin (FLOMAX) 0.4 MG CAPS capsule Take 0.4 mg by mouth daily.    No current facility-administered medications for this visit. 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  These are often spontaneous but are also often at or near the site of low location of an injection should to be placed into the eye.  In the absence of direct blunt or severe trauma  these will typically resolve on their own spontaneously and have no impact on the vision.    These are very similar to having a bruise in your   Past Surgical History:  Procedure Laterality Date   CHOLECYSTECTOMY N/A 01/30/2014   Procedure: LAPAROSCOPIC CHOLECYSTECTOMY;  Surgeon: Ralene Ok, MD;  Location: [facility];  Service: General;  Laterality: N/A;    FAMILY HISTORY Family History  Problem Relation Age of Onset   Cancer Brother    Cancer Sister     SOCIAL HISTORY Social History   Tobacco Use   Smoking status: Former    Packs/day: 0.25    Types: Cigarettes    Quit date: 11/19/2018    Years since quitting: 2.2   Smokeless tobacco: Never   Tobacco comments:    smoke 3 to 4 cigarettes a day, 09/08/16 quit 3 wks ago  Substance Use Topics   Alcohol use: No    Alcohol/week: 0.0  standard drinks    Comment: quit 25 years ago though before "drank so much that you couldn't tell b/t alcohol and wine"   Drug use: No         OPHTHALMIC EXAM:  Base Eye Exam     Visual Acuity (ETDRS)       Right Left   Dist [place] 20/30 -2 20/400   Dist ph [place]  20/200         Tonometry (Tonopen, 10:02 AM)       Right Left   Pressure 13 17         Pachymetry (11/30/2019)       Right Left   Thickness 21 18         Pupils       Pupils Dark Light React APD   Right PERRL 3 2 Minimal None   Left PERRL 3 2 Minimal None         Extraocular Movement       Right Left    Full Full         Neuro/Psych     Oriented x3: Yes   Mood/Affect: Normal         Dilation     Both eyes: 1.0% Mydriacyl, 2.5% Phenylephrine @ 10:02 AM           Slit Lamp and Fundus Exam     External Exam       Right Left   External Normal Normal         Slit Lamp Exam       Right Left   Lids/Lashes Normal Normal   Conjunctiva/Sclera White and quiet White and quiet   Cornea Clear Clear   Anterior Chamber Deep and quiet Deep and quiet   Iris Round and reactive Round and reactive   Lens 2+ Nuclear sclerosis 3+ Nuclear sclerosis   Anterior Vitreous Normal Normal         Fundus Exam       Right Left   Posterior Vitreous Normal Posterior vitreous detachment   Disc Normal Normal   C/D Ratio 0.35 0.5   Macula Hard drusen, no macular thickening, no exudates Age related macular degeneration, less macular thickening, Hard drusen, Retinal pigment epithelial detachment, no hemorrhage   Vessels no DR no DR   Periphery Normal Normal            IMAGING AND PROCEDURES  Imaging and Procedures for 02/12/21  Intravitreal Injection, Pharmacologic Agent - OS - Left Eye       Time  Out 02/12/2021. 11:06 AM. Confirmed correct patient, procedure, site, and patient consented.   Anesthesia Topical anesthesia was used. Anesthetic medications included Lidocaine 4%.    Procedure Preparation included Tobramycin 0.3%, 10% betadine to eyelids, 5% betadine to ocular surface. A 30 gauge needle was used.   Injection: 2.5 mg bevacizumab 2.5 MG/0.1ML   Route: Intravitreal, Site: Left Eye   NDC: [id number], Lot: [id number]   Post-op Post injection exam found visual acuity of at least counting fingers. The patient tolerated the procedure well. There were no complications. The patient received written and verbal post procedure care education. Post injection medications included ocuflox.      OCT, Retina - OU - Both Eyes       Right Eye Quality was good. Scan locations included subfoveal. Central Foveal Thickness: 257. Progression has been stable. Findings include retinal drusen , abnormal foveal contour, no SRF, no IRF.   Left Eye Quality was good. Scan locations included subfoveal. Central Foveal Thickness: 205. Progression has improved. Findings include subretinal fluid, pigment epithelial detachment, choroidal neovascular membrane, intraretinal fluid, abnormal foveal contour.   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  Patient shows me paperwork saying that he has cataract surgery scheduled in the coming week or 2 with [facility] but now is saying that he also has an appoint with Dr. Marylynn Pearson tomorrow.  I have discussed with the patient it is his personal choice as to who to proceed with cataract surgery, because the eyes can proceed with at any time and a chance to improve acuity based upon his retinal findings  Exudative age-related macular degeneration of left eye with active choroidal neovascularization ([place]) Much improved OS  macular findings today.  Large subfoveal vascularized PED and serous retinal detachment has finally improved on intravitreal Avastin.  Near normal macular anatomy with some outer retinal scarring limits acuity OS.  Repeat injection Avastin today follow-up again in 6 weeks     ICD-10-CM   1. Exudative age-related macular degeneration of left eye with active choroidal neovascularization (HCC)  H35.3221 Intravitreal Injection, Pharmacologic Agent - OS - Left Eye    OCT, Retina - OU - Both Eyes    bevacizumab (AVASTIN) SOSY 2.5 mg    2. 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  Now with near resolution of disease found on examination today 1.  OS, repeat intravitreal Avastin OS in order to maintain improved macular findings and stability of acuity. 2.  Okay to proceed with cath extraction with intraocular lens placement at any time  3.  No signs of CNVM OD  Ophthalmic Meds Ordered this visit:  Meds ordered this encounter  Medications   bevacizumab (AVASTIN) SOSY 2.5 mg       Return in about 8 weeks (around 04/09/2021) for dilate, OS, AVASTIN OCT.  There are no Patient Instructions on file for this visit.   Explained the diagnoses, plan, and follow up with the  patient and they expressed understanding.  Patient expressed understanding of the importance of proper follow up care.   Clent Demark Lyndel Sarate M.D. Diseases & Surgery of the Retina and Vitreous Retina & Diabetic [place] 02/12/21     Abbreviations: M myopia (nearsighted); A astigmatism; H hyperopia (farsighted); P presbyopia; Mrx spectacle prescription;  CTL contact lenses; OD right eye; OS left eye; OU both eyes  XT exotropia; ET esotropia; PEK punctate epithelial keratitis; PEE punctate epithelial erosions; DES dry eye  syndrome; MGD meibomian gland dysfunction; ATs artificial tears; PFAT's preservative free artificial tears; [place] nuclear sclerotic cataract; PSC posterior subcapsular cataract; ERM epi-retinal membrane; PVD posterior vitreous detachment; RD retinal detachment; DM diabetes mellitus; DR diabetic retinopathy; NPDR non-proliferative diabetic retinopathy; PDR proliferative diabetic retinopathy; CSME clinically significant macular edema; DME diabetic macular edema; dbh dot blot hemorrhages; CWS cotton wool spot; POAG primary open angle glaucoma; C/D cup-to-disc ratio; HVF humphrey visual field; GVF goldmann visual field; OCT optical coherence tomography; IOP intraocular pressure; BRVO Branch retinal vein occlusion; CRVO central retinal vein occlusion; CRAO central retinal artery occlusion; BRAO branch retinal artery occlusion; RT retinal tear; SB scleral buckle; PPV pars plana vitrectomy; VH Vitreous hemorrhage; PRP panretinal laser photocoagulation; IVK intravitreal kenalog; VMT vitreomacular traction; MH Macular hole;  NVD neovascularization of the disc; NVE neovascularization elsewhere; AREDS age related eye disease study; ARMD age related macular degeneration; POAG primary open angle glaucoma; EBMD epithelial/anterior basement membrane dystrophy; ACIOL anterior chamber intraocular lens; IOL intraocular lens; PCIOL posterior chamber intraocular lens; Phaco/IOL phacoemulsification with intraocular lens placement; [place] photorefractive keratectomy; LASIK laser assisted in situ keratomileusis; HTN hypertension; DM diabetes mellitus; COPD chronic obstructive pulmonary disease

## 2021-02-12 NOTE — Assessment & Plan Note (Addendum)
Okay to proceed with cataract surgery OU at any time.  Patient shows me paperwork saying that he has cataract surgery scheduled in the coming week or 2 with [facility] but now is saying that he also has an appoint with Dr. Marylynn Pearson tomorrow.  I have discussed with the patient it is his personal choice as to who to proceed with cataract surgery, because the eyes can proceed with at any time and a chance to improve acuity based upon his retinal findings

## 2021-02-12 NOTE — Assessment & Plan Note (Signed)
Much improved OS macular findings today.  Large subfoveal vascularized PED and serous retinal detachment has finally improved on intravitreal Avastin.  Near normal macular anatomy with some outer retinal scarring limits acuity OS.  Repeat injection Avastin today follow-up again in 6 weeks

## 2021-02-20 ENCOUNTER — Encounter: Payer: Self-pay | Admitting: Cardiovascular Disease

## 2021-02-20 ENCOUNTER — Ambulatory Visit (INDEPENDENT_AMBULATORY_CARE_PROVIDER_SITE_OTHER): Payer: Medicare Other | Admitting: Cardiovascular Disease

## 2021-02-20 ENCOUNTER — Other Ambulatory Visit: Payer: Self-pay

## 2021-02-20 DIAGNOSIS — R0789 Other chest pain: Secondary | ICD-10-CM | POA: Diagnosis not present

## 2021-02-20 DIAGNOSIS — E782 Mixed hyperlipidemia: Secondary | ICD-10-CM

## 2021-02-20 DIAGNOSIS — I10 Essential (primary) hypertension: Secondary | ICD-10-CM | POA: Diagnosis not present

## 2021-02-20 DIAGNOSIS — F17200 Nicotine dependence, unspecified, uncomplicated: Secondary | ICD-10-CM | POA: Diagnosis not present

## 2021-02-20 MED ORDER — METOPROLOL TARTRATE 50 MG PO TABS: 50.0000 mg | ORAL_TABLET | Freq: Two times a day (BID) | ORAL | 3 refills | Status: DC

## 2021-02-20 NOTE — Assessment & Plan Note (Signed)
History of ongoing tobacco abuse but only smoking "occasionally".  He has smoked over 100 pack years in the past.

## 2021-02-20 NOTE — Progress Notes (Signed)
02/20/2021 Peter Mann   [birth year]  [id number]  Primary Physician Nolene Ebbs, MD Primary Cardiologist: Lorretta Harp MD Lupe Carney, [place]  HPI:  Peter Mann is a 80 y.o.  mildly overweight separated African American male father of 2 children referred by Dr. Pierce Crane for cardiovascular evaluation because of chest pain. I last saw him in the office 02/21/2020. He is retired from working [employer], systems and [employer]. His cardiac risk factor profile is positive for 100-pack-year history of tobacco abuse still continuing to smoke off and on. History of hypertension and dyslipidemia. He also has a history of prostate cancer. Because of chest pain several years ago he underwent Myoview stress testing which was normal at that time (03/11/13). He was recently admitted with a stroke 08/23/15. CT scan showed acute infarct of the right posterior frontal lobe. His aspirin was increased to full dose and he was scheduled for a TEE and loop recorder implantation however he left prior to having this performed .   Since I saw him a year ago he is remained stable.  He continues to smoke although infrequently.  He denies chest pain or shortness of breath, although he did have 1 episode of chest pain several weeks ago that resolved with antacids.   Current Meds  Medication Sig   acetaminophen (TYLENOL) 500 MG tablet Take 1,000 mg by mouth every 6 (six) hours as needed for mild pain or headache.   amLODipine (NORVASC) 10 MG tablet Take 10 mg by mouth daily.   aspirin 325 MG tablet Take 1 tablet (325 mg total) by mouth daily.   chlorhexidine (PERIDEX) 0.12 % solution SMARTSIG:15 Milliliter(s) By Mouth Every Morning-Evening   cycloSPORINE (RESTASIS) 0.05 % ophthalmic emulsion Place 1 drop into the left eye daily.    dexlansoprazole (DEXILANT) 60 MG capsule Take 60 mg by mouth daily.   diclofenac sodium (VOLTAREN) 1 % GEL Apply 2 g topically as needed (pain).    Lancets (ONETOUCH DELICA PLUS  [id number]) MISC    lubiprostone (AMITIZA) 24 MCG capsule Take 24 mcg by mouth 2 (two) times daily with a meal.   meclizine (ANTIVERT) 25 MG tablet Take 25 mg by mouth 2 (two) times daily.   metoprolol (LOPRESSOR) 50 MG tablet Take 75 mg by mouth 2 (two) times daily.   ONE TOUCH ULTRA TEST test strip    polyethylene glycol (MIRALAX / GLYCOLAX) 17 g packet Take 17 g by mouth daily.   rosuvastatin (CRESTOR) 20 MG tablet Take 20 mg by mouth daily.   saxagliptin HCl (ONGLYZA) 5 MG TABS tablet Take 5 mg by mouth daily.   tamsulosin (FLOMAX) 0.4 MG CAPS capsule Take 0.4 mg by mouth daily.      No Known Allergies  Social History   Socioeconomic History   Marital status: Legally Separated    Spouse name: Not on file   Number of children: 3   Years of education: 7   Highest education level: Not on file  Occupational History   Not on file  Tobacco Use   Smoking status: Former    Packs/day: 0.25    Types: Cigarettes    Quit date: 11/19/2018    Years since quitting: 2.2   Smokeless tobacco: Never   Tobacco comments:    smoke 3 to 4 cigarettes a day, 09/08/16 quit 3 wks ago  Substance and Sexual Activity   Alcohol use: No    Alcohol/week: 0.0 standard drinks    Comment: quit 25 years  ago though before "drank so much that you couldn't tell b/t alcohol and wine"   Drug use: No   Sexual activity: Never  Other Topics Concern   Not on file  Social History Narrative   Lives alone   Social Determinants of Health   Financial Resource Strain: Not on file  Food Insecurity: Not on file  Transportation Needs: Not on file  Physical Activity: Not on file  Stress: Not on file  Social Connections: Not on file  Intimate Partner Violence: Not on file     Review of Systems: General: negative for chills, fever, night sweats or weight changes.  Cardiovascular: negative for chest pain, dyspnea on exertion, edema, orthopnea, palpitations, paroxysmal nocturnal dyspnea or shortness of  breath Dermatological: negative for rash Respiratory: negative for cough or wheezing Urologic: negative for hematuria Abdominal: negative for nausea, vomiting, diarrhea, bright red blood per rectum, melena, or hematemesis Neurologic: negative for visual changes, syncope, or dizziness All other systems reviewed and are otherwise negative except as noted above.    Blood pressure 110/62, pulse (!) 46, height 5\' 7"  (1.702 m), weight 190 lb 9.6 oz (86.5 kg), SpO2 97 %.  General appearance: alert and no distress Neck: no adenopathy, no carotid bruit, no JVD, supple, symmetrical, trachea midline, and thyroid not enlarged, symmetric, no tenderness/mass/nodules Lungs: clear to auscultation bilaterally Heart: regular rate and rhythm, S1, S2 normal, no murmur, click, rub or gallop Extremities: extremities normal, atraumatic, no cyanosis or edema Pulses: 2+ and symmetric Skin: Skin color, texture, turgor normal. No rashes or lesions Neurologic: Grossly normal  EKG sinus bradycardia 46 with inferior T wave inversion as well as left lateral T wave inversion.  This is minimally changed compared to his EKG a year ago.  I personally reviewed this EKG.  ASSESSMENT AND PLAN:   Chest pain History of chest pain in the past with a negative Myoview performed 03/19/2013.  Over the last year he has had 1 episode of chest pain approxi-3 weeks ago that improved with antacids.  Essential hypertension History of essential hypertension a blood pressure measured today 110/62.  He is on amlodipine and metoprolol.  Hyperlipidemia History of hyperlipidemia on statin therapy followed by his PCP.     Lorretta Harp MD FACP,FACC,FAHA, [facility] 02/20/2021 9:50 AM

## 2021-02-20 NOTE — Assessment & Plan Note (Signed)
History of chest pain in the past with a negative Myoview performed 03/19/2013.  Over the last year he has had 1 episode of chest pain approxi-3 weeks ago that improved with antacids.

## 2021-02-20 NOTE — Assessment & Plan Note (Signed)
History of hyperlipidemia on statin therapy followed by his PCP 

## 2021-02-20 NOTE — Patient Instructions (Signed)
Medication Instructions:   -Decrease metoprolol tartrate (lopressor) to 50mg  twice daily.  *If you need a refill on your cardiac medications before your next appointment, please call your pharmacy*    Follow-Up: At [facility], you and your health needs are our priority.  As part of our continuing mission to provide you with exceptional heart care, we have created designated Provider Care Teams.  These Care Teams include your primary Cardiologist (physician) and Advanced Practice Providers (APPs -  Physician Assistants and Nurse Practitioners) who all work together to provide you with the care you need, when you need it.  We recommend signing up for the patient portal called "MyChart".  Sign up information is provided on this After Visit Summary.  MyChart is used to connect with patients for Virtual Visits (Telemedicine).  Patients are able to view lab/test results, encounter notes, upcoming appointments, etc.  Non-urgent messages can be sent to your provider as well.   To learn more about what you can do with MyChart, go to [email or web address].    Your next appointment:   12 month(s)  The format for your next appointment:   In Person  Provider:   Quay Burow, MD

## 2021-02-20 NOTE — Assessment & Plan Note (Signed)
History of essential hypertension a blood pressure measured today 110/62.  He is on amlodipine and metoprolol.

## 2021-02-26 ENCOUNTER — Telehealth: Payer: Self-pay

## 2021-02-26 MED ORDER — METOPROLOL TARTRATE 25 MG PO TABS: 25.0000 mg | ORAL_TABLET | Freq: Two times a day (BID) | ORAL | 1 refills | Status: DC

## 2021-02-26 NOTE — Telephone Encounter (Signed)
Per Dr. Kennon Holter recommendations pt is to take metoprolol tartrate 25mg  BID. Pt states that he prefers communication via letter. Letter typed up a sent to pt. Prescription updated.

## 2021-02-26 NOTE — Telephone Encounter (Signed)
Pt walked in to the office today with his AVS stating that he was unclear of his instructions. Per AVS pt was instructed to decrease his lopressor from 75mg  twice daily, down to 50mg  twice daily. Pt states that he has always been taking 50mg  twice daily. Explained to pt the reason for medication change was because his heart rate at his office visit was 46bpm. Will send message to Dr. Gwenlyn Found to advise. Pt states that he would prefer a letter in the mail to instruct any medication changes. Pt verbalizes understanding.

## 2021-04-09 ENCOUNTER — Encounter (INDEPENDENT_AMBULATORY_CARE_PROVIDER_SITE_OTHER): Payer: Medicare Other | Admitting: Ophthalmology

## 2021-04-10 ENCOUNTER — Ambulatory Visit (INDEPENDENT_AMBULATORY_CARE_PROVIDER_SITE_OTHER): Payer: Medicare Other | Admitting: Ophthalmology

## 2021-04-10 ENCOUNTER — Encounter (INDEPENDENT_AMBULATORY_CARE_PROVIDER_SITE_OTHER): Payer: Self-pay | Admitting: Ophthalmology

## 2021-04-10 ENCOUNTER — Other Ambulatory Visit: Payer: Self-pay

## 2021-04-10 DIAGNOSIS — H35722 Serous detachment of retinal pigment epithelium, left eye: Secondary | ICD-10-CM | POA: Diagnosis not present

## 2021-04-10 DIAGNOSIS — H2513 Age-related nuclear cataract, bilateral: Secondary | ICD-10-CM

## 2021-04-10 DIAGNOSIS — H353221 Exudative age-related macular degeneration, left eye, with active choroidal neovascularization: Secondary | ICD-10-CM

## 2021-04-10 MED ORDER — BEVACIZUMAB 2.5 MG/0.1ML IZ SOSY
2.5000 mg | PREFILLED_SYRINGE | INTRAVITREAL | Status: AC | PRN
  Administered 2021-04-10: 2.5 mg via INTRAVITREAL

## 2021-04-10 NOTE — Assessment & Plan Note (Signed)
Cataract progression evident OU however the left eye slightly more.  While the macular findings left eye will limit the acuity somewhat, cataract extraction intraocular lens placement left eye would maximize his visual potential.  Patient to follow-up with Dr. Marylynn Pearson for consideration of cataract extraction intraocular lens placement

## 2021-04-10 NOTE — Assessment & Plan Note (Signed)
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

## 2021-04-10 NOTE — Assessment & Plan Note (Signed)
Recurrent subretinal fluid at 8-week follow-up today.  We will repeat injection today and shorten interval follow-up again to 5 to 6 weeks to maintain resolution of subretinal fluid as before.

## 2021-04-10 NOTE — Progress Notes (Signed)
04/10/2021     CHIEF COMPLAINT Patient presents for  Chief Complaint  Patient presents with   Retina Follow Up      HISTORY OF PRESENT ILLNESS: Peter Mann is a 81 y.o. male who presents to the clinic today for:   HPI     Retina Follow Up           Diagnosis: Wet AMD   Laterality: left eye   Onset: 8 weeks ago   Severity: mild   Duration: 8 weeks   Course: stable         Comments   8 weeks dilate OS, Avastin OS OCT. Pt states "Dr. Zadie Rhine was supposed to talk to Dr. Venetia Maxon about my eye, I am not getting the injection today, I am not signing the paper consent until I talk to Dr. Zadie Rhine." Patient states vision is stable and unchanged since last visit. Denies any new floaters or FOL. Pt states he has not had cataract surgery yet.      Last edited by Laurin Coder on 04/10/2021  9:49 AM.      Referring physician: Nolene Ebbs, MD [place],  [place] [address]  HISTORICAL INFORMATION:   Selected notes from the MEDICAL RECORD NUMBER    Lab Results  Component Value Date   HGBA1C 8.5 (H) 08/23/2015     CURRENT MEDICATIONS: Current Outpatient Medications (Ophthalmic Drugs)  Medication Sig   cycloSPORINE (RESTASIS) 0.05 % ophthalmic emulsion Place 1 drop into the left eye daily.    No current facility-administered medications for this visit. (Ophthalmic Drugs)   Current Outpatient Medications (Other)  Medication Sig   acetaminophen (TYLENOL) 500 MG tablet Take 1,000 mg by mouth every 6 (six) hours as needed for mild pain or headache.   amLODipine (NORVASC) 10 MG tablet Take 10 mg by mouth daily.   aspirin 325 MG tablet Take 1 tablet (325 mg total) by mouth daily.   chlorhexidine (PERIDEX) 0.12 % solution SMARTSIG:15 Milliliter(s) By Mouth Every Morning-Evening   dexlansoprazole (DEXILANT) 60 MG capsule Take 60 mg by mouth daily.   diclofenac sodium (VOLTAREN) 1 % GEL Apply 2 g topically as needed (pain).    Lancets (ONETOUCH DELICA PLUS  [id number]) MISC    lubiprostone (AMITIZA) 24 MCG capsule Take 24 mcg by mouth 2 (two) times daily with a meal.   meclizine (ANTIVERT) 25 MG tablet Take 25 mg by mouth 2 (two) times daily.   metoprolol tartrate (LOPRESSOR) 25 MG tablet Take 1 tablet (25 mg total) by mouth 2 (two) times daily.   ONE TOUCH ULTRA TEST test strip    polyethylene glycol (MIRALAX / GLYCOLAX) 17 g packet Take 17 g by mouth daily.   rosuvastatin (CRESTOR) 20 MG tablet Take 20 mg by mouth daily.   saxagliptin HCl (ONGLYZA) 5 MG TABS tablet Take 5 mg by mouth daily.   tamsulosin (FLOMAX) 0.4 MG CAPS capsule Take 0.4 mg by mouth daily.    No current facility-administered medications for this visit. (Other)      REVIEW OF SYSTEMS: ROS   Negative for: Constitutional, Gastrointestinal, Neurological, Skin, Genitourinary, Musculoskeletal, HENT, Endocrine, Cardiovascular, Eyes, Respiratory, Psychiatric, Allergic/Imm, Heme/Lymph Last edited by Hurman Horn, MD on 04/10/2021 10:36 AM.       ALLERGIES No Known Allergies  PAST MEDICAL HISTORY Past Medical History:  Diagnosis Date   Cancer ([facility])    prostrate ca, radiation treatments   Chest pain    Diabetes mellitus without complication ([place])  Dizziness    History of tobacco abuse    Hyperlipidemia    Hypertension    Stroke [facility])    Subconjunctival hemorrhage of left eye 07/18/2019   The condition of the involved eye is that of a subconjunctival hemorrhage.  These are often spontaneous but are also often at or near the site of low location of an injection should to be placed into the eye.  In the absence of direct blunt or severe trauma these will typically resolve on their own spontaneously and have no impact on the vision.    These are very similar to having a bruise in your   Past Surgical History:  Procedure Laterality Date   CHOLECYSTECTOMY N/A 01/30/2014   Procedure: LAPAROSCOPIC CHOLECYSTECTOMY;  Surgeon: Ralene Ok, MD;  Location: [facility] OR;  Service:  General;  Laterality: N/A;    FAMILY HISTORY Family History  Problem Relation Age of Onset   Cancer Brother    Cancer Sister     SOCIAL HISTORY Social History   Tobacco Use   Smoking status: Former    Packs/day: 0.25    Types: Cigarettes    Quit date: 11/19/2018    Years since quitting: 2.3   Smokeless tobacco: Never   Tobacco comments:    smoke 3 to 4 cigarettes a day, 09/08/16 quit 3 wks ago  Substance Use Topics   Alcohol use: No    Alcohol/week: 0.0 standard drinks    Comment: quit 25 years ago though before "drank so much that you couldn't tell b/t alcohol and wine"   Drug use: No         OPHTHALMIC EXAM:  Base Eye Exam     Visual Acuity (ETDRS)       Right Left   Dist [place] 20/30 -2 20/400   Dist ph [place]  NI         Tonometry (Tonopen, 9:53 AM)       Right Left   Pressure 11 6         Pachymetry (11/30/2019)       Right Left   Thickness 21 18         Pupils       Pupils Dark Light APD   Right PERRL 3 2 None   Left PERRL 3 2 None         Extraocular Movement       Right Left    Full Full         Neuro/Psych     Oriented x3: Yes   Mood/Affect: Normal         Dilation     Left eye: 1.0% Mydriacyl, 2.5% Phenylephrine @ 9:53 AM           Slit Lamp and Fundus Exam     External Exam       Right Left   External Normal Normal         Slit Lamp Exam       Right Left   Lids/Lashes Normal Normal   Conjunctiva/Sclera White and quiet White and quiet   Cornea Clear Clear   Anterior Chamber Deep and quiet Deep and quiet   Iris Round and reactive Round and reactive   Lens 2+ Nuclear sclerosis 3+ Nuclear sclerosis   Anterior Vitreous Normal small floaters, oil from prior needle injections         Fundus Exam       Right Left   Posterior Vitreous  Posterior vitreous detachment  Disc  Normal   C/D Ratio  0.5   Macula  Age related macular degeneration, less macular thickening, Hard drusen, Retinal pigment epithelial  detachment, no hemorrhage   Vessels  no DR   Periphery  Normal            IMAGING AND PROCEDURES  Imaging and Procedures for 04/10/21  Intravitreal Injection, Pharmacologic Agent - OS - Left Eye       Time Out 04/10/2021. 10:36 AM. Confirmed correct patient, procedure, site, and patient consented.   Anesthesia Topical anesthesia was used. Anesthetic medications included Lidocaine 4%.   Procedure Preparation included Tobramycin 0.3%, 10% betadine to eyelids, 5% betadine to ocular surface. A 30 gauge needle was used.   Injection: 2.5 mg bevacizumab 2.5 MG/0.1ML   Route: Intravitreal, Site: Left Eye   NDC: [phone], Lot: [id number]   Post-op Post injection exam found visual acuity of at least counting fingers. The patient tolerated the procedure well. There were no complications. The patient received written and verbal post procedure care education. Post injection medications included ocuflox.      OCT, Retina - OU - Both Eyes       Right Eye Quality was good. Scan locations included subfoveal. Central Foveal Thickness: 257. Progression has been stable. Findings include retinal drusen , abnormal foveal contour, no SRF, no IRF.   Left Eye Quality was good. Scan locations included subfoveal. Central Foveal Thickness: 299. Progression has worsened. Findings include subretinal fluid, pigment epithelial detachment, choroidal neovascular membrane, intraretinal fluid, abnormal foveal contour.   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  We will repeat injection today and shorten interval follow-up again to 5 to 6 weeks to maintain resolution of subretinal fluid as before.  Cataract, nuclear sclerotic, both eyes Cataract progression evident OU however the left eye slightly more.  While the macular findings left eye will limit the acuity somewhat, cataract extraction intraocular lens placement left eye would maximize his visual potential.  Patient to follow-up with Dr. Marylynn Pearson for consideration of cataract extraction intraocular lens placement     ICD-10-CM   1. Exudative age-related macular degeneration of left eye with active choroidal neovascularization (HCC)  H35.3221 Intravitreal Injection, Pharmacologic Agent - OS - Left Eye    OCT, Retina - OU - Both Eyes    bevacizumab (AVASTIN) SOSY 2.5 mg    2. Serous detachment of retinal pigment epithelium of left eye  H35.722     3. Cataract, nuclear sclerotic, both eyes  H25.13       1.  OS, recurrence of subretinal fluid at longer interval follow-up in 8 weeks.  Repeat injection today and shorten interval again to 6 weeks to keep the subretinal fluid hopefully from returning.  2.  Okay to proceed with cath extraction intraocular displacement left eye at any time to maximize visual potential as well as allow for enhanced visualization of the posterior pole and the ongoing therapy of wet AMD  3.  Follow-up with Dr.  Marylynn Pearson as scheduled  Ophthalmic Meds Ordered this visit:  Meds ordered this encounter  Medications   bevacizumab (AVASTIN) SOSY 2.5 mg       Return in about 6 weeks (around 05/22/2021) for dilate, OS, AVASTIN OCT.  There are no Patient Instructions on file for this visit.   Explained the diagnoses, plan, and follow up with the patient and they expressed understanding.  Patient expressed understanding of the importance of proper follow up care.   Clent Demark Malicia Blasdel  M.D. Diseases & Surgery of the Retina and Vitreous [facility]ina [facility] [place] 04/10/21     Abbreviations: M myopia (nearsighted); A astigmatism; H hyperopia (farsighted); P presbyopia; Mrx spectacle prescription;  CTL contact lenses; OD right eye; OS left eye; OU both eyes  XT exotropia; ET esotropia; PEK punctate epithelial keratitis; PEE punctate epithelial erosions; DES dry eye syndrome; MGD meibomian gland dysfunction; ATs artificial tears; PFAT's preservative free artificial tears; [place] nuclear sclerotic cataract; PSC posterior subcapsular cataract; ERM epi-retinal membrane; PVD posterior vitreous detachment; RD retinal detachment; DM diabetes mellitus; DR diabetic retinopathy; NPDR non-proliferative diabetic retinopathy; PDR proliferative diabetic retinopathy; CSME clinically significant macular edema; DME diabetic macular edema; dbh dot blot hemorrhages; CWS cotton wool spot; POAG primary open angle glaucoma; C/D cup-to-disc ratio; HVF humphrey visual field; GVF goldmann visual field; OCT optical coherence tomography; IOP intraocular pressure; BRVO Branch retinal vein occlusion; CRVO central retinal vein occlusion; CRAO central retinal artery occlusion; BRAO branch retinal artery occlusion; RT retinal tear; SB scleral buckle; PPV pars plana vitrectomy; VH Vitreous hemorrhage; PRP panretinal laser photocoagulation; IVK intravitreal kenalog; VMT vitreomacular traction; MH Macular hole;  NVD neovascularization of the disc; NVE neovascularization elsewhere; AREDS age related eye disease study; ARMD age related macular degeneration; POAG primary open angle glaucoma; EBMD epithelial/anterior basement membrane dystrophy; ACIOL anterior chamber intraocular lens; IOL intraocular lens; PCIOL posterior chamber intraocular lens; Phaco/IOL phacoemulsification with intraocular lens placement; [place] photorefractive keratectomy; LASIK laser assisted in situ keratomileusis; HTN hypertension; DM diabetes mellitus; COPD  chronic obstructive pulmonary disease

## 2021-05-22 ENCOUNTER — Encounter (INDEPENDENT_AMBULATORY_CARE_PROVIDER_SITE_OTHER): Payer: Medicare Other | Admitting: Ophthalmology

## 2021-05-22 ENCOUNTER — Ambulatory Visit (INDEPENDENT_AMBULATORY_CARE_PROVIDER_SITE_OTHER): Payer: Medicare Other | Admitting: Ophthalmology

## 2021-05-22 ENCOUNTER — Other Ambulatory Visit: Payer: Self-pay

## 2021-05-22 ENCOUNTER — Encounter (INDEPENDENT_AMBULATORY_CARE_PROVIDER_SITE_OTHER): Payer: Self-pay | Admitting: Ophthalmology

## 2021-05-22 DIAGNOSIS — H353221 Exudative age-related macular degeneration, left eye, with active choroidal neovascularization: Secondary | ICD-10-CM

## 2021-05-22 DIAGNOSIS — H2511 Age-related nuclear cataract, right eye: Secondary | ICD-10-CM

## 2021-05-22 DIAGNOSIS — Z961 Presence of intraocular lens: Secondary | ICD-10-CM | POA: Diagnosis not present

## 2021-05-22 MED ORDER — BEVACIZUMAB 2.5 MG/0.1ML IZ SOSY
2.5000 mg | PREFILLED_SYRINGE | INTRAVITREAL | Status: AC | PRN
  Administered 2021-05-22: 2.5 mg via INTRAVITREAL

## 2021-05-22 NOTE — Assessment & Plan Note (Signed)
Persistent subretinal fluid likely from RPE rip from previous vascularized PED, CNVM.  Currently at 6-week follow-up.  Anatomy preserved and stable.  Acuity stable and visualization of the posterior pole enhance significantly post recent cataract surgery ?

## 2021-05-22 NOTE — Progress Notes (Signed)
? ? ?05/22/2021 ? ?  ? ?CHIEF COMPLAINT ?Patient presents for  ?Chief Complaint  ?Patient presents with  ? Macular Degeneration  ? ? ?With history of wet AMD OS, small RPE rip and persistence of subretinal fluid and yet preserved acuity.  Today at 6-week follow-up ? ?Patient reports incidental, 1 week post cataract surgery with intraocular lens placement left eye with Dr. Marylynn Pearson ? ?HISTORY OF PRESENT ILLNESS: ?Peter Mann is a 81 y.o. male who presents to the clinic today for:  ? ?HPI   ?6 weeks dilate OS, Avastin OS, OCT. ?Pt states "I had cataract surgery done by Marylynn Pearson on Monday, left eye. A little bit of improvement." ?Pt is not clear on which drops he is and isn't using. Patient states some of his drops are empty and he is not using them. ?Last edited by Laurin Coder on 05/22/2021  9:43 AM.  ?  ? ? ?Referring physician: ?Marylynn Pearson, MD ?[address] ?[place],  [place] [address] ? ?HISTORICAL INFORMATION:  ? ?Selected notes from the [facility] ?  ? ?Lab Results  ?Component Value Date  ? HGBA1C 8.5 (H) 08/23/2015  ?  ? ?CURRENT MEDICATIONS: ?Current Outpatient Medications (Ophthalmic Drugs)  ?Medication Sig  ? cycloSPORINE (RESTASIS) 0.05 % ophthalmic emulsion Place 1 drop into the left eye daily.   ? ?No current facility-administered medications for this visit. (Ophthalmic Drugs)  ? ?Current Outpatient Medications (Other)  ?Medication Sig  ? acetaminophen (TYLENOL) 500 MG tablet Take 1,000 mg by mouth every 6 (six) hours as needed for mild pain or headache.  ? amLODipine (NORVASC) 10 MG tablet Take 10 mg by mouth daily.  ? aspirin 325 MG tablet Take 1 tablet (325 mg total) by mouth daily.  ? chlorhexidine (PERIDEX) 0.12 % solution [id number] Milliliter(s) By Mouth Every Morning-Evening  ? dexlansoprazole (DEXILANT) 60 MG capsule Take 60 mg by mouth daily.  ? diclofenac sodium (VOLTAREN) 1 % GEL Apply 2 g topically as needed (pain).   ? Lancets (ONETOUCH DELICA PLUS [id number])  MISC   ? lubiprostone (AMITIZA) 24 MCG capsule Take 24 mcg by mouth 2 (two) times daily with a meal.  ? meclizine (ANTIVERT) 25 MG tablet Take 25 mg by mouth 2 (two) times daily.  ? metoprolol tartrate (LOPRESSOR) 25 MG tablet Take 1 tablet (25 mg total) by mouth 2 (two) times daily.  ? ONE TOUCH ULTRA TEST test strip   ? polyethylene glycol (MIRALAX / GLYCOLAX) 17 g packet Take 17 g by mouth daily.  ? rosuvastatin (CRESTOR) 20 MG tablet Take 20 mg by mouth daily.  ? saxagliptin HCl (ONGLYZA) 5 MG TABS tablet Take 5 mg by mouth daily.  ? tamsulosin (FLOMAX) 0.4 MG CAPS capsule Take 0.4 mg by mouth daily.   ? ?No current facility-administered medications for this visit. (Other)  ? ? ? ? ?REVIEW OF SYSTEMS: ?ROS   ?Negative for: Constitutional, Gastrointestinal, Neurological, Skin, Genitourinary, Musculoskeletal, HENT, Endocrine, Cardiovascular, Eyes, Respiratory, Psychiatric, Allergic/Imm, Heme/Lymph ?Last edited by Hurman Horn, MD on 05/22/2021 10:33 AM.  ?  ? ? ? ?ALLERGIES ?No Known Allergies ? ?PAST MEDICAL HISTORY ?Past Medical History:  ?Diagnosis Date  ? Cancer [facility])   ? prostrate ca, radiation treatments  ? Chest pain   ? Diabetes mellitus without complication ([place])   ? Dizziness   ? History of tobacco abuse   ? Hyperlipidemia   ? Hypertension   ? Stroke [facility])   ? Subconjunctival hemorrhage of  left eye 07/18/2019  ? The condition of the involved eye is that of a subconjunctival hemorrhage.  These are often spontaneous but are also often at or near the site of low location of an injection should to be placed into the eye.  In the absence of direct blunt or severe trauma these will typically resolve on their own spontaneously and have no impact on the vision.    These are very similar to having a bruise in your  ? ?Past Surgical History:  ?Procedure Laterality Date  ? CHOLECYSTECTOMY N/A 01/30/2014  ? Procedure: LAPAROSCOPIC CHOLECYSTECTOMY;  Surgeon: Ralene Ok, MD;  Location: [place];  Service: General;   Laterality: N/A;  ? ? ?FAMILY HISTORY ?Family History  ?Problem Relation Age of Onset  ? Cancer Brother   ? Cancer Sister   ? ? ?SOCIAL HISTORY ?Social History  ? ?Tobacco Use  ? Smoking status: Former  ?  Packs/day: 0.25  ?  Types: Cigarettes  ?  Quit date: 11/19/2018  ?  Years since quitting: 2.5  ? Smokeless tobacco: Never  ? Tobacco comments:  ?  smoke 3 to 4 cigarettes a day, 09/08/16 quit 3 wks ago  ?Substance Use Topics  ? Alcohol use: No  ?  Alcohol/week: 0.0 standard drinks  ?  Comment: quit 25 years ago though before "drank so much that you couldn't tell b/t alcohol and wine"  ? Drug use: No  ? ?  ? ?  ? ?OPHTHALMIC EXAM: ? ?Base Eye Exam   ? ? Visual Acuity (ETDRS)   ? ?   Right Left  ? Dist [place] 20/25 -2 20/400  ? Dist ph   NI  ? ?  ?  ? ? Tonometry (Tonopen, 9:46 AM)   ? ?   Right Left  ? Pressure 28 21  ? ?  ?  ? ? Pachymetry (11/30/2019)   ? ?   Right Left  ? Thickness 21 18  ? ?  ?  ? ? Pupils   ? ?   Dark Light Shape React APD  ? Right 2 2 Round Minimal None  ? Left   Irregular fixed None  ? ?  ?  ? ? Extraocular Movement   ? ?   Right Left  ?  Full Full  ? ?  ?  ? ? Neuro/Psych   ? ? Oriented x3: Yes  ? Mood/Affect: Normal  ? ?  ?  ? ? Dilation   ? ? Left eye: 1.0% Mydriacyl, 2.5% Phenylephrine @ 9:46 AM  ? ?  ?  ? ?  ? ?Slit Lamp and Fundus Exam   ? ? External Exam   ? ?   Right Left  ? External Normal Normal  ? ?  ?  ? ? Slit Lamp Exam   ? ?   Right Left  ? Lids/Lashes Normal Normal  ? Conjunctiva/Sclera White and quiet White and quiet  ? Cornea Clear Clear  ? Anterior Chamber Deep and quiet Deep and quiet  ? Iris Round and reactive Round and reactive  ? Lens 2+ Nuclear sclerosis Centered posterior chamber intraocular lens, small retained cortex superiorly 1130, and 7:00 meridian peripherally, not a visual axis  ? Anterior Vitreous Normal small floaters, oil from prior needle injections  ? ?  ?  ? ? Fundus Exam   ? ?   Right Left  ? Posterior Vitreous  Posterior vitreous detachment  ? Disc  Normal  ?  C/D Ratio  0.5  ? Macula  Age related macular degeneration, less macular thickening, Hard drusen, Retinal pigment epithelial detachment, no hemorrhage  ? Vessels  no DR  ? Periphery  Normal  ? ?  ?  ? ?  ? ? ?IMAGING AND PROCEDURES  ?Imaging and Procedures for 05/22/21 ? ?OCT, Retina - OU - Both Eyes   ? ?   ?Right Eye ?Quality was good. Scan locations included subfoveal. Central Foveal Thickness: 257. Progression has been stable. Findings include retinal drusen , abnormal foveal contour, no SRF, no IRF.  ? ?Left Eye ?Quality was good. Scan locations included subfoveal. Central Foveal Thickness: 271. Progression has worsened. Findings include subretinal fluid, pigment epithelial detachment, choroidal neovascular membrane, intraretinal fluid, abnormal foveal contour.  ? 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  But now at 6 weeks persistence of subretinal fluid OS ? ? ?Incidental media opacity OU ? ? ? ?  ? ?Intravitreal Injection, Pharmacologic Agent - OS - Left Eye   ? ?   ?Time Out ?05/22/2021. 10:37 AM. Confirmed correct patient, procedure, site, and patient consented.  ? ?Anesthesia ?Topical anesthesia was used. Anesthetic medications included Lidocaine 4%.  ? ?Procedure ?Preparation included Tobramycin 0.3%, 10% betadine to eyelids, 5% betadine to ocular surface. A 30 gauge needle was used.  ? ?Injection: ?2.5 mg bevacizumab 2.5 MG/0.1ML ?  Route: Intravitreal, Site: Left Eye ?  [place]: [id number], Lot: [id number]  ? ?Post-op ?Post injection exam found visual acuity of at least counting fingers. The patient tolerated the procedure well. There were no complications. The patient received written and verbal post procedure care education. Post injection medications included ocuflox.  ? ?  ? ? ?  ?  ? ?  ?ASSESSMENT/PLAN: ? ?Nuclear sclerotic cataract of right eye ?Moderate, follow-up as per Dr. Marylynn Pearson ? ?Pseudophakia of left eye ?OS, posterior chamber lens well centered, patient reports improved acuity. ? 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

## 2021-05-22 NOTE — Assessment & Plan Note (Signed)
Moderate, follow-up as per Dr. Marylynn Pearson ?

## 2021-05-22 NOTE — Assessment & Plan Note (Signed)
OS, posterior chamber lens well centered, patient reports improved acuity. ?

## 2021-07-03 ENCOUNTER — Encounter (INDEPENDENT_AMBULATORY_CARE_PROVIDER_SITE_OTHER): Payer: Self-pay | Admitting: Ophthalmology

## 2021-07-03 ENCOUNTER — Ambulatory Visit (INDEPENDENT_AMBULATORY_CARE_PROVIDER_SITE_OTHER): Payer: Medicare Other | Admitting: Ophthalmology

## 2021-07-03 ENCOUNTER — Encounter (INDEPENDENT_AMBULATORY_CARE_PROVIDER_SITE_OTHER): Payer: Medicare Other | Admitting: Ophthalmology

## 2021-07-03 DIAGNOSIS — H353221 Exudative age-related macular degeneration, left eye, with active choroidal neovascularization: Secondary | ICD-10-CM | POA: Diagnosis not present

## 2021-07-03 DIAGNOSIS — E119 Type 2 diabetes mellitus without complications: Secondary | ICD-10-CM | POA: Diagnosis not present

## 2021-07-03 DIAGNOSIS — H35722 Serous detachment of retinal pigment epithelium, left eye: Secondary | ICD-10-CM | POA: Diagnosis not present

## 2021-07-03 MED ORDER — BEVACIZUMAB 2.5 MG/0.1ML IZ SOSY
2.5000 mg | PREFILLED_SYRINGE | INTRAVITREAL | Status: AC | PRN
  Administered 2021-07-03: 2.5 mg via INTRAVITREAL

## 2021-07-03 NOTE — Assessment & Plan Note (Signed)
No detectable diabetic retinopathy today 

## 2021-07-03 NOTE — Assessment & Plan Note (Signed)
Chronic active CNVM, combined with RPE rip, with serous retinal detachment the fovea which worsens off therapy.  Preserved acuity.  Repeat injection today at 6-week interval and maintain evaluation in 6 weeks ?

## 2021-07-03 NOTE — Progress Notes (Signed)
? ? ?07/03/2021 ? ?  ? ?CHIEF COMPLAINT ?Patient presents for  ?Chief Complaint  ?Patient presents with  ? Macular Degeneration  ? ? ? ? ?HISTORY OF PRESENT ILLNESS: ?Peter Mann is a 81 y.o. male who presents to the clinic today for:  ? ?HPI   ?6 weeks for DILATE OS, AVASTIN OCT. ?Pt stated no changes in vision. ?Pt reports having pain in the left eye but then goes away. Pt also reports being teary eyed in OU but mostly in the left. ?Pt denies floaters and FOL. ? ?Last edited by Silvestre Moment on 07/03/2021  9:09 AM.  ?  ? ? ?Referring physician: ?Nolene Ebbs, MD ?[address] ?[place],  [place] [id number] ? ?HISTORICAL INFORMATION:  ? ?Selected notes from the [facility] ?  ? ?Lab Results  ?Component Value Date  ? HGBA1C 8.5 (H) 08/23/2015  ?  ? ?CURRENT MEDICATIONS: ?Current Outpatient Medications (Ophthalmic Drugs)  ?Medication Sig  ? cycloSPORINE (RESTASIS) 0.05 % ophthalmic emulsion Place 1 drop into the left eye daily.   ? ?No current facility-administered medications for this visit. (Ophthalmic Drugs)  ? ?Current Outpatient Medications (Other)  ?Medication Sig  ? acetaminophen (TYLENOL) 500 MG tablet Take 1,000 mg by mouth every 6 (six) hours as needed for mild pain or headache.  ? amLODipine (NORVASC) 10 MG tablet Take 10 mg by mouth daily.  ? aspirin 325 MG tablet Take 1 tablet (325 mg total) by mouth daily.  ? chlorhexidine (PERIDEX) 0.12 % solution SMARTSIG:[id number] Milliliter(s) By Mouth Every Morning-Evening  ? dexlansoprazole (DEXILANT) 60 MG capsule Take 60 mg by mouth daily.  ? diclofenac sodium (VOLTAREN) 1 % GEL Apply 2 g topically as needed (pain).   ? Lancets (ONETOUCH DELICA PLUS [id number]) MISC   ? lubiprostone (AMITIZA) 24 MCG capsule Take 24 mcg by mouth 2 (two) times daily with a meal.  ? meclizine (ANTIVERT) 25 MG tablet Take 25 mg by mouth 2 (two) times daily.  ? metoprolol tartrate (LOPRESSOR) 25 MG tablet Take 1 tablet (25 mg total) by mouth 2 (two) times daily.  ? ONE TOUCH ULTRA TEST test  strip   ? polyethylene glycol (MIRALAX / GLYCOLAX) 17 g packet Take 17 g by mouth daily.  ? rosuvastatin (CRESTOR) 20 MG tablet Take 20 mg by mouth daily.  ? saxagliptin HCl (ONGLYZA) 5 MG TABS tablet Take 5 mg by mouth daily.  ? tamsulosin (FLOMAX) 0.4 MG CAPS capsule Take 0.4 mg by mouth daily.   ? ?No current facility-administered medications for this visit. (Other)  ? ? ? ? ?REVIEW OF SYSTEMS: ?ROS   ?Negative for: Constitutional, Gastrointestinal, Neurological, Skin, Genitourinary, Musculoskeletal, HENT, Endocrine, Cardiovascular, Eyes, Respiratory, Psychiatric, Allergic/Imm, Heme/Lymph ?Last edited by Silvestre Moment on 07/03/2021  9:09 AM.  ?  ? ? ? ?ALLERGIES ?No Known Allergies ? ?PAST MEDICAL HISTORY ?Past Medical History:  ?Diagnosis Date  ? Cancer [facility])   ? prostrate ca, radiation treatments  ? Chest pain   ? Diabetes mellitus without complication ([place])   ? Dizziness   ? History of tobacco abuse   ? Hyperlipidemia   ? Hypertension   ? Stroke [facility])   ? Subconjunctival hemorrhage of left eye 07/18/2019  ? The condition of the involved eye is that of a subconjunctival hemorrhage.  These are often spontaneous but are also often at or near the site of low location of an injection should to be placed into the eye.  In the absence of direct blunt or severe trauma  these will typically resolve on their own spontaneously and have no impact on the vision.    These are very similar to having a bruise in your  ? ?Past Surgical History:  ?Procedure Laterality Date  ? CHOLECYSTECTOMY N/A 01/30/2014  ? Procedure: LAPAROSCOPIC CHOLECYSTECTOMY;  Surgeon: Ralene Ok, MD;  Location: [place];  Service: General;  Laterality: N/A;  ? ? ?FAMILY HISTORY ?Family History  ?Problem Relation Age of Onset  ? Cancer Brother   ? Cancer Sister   ? ? ?SOCIAL HISTORY ?Social History  ? ?Tobacco Use  ? Smoking status: Former  ?  Packs/day: 0.25  ?  Types: Cigarettes  ?  Quit date: 11/19/2018  ?  Years since quitting: 2.6  ? Smokeless tobacco:  Never  ? Tobacco comments:  ?  smoke 3 to 4 cigarettes a day, 09/08/16 quit 3 wks ago  ?Substance Use Topics  ? Alcohol use: No  ?  Alcohol/week: 0.0 standard drinks  ?  Comment: quit 25 years ago though before "drank so much that you couldn't tell b/t alcohol and wine"  ? Drug use: No  ? ?  ? ?  ? ?OPHTHALMIC EXAM: ? ?Base Eye Exam   ? ? Visual Acuity (ETDRS)   ? ?   Right Left  ? Dist [place] 20/40 20/200 -1 +2  ? ?  ?  ? ? Tonometry (Tonopen, 9:14 AM)   ? ?   Right Left  ? Pressure 15 16  ? ?  ?  ? ? Pachymetry (11/30/2019)   ? ?   Right Left  ? Thickness 21 18  ? ?  ?  ? ? Pupils   ? ?   Pupils APD  ? Right PERRL None  ? Left PERRL None  ? ?  ?  ? ? Visual Fields   ? ?   Left Right  ?  Full Full  ? ?  ?  ? ? Extraocular Movement   ? ?   Right Left  ?  Full Full  ? ?  ?  ? ? Neuro/Psych   ? ? Oriented x3: Yes  ? Mood/Affect: Normal  ? ?  ?  ? ? Dilation   ? ? Left eye: 2.5% Phenylephrine, 1.0% Mydriacyl @ 9:14 AM  ? ?  ?  ? ?  ? ?Slit Lamp and Fundus Exam   ? ? External Exam   ? ?   Right Left  ? External Normal Normal  ? ?  ?  ? ? Slit Lamp Exam   ? ?   Right Left  ? Lids/Lashes Normal Normal  ? Conjunctiva/Sclera White and quiet White and quiet  ? Cornea Clear Clear  ? Anterior Chamber Deep and quiet Deep and quiet  ? Iris Round and reactive Round and reactive  ? Lens 2+ Nuclear sclerosis Centered posterior chamber intraocular lens, small retained cortex superiorly 1130, and 7:00 meridian peripherally, not a visual axis  ? Anterior Vitreous Normal small floaters, oil from prior needle injections  ? ?  ?  ? ? Fundus Exam   ? ?   Right Left  ? Posterior Vitreous  Posterior vitreous detachment, Central vitreous floaters  ? Disc  Normal  ? C/D Ratio  0.5  ? Macula  Age related macular degeneration, less macular thickening, Hard drusen, Retinal pigment epithelial detachment, no hemorrhage  ? Vessels  no DR  ? Periphery  Normal  ? ?  ?  ? ?  ? ? ?IMAGING  AND PROCEDURES  ?Imaging and Procedures for 07/03/21 ? ?OCT, Retina -  OU - Both Eyes   ? ?   ?Right Eye ?Quality was good. Scan locations included subfoveal. Central Foveal Thickness: 257. Progression has been stable. Findings include retinal drusen , abnormal foveal contour, no SRF, no IRF.  ? ?Left Eye ?Quality was good. Scan locations included subfoveal. Central Foveal Thickness: 284. Progression has worsened. Findings include subretinal fluid, pigment epithelial detachment, choroidal neovascular membrane, intraretinal fluid, abnormal foveal contour.  ? 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  But now at 6 weeks persistence of subretinal fluid OS, stable over time ? ? ?Incidental media opacity OU ? ? ? ?  ? ?Intravitreal Injection, Pharmacologic Agent - OS - Left Eye   ? ?   ?Time Out ?07/03/2021. 10:21 AM. Confirmed correct patient, procedure, site, and patient consented.  ? ?Anesthesia ?Topical anesthesia was used. Anesthetic medications included Lidocaine 4%.  ? ?Procedure ?Preparation included Tobramycin 0.3%, 10% betadine to eyelids, 5% betadine to ocular surface. A 30 gauge needle was used.  ? ?Injection: ?2.5 mg bevacizumab 2.5 MG/0.1ML ?  Route: Intravitreal, Site: Left Eye ?  [place]: [id number], Lot: [id number]  ? ?Post-op ?Post injection exam found visual acuity of at least counting fingers. The patient tolerated the procedure well. There were no complications. The patient received written and verbal post procedure care education. Post injection medications included ocuflox.  ? ?  ? ? ?  ?  ? ?  ?ASSESSMENT/PLAN: ? ?Exudative age-related macular degeneration of left eye with active choroidal neovascularization ([place]) ?Chronic active CNVM, combined with RPE rip, with serous retinal detachment the fovea which worsens off therapy.  Preserved acuity.  Repeat injection today at 6-week interval and maintain evaluation in 6 weeks ? 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 ?Diabetes mellitus type 2, noninsulin dependent ([place]) ?No detectable diabetic retinopathy today  ? ?  ICD-10-CM   ?1. Exudative age-related

## 2021-07-03 NOTE — Assessment & Plan Note (Signed)
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

## 2021-08-14 ENCOUNTER — Ambulatory Visit (INDEPENDENT_AMBULATORY_CARE_PROVIDER_SITE_OTHER): Payer: Medicare Other | Admitting: Ophthalmology

## 2021-08-14 ENCOUNTER — Encounter (INDEPENDENT_AMBULATORY_CARE_PROVIDER_SITE_OTHER): Payer: Self-pay | Admitting: Ophthalmology

## 2021-08-14 DIAGNOSIS — H353221 Exudative age-related macular degeneration, left eye, with active choroidal neovascularization: Secondary | ICD-10-CM | POA: Diagnosis not present

## 2021-08-14 DIAGNOSIS — H35722 Serous detachment of retinal pigment epithelium, left eye: Secondary | ICD-10-CM | POA: Diagnosis not present

## 2021-08-14 MED ORDER — BEVACIZUMAB 2.5 MG/0.1ML IZ SOSY
2.5000 mg | PREFILLED_SYRINGE | INTRAVITREAL | Status: AC | PRN
  Administered 2021-08-14: 2.5 mg via INTRAVITREAL

## 2021-08-14 NOTE — Assessment & Plan Note (Signed)
Component of wet AMD OS 

## 2021-08-14 NOTE — Progress Notes (Signed)
08/14/2021     CHIEF COMPLAINT Patient presents for  Chief Complaint  Patient presents with   Macular Degeneration      HISTORY OF PRESENT ILLNESS: Peter Mann is a 81 y.o. male who presents to the clinic today for:   HPI   History of macular degeneration left eye.  Chronically controlled periodic Avastin injection OS at 6-week follow-up. Last edited by Hurman Horn, MD on 08/14/2021  9:23 AM.      Referring physician: Nolene Ebbs, MD [place],   [id number]  HISTORICAL INFORMATION:   Selected notes from the MEDICAL RECORD NUMBER    Lab Results  Component Value Date   HGBA1C 8.5 (H) 08/23/2015     CURRENT MEDICATIONS: Current Outpatient Medications (Ophthalmic Drugs)  Medication Sig   cycloSPORINE (RESTASIS) 0.05 % ophthalmic emulsion Place 1 drop into the left eye daily.    No current facility-administered medications for this visit. (Ophthalmic Drugs)   Current Outpatient Medications (Other)  Medication Sig   acetaminophen (TYLENOL) 500 MG tablet Take 1,000 mg by mouth every 6 (six) hours as needed for mild pain or headache.   amLODipine (NORVASC) 10 MG tablet Take 10 mg by mouth daily.   aspirin 325 MG tablet Take 1 tablet (325 mg total) by mouth daily.   chlorhexidine (PERIDEX) 0.12 % solution SMARTSIG:15 Milliliter(s) By Mouth Every Morning-Evening   dexlansoprazole (DEXILANT) 60 MG capsule Take 60 mg by mouth daily.   diclofenac sodium (VOLTAREN) 1 % GEL Apply 2 g topically as needed (pain).    Lancets (ONETOUCH DELICA PLUS [id number]) MISC    lubiprostone (AMITIZA) 24 MCG capsule Take 24 mcg by mouth 2 (two) times daily with a meal.   meclizine (ANTIVERT) 25 MG tablet Take 25 mg by mouth 2 (two) times daily.   metoprolol tartrate (LOPRESSOR) 25 MG tablet Take 1 tablet (25 mg total) by mouth 2 (two) times daily.   ONE TOUCH ULTRA TEST test strip    polyethylene glycol (MIRALAX / GLYCOLAX) 17 g packet Take 17 g by mouth daily.    rosuvastatin (CRESTOR) 20 MG tablet Take 20 mg by mouth daily.   saxagliptin HCl (ONGLYZA) 5 MG TABS tablet Take 5 mg by mouth daily.   tamsulosin (FLOMAX) 0.4 MG CAPS capsule Take 0.4 mg by mouth daily.    No current facility-administered medications for this visit. (Other)      REVIEW OF SYSTEMS: ROS   Negative for: Constitutional, Gastrointestinal, Neurological, Skin, Genitourinary, Musculoskeletal, HENT, Endocrine, Cardiovascular, Eyes, Respiratory, Psychiatric, Allergic/Imm, Heme/Lymph Last edited by Hurman Horn, MD on 08/14/2021  9:28 AM.       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  These are often spontaneous but are also often at or near the site of low location of an injection should to be placed into the eye.  In the absence of direct blunt or severe trauma these will typically resolve on their own spontaneously and have no impact on the vision.    These are very similar to having  a bruise in your   Past Surgical History:  Procedure Laterality Date   CHOLECYSTECTOMY N/A 01/30/2014   Procedure: LAPAROSCOPIC CHOLECYSTECTOMY;  Surgeon: Ralene Ok, MD;  Location: [facility] OR;  Service: General;  Laterality: N/A;    FAMILY HISTORY Family History  Problem Relation Age of Onset   Cancer Brother    Cancer Sister     SOCIAL HISTORY Social History   Tobacco Use   Smoking status: Former    Packs/day: 0.25    Types: Cigarettes    Quit date: 11/19/2018    Years since quitting: 2.7   Smokeless tobacco: Never   Tobacco comments:    smoke 3 to 4 cigarettes a day, 09/08/16 quit 3 wks ago   Substance Use Topics   Alcohol use: No    Alcohol/week: 0.0 standard drinks    Comment: quit 25 years ago though before "drank so much that you couldn't tell b/t alcohol and wine"   Drug use: No         OPHTHALMIC EXAM:  Base Eye Exam     Visual Acuity (ETDRS)       Right Left   Dist [place] 20/30 -2 20/150         Tonometry (Tonopen, 9:27 AM)       Right Left   Pressure 24 21         Pachymetry (11/30/2019)       Right Left   Thickness 21 18         Pupils       Pupils   Right PERRL   Left PERRL         Visual Fields       Left Right    Full Full         Extraocular Movement       Right Left    Full, Ortho Full, Ortho         Neuro/Psych     Oriented x3: Yes   Mood/Affect: Normal         Dilation     Left eye: 1.0% Mydriacyl, 2.5% Phenylephrine @ 9:28 AM           Slit Lamp and Fundus Exam     External Exam       Right Left   External Normal Normal         Slit Lamp Exam       Right Left   Lids/Lashes Normal Normal   Conjunctiva/Sclera White and quiet White and quiet   Cornea Clear Clear   Anterior Chamber Deep and quiet Deep and quiet   Iris Round and reactive Round and reactive   Lens 2+ Nuclear sclerosis Centered posterior chamber intraocular lens, small retained cortex superiorly 1130, and 7:00 meridian peripherally, not a visual axis   Anterior Vitreous Normal small floaters, oil from prior needle injections         Fundus Exam       Right Left   Posterior Vitreous  Posterior vitreous detachment, Central vitreous floaters   Disc  Normal   C/D Ratio  0.5   Macula  Age related macular degeneration, less macular thickening, Hard drusen, Retinal pigment epithelial detachment, no hemorrhage   Vessels  no DR   Periphery  Normal            IMAGING AND PROCEDURES  Imaging and Procedures for 08/14/21  OCT, Retina - OU - Both Eyes       Right Eye Quality was  good. Scan locations included subfoveal.  Central Foveal Thickness: 252. Progression has been stable. Findings include retinal drusen , abnormal foveal contour, no SRF, no IRF.   Left Eye Quality was good. Scan locations included subfoveal. Central Foveal Thickness: 305. Progression has worsened. Findings include subretinal fluid, pigment epithelial detachment, choroidal neovascular membrane, intraretinal fluid, abnormal foveal contour.   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  But now at 6 weeks persistence of subretinal fluid OS, stable over time   Incidental media opacity OU       Intravitreal Injection, Pharmacologic Agent - OS - Left Eye       Time Out 08/14/2021. 10:12 AM. Confirmed correct patient, procedure, site, and patient consented.   Anesthesia Topical anesthesia was used. Anesthetic medications included Lidocaine 4%.   Procedure Preparation included Tobramycin 0.3%, 10% betadine to eyelids, 5% betadine to ocular surface. A 30 gauge needle was used.   Injection: 2.5 mg bevacizumab 2.5 MG/0.1ML   Route: Intravitreal, Site: Left Eye   NDC: [phone], Lot: [id number], Expiration date: 10/06/2021   Post-op Post injection exam found visual acuity of at least counting fingers. The patient tolerated the procedure well. There were no complications. The patient received written and verbal post procedure care education. Post injection medications included ocuflox.              ASSESSMENT/PLAN:  Exudative age-related macular degeneration of left eye with active choroidal neovascularization (HCC) History of wet AMD OS.  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  Exudative age-related macular degeneration of left eye with active choroidal neovascularization (HCC)  H35.3221 OCT, Retina - OU - Both Eyes    Intravitreal Injection, Pharmacologic Agent - OS - Left Eye    bevacizumab (AVASTIN) SOSY 2.5 mg    2. Serous detachment of retinal pigment epithelium of left eye  H35.722       1.  OS chronic active subretinal fluid with vascularized PED likely some component of RPE rip associate with wet AMD.  Currently stable acuity and stable configuration of macula at 6-week interval.  We will follow-up next in 6 weeks  2.  Dilate OU likely Avastin OCT OS  3.  Ophthalmic Meds Ordered this visit:  Meds ordered this encounter  Medications   bevacizumab (AVASTIN) SOSY 2.5 mg       Return in about 6 weeks (around 09/25/2021) for DILATE OU, AVASTIN OCT, OS.  There are no Patient Instructions on file for this visit.   Explained the diagnoses, plan, and follow up with the patient and they expressed understanding.  Patient expressed understanding of the importance of proper follow up care.   Clent Demark Jackie Littlejohn M.D. Diseases & Surgery of the Retina and Vitreous Retina & Diabetic [place] 08/14/21     Abbreviations: M myopia (nearsighted); A astigmatism; H hyperopia (farsighted); P presbyopia; Mrx spectacle prescription;  CTL contact lenses; OD right eye; OS left eye; OU both eyes  XT exotropia; ET esotropia; PEK punctate epithelial keratitis; PEE punctate epithelial erosions; DES dry eye syndrome; MGD meibomian gland dysfunction; ATs artificial tears; PFAT's preservative free artificial tears; [place] nuclear sclerotic cataract; PSC posterior subcapsular cataract; ERM epi-retinal  membrane; PVD posterior vitreous detachment; RD retinal detachment; DM diabetes mellitus; DR diabetic retinopathy; NPDR non-proliferative diabetic retinopathy; PDR proliferative diabetic retinopathy; CSME clinically significant macular edema; DME diabetic macular edema; dbh dot blot  hemorrhages; CWS cotton wool spot; POAG primary open angle glaucoma; C/D cup-to-disc ratio; HVF humphrey visual field; GVF goldmann visual field; OCT optical coherence tomography; IOP intraocular pressure; BRVO Branch retinal vein occlusion; CRVO central retinal vein occlusion; CRAO central retinal artery occlusion; BRAO branch retinal artery occlusion; RT retinal tear; SB scleral buckle; PPV pars plana vitrectomy; VH Vitreous hemorrhage; PRP panretinal laser photocoagulation; IVK intravitreal kenalog; VMT vitreomacular traction; MH Macular hole;  NVD neovascularization of the disc; NVE neovascularization elsewhere; AREDS age related eye disease study; ARMD age related macular degeneration; POAG primary open angle glaucoma; EBMD epithelial/anterior basement membrane dystrophy; ACIOL anterior chamber intraocular lens; IOL intraocular lens; PCIOL posterior chamber intraocular lens; Phaco/IOL phacoemulsification with intraocular lens placement; [place] photorefractive keratectomy; LASIK laser assisted in situ keratomileusis; HTN hypertension; DM diabetes mellitus; COPD chronic obstructive pulmonary disease

## 2021-08-14 NOTE — Assessment & Plan Note (Addendum)
History of wet AMD OS.  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

## 2021-08-23 ENCOUNTER — Other Ambulatory Visit: Payer: Self-pay | Admitting: Cardiovascular Disease

## 2021-09-25 ENCOUNTER — Ambulatory Visit (INDEPENDENT_AMBULATORY_CARE_PROVIDER_SITE_OTHER): Payer: Medicare Other | Admitting: Ophthalmology

## 2021-09-25 ENCOUNTER — Encounter (INDEPENDENT_AMBULATORY_CARE_PROVIDER_SITE_OTHER): Payer: Self-pay | Admitting: Ophthalmology

## 2021-09-25 ENCOUNTER — Encounter (INDEPENDENT_AMBULATORY_CARE_PROVIDER_SITE_OTHER): Payer: Medicare Other | Admitting: Ophthalmology

## 2021-09-25 DIAGNOSIS — H353111 Nonexudative age-related macular degeneration, right eye, early dry stage: Secondary | ICD-10-CM

## 2021-09-25 DIAGNOSIS — H35722 Serous detachment of retinal pigment epithelium, left eye: Secondary | ICD-10-CM

## 2021-09-25 DIAGNOSIS — E119 Type 2 diabetes mellitus without complications: Secondary | ICD-10-CM

## 2021-09-25 DIAGNOSIS — H353221 Exudative age-related macular degeneration, left eye, with active choroidal neovascularization: Secondary | ICD-10-CM

## 2021-09-25 MED ORDER — BEVACIZUMAB 2.5 MG/0.1ML IZ SOSY
2.5000 mg | PREFILLED_SYRINGE | INTRAVITREAL | Status: AC | PRN
  Administered 2021-09-25: 2.5 mg via INTRAVITREAL

## 2021-09-25 NOTE — Assessment & Plan Note (Signed)
No sign of CNVM 

## 2021-09-25 NOTE — Progress Notes (Signed)
09/25/2021     CHIEF COMPLAINT Patient presents for  Chief Complaint  Patient presents with   Macular Degeneration      HISTORY OF PRESENT ILLNESS: Peter Mann is a 81 y.o. male who presents to the clinic today for:   HPI   6 weeks for DILATE OU, AVASTIN OCT, OS. Pt stated vision has not changed since last visit.  Last edited by Silvestre Moment on 09/25/2021  9:01 AM.      Referring physician: Nolene Ebbs, MD [place],  [place] [address]  HISTORICAL INFORMATION:   Selected notes from the MEDICAL RECORD NUMBER    Lab Results  Component Value Date   HGBA1C 8.5 (H) 08/23/2015     CURRENT MEDICATIONS: Current Outpatient Medications (Ophthalmic Drugs)  Medication Sig   cycloSPORINE (RESTASIS) 0.05 % ophthalmic emulsion Place 1 drop into the left eye daily.    No current facility-administered medications for this visit. (Ophthalmic Drugs)   Current Outpatient Medications (Other)  Medication Sig   acetaminophen (TYLENOL) 500 MG tablet Take 1,000 mg by mouth every 6 (six) hours as needed for mild pain or headache.   amLODipine (NORVASC) 10 MG tablet Take 10 mg by mouth daily.   aspirin 325 MG tablet Take 1 tablet (325 mg total) by mouth daily.   chlorhexidine (PERIDEX) 0.12 % solution SMARTSIG:15 Milliliter(s) By Mouth Every Morning-Evening   dexlansoprazole (DEXILANT) 60 MG capsule Take 60 mg by mouth daily.   diclofenac sodium (VOLTAREN) 1 % GEL Apply 2 g topically as needed (pain).    Lancets (ONETOUCH DELICA PLUS [id number]) MISC    lubiprostone (AMITIZA) 24 MCG capsule Take 24 mcg by mouth 2 (two) times daily with a meal.   meclizine (ANTIVERT) 25 MG tablet Take 25 mg by mouth 2 (two) times daily.   metoprolol tartrate (LOPRESSOR) 25 MG tablet TAKE 1 TABLET BY MOUTH TWICE A DAY   ONE TOUCH ULTRA TEST test strip    polyethylene glycol (MIRALAX / GLYCOLAX) 17 g packet Take 17 g by mouth daily.   rosuvastatin (CRESTOR) 20 MG tablet Take 20 mg by mouth  daily.   saxagliptin HCl (ONGLYZA) 5 MG TABS tablet Take 5 mg by mouth daily.   tamsulosin (FLOMAX) 0.4 MG CAPS capsule Take 0.4 mg by mouth daily.    No current facility-administered medications for this visit. (Other)      REVIEW OF SYSTEMS: ROS   Negative for: Constitutional, Gastrointestinal, Neurological, Skin, Genitourinary, Musculoskeletal, HENT, Endocrine, Cardiovascular, Eyes, Respiratory, Psychiatric, Allergic/Imm, Heme/Lymph Last edited by Silvestre Moment on 09/25/2021  9:01 AM.       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  These are often spontaneous but are also often at or near the site of low location of an injection should to be placed into the eye.  In the absence of direct blunt or severe trauma these will typically resolve on their own spontaneously and have no impact on the vision.    These are very similar to having a bruise in your  Past Surgical History:  Procedure Laterality Date   CHOLECYSTECTOMY N/A 01/30/2014   Procedure: LAPAROSCOPIC CHOLECYSTECTOMY;  Surgeon: Ralene Ok, MD;  Location: [facility] OR;  Service: General;  Laterality: N/A;    FAMILY HISTORY Family History  Problem Relation Age of Onset   Cancer Brother    Cancer Sister     SOCIAL HISTORY Social History   Tobacco Use   Smoking status: Former    Packs/day: 0.25    Types: Cigarettes    Quit date: 11/19/2018    Years since quitting: 2.8   Smokeless tobacco: Never   Tobacco comments:    smoke 3 to 4 cigarettes a day, 09/08/16 quit 3 wks ago  Substance Use Topics   Alcohol use: No    Alcohol/week: 0.0  standard drinks of alcohol    Comment: quit 25 years ago though before "drank so much that you couldn't tell b/t alcohol and wine"   Drug use: No         OPHTHALMIC EXAM:  Base Eye Exam     Visual Acuity (ETDRS)       Right Left   Dist [place] 20/30 -2 20/150 -1   Dist ph [place] 20/25 20/100 -1         Tonometry (Tonopen, 9:10 AM)       Right Left   Pressure 15 19         Pachymetry (11/30/2019)       Right Left   Thickness 21 18         Pupils       Pupils Shape APD   Right PERRL Round None   Left PERRL Irregular None         Visual Fields       Left Right    Full Full         Neuro/Psych     Oriented x3: Yes   Mood/Affect: Normal         Dilation     Both eyes: 1.0% Mydriacyl, 2.5% Phenylephrine @ 9:10 AM           Slit Lamp and Fundus Exam     External Exam       Right Left   External Normal Normal         Slit Lamp Exam       Right Left   Lids/Lashes Normal Normal   Conjunctiva/Sclera White and quiet White and quiet   Cornea Clear Clear   Anterior Chamber Deep and quiet Deep and quiet   Iris Round and reactive Round and reactive   Lens 2+ Nuclear sclerosis Centered posterior chamber intraocular lens, small retained cortex superiorly 1130, and 7:00 meridian peripherally, not a visual axis   Anterior Vitreous Normal small floaters, oil from prior needle injections         Fundus Exam       Right Left   Posterior Vitreous  Posterior vitreous detachment, Central vitreous floaters   Disc  Normal   C/D Ratio  0.5   Macula  Age related macular degeneration, less macular thickening, Hard drusen, Retinal pigment epithelial detachment, no hemorrhage   Vessels  no DR   Periphery  Normal            IMAGING AND PROCEDURES  Imaging and Procedures for 09/25/21  OCT, Retina - OU - Both Eyes       Right Eye Quality was good. Scan locations included subfoveal. Central Foveal Thickness: 252. Progression has been stable.  Findings include no IRF, no SRF, abnormal foveal contour, retinal drusen .   Left Eye Quality was good. Scan locations included subfoveal. Central Foveal Thickness: 281. Progression has worsened. Findings include abnormal foveal contour, choroidal neovascular membrane, intraretinal fluid, pigment epithelial detachment, subretinal fluid.   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  But now at 6 weeks persistence of subretinal fluid OS, stable over time   Incidental media opacity OU        Intravitreal Injection, Pharmacologic Agent - OS - Left Eye       Time Out 09/25/2021. 9:44 AM. Confirmed correct patient, procedure, site, and patient consented.   Anesthesia Topical anesthesia was used. Anesthetic medications included Lidocaine 4%.   Procedure Preparation included 5% betadine to ocular surface, 10% betadine to eyelids, Tobramycin 0.3%. A 30 gauge needle was used.   Injection: 2.5 mg bevacizumab 2.5 MG/0.1ML   Route: Intravitreal, Site: Left Eye   NDC: [phone], Lot: [id number], Expiration date: 11/17/2021, Waste: 0 mL   Post-op Post injection exam found visual acuity of at least counting fingers. The patient tolerated the procedure well. There were no complications. The patient received written and verbal post procedure care education. Post injection medications included ocuflox.              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  Repeat injection today reevaluate in 6 weeks     ICD-10-CM   1. Exudative age-related macular degeneration of left eye with active choroidal neovascularization (HCC)  H35.3221 OCT, Retina - OU - Both Eyes    Intravitreal Injection, Pharmacologic Agent - OS - Left Eye    bevacizumab (AVASTIN) SOSY 2.5 mg    2. Serous detachment of retinal pigment epithelium of left eye  H35.722     3. Diabetes mellitus type 2, noninsulin dependent (HCC)  E11.9     4. 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  OS stable acuity doing well at current interval follow-up examination in 6 weeks.  Controlled condition stable acuity.  Chronic active vascularized PED with serous detachment.  2.  To maintain, repeat Avastin OS today reevaluate again in 6 weeks  3.  Ophthalmic Meds Ordered this visit:  Meds ordered this encounter  Medications   bevacizumab (AVASTIN) SOSY 2.5 mg       Return in about 6 weeks (around 11/06/2021) for DILATE OU, AVASTIN OCT, OS.  There are no Patient Instructions on file for this visit.   Explained the diagnoses, plan, and follow up with the patient and they expressed understanding.  Patient expressed understanding of the importance of proper follow up care.   Peter Mann M.D. Diseases & Surgery of the Retina and Vitreous Retina & Diabetic [place] 09/25/21     Abbreviations: M myopia (nearsighted); A astigmatism; H hyperopia (farsighted); P presbyopia; Mrx spectacle prescription;  CTL contact lenses; OD right eye; OS left eye; OU both eyes  XT exotropia; ET esotropia; PEK punctate epithelial keratitis; PEE  punctate epithelial erosions; DES dry eye syndrome; MGD meibomian gland dysfunction; ATs artificial tears; PFAT's preservative free artificial tears; [place] nuclear sclerotic cataract; PSC posterior subcapsular cataract; ERM epi-retinal membrane; PVD posterior vitreous detachment; RD retinal  detachment; DM diabetes mellitus; DR diabetic retinopathy; NPDR non-proliferative diabetic retinopathy; PDR proliferative diabetic retinopathy; CSME clinically significant macular edema; DME diabetic macular edema; dbh dot blot hemorrhages; CWS cotton wool spot; POAG primary open angle glaucoma; C/D cup-to-disc ratio; HVF humphrey visual field; GVF goldmann visual field; OCT optical coherence tomography; IOP intraocular pressure; BRVO Branch retinal vein occlusion; CRVO central retinal vein occlusion; CRAO central retinal artery occlusion; BRAO branch retinal artery occlusion; RT retinal tear; SB scleral buckle; PPV pars plana vitrectomy; VH Vitreous hemorrhage; PRP panretinal laser photocoagulation; IVK intravitreal kenalog; VMT vitreomacular traction; MH Macular hole;  NVD neovascularization of the disc; NVE neovascularization elsewhere; AREDS age related eye disease study; ARMD age related macular degeneration; POAG primary open angle glaucoma; EBMD epithelial/anterior basement membrane dystrophy; ACIOL anterior chamber intraocular lens; IOL intraocular lens; PCIOL posterior chamber intraocular lens; Phaco/IOL phacoemulsification with intraocular lens placement; [place] photorefractive keratectomy; LASIK laser assisted in situ keratomileusis; HTN hypertension; DM diabetes mellitus; COPD chronic obstructive pulmonary disease

## 2021-09-25 NOTE — Assessment & Plan Note (Signed)
Ongoing component of subretinal fluid with CNVM overall slightly improved

## 2021-09-25 NOTE — Assessment & Plan Note (Signed)
Chronic active fluctuating disease stable acuity yet stabilized 6-week interval.  Repeat injection today reevaluate in 6 weeks

## 2021-09-25 NOTE — Assessment & Plan Note (Signed)
No DR ?

## 2021-11-06 ENCOUNTER — Encounter (INDEPENDENT_AMBULATORY_CARE_PROVIDER_SITE_OTHER): Payer: Medicare Other | Admitting: Ophthalmology

## 2021-11-07 ENCOUNTER — Encounter (INDEPENDENT_AMBULATORY_CARE_PROVIDER_SITE_OTHER): Payer: Self-pay | Admitting: Ophthalmology

## 2021-11-07 ENCOUNTER — Ambulatory Visit (INDEPENDENT_AMBULATORY_CARE_PROVIDER_SITE_OTHER): Payer: Medicare Other | Admitting: Ophthalmology

## 2021-11-07 DIAGNOSIS — H353111 Nonexudative age-related macular degeneration, right eye, early dry stage: Secondary | ICD-10-CM

## 2021-11-07 DIAGNOSIS — H353221 Exudative age-related macular degeneration, left eye, with active choroidal neovascularization: Secondary | ICD-10-CM

## 2021-11-07 DIAGNOSIS — H2511 Age-related nuclear cataract, right eye: Secondary | ICD-10-CM

## 2021-11-07 DIAGNOSIS — E119 Type 2 diabetes mellitus without complications: Secondary | ICD-10-CM

## 2021-11-07 DIAGNOSIS — H35722 Serous detachment of retinal pigment epithelium, left eye: Secondary | ICD-10-CM

## 2021-11-07 DIAGNOSIS — H43811 Vitreous degeneration, right eye: Secondary | ICD-10-CM

## 2021-11-07 MED ORDER — BEVACIZUMAB CHEMO INJECTION 1.25MG/0.05ML SYRINGE FOR KALEIDOSCOPE
1.2500 mg | INTRAVITREAL | Status: AC | PRN
  Administered 2021-11-07: 1.25 mg via INTRAVITREAL

## 2021-11-07 NOTE — Assessment & Plan Note (Signed)
Minimal, no high risk features

## 2021-11-07 NOTE — Assessment & Plan Note (Signed)
Moderate OD continue to evaluate with Dr. Marylynn Pearson

## 2021-11-07 NOTE — Assessment & Plan Note (Signed)
Physiologic no holes

## 2021-11-07 NOTE — Assessment & Plan Note (Signed)
No detectable diabetic retinopathy 

## 2021-11-07 NOTE — Progress Notes (Signed)
11/07/2021     CHIEF COMPLAINT Patient presents for  Chief Complaint  Patient presents with   Macular Degeneration      HISTORY OF PRESENT ILLNESS: Peter Mann is a 81 y.o. male who presents to the clinic today for:   HPI   6 WEEKS FOR DILATE OU, AVASTIN OCT, OS. Pt stated vision has not changed since last visit. Last edited by Silvestre Moment on 11/07/2021 10:37 AM.      Referring physician: Marylynn Pearson, MD [address],  [place] [address]  HISTORICAL INFORMATION:   Selected notes from the MEDICAL RECORD NUMBER    Lab Results  Component Value Date   HGBA1C 8.5 (H) 08/23/2015     CURRENT MEDICATIONS: Current Outpatient Medications (Ophthalmic Drugs)  Medication Sig   cycloSPORINE (RESTASIS) 0.05 % ophthalmic emulsion Place 1 drop into the left eye daily.    No current facility-administered medications for this visit. (Ophthalmic Drugs)   Current Outpatient Medications (Other)  Medication Sig   acetaminophen (TYLENOL) 500 MG tablet Take 1,000 mg by mouth every 6 (six) hours as needed for mild pain or headache.   amLODipine (NORVASC) 10 MG tablet Take 10 mg by mouth daily.   aspirin 325 MG tablet Take 1 tablet (325 mg total) by mouth daily.   chlorhexidine (PERIDEX) 0.12 % solution SMARTSIG:15 Milliliter(s) By Mouth Every Morning-Evening   dexlansoprazole (DEXILANT) 60 MG capsule Take 60 mg by mouth daily.   diclofenac sodium (VOLTAREN) 1 % GEL Apply 2 g topically as needed (pain).    Lancets (ONETOUCH DELICA PLUS [id number]) MISC    lubiprostone (AMITIZA) 24 MCG capsule Take 24 mcg by mouth 2 (two) times daily with a meal.   meclizine (ANTIVERT) 25 MG tablet Take 25 mg by mouth 2 (two) times daily.   metoprolol tartrate (LOPRESSOR) 25 MG tablet TAKE 1 TABLET BY MOUTH TWICE A DAY   ONE TOUCH ULTRA TEST test strip    polyethylene glycol (MIRALAX / GLYCOLAX) 17 g packet Take 17 g by mouth daily.   rosuvastatin (CRESTOR) 20 MG tablet Take 20 mg by mouth  daily.   saxagliptin HCl (ONGLYZA) 5 MG TABS tablet Take 5 mg by mouth daily.   tamsulosin (FLOMAX) 0.4 MG CAPS capsule Take 0.4 mg by mouth daily.    No current facility-administered medications for this visit. (Other)      REVIEW OF SYSTEMS: ROS   Negative for: Constitutional, Gastrointestinal, Neurological, Skin, Genitourinary, Musculoskeletal, HENT, Endocrine, Cardiovascular, Eyes, Respiratory, Psychiatric, Allergic/Imm, Heme/Lymph Last edited by Silvestre Moment on 11/07/2021 10:37 AM.       ALLERGIES No Known Allergies  PAST MEDICAL HISTORY Past Medical History:  Diagnosis Date   Cancer ([place])    prostrate ca, radiation treatments   Chest pain    Diabetes mellitus without complication ([place])    Dizziness    History of tobacco abuse    Hyperlipidemia    Hypertension    Stroke [facility])    Subconjunctival hemorrhage of left eye 07/18/2019   The condition of the involved eye is that of a subconjunctival hemorrhage.  These are often spontaneous but are also often at or near the site of low location of an injection should to be placed into the eye.  In the absence of direct blunt or severe trauma these will typically resolve on their own spontaneously and have no impact on the vision.    These are very similar to having a bruise in your  Past Surgical History:  Procedure Laterality Date   CHOLECYSTECTOMY N/A 01/30/2014   Procedure: LAPAROSCOPIC CHOLECYSTECTOMY;  Surgeon: Ralene Ok, MD;  Location: [facility] OR;  Service: General;  Laterality: N/A;    FAMILY HISTORY Family History  Problem Relation Age of Onset   Cancer Brother    Cancer Sister     SOCIAL HISTORY Social History   Tobacco Use   Smoking status: Former    Packs/day: 0.25    Types: Cigarettes    Quit date: 11/19/2018    Years since quitting: 2.9   Smokeless tobacco: Never   Tobacco comments:    smoke 3 to 4 cigarettes a day, 09/08/16 quit 3 wks ago  Substance Use Topics   Alcohol use: No    Alcohol/week: 0.0  standard drinks of alcohol    Comment: quit 25 years ago though before "drank so much that you couldn't tell b/t alcohol and wine"   Drug use: No         OPHTHALMIC EXAM:  Base Eye Exam     Visual Acuity (ETDRS)       Right Left   Dist [place] 20/20 -2 20/400   Dist ph [place]  20/200         Tonometry (Tonopen, 10:44 AM)       Right Left   Pressure 16 13         Pachymetry (11/30/2019)       Right Left   Thickness 21 18         Pupils       Pupils Shape APD   Right PERRL Round None   Left PERRL Irregular None         Visual Fields       Left Right    Full Full         Extraocular Movement       Right Left    Full, Ortho Full, Ortho         Neuro/Psych     Oriented x3: Yes   Mood/Affect: Normal         Dilation     1.0% Mydriacyl, 2.5% Phenylephrine @ 10:44 AM           Slit Lamp and Fundus Exam     External Exam       Right Left   External Normal Normal         Slit Lamp Exam       Right Left   Lids/Lashes Normal Normal   Conjunctiva/Sclera White and quiet White and quiet   Cornea Clear Clear   Anterior Chamber Deep and quiet Deep and quiet   Iris Round and reactive Round and reactive   Lens 2+ Nuclear sclerosis Centered posterior chamber intraocular lens, small retained cortex superiorly 1130, and 7:00 meridian peripherally, not a visual axis   Anterior Vitreous Normal small floaters, oil from prior needle injections         Fundus Exam       Right Left   Posterior Vitreous Normal Posterior vitreous detachment, Central vitreous floaters   Disc Normal Normal   C/D Ratio 0.5 0.5   Macula Hard drusen, Early age related macular degeneration Age related macular degeneration, less macular thickening, Hard drusen, Retinal pigment epithelial detachment, no hemorrhage   Vessels no DR no DR   Periphery Normal Normal            IMAGING AND PROCEDURES  Imaging and Procedures for 11/07/21  OCT, Retina -  OU - Both Eyes        Right Eye Quality was good. Scan locations included subfoveal. Central Foveal Thickness: 257. Progression has been stable. Findings include no IRF, no SRF, abnormal foveal contour, retinal drusen .   Left Eye Quality was good. Scan locations included subfoveal. Central Foveal Thickness: 296. Progression has been stable. Findings include abnormal foveal contour, choroidal neovascular membrane, intraretinal fluid, pigment epithelial detachment, subretinal fluid.   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  But now at 6 weeks persistence of subretinal fluid OS, stable over time   Incidental media opacity OD        Intravitreal Injection, Pharmacologic Agent - OS - Left Eye       Time Out 11/07/2021. 11:34 AM. Confirmed correct patient, procedure, site, and patient consented.   Anesthesia Topical anesthesia was used. Anesthetic medications included Lidocaine 4%.   Procedure Preparation included 5% betadine to ocular surface, 10% betadine to eyelids, Tobramycin 0.3%. A 30 gauge needle was used.   Injection: 1.25 mg Bevacizumab 1.'25mg'$ /0.20m   Route: Intravitreal, Site: Left Eye   NDC: [id number] Lot: [id number] Expiration date: 01/22/2022   Post-op Post injection exam found visual acuity of at least counting fingers. The patient tolerated the procedure well. There were no complications. The patient received written and verbal post procedure care education. Post injection medications included ocuflox.              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   Stable acuity over time in the pseudophakic eye even with ongoing therapy.  At 6-week interval.  We will therefore We will thus continue to treat today with Avastin but will extend interval examination to 9  weeks   Nuclear sclerotic cataract of right eye Moderate OD continue to evaluate with Dr. 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 Exudative age-related macular degeneration of left eye with active choroidal neovascularization (HCC)  H35.3221 OCT, Retina - OU - Both Eyes    Intravitreal Injection, Pharmacologic Agent - OS - Left Eye    Bevacizumab (AVASTIN) SOLN 1.25 mg    2. Serous detachment of retinal pigment epithelium of left eye  H35.722     3. Nuclear sclerotic cataract of right eye  H25.11     4. Posterior vitreous detachment of right eye  H43.811     5. 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 Diabetes mellitus type 2, noninsulin dependent (HCC)  E11.9       1.  OS we will continue with treatment to treat wet AMD subfoveal serous elevation detachment associate with RPE rip and serous retinal detachment.  With stable acuity will go ahead and extend interval examination next to 9 weeks after injection Avastin OS today  2.  OU no sign of DR  3.  Moderate NSC OD follow-up Dr. WVenetia Maxonas scheduled  Ophthalmic Meds Ordered this visit:  Meds ordered this encounter  Medications   Bevacizumab (AVASTIN) SOLN 1.25 mg       Return in about 9 weeks (around  01/09/2022) for dilate, OS, AVASTIN OCT.  There are no Patient Instructions on file for this visit.   Explained the diagnoses, plan, and follow up with the patient and they expressed understanding.  Patient expressed understanding of the importance of proper follow up care.   Clent Demark Alyla Pietila M.D. Diseases & Surgery of the Retina and Vitreous [facility] [place] 11/07/21     Abbreviations: M myopia  (nearsighted); A astigmatism; H hyperopia (farsighted); P presbyopia; Mrx spectacle prescription;  CTL contact lenses; OD right eye; OS left eye; OU both eyes  XT exotropia; ET esotropia; PEK punctate epithelial keratitis; PEE punctate epithelial erosions; DES dry eye syndrome; MGD meibomian gland dysfunction; ATs artificial tears; PFAT's preservative free artificial tears; [place] nuclear sclerotic cataract; PSC posterior subcapsular cataract; ERM epi-retinal membrane; PVD posterior vitreous detachment; RD retinal detachment; DM diabetes mellitus; DR diabetic retinopathy; NPDR non-proliferative diabetic retinopathy; PDR proliferative diabetic retinopathy; CSME clinically significant macular edema; DME diabetic macular edema; dbh dot blot hemorrhages; CWS cotton wool spot; POAG primary open angle glaucoma; C/D cup-to-disc ratio; HVF humphrey visual field; GVF goldmann visual field; OCT optical coherence tomography; IOP intraocular pressure; BRVO Branch retinal vein occlusion; CRVO central retinal vein occlusion; CRAO central retinal artery occlusion; BRAO branch retinal artery occlusion; RT retinal tear; SB scleral buckle; PPV pars plana vitrectomy; VH Vitreous hemorrhage; PRP panretinal laser photocoagulation; IVK intravitreal kenalog; VMT vitreomacular traction; MH Macular hole;  NVD neovascularization of the disc; NVE neovascularization elsewhere; AREDS age related eye disease study; ARMD age related macular degeneration; POAG primary open angle glaucoma; EBMD epithelial/anterior basement membrane dystrophy; ACIOL anterior chamber intraocular lens; IOL intraocular lens; PCIOL posterior chamber intraocular lens; Phaco/IOL phacoemulsification with intraocular lens placement; [place] photorefractive keratectomy; LASIK laser assisted in situ keratomileusis; HTN hypertension; DM diabetes mellitus; COPD chronic obstructive pulmonary disease

## 2021-11-07 NOTE — Assessment & Plan Note (Signed)
Chronic active associated with RPE rip and wet AMD.   Stable acuity over time in the pseudophakic eye even with ongoing therapy.  At 6-week interval.  We will therefore We will thus continue to treat today with Avastin but will extend interval examination to 9 weeks

## 2022-01-09 ENCOUNTER — Encounter (INDEPENDENT_AMBULATORY_CARE_PROVIDER_SITE_OTHER): Payer: Medicare Other | Admitting: Ophthalmology

## 2022-02-25 ENCOUNTER — Encounter: Payer: Self-pay | Admitting: Cardiovascular Disease

## 2022-02-25 ENCOUNTER — Ambulatory Visit: Payer: Medicare Other | Attending: Cardiovascular Disease | Admitting: Cardiovascular Disease

## 2022-02-25 VITALS — BP 126/72 | HR 57 | Ht 68.0 in | Wt 186.6 lb

## 2022-02-25 DIAGNOSIS — I63411 Cerebral infarction due to embolism of right middle cerebral artery: Secondary | ICD-10-CM | POA: Diagnosis not present

## 2022-02-25 DIAGNOSIS — F17200 Nicotine dependence, unspecified, uncomplicated: Secondary | ICD-10-CM | POA: Diagnosis not present

## 2022-02-25 DIAGNOSIS — E782 Mixed hyperlipidemia: Secondary | ICD-10-CM

## 2022-02-25 DIAGNOSIS — I10 Essential (primary) hypertension: Secondary | ICD-10-CM

## 2022-02-25 NOTE — Progress Notes (Signed)
02/25/2022 Peter Mann   02/04/1941  [id number]  Primary Physician Nolene Ebbs, MD Primary Cardiologist: Lorretta Harp MD [place] Car[place], [place]  HPI:  Peter Mann is a 81 y.o.   mildly overweight separated African American male father of 2 children referred by Dr. Pierce Crane for cardiovascular evaluation because of chest pain. I last saw him in the office 02/20/2021. He is retired from working [employer], [employer]. His cardiac risk factor profile is positive for 100-pack-year history of tobacco abuse still continuing to smoke off and on. History of hypertension and dyslipidemia. He also has a history of prostate cancer. Because of chest pain several years ago he underwent Myoview stress testing which was normal at that time (03/11/13). He was recently admitted with a stroke 08/23/15. CT scan showed acute infarct of the right posterior frontal lobe. His aspirin was increased to full dose and he was scheduled for a TEE and loop recorder implantation however he left prior to having this performed .   Since I saw him a year ago he is remained stable.  He continues to smoke although infrequently.  He gets occasional brief chest pain several times a year but this has not changed in frequency or severity.  He denies shortness of breath.   Current Meds  Medication Sig   acetaminophen (TYLENOL) 500 MG tablet Take 1,000 mg by mouth every 6 (six) hours as needed for mild pain or headache.   amLODipine (NORVASC) 10 MG tablet Take 10 mg by mouth daily.   aspirin 325 MG tablet Take 1 tablet (325 mg total) by mouth daily.   chlorhexidine (PERIDEX) 0.12 % solution SMARTSIG:15 Milliliter(s) By Mouth Every Morning-Evening   cycloSPORINE (RESTASIS) 0.05 % ophthalmic emulsion Place 1 drop into the left eye daily.    dexlansoprazole (DEXILANT) 60 MG capsule Take 60 mg by mouth daily.   diclofenac sodium (VOLTAREN) 1 % GEL Apply 2 g topically as needed (pain).    JARDIANCE 25 MG TABS tablet Take  25 mg by mouth daily.   Lancets (ONETOUCH DELICA PLUS [id number]) MISC    meclizine (ANTIVERT) 25 MG tablet Take 25 mg by mouth 2 (two) times daily.   metoprolol tartrate (LOPRESSOR) 25 MG tablet TAKE 1 TABLET BY MOUTH TWICE A DAY   ONE TOUCH ULTRA TEST test strip    polyethylene glycol (MIRALAX / GLYCOLAX) 17 g packet Take 17 g by mouth daily.   rosuvastatin (CRESTOR) 20 MG tablet Take 20 mg by mouth daily.   saxagliptin HCl (ONGLYZA) 5 MG TABS tablet Take 5 mg by mouth daily.   tamsulosin (FLOMAX) 0.4 MG CAPS capsule Take 0.4 mg by mouth daily.    tiZANidine (ZANAFLEX) 4 MG tablet Take 4 mg by mouth every 6 (six) hours as needed.     No Known Allergies  Social History   Socioeconomic History   Marital status: Legally Separated    Spouse name: Not on file   Number of children: 3   Years of education: 7   Highest education level: Not on file  Occupational History   Not on file  Tobacco Use   Smoking status: Former    Packs/day: 0.25    Types: Cigarettes    Quit date: 11/19/2018    Years since quitting: 3.2   Smokeless tobacco: Never   Tobacco comments:    smoke 3 to 4 cigarettes a day, 09/08/16 quit 3 wks ago  Substance and Sexual Activity   Alcohol use: No  Alcohol/week: 0.0 standard drinks of alcohol    Comment: quit 25 years ago though before "drank so much that you couldn't tell b/t alcohol and wine"   Drug use: No   Sexual activity: Never  Other Topics Concern   Not on file  Social History Narrative   Lives alone   Social Determinants of Health   Financial Resource Strain: Not on file  Food Insecurity: Not on file  Transportation Needs: Not on file  Physical Activity: Not on file  Stress: Not on file  Social Connections: Not on file  Intimate Partner Violence: Not on file     Review of Systems: General: negative for chills, fever, night sweats or weight changes.  Cardiovascular: negative for chest pain, dyspnea on exertion, edema, orthopnea, palpitations,  paroxysmal nocturnal dyspnea or shortness of breath Dermatological: negative for rash Respiratory: negative for cough or wheezing Urologic: negative for hematuria Abdominal: negative for nausea, vomiting, diarrhea, bright red blood per rectum, melena, or hematemesis Neurologic: negative for visual changes, syncope, or dizziness All other systems reviewed and are otherwise negative except as noted above.    Blood pressure 126/72, pulse (!) 57, height '5\' 8"'$  (1.727 m), weight 186 lb 9.6 oz (84.6 kg), SpO2 97 %.  General appearance: alert and no distress Neck: no adenopathy, no carotid bruit, no JVD, supple, symmetrical, trachea midline, and thyroid not enlarged, symmetric, no tenderness/mass/nodules Lungs: clear to auscultation bilaterally Heart: regular rate and rhythm, S1, S2 normal, no murmur, click, rub or gallop Extremities: extremities normal, atraumatic, no cyanosis or edema Pulses: 2+ and symmetric Skin: Skin color, texture, turgor normal. No rashes or lesions Neurologic: Grossly normal  EKG sinus bradycardia at 57 with nonspecific ST and T wave changes.  I personally reviewed this EKG.  ASSESSMENT AND PLAN:   Essential hypertension History of essential hypertension blood pressure measured today at 126/72.  He is on amlodipine, and metoprolol.  Hyperlipidemia History of hyperlipidemia on rosuvastatin followed by his PCP  CVA (cerebral vascular accident) ([place]) History of stroke in the past.  There was intent to place a TEE and loop recorder however he left prior to this being performed.  Smoker Continue tobacco abuse of 5 cigarettes a day.     Lorretta Harp MD FACP,FACC,FAHA, [facility] 02/25/2022 8:33 AM

## 2022-02-25 NOTE — Assessment & Plan Note (Signed)
History of stroke in the past.  There was intent to place a TEE and loop recorder however he left prior to this being performed.

## 2022-02-25 NOTE — Assessment & Plan Note (Signed)
Continue tobacco abuse of 5 cigarettes a day.

## 2022-02-25 NOTE — Assessment & Plan Note (Signed)
History of hyperlipidemia on rosuvastatin followed by his PCP

## 2022-02-25 NOTE — Assessment & Plan Note (Signed)
History of essential hypertension blood pressure measured today at 126/72.  He is on amlodipine, and metoprolol.

## 2022-02-25 NOTE — Patient Instructions (Signed)
Medication Instructions:  Your physician recommends that you continue on your current medications as directed. Please refer to the Current Medication list given to you today.  *If you need a refill on your cardiac medications before your next appointment, please call your pharmacy*   Follow-Up: At [facility], you and your health needs are our priority.  As part of our continuing mission to provide you with exceptional heart care, we have created designated Provider Care Teams.  These Care Teams include your primary Cardiologist (physician) and Advanced Practice Providers (APPs -  Physician Assistants and Nurse Practitioners) who all work together to provide you with the care you need, when you need it.  We recommend signing up for the patient portal called "MyChart".  Sign up information is provided on this After Visit Summary.  MyChart is used to connect with patients for Virtual Visits (Telemedicine).  Patients are able to view lab/test results, encounter notes, upcoming appointments, etc.  Non-urgent messages can be sent to your provider as well.   To learn more about what you can do with MyChart, go to [email or web address].    Your next appointment:   We will see you on an as needed basis.  Provider:   Jonathan Berry, MD  

## 2022-03-01 ENCOUNTER — Other Ambulatory Visit: Payer: Self-pay | Admitting: Cardiovascular Disease

## 2022-04-08 ENCOUNTER — Other Ambulatory Visit: Payer: Self-pay | Admitting: Internal Medicine

## 2022-04-09 LAB — CBC
HCT: 36.7 % — ABNORMAL LOW (ref 38.5–50.0)
Hemoglobin: 11.1 g/dL — ABNORMAL LOW (ref 13.2–17.1)
MCH: 23.8 pg — ABNORMAL LOW (ref 27.0–33.0)
MCHC: 30.2 g/dL — ABNORMAL LOW (ref 32.0–36.0)
MCV: 78.8 fL — ABNORMAL LOW (ref 80.0–100.0)
MPV: 10 fL (ref 7.5–12.5)
Platelets: 248 10*3/uL (ref 140–400)
RBC: 4.66 10*6/uL (ref 4.20–5.80)
RDW: 16.4 % — ABNORMAL HIGH (ref 11.0–15.0)
WBC: 3.4 10*3/uL — ABNORMAL LOW (ref 3.8–10.8)

## 2022-04-09 LAB — COMPLETE METABOLIC PANEL WITH GFR
AG Ratio: 1.5 (calc) (ref 1.0–2.5)
ALT: 8 U/L — ABNORMAL LOW (ref 9–46)
AST: 13 U/L (ref 10–35)
Albumin: 4.1 g/dL (ref 3.6–5.1)
Alkaline phosphatase (APISO): 75 U/L (ref 35–144)
BUN/Creatinine Ratio: 17 (calc) (ref 6–22)
BUN: 21 mg/dL (ref 7–25)
CO2: 25 mmol/L (ref 20–32)
Calcium: 8.9 mg/dL (ref 8.6–10.3)
Chloride: 105 mmol/L (ref 98–110)
Creat: 1.27 mg/dL — ABNORMAL HIGH (ref 0.70–1.22)
Globulin: 2.7 g/dL (calc) (ref 1.9–3.7)
Glucose, Bld: 263 mg/dL — ABNORMAL HIGH (ref 65–99)
Potassium: 3.8 mmol/L (ref 3.5–5.3)
Sodium: 142 mmol/L (ref 135–146)
Total Bilirubin: 0.3 mg/dL (ref 0.2–1.2)
Total Protein: 6.8 g/dL (ref 6.1–8.1)
eGFR: 56 mL/min/{1.73_m2} — ABNORMAL LOW (ref >=60)

## 2022-04-09 LAB — TSH: TSH: 2.14 mIU/L (ref 0.40–4.50)

## 2022-04-09 LAB — LIPID PANEL
Cholesterol: 121 mg/dL (ref <200)
HDL: 27 mg/dL — ABNORMAL LOW (ref >=40)
LDL Cholesterol (Calc): 63 mg/dL (calc)
Non-HDL Cholesterol (Calc): 94 mg/dL (calc) (ref <130)
Total CHOL/HDL Ratio: 4.5 (calc) (ref <5.0)
Triglycerides: 267 mg/dL — ABNORMAL HIGH (ref <150)

## 2022-04-09 LAB — FOLATE: Folate: 14 ng/mL

## 2022-04-09 LAB — VITAMIN B12: Vitamin B-12: 266 pg/mL (ref 200–1100)

## 2022-05-15 ENCOUNTER — Encounter (HOSPITAL_COMMUNITY): Payer: Self-pay | Admitting: Emergency Medicine

## 2022-05-15 ENCOUNTER — Ambulatory Visit (HOSPITAL_COMMUNITY)
Admission: EM | Admit: 2022-05-15 | Discharge: 2022-05-15 | Disposition: A | Discharge status: Home / Self Care | Payer: 59 | Attending: Internal Medicine | Admitting: Internal Medicine

## 2022-05-15 ENCOUNTER — Other Ambulatory Visit: Payer: Self-pay

## 2022-05-15 DIAGNOSIS — K5901 Slow transit constipation: Secondary | ICD-10-CM | POA: Diagnosis not present

## 2022-05-15 DIAGNOSIS — E669 Obesity, unspecified: Secondary | ICD-10-CM | POA: Diagnosis not present

## 2022-05-15 DIAGNOSIS — E1169 Type 2 diabetes mellitus with other specified complication: Secondary | ICD-10-CM

## 2022-05-15 LAB — CBG MONITORING, ED: Glucose-Capillary: 173 mg/dL — ABNORMAL HIGH (ref 70–99)

## 2022-05-15 MED ORDER — SENNOSIDES-DOCUSATE SODIUM 8.6-50 MG PO TABS: 1.0000 | ORAL_TABLET | Freq: Every day | ORAL | 1 refills | Status: AC

## 2022-05-15 MED ORDER — POLYETHYLENE GLYCOL 3350 17 G PO PACK: 17.0000 g | PACK | Freq: Every day | ORAL | 0 refills | Status: AC | PRN

## 2022-05-15 NOTE — Discharge Instructions (Addendum)
Please increase fiber and fluid intake Increase oral fluid intake Your blood sugar was 171 Continue to exercise for at least 150 minutes every week Please return to urgent care if you have any other concerns.

## 2022-05-15 NOTE — ED Triage Notes (Signed)
Last BM was 2-3 days ago.  Reports it is sometimes one week.    Patient also has concerns for cbg reading-readings are high and low-both extremes.    No new pain-has hurt "for years"  Sees his PCP in 3 weeks

## 2022-05-16 NOTE — ED Provider Notes (Signed)
[place]    CSN: [id number] Arrival date & time: 05/15/22  1235      History   Chief Complaint Chief Complaint  Patient presents with   Constipation    HPI Grover Robinson is a 82 y.o. male with a history of constipation comes to the urgent care with complaints of no bowel movement over the past 2 to 3 days.  He denies any abdominal pain or distention.  No nausea or vomiting.  Patient denies any narcotic medication use.  His fruit and vegetable intake is not optimal.  Patient is also diabetic and has concerns that his blood sugars may be high.  On further questioning patient has not tested his blood sugar over the past several days.  He does not give a clear reason why he has not tested.  No polyuria, polydipsia, polyphagia or abdominal pain.  No chest pain or chest pressure. 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  These are often spontaneous but are also often at or near the site of low location of an injection should to be placed into the eye.  In the absence of direct blunt or severe trauma these will typically resolve on their own spontaneously and have no impact on the vision.    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[place]) 07/11/2019   Serous detachment of retinal pigment epithelium of left eye 07/11/2019   Posterior vitreous detachment of right eye 07/11/2019   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 07/11/2019   Posterior vitreous detachment of left eye 07/11/2019   Smoker 03/17/2016   Facial palsy    Acute CVA (cerebrovascular accident) ([place])    HLD (hyperlipidemia)    Diabetes mellitus type 2, noninsulin dependent ([place])    CVA (cerebral vascular accident) ([place]) 08/23/2015   Abdominal distention    Gangrenous cholecystitis    Ileus ([place])    Abdominal pain 01/28/2014   Cholelithiasis 01/28/2014   Cholecystitis 01/28/2014   Symptomatic cholelithiasis 01/28/2014   Hyperglycemia 01/28/2014   Chest pain 02/01/2013   Essential hypertension 02/01/2013   Hyperlipidemia 02/01/2013    Past Surgical History:  Procedure Laterality Date   CHOLECYSTECTOMY N/A 01/30/2014   Procedure: LAPAROSCOPIC CHOLECYSTECTOMY;  Surgeon: Ralene Ok, MD;  Location: [place];  Service: General;  Laterality: N/A;       Home Medications    Prior to Admission medications   Medication Sig Start Date End Date Taking? Authorizing Provider  polyethylene glycol (MIRALAX) 17 g packet Take 17 g by mouth daily as needed for moderate constipation or mild constipation (if no bowel movement in 2 days). 05/15/22  Yes Clarrisa Kaylor, Myrene Galas, MD  senna-docusate (SENOKOT-S) 8.6-50 MG tablet Take 1 tablet by mouth at bedtime. 05/15/22  Yes Harshith Pursell, Myrene Galas, MD  acetaminophen (TYLENOL) 500 MG tablet Take 1,000  mg by mouth every 6 (six) hours as needed for mild pain or headache.    [provider]  amLODipine (NORVASC) 10 MG tablet Take 10 mg by mouth daily.    [provider]  aspirin 325 MG tablet Take 1 tablet (325 mg total) by mouth daily. 08/27/15   Florencia Reasons, MD  chlorhexidine (PERIDEX) 0.12 % solution [id number]:15 Milliliter(s) By Mouth Every Morning-Evening 09/22/19   [provider]  cycloSPORINE (RESTASIS) 0.05 % ophthalmic emulsion Place 1 drop into the left eye daily.     [provider]  dexlansoprazole (DEXILANT) 60 MG  capsule Take 60 mg by mouth daily.    [provider]  diclofenac sodium (VOLTAREN) 1 % GEL Apply 2 g topically as needed (pain).     [provider]  JARDIANCE 25 MG TABS tablet Take 25 mg by mouth daily.    [provider]  Lancets (ONETOUCH DELICA PLUS [id number]) [place]  12/04/19   [provider]  lubiprostone (AMITIZA) 24 MCG capsule Take 24 mcg by mouth 2 (two) times daily with a meal. Patient not taking: Reported on 02/25/2022    [provider]  meclizine (ANTIVERT) 25 MG tablet Take 25 mg by mouth 2 (two) times daily.    [provider]  metoprolol tartrate (LOPRESSOR) 25 MG tablet TAKE 1 TABLET BY MOUTH TWICE A DAY 03/04/22   Lorretta Harp, MD  ONE TOUCH ULTRA TEST test strip  03/31/17   [provider]  rosuvastatin (CRESTOR) 20 MG tablet Take 20 mg by mouth daily.    [provider]  saxagliptin HCl (ONGLYZA) 5 MG TABS tablet Take 5 mg by mouth daily.    [provider]  tamsulosin (FLOMAX) 0.4 MG CAPS capsule Take 0.4 mg by mouth daily.     [provider]  tiZANidine (ZANAFLEX) 4 MG tablet Take 4 mg by mouth every 6 (six) hours as needed. 12/28/15   [provider]    Family History Family History  Problem Relation Age of Onset   Cancer Brother    Cancer Sister     Social History Social History   Tobacco Use   Smoking status: Former    Packs/day: 0.25    Types: Cigarettes    Quit date: 11/19/2018    Years since quitting: 3.4   Smokeless tobacco: Never   Tobacco comments:    smoke 3 to 4 cigarettes a day, 09/08/16 quit 3 wks ago  Vaping Use   Vaping Use: Never used  Substance Use Topics   Alcohol use: No    Alcohol/week: 0.0 standard drinks of alcohol    Comment: quit 25 years ago though before "drank so much that you couldn't tell b/t alcohol and wine"   Drug use: No     Allergies   Patient has no known allergies.   Review of Systems Review of Systems As per  HPI  Physical Exam Triage Vital Signs ED Triage Vitals  Enc Vitals Group     BP 05/15/22 1401 (!) 168/78     Pulse Rate 05/15/22 1401 69     Resp 05/15/22 1401 18     Temp 05/15/22 1401 98.3 F (36.8 C)     Temp Source 05/15/22 1401 Oral     SpO2 05/15/22 1401 97 %     Weight --      Height --      Head Circumference --      Peak Flow --  Pain Score 05/15/22 1358 5     Pain Loc --      Pain Edu? --      Excl. in [place]? --    No data found.  Updated Vital Signs BP (!) 168/78 (BP Location: Left Arm)   Pulse 69   Temp 98.3 F (36.8 C) (Oral)   Resp 18   SpO2 97%   Visual Acuity Right Eye Distance:   Left Eye Distance:   Bilateral Distance:    Right Eye Near:   Left Eye Near:    Bilateral Near:     Physical Exam Constitutional:      General: He is not in acute distress.    Appearance: He is not ill-appearing.  HENT:     Right Ear: Tympanic membrane normal.     Left Ear: Tympanic membrane normal.  Cardiovascular:     Rate and Rhythm: Normal rate and regular rhythm.     Pulses: Normal pulses.     Heart sounds: Normal heart sounds.  Pulmonary:     Effort: Pulmonary effort is normal.     Breath sounds: Normal breath sounds.  Abdominal:     General: Abdomen is flat.  Musculoskeletal:        General: Normal range of motion.  Neurological:     Mental Status: He is alert.      UC Treatments / Results  Labs (all labs ordered are listed, but only abnormal results are displayed) Labs Reviewed  CBG MONITORING, ED - Abnormal; Notable for the following components:      Result Value   Glucose-Capillary 173 (*)    All other components within normal limits    EKG   Radiology No results found.  Procedures Procedures (including critical care time)  Medications Ordered in UC Medications - No data to display  Initial Impression / Assessment and Plan / UC Course  I have reviewed the triage vital signs and the nursing notes.  Pertinent labs & imaging  results that were available during my care of the patient were reviewed by me and considered in my medical decision making (see chart for details).     1.  Slow transit constipation: Senokot nightly MiraLAX as needed for constipation Increase fruit and vegetable intake If patient develops worsening abdominal pain, distention, persistent nausea or vomiting-patient is advised to return to urgent care to be reevaluated  2.  Diabetes mellitus type 2, uncontrolled Point-of-care glucose is 173 Patient is advised to continue exercising Patient is advised to continue taking his Jardiance and saxagliptin Follow-up with primary care if his in 3 weeks. Final Clinical Impressions(s) / UC Diagnoses   Final diagnoses:  Slow transit constipation  Diabetes mellitus type 2 in obese [facility])     Discharge Instructions      Please increase fiber and fluid intake Increase oral fluid intake Your blood sugar was 171 Continue to exercise for at least 150 minutes every week Please return to urgent care if you have any other concerns.    ED Prescriptions     Medication Sig Dispense Auth. Provider   senna-docusate (SENOKOT-S) 8.6-50 MG tablet Take 1 tablet by mouth at bedtime. 30 tablet Aaron Boeh, Myrene Galas, MD   polyethylene glycol (MIRALAX) 17 g packet Take 17 g by mouth daily as needed for moderate constipation or mild constipation (if no bowel movement in 2 days). 14 each Rondle Lohse, Myrene Galas, MD      PDMP not reviewed this encounter.   Jaelah Hauth, Myrene Galas, MD  05/16/22 2158  

## 2022-12-06 ENCOUNTER — Other Ambulatory Visit: Payer: Self-pay | Admitting: Cardiovascular Disease

## 2023-03-30 ENCOUNTER — Other Ambulatory Visit (HOSPITAL_COMMUNITY): Payer: Self-pay | Admitting: Nurse Practitioner

## 2023-03-30 ENCOUNTER — Encounter (HOSPITAL_COMMUNITY): Payer: Self-pay | Admitting: Nurse Practitioner

## 2023-03-30 DIAGNOSIS — C61 Malignant neoplasm of prostate: Secondary | ICD-10-CM

## 2023-04-07 ENCOUNTER — Encounter (HOSPITAL_COMMUNITY)
Admission: RE | Admit: 2023-04-07 | Discharge: 2023-04-07 | Disposition: A | Discharge status: Home / Self Care | Payer: 59 | Source: Ambulatory Visit | Attending: Nurse Practitioner | Admitting: Nurse Practitioner

## 2023-04-07 DIAGNOSIS — C61 Malignant neoplasm of prostate: Secondary | ICD-10-CM | POA: Diagnosis present

## 2023-04-07 MED ORDER — FLOTUFOLASTAT F 18 GALLIUM 296-5846 MBQ/ML IV SOLN
7.7400 | Freq: Once | INTRAVENOUS | Status: AC
  Administered 2023-04-07: 7.74 via INTRAVENOUS
  Filled 2023-04-07: qty 8

## 2023-05-07 ENCOUNTER — Inpatient Hospital Stay (HOSPITAL_COMMUNITY)
Admission: EM | Admit: 2023-05-07 | Discharge: 2023-05-12 | DRG: 641 | Disposition: A | Discharge status: Home Health | Payer: 59 | Attending: Internal Medicine | Admitting: Internal Medicine

## 2023-05-07 ENCOUNTER — Encounter (HOSPITAL_COMMUNITY): Payer: Self-pay

## 2023-05-07 ENCOUNTER — Ambulatory Visit (HOSPITAL_COMMUNITY)
Admission: EM | Admit: 2023-05-07 | Discharge: 2023-05-07 | Disposition: A | Discharge status: Home / Self Care | Payer: 59 | Attending: Physician Assistant | Admitting: Physician Assistant

## 2023-05-07 DIAGNOSIS — Z79899 Other long term (current) drug therapy: Secondary | ICD-10-CM

## 2023-05-07 DIAGNOSIS — J101 Influenza due to other identified influenza virus with other respiratory manifestations: Principal | ICD-10-CM

## 2023-05-07 DIAGNOSIS — K5900 Constipation, unspecified: Secondary | ICD-10-CM | POA: Diagnosis not present

## 2023-05-07 DIAGNOSIS — E785 Hyperlipidemia, unspecified: Secondary | ICD-10-CM | POA: Diagnosis present

## 2023-05-07 DIAGNOSIS — R051 Acute cough: Secondary | ICD-10-CM | POA: Diagnosis not present

## 2023-05-07 DIAGNOSIS — E860 Dehydration: Principal | ICD-10-CM | POA: Diagnosis present

## 2023-05-07 DIAGNOSIS — R350 Frequency of micturition: Secondary | ICD-10-CM | POA: Diagnosis not present

## 2023-05-07 DIAGNOSIS — I10 Essential (primary) hypertension: Secondary | ICD-10-CM | POA: Diagnosis present

## 2023-05-07 DIAGNOSIS — D696 Thrombocytopenia, unspecified: Secondary | ICD-10-CM | POA: Insufficient documentation

## 2023-05-07 DIAGNOSIS — Z7984 Long term (current) use of oral hypoglycemic drugs: Secondary | ICD-10-CM

## 2023-05-07 DIAGNOSIS — W19XXXA Unspecified fall, initial encounter: Secondary | ICD-10-CM

## 2023-05-07 DIAGNOSIS — F0390 Unspecified dementia without behavioral disturbance: Secondary | ICD-10-CM | POA: Diagnosis present

## 2023-05-07 DIAGNOSIS — R296 Repeated falls: Secondary | ICD-10-CM | POA: Diagnosis present

## 2023-05-07 DIAGNOSIS — D649 Anemia, unspecified: Secondary | ICD-10-CM | POA: Insufficient documentation

## 2023-05-07 DIAGNOSIS — Z87891 Personal history of nicotine dependence: Secondary | ICD-10-CM

## 2023-05-07 DIAGNOSIS — Z8673 Personal history of transient ischemic attack (TIA), and cerebral infarction without residual deficits: Secondary | ICD-10-CM

## 2023-05-07 DIAGNOSIS — J1189 Influenza due to unidentified influenza virus with other manifestations: Secondary | ICD-10-CM | POA: Diagnosis present

## 2023-05-07 DIAGNOSIS — Z1152 Encounter for screening for COVID-19: Secondary | ICD-10-CM

## 2023-05-07 DIAGNOSIS — Z8546 Personal history of malignant neoplasm of prostate: Secondary | ICD-10-CM

## 2023-05-07 DIAGNOSIS — Y92009 Unspecified place in unspecified non-institutional (private) residence as the place of occurrence of the external cause: Secondary | ICD-10-CM

## 2023-05-07 DIAGNOSIS — Z87440 Personal history of urinary (tract) infections: Secondary | ICD-10-CM

## 2023-05-07 DIAGNOSIS — E119 Type 2 diabetes mellitus without complications: Secondary | ICD-10-CM

## 2023-05-07 DIAGNOSIS — N400 Enlarged prostate without lower urinary tract symptoms: Secondary | ICD-10-CM | POA: Insufficient documentation

## 2023-05-07 DIAGNOSIS — Z7982 Long term (current) use of aspirin: Secondary | ICD-10-CM

## 2023-05-07 DIAGNOSIS — J449 Chronic obstructive pulmonary disease, unspecified: Secondary | ICD-10-CM | POA: Diagnosis present

## 2023-05-07 DIAGNOSIS — R531 Weakness: Secondary | ICD-10-CM

## 2023-05-07 DIAGNOSIS — Z9181 History of falling: Secondary | ICD-10-CM

## 2023-05-07 HISTORY — DX: Chronic obstructive pulmonary disease, unspecified: J44.9

## 2023-05-07 HISTORY — DX: History of falling: Z91.81

## 2023-05-07 HISTORY — DX: Unspecified dementia, unspecified severity, without behavioral disturbance, psychotic disturbance, mood disturbance, and anxiety: F03.90

## 2023-05-07 LAB — POCT URINALYSIS DIP (MANUAL ENTRY)
Bilirubin, UA: NEGATIVE
Blood, UA: NEGATIVE
Glucose, UA: 500 mg/dL — AB
Ketones, POC UA: NEGATIVE mg/dL
Leukocytes, UA: NEGATIVE
Nitrite, UA: NEGATIVE
Protein Ur, POC: 100 mg/dL — AB
Spec Grav, UA: 1.015
Urobilinogen, UA: 1 U/dL
pH, UA: 6

## 2023-05-07 LAB — POCT FASTING CBG KUC MANUAL ENTRY: POCT Glucose (KUC): 103 mg/dL — AB (ref 70–99)

## 2023-05-07 NOTE — ED Provider Notes (Signed)
 [facility]    CSN: [id number] Arrival date & time: 05/07/23  4098      History   Chief Complaint Chief Complaint  Patient presents with   Cough    HPI Peter Mann is a 83 y.o. male.   HPI  Patient appears to be unreliable historian and is not accompanied by another adult or guardian.  Discussed the use of AI scribe software for clinical note transcription with the patient, who gave verbal consent to proceed.  The patient presents with constipation and urinary symptoms. They are accompanied by their sister-in-law, who is not currently present. They experience constipation, describing it as a 'locked bowel' and difficulty with bowel movements. No specific timeline for the onset of these symptoms was provided. They mention urinary symptoms, including a history of urinary tract infection for which they were prescribed an antibiotic. They completed a course of ten pills, taken twice daily, and report that the urinary symptoms have resolved. No current dysuria or hematuria, but they mention occasional urinary incontinence. They have a history of high blood pressure and diabetes. They report taking their blood pressure medications today but are unsure of the exact time. They mention taking multiple medications and express some confusion about their medication regimen. No current cough, difficulty breathing, or fever. They mention having had a cough previously but state it has resolved. They also report occasional stomach pain, which occurs sometimes at night.   Patient did provide a bottle that is empty of Keflex.  This is presumably to treat UTI.  He reports that he did take twice per day as directed.   Past Medical History:  Diagnosis Date   Cancer (HCC)    prostrate ca, radiation treatments   Chest pain    Diabetes mellitus without complication (HCC)    Dizziness    History of tobacco abuse    Hyperlipidemia    Hypertension    Stroke [facility])    Subconjunctival  hemorrhage of left eye 07/18/2019   The condition of the involved eye is that of a subconjunctival hemorrhage.  These are often spontaneous but are also often at or near the site of low location of an injection should to be placed into the eye.  In the absence of direct blunt or severe trauma these will typically resolve on their own spontaneously and have no impact on the vision.    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 07/11/2019   Posterior vitreous detachment of left eye 07/11/2019   Smoker 03/17/2016   Facial palsy    Acute CVA (cerebrovascular accident) (HCC)    HLD (hyperlipidemia)    Diabetes mellitus type 2, noninsulin dependent (HCC)    CVA (cerebral vascular accident) (HCC) 08/23/2015   Abdominal distention    Gangrenous cholecystitis    Ileus (HCC)    Abdominal pain 01/28/2014   Cholelithiasis 01/28/2014   Cholecystitis 01/28/2014   Symptomatic cholelithiasis 01/28/2014   Hyperglycemia 01/28/2014   Chest pain 02/01/2013   Essential hypertension 02/01/2013   Hyperlipidemia 02/01/2013    Past  Surgical History:  Procedure Laterality Date   CHOLECYSTECTOMY N/A 01/30/2014   Procedure: LAPAROSCOPIC CHOLECYSTECTOMY;  Surgeon: Axel Filler, MD;  Location: [facility] OR;  Service: General;  Laterality: N/A;       Home Medications    Prior to Admission medications   Medication Sig Start Date End Date Taking? Authorizing Provider  acetaminophen (TYLENOL) 500 MG tablet Take 1,000 mg by mouth every 6 (six) hours  as needed for mild pain or headache.    [provider]  amLODipine (NORVASC) 10 MG tablet Take 10 mg by mouth daily.    [provider]  aspirin 325 MG tablet Take 1 tablet (325 mg total) by mouth daily. 08/27/15   Albertine Grates, MD  chlorhexidine (PERIDEX) 0.12 % solution SMARTSIG:15 Milliliter(s) By Mouth Every Morning-Evening 09/22/19   [provider]  cycloSPORINE (RESTASIS) 0.05 % ophthalmic emulsion Place 1 drop into the left eye daily.     [provider]  dexlansoprazole (DEXILANT) 60 MG capsule Take 60 mg by mouth daily.    [provider]  diclofenac sodium (VOLTAREN) 1 % GEL Apply 2 g topically as needed (pain).     [provider]  JARDIANCE 25 MG TABS tablet Take 25 mg by mouth daily.    [provider]  Lancets [facility] DELICA PLUS [place]) MISC  12/04/19   [provider]  lubiprostone (AMITIZA) 24 MCG capsule Take 24 mcg by mouth 2 (two) times daily with a meal. Patient not taking: Reported on 02/25/2022    [provider]  meclizine (ANTIVERT) 25 MG tablet Take 25 mg by mouth 2 (two) times daily.    [provider]  metoprolol tartrate (LOPRESSOR) 25 MG tablet TAKE 1 TABLET BY MOUTH TWICE A DAY 12/08/22   Runell Gess, MD  ONE TOUCH ULTRA TEST test strip  03/31/17   [provider]  polyethylene glycol (MIRALAX) 17 g packet Take 17 g by mouth daily as needed for moderate constipation or mild constipation (if no bowel movement in 2 days). 05/15/22   Merrilee Jansky, MD  rosuvastatin (CRESTOR) 20 MG tablet Take 20 mg by mouth daily.    [provider]  saxagliptin HCl (ONGLYZA) 5 MG TABS tablet Take 5 mg by mouth daily.    [provider]  senna-docusate (SENOKOT-S) 8.6-50 MG tablet Take 1 tablet by mouth at bedtime. 05/15/22   Merrilee Jansky, MD  tamsulosin (FLOMAX) 0.4 MG CAPS capsule Take 0.4 mg by mouth daily.     [provider]  tiZANidine (ZANAFLEX) 4  MG tablet Take 4 mg by mouth every 6 (six) hours as needed. 12/28/15   [provider]    Family History Family History  Problem Relation Age of Onset   Cancer Brother    Cancer Sister     Social History Social History   Tobacco Use   Smoking status: Former    Current packs/day: 0.00    Types: Cigarettes    Quit date: 11/19/2018    Years since quitting: 4.4   Smokeless tobacco: Never   Tobacco comments:    smoke 3 to 4 cigarettes a day, 09/08/16 quit 3 wks ago  Vaping Use   Vaping status: Never Used  Substance Use Topics   Alcohol use: No    Alcohol/week: 0.0 standard drinks of alcohol    Comment: quit 25 years ago though before "drank so much that you couldn't tell b/t alcohol and wine"   Drug use: No  Allergies   Patient has no known allergies.   Review of Systems Review of Systems  Respiratory:  Positive for cough.      Physical Exam Triage Vital Signs ED Triage Vitals  Encounter Vitals Group     BP 05/07/23 1015 (!) 170/100     Systolic BP Percentile --      Diastolic BP Percentile --      Pulse Rate 05/07/23 1014 84     Resp 05/07/23 1014 20     Temp 05/07/23 1014 98.4 F (36.9 C)     Temp Source 05/07/23 1014 Oral     SpO2 05/07/23 1014 94 %     Weight --      Height --      Head Circumference --      Peak Flow --      Pain Score 05/07/23 1015 10     Pain Loc --      Pain Education --      Exclude from Growth Chart --    No data found.  Updated Vital Signs BP (!) 170/100 (BP Location: Left Arm) Comment: states did not take b/p meds today  Pulse 84   Temp 98.4 F (36.9 C) (Oral)   Resp 20   SpO2 94%   Visual Acuity Right Eye Distance:   Left Eye Distance:   Bilateral Distance:    Right Eye Near:   Left Eye Near:    Bilateral Near:     Physical Exam Vitals reviewed.  Constitutional:      General: He is awake. He is not in acute distress.    Appearance: Normal appearance. He is well-developed. He is not ill-appearing  or toxic-appearing.     Comments: Patient does not appear in acute distress and appears comfortable sitting in the room.  No notable issues with removing large jacket to allow for lung exam.  Range of motion appears intact and symmetrical bilaterally.  HENT:     Head: Normocephalic and atraumatic.     Right Ear: Hearing, tympanic membrane and ear canal normal.     Left Ear: Hearing, tympanic membrane and ear canal normal.  Cardiovascular:     Rate and Rhythm: Normal rate and regular rhythm.     Heart sounds: Normal heart sounds. No murmur heard.    No friction rub. No gallop.  Pulmonary:     Effort: Pulmonary effort is normal.     Breath sounds: Normal breath sounds. No decreased air movement. No decreased breath sounds, wheezing, rhonchi or rales.  Musculoskeletal:     Cervical back: Normal range of motion.  Neurological:     Mental Status: He is alert.     GCS: GCS eye subscore is 4. GCS verbal subscore is 5. GCS motor subscore is 6.     Cranial Nerves: No cranial nerve deficit, dysarthria or facial asymmetry.     Motor: No weakness or tremor.     Gait: Gait is intact.  Psychiatric:        Mood and Affect: Mood normal.        Speech: Speech is rapid and pressured.        Behavior: Behavior normal. Behavior is cooperative.        Cognition and Memory: Cognition is impaired. Memory is impaired.      UC Treatments / Results  Labs (all labs ordered are listed, but only abnormal results are displayed) Labs Reviewed  POCT URINALYSIS DIP (MANUAL ENTRY) - Abnormal; Notable for  the following components:      Result Value   Color, UA light yellow (*)    Glucose, UA =500 (*)    Protein Ur, POC =100 (*)    All other components within normal limits    EKG   Radiology No results found.  Procedures Procedures (including critical care time)  Medications Ordered in UC Medications - No data to display  Initial Impression / Assessment and Plan / UC Course  I have reviewed the  triage vital signs and the nursing notes.  Pertinent labs & imaging results that were available during my care of the patient were reviewed by me and considered in my medical decision making (see chart for details).      Final Clinical Impressions(s) / UC Diagnoses   Final diagnoses:  Acute cough  Constipation, unspecified constipation type  Urinary frequency    Constipation  Reports difficulty with bowel movements, described as 'locked bowel'. No associated pain or blood in stool. Discussed dietary and lifestyle changes, including increased fiber intake and hydration. Explained potential benefits and risks of stool softeners and laxatives if dietary changes are insufficient. - Provide dietary and lifestyle advice - Consider prescribing a stool softener or laxative if dietary changes are insufficient  Urinary Tract Infection (UTI)  Previously treated with antibiotics. No current symptoms but requests confirmation of infection resolution. Discussed importance of completing antibiotic courses and potential for recurrence if not fully resolved. - Order urinalysis to confirm resolution.  Urine dip was negative for signs of acute UTI.  There was elevated glucose present but I suspect this is likely secondary to his Jardiance use.  Recommend follow-up with primary care provider if symptoms appear to be recurrent. Hypertension  Blood pressure elevated during visit. Uncertainty about timing and adherence to medication regimen. Emphasized importance of medication adherence and regular monitoring. Discussed potential risks of uncontrolled hypertension, including stroke and heart disease. - Reinforce medication adherence - Recommend follow-up with primary care physician for blood pressure management  Diabetes Mellitus  No acute symptoms reported. Uncertainty about current blood sugar levels. Discussed importance of regular blood sugar monitoring and A1c testing. Explained potential complications of  uncontrolled diabetes, including neuropathy and cardiovascular disease. - Check blood sugar with a finger prick  Finger prick testing was 103.  He does not report symptoms consistent with hypoglycemia at this time. - Recommend follow-up with primary care physician for diabetes management and A1c testing.   Cough  Recent history of cough, now resolved. No associated symptoms such as fever, chills, or difficulty breathing. Advised to monitor for recurrence and seek medical attention if symptoms return or new symptoms develop. - Monitor for recurrence of symptoms - Advise to seek medical attention if cough returns or if new symptoms develop  General Health Maintenance  Multiple chronic conditions requiring ongoing management. Emphasized importance of follow-up with primary care physician for comprehensive care. Advised to have someone accompany them to future appointments for support. - Encourage regular follow-up with primary care physician for management of hypertension and diabetes - Advise to have someone accompany them to future appointments for support   Follow-up - Follow up with primary care physician for hypertension and diabetes management - Return to clinic for urinalysis results.    Discharge Instructions      For your next appointment or visit to the urgent care I do recommend that you bring a family member or trusted friend with you to help discuss your concerns as well as make sure that you  are getting a care plan that you are able to complete.  VISIT SUMMARY:  During today's visit, we addressed your concerns about constipation and urinary symptoms, as well as reviewed your ongoing management of high blood pressure and diabetes. We also discussed your recent history of cough and general health maintenance.  YOUR PLAN:  -CONSTIPATION: Constipation means having difficulty with bowel movements. We discussed increasing your fiber intake and staying hydrated. If these changes are not  enough, we may consider prescribing a stool softener or laxative.  -URINARY TRACT INFECTION (UTI): A urinary tract infection is an infection in any part of your urinary system. You have completed your antibiotic course, and we will order a urinalysis to confirm that the infection has resolved.  Your urine testing did not reveal signs of an ongoing urinary tract infection.  You did have some glucose in your urine but I suspect that this is likely secondary to your diabetes and some of the medications that you are taking.  Please follow-up with your primary care provider if you think that this is becoming a concern or recurrent.  -HYPERTENSION: Hypertension, or high blood pressure, can lead to serious health problems if not managed properly. It is important to take your medications as prescribed and monitor your blood pressure regularly. Please follow up with your primary care physician for ongoing management.  -DIABETES MELLITUS: Diabetes is a condition that affects your blood sugar levels. Regular monitoring of your blood sugar and A1c testing is crucial to prevent complications. Please follow up with your primary care physician for further management.  -COUGH: You recently had a cough that has now resolved. Please monitor for any recurrence of symptoms and seek medical attention if the cough returns or if you develop new symptoms.  -GENERAL HEALTH MAINTENANCE: It is important to have regular follow-ups with your primary care physician to manage your chronic conditions. Bringing someone with you to your appointments can provide additional support.  INSTRUCTIONS:  Please follow up with your primary care physician for the management of your hypertension and diabetes. Additionally, return to the clinic for your urinalysis results.     ED Prescriptions   None    PDMP not reviewed this encounter.   Providence Crosby, PA-C 05/07/23 1109

## 2023-05-07 NOTE — Discharge Instructions (Addendum)
 For your next appointment or visit to the urgent care I do recommend that you bring a family member or trusted friend with you to help discuss your concerns as well as make sure that you are getting a care plan that you are able to complete.  VISIT SUMMARY:  During today's visit, we addressed your concerns about constipation and urinary symptoms, as well as reviewed your ongoing management of high blood pressure and diabetes. We also discussed your recent history of cough and general health maintenance.  YOUR PLAN:  -CONSTIPATION: Constipation means having difficulty with bowel movements. We discussed increasing your fiber intake and staying hydrated. If these changes are not enough, we may consider prescribing a stool softener or laxative.  -URINARY TRACT INFECTION (UTI): A urinary tract infection is an infection in any part of your urinary system. You have completed your antibiotic course, and we will order a urinalysis to confirm that the infection has resolved.  Your urine testing did not reveal signs of an ongoing urinary tract infection.  You did have some glucose in your urine but I suspect that this is likely secondary to your diabetes and some of the medications that you are taking.  Please follow-up with your primary care provider if you think that this is becoming a concern or recurrent.  -HYPERTENSION: Hypertension, or high blood pressure, can lead to serious health problems if not managed properly. It is important to take your medications as prescribed and monitor your blood pressure regularly. Please follow up with your primary care physician for ongoing management.  -DIABETES MELLITUS: Diabetes is a condition that affects your blood sugar levels. Regular monitoring of your blood sugar and A1c testing is crucial to prevent complications. Please follow up with your primary care physician for further management.  -COUGH: You recently had a cough that has now resolved. Please monitor for  any recurrence of symptoms and seek medical attention if the cough returns or if you develop new symptoms.  -GENERAL HEALTH MAINTENANCE: It is important to have regular follow-ups with your primary care physician to manage your chronic conditions. Bringing someone with you to your appointments can provide additional support.  INSTRUCTIONS:  Please follow up with your primary care physician for the management of your hypertension and diabetes. Additionally, return to the clinic for your urinalysis results.

## 2023-05-07 NOTE — ED Triage Notes (Signed)
 Pt c/o bad cough x1wk. C/o leg/feet/knee pain for 3wks. Denies injury.

## 2023-05-07 NOTE — ED Triage Notes (Signed)
 Pt arrived from home BIB [facility] for a non-productive cough x3 week, also c/o leg pain and weakness for 3 weeks, was seen at [facility] today for same and dx with a continued "cough" as he as already been on Cipro for the cough. No f/u CXR or COVID/Flu swab done. Pt also fell twice today, not on thinners. EMS found pt on floor, soaked in urine, pt stated he "rolled out of bed". Pt also reports nasal congestions. Afebrile. A&O x3 at baseline.

## 2023-05-08 ENCOUNTER — Other Ambulatory Visit: Payer: Self-pay

## 2023-05-08 ENCOUNTER — Encounter (HOSPITAL_COMMUNITY): Payer: Self-pay

## 2023-05-08 ENCOUNTER — Observation Stay (HOSPITAL_COMMUNITY): Payer: 59

## 2023-05-08 ENCOUNTER — Emergency Department (HOSPITAL_COMMUNITY): Payer: 59

## 2023-05-08 ENCOUNTER — Inpatient Hospital Stay (HOSPITAL_COMMUNITY): Payer: 59

## 2023-05-08 DIAGNOSIS — E785 Hyperlipidemia, unspecified: Secondary | ICD-10-CM | POA: Diagnosis present

## 2023-05-08 DIAGNOSIS — I10 Essential (primary) hypertension: Secondary | ICD-10-CM

## 2023-05-08 DIAGNOSIS — Z9181 History of falling: Secondary | ICD-10-CM | POA: Diagnosis not present

## 2023-05-08 DIAGNOSIS — E119 Type 2 diabetes mellitus without complications: Secondary | ICD-10-CM

## 2023-05-08 DIAGNOSIS — Z7982 Long term (current) use of aspirin: Secondary | ICD-10-CM | POA: Diagnosis not present

## 2023-05-08 DIAGNOSIS — R296 Repeated falls: Secondary | ICD-10-CM | POA: Diagnosis present

## 2023-05-08 DIAGNOSIS — R531 Weakness: Secondary | ICD-10-CM | POA: Diagnosis present

## 2023-05-08 DIAGNOSIS — K5900 Constipation, unspecified: Secondary | ICD-10-CM

## 2023-05-08 DIAGNOSIS — N400 Enlarged prostate without lower urinary tract symptoms: Secondary | ICD-10-CM | POA: Diagnosis present

## 2023-05-08 DIAGNOSIS — Z1152 Encounter for screening for COVID-19: Secondary | ICD-10-CM | POA: Diagnosis not present

## 2023-05-08 DIAGNOSIS — Z8673 Personal history of transient ischemic attack (TIA), and cerebral infarction without residual deficits: Secondary | ICD-10-CM | POA: Diagnosis not present

## 2023-05-08 DIAGNOSIS — J449 Chronic obstructive pulmonary disease, unspecified: Secondary | ICD-10-CM | POA: Diagnosis present

## 2023-05-08 DIAGNOSIS — Z87440 Personal history of urinary (tract) infections: Secondary | ICD-10-CM | POA: Diagnosis not present

## 2023-05-08 DIAGNOSIS — M79673 Pain in unspecified foot: Secondary | ICD-10-CM | POA: Diagnosis not present

## 2023-05-08 DIAGNOSIS — Z7984 Long term (current) use of oral hypoglycemic drugs: Secondary | ICD-10-CM | POA: Diagnosis not present

## 2023-05-08 DIAGNOSIS — Y92009 Unspecified place in unspecified non-institutional (private) residence as the place of occurrence of the external cause: Secondary | ICD-10-CM

## 2023-05-08 DIAGNOSIS — Z87891 Personal history of nicotine dependence: Secondary | ICD-10-CM | POA: Diagnosis not present

## 2023-05-08 DIAGNOSIS — J101 Influenza due to other identified influenza virus with other respiratory manifestations: Principal | ICD-10-CM

## 2023-05-08 DIAGNOSIS — Z8546 Personal history of malignant neoplasm of prostate: Secondary | ICD-10-CM | POA: Diagnosis not present

## 2023-05-08 DIAGNOSIS — D696 Thrombocytopenia, unspecified: Secondary | ICD-10-CM | POA: Diagnosis present

## 2023-05-08 DIAGNOSIS — W19XXXA Unspecified fall, initial encounter: Secondary | ICD-10-CM | POA: Diagnosis present

## 2023-05-08 DIAGNOSIS — J1189 Influenza due to unidentified influenza virus with other manifestations: Secondary | ICD-10-CM | POA: Diagnosis present

## 2023-05-08 DIAGNOSIS — Z79899 Other long term (current) drug therapy: Secondary | ICD-10-CM | POA: Diagnosis not present

## 2023-05-08 DIAGNOSIS — F0390 Unspecified dementia without behavioral disturbance: Secondary | ICD-10-CM | POA: Diagnosis present

## 2023-05-08 DIAGNOSIS — D649 Anemia, unspecified: Secondary | ICD-10-CM | POA: Diagnosis present

## 2023-05-08 DIAGNOSIS — E860 Dehydration: Secondary | ICD-10-CM | POA: Diagnosis present

## 2023-05-08 LAB — COMPREHENSIVE METABOLIC PANEL
ALT: 15 U/L (ref 0–44)
ALT: 18 U/L (ref 0–44)
AST: 59 U/L — ABNORMAL HIGH (ref 15–41)
AST: 81 U/L — ABNORMAL HIGH (ref 15–41)
Albumin: 3.4 g/dL — ABNORMAL LOW (ref 3.5–5.0)
Albumin: 3.2 g/dL — ABNORMAL LOW (ref 3.5–5.0)
Alkaline Phosphatase: 55 U/L (ref 38–126)
Alkaline Phosphatase: 47 U/L (ref 38–126)
Anion gap: 13 (ref 5–15)
Anion gap: 10 (ref 5–15)
BUN: 18 mg/dL (ref 8–23)
BUN: 18 mg/dL (ref 8–23)
CO2: 23 mmol/L (ref 22–32)
CO2: 23 mmol/L (ref 22–32)
Calcium: 9 mg/dL (ref 8.9–10.3)
Calcium: 8.8 mg/dL — ABNORMAL LOW (ref 8.9–10.3)
Chloride: 105 mmol/L (ref 98–111)
Chloride: 108 mmol/L (ref 98–111)
Creatinine, Ser: 1.3 mg/dL — ABNORMAL HIGH (ref 0.61–1.24)
Creatinine, Ser: 1.05 mg/dL (ref 0.61–1.24)
GFR, Estimated: 54 mL/min — ABNORMAL LOW (ref >60)
GFR, Estimated: >60 mL/min (ref >60)
Glucose, Bld: 120 mg/dL — ABNORMAL HIGH (ref 70–99)
Glucose, Bld: 95 mg/dL (ref 70–99)
Potassium: 3.5 mmol/L (ref 3.5–5.1)
Potassium: 3.2 mmol/L — ABNORMAL LOW (ref 3.5–5.1)
Sodium: 141 mmol/L (ref 135–145)
Sodium: 141 mmol/L (ref 135–145)
Total Bilirubin: 0.6 mg/dL (ref 0.0–1.2)
Total Bilirubin: 0.7 mg/dL (ref 0.0–1.2)
Total Protein: 6.8 g/dL (ref 6.5–8.1)
Total Protein: 6.5 g/dL (ref 6.5–8.1)

## 2023-05-08 LAB — FOLATE: Folate: 15.3 ng/mL (ref >5.9)

## 2023-05-08 LAB — CBC WITH DIFFERENTIAL/PLATELET
Abs Immature Granulocytes: 0.04 10*3/uL (ref 0.00–0.07)
Abs Immature Granulocytes: 0.03 10*3/uL (ref 0.00–0.07)
Basophils Absolute: 0 10*3/uL (ref 0.0–0.1)
Basophils Absolute: 0 10*3/uL (ref 0.0–0.1)
Basophils Relative: 0 %
Basophils Relative: 0 %
Eosinophils Absolute: 0 10*3/uL (ref 0.0–0.5)
Eosinophils Absolute: 0 10*3/uL (ref 0.0–0.5)
Eosinophils Relative: 0 %
Eosinophils Relative: 0 %
HCT: 32.8 % — ABNORMAL LOW (ref 39.0–52.0)
HCT: 34 % — ABNORMAL LOW (ref 39.0–52.0)
Hemoglobin: 10.2 g/dL — ABNORMAL LOW (ref 13.0–17.0)
Hemoglobin: 10.7 g/dL — ABNORMAL LOW (ref 13.0–17.0)
Immature Granulocytes: 1 %
Immature Granulocytes: 0 %
Lymphocytes Relative: 6 %
Lymphocytes Relative: 9 %
Lymphs Abs: 0.4 10*3/uL — ABNORMAL LOW (ref 0.7–4.0)
Lymphs Abs: 0.6 10*3/uL — ABNORMAL LOW (ref 0.7–4.0)
MCH: 26.5 pg (ref 26.0–34.0)
MCH: 26.4 pg (ref 26.0–34.0)
MCHC: 31.1 g/dL (ref 30.0–36.0)
MCHC: 31.5 g/dL (ref 30.0–36.0)
MCV: 85.2 fL (ref 80.0–100.0)
MCV: 83.7 fL (ref 80.0–100.0)
Monocytes Absolute: 0.5 10*3/uL (ref 0.1–1.0)
Monocytes Absolute: 0.6 10*3/uL (ref 0.1–1.0)
Monocytes Relative: 9 %
Monocytes Relative: 9 %
Neutro Abs: 5.3 10*3/uL (ref 1.7–7.7)
Neutro Abs: 5.6 10*3/uL (ref 1.7–7.7)
Neutrophils Relative %: 84 %
Neutrophils Relative %: 82 %
Platelets: 134 10*3/uL — ABNORMAL LOW (ref 150–400)
Platelets: 213 10*3/uL (ref 150–400)
RBC: 3.85 MIL/uL — ABNORMAL LOW (ref 4.22–5.81)
RBC: 4.06 MIL/uL — ABNORMAL LOW (ref 4.22–5.81)
RDW: 15.7 % — ABNORMAL HIGH (ref 11.5–15.5)
RDW: 15.8 % — ABNORMAL HIGH (ref 11.5–15.5)
WBC: 6.3 10*3/uL (ref 4.0–10.5)
WBC: 6.8 10*3/uL (ref 4.0–10.5)
nRBC: 0 % (ref 0.0–0.2)
nRBC: 0 % (ref 0.0–0.2)

## 2023-05-08 LAB — CK: Total CK: 5705 U/L — ABNORMAL HIGH (ref 49–397)

## 2023-05-08 LAB — RESP PANEL BY RT-PCR (RSV, FLU A&B, COVID)  RVPGX2
Influenza A by PCR: POSITIVE — AB
Influenza B by PCR: NEGATIVE
Resp Syncytial Virus by PCR: NEGATIVE
SARS Coronavirus 2 by RT PCR: NEGATIVE

## 2023-05-08 LAB — URINALYSIS, ROUTINE W REFLEX MICROSCOPIC
Bacteria, UA: NONE SEEN
Bilirubin Urine: NEGATIVE
Glucose, UA: >=500 mg/dL — AB
Ketones, ur: NEGATIVE mg/dL
Leukocytes,Ua: NEGATIVE
Nitrite: NEGATIVE
Protein, ur: 100 mg/dL — AB
Specific Gravity, Urine: 1.018 (ref 1.005–1.030)
pH: 6 (ref 5.0–8.0)

## 2023-05-08 LAB — CBG MONITORING, ED
Glucose-Capillary: 123 mg/dL — ABNORMAL HIGH (ref 70–99)
Glucose-Capillary: 68 mg/dL — ABNORMAL LOW (ref 70–99)
Glucose-Capillary: 99 mg/dL (ref 70–99)

## 2023-05-08 LAB — IRON AND TIBC
Iron: 15 ug/dL — ABNORMAL LOW (ref 45–182)
Saturation Ratios: 5 % — ABNORMAL LOW (ref 17.9–39.5)
TIBC: 316 ug/dL (ref 250–450)
UIBC: 301 ug/dL

## 2023-05-08 LAB — TSH: TSH: 1.213 u[IU]/mL (ref 0.350–4.500)

## 2023-05-08 LAB — RETICULOCYTES
Immature Retic Fract: 12.9 % (ref 2.3–15.9)
RBC.: 4.03 MIL/uL — ABNORMAL LOW (ref 4.22–5.81)
Retic Count, Absolute: 39.1 10*3/uL (ref 19.0–186.0)
Retic Ct Pct: 1 % (ref 0.4–3.1)

## 2023-05-08 LAB — VITAMIN B12: Vitamin B-12: 109 pg/mL — ABNORMAL LOW (ref 180–914)

## 2023-05-08 LAB — MAGNESIUM: Magnesium: 1.8 mg/dL (ref 1.7–2.4)

## 2023-05-08 LAB — GLUCOSE, CAPILLARY
Glucose-Capillary: 137 mg/dL — ABNORMAL HIGH (ref 70–99)
Glucose-Capillary: 115 mg/dL — ABNORMAL HIGH (ref 70–99)

## 2023-05-08 LAB — FERRITIN: Ferritin: 64 ng/mL (ref 24–336)

## 2023-05-08 LAB — HEMOGLOBIN A1C
Hgb A1c MFr Bld: 8.2 % — ABNORMAL HIGH (ref 4.8–5.6)
Mean Plasma Glucose: 188.64 mg/dL

## 2023-05-08 MED ORDER — ASPIRIN 325 MG PO TABS
325.0000 mg | ORAL_TABLET | Freq: Every day | ORAL | Status: DC
  Administered 2023-05-08 – 2023-05-12 (×5): 325 mg via ORAL
  Filled 2023-05-08 (×5): qty 1

## 2023-05-08 MED ORDER — LACTULOSE 10 GM/15ML PO SOLN: 30.0000 g | Freq: Two times a day (BID) | ORAL | Status: DC | PRN

## 2023-05-08 MED ORDER — METOPROLOL TARTRATE 25 MG PO TABS
25.0000 mg | ORAL_TABLET | Freq: Two times a day (BID) | ORAL | Status: DC
  Administered 2023-05-08 – 2023-05-11 (×7): 25 mg via ORAL
  Filled 2023-05-08 (×8): qty 1

## 2023-05-08 MED ORDER — CYCLOSPORINE 0.05 % OP EMUL
1.0000 [drp] | Freq: Every day | OPHTHALMIC | Status: DC
  Administered 2023-05-09 – 2023-05-12 (×4): 1 [drp] via OPHTHALMIC
  Filled 2023-05-08 (×4): qty 30

## 2023-05-08 MED ORDER — SODIUM CHLORIDE 0.9 % IV BOLUS
1000.0000 mL | Freq: Once | INTRAVENOUS | Status: AC
  Administered 2023-05-08: 1000 mL via INTRAVENOUS

## 2023-05-08 MED ORDER — LINACLOTIDE 145 MCG PO CAPS
290.0000 ug | ORAL_CAPSULE | Freq: Every day | ORAL | Status: DC
  Administered 2023-05-08 – 2023-05-12 (×5): 290 ug via ORAL
  Filled 2023-05-08 (×5): qty 2

## 2023-05-08 MED ORDER — ACETAMINOPHEN 500 MG PO TABS
1000.0000 mg | ORAL_TABLET | Freq: Four times a day (QID) | ORAL | Status: DC | PRN
  Administered 2023-05-08: 1000 mg via ORAL
  Filled 2023-05-08: qty 2

## 2023-05-08 MED ORDER — PANTOPRAZOLE SODIUM 40 MG PO TBEC
40.0000 mg | DELAYED_RELEASE_TABLET | Freq: Every day | ORAL | Status: DC
  Administered 2023-05-08 – 2023-05-12 (×5): 40 mg via ORAL
  Filled 2023-05-08 (×5): qty 1

## 2023-05-08 MED ORDER — TAMSULOSIN HCL 0.4 MG PO CAPS
0.4000 mg | ORAL_CAPSULE | Freq: Every day | ORAL | Status: DC
  Administered 2023-05-08 – 2023-05-12 (×5): 0.4 mg via ORAL
  Filled 2023-05-08 (×5): qty 1

## 2023-05-08 MED ORDER — SENNOSIDES-DOCUSATE SODIUM 8.6-50 MG PO TABS
1.0000 | ORAL_TABLET | Freq: Two times a day (BID) | ORAL | Status: DC
  Administered 2023-05-08 – 2023-05-12 (×6): 1 via ORAL
  Filled 2023-05-08 (×7): qty 1

## 2023-05-08 MED ORDER — POLYETHYLENE GLYCOL 3350 17 G PO PACK
17.0000 g | PACK | Freq: Two times a day (BID) | ORAL | Status: DC
  Administered 2023-05-08 – 2023-05-09 (×3): 17 g via ORAL
  Filled 2023-05-08 (×4): qty 1

## 2023-05-08 MED ORDER — INSULIN ASPART 100 UNIT/ML IJ SOLN
0.0000 [IU] | Freq: Three times a day (TID) | INTRAMUSCULAR | Status: DC
  Administered 2023-05-08 – 2023-05-11 (×6): 1 [IU] via SUBCUTANEOUS
  Administered 2023-05-11 (×2): 2 [IU] via SUBCUTANEOUS

## 2023-05-08 MED ORDER — LACTATED RINGERS IV SOLN: INTRAVENOUS | Status: DC

## 2023-05-08 MED ORDER — AMLODIPINE BESYLATE 10 MG PO TABS
10.0000 mg | ORAL_TABLET | Freq: Every day | ORAL | Status: DC
  Administered 2023-05-08 – 2023-05-12 (×5): 10 mg via ORAL
  Filled 2023-05-08 (×2): qty 1
  Filled 2023-05-08: qty 2
  Filled 2023-05-08 (×2): qty 1

## 2023-05-08 MED ORDER — ENOXAPARIN SODIUM 40 MG/0.4ML IJ SOSY
40.0000 mg | PREFILLED_SYRINGE | INTRAMUSCULAR | Status: DC
  Administered 2023-05-08 – 2023-05-11 (×4): 40 mg via SUBCUTANEOUS
  Filled 2023-05-08 (×4): qty 0.4

## 2023-05-08 MED ORDER — SENNOSIDES-DOCUSATE SODIUM 8.6-50 MG PO TABS: 1.0000 | ORAL_TABLET | Freq: Every day | ORAL | Status: DC

## 2023-05-08 MED ORDER — POLYETHYLENE GLYCOL 3350 17 G PO PACK: 17.0000 g | PACK | Freq: Every day | ORAL | Status: DC | PRN

## 2023-05-08 MED ORDER — ROSUVASTATIN CALCIUM 20 MG PO TABS
20.0000 mg | ORAL_TABLET | Freq: Every day | ORAL | Status: DC
  Administered 2023-05-08 – 2023-05-12 (×5): 20 mg via ORAL
  Filled 2023-05-08 (×5): qty 1

## 2023-05-08 NOTE — ED Notes (Signed)
 Patient observed resting quietly in bed.  Will continue to monitor

## 2023-05-08 NOTE — Assessment & Plan Note (Signed)
 Continue amlodipine and metoprolol

## 2023-05-08 NOTE — Progress Notes (Signed)
 Patient arrives to 5W12 at this time from ED.  Patient accompanied by sitter.

## 2023-05-08 NOTE — Assessment & Plan Note (Signed)
 Continue aspirin and statin.

## 2023-05-08 NOTE — ED Notes (Signed)
 Patient transported to CT

## 2023-05-08 NOTE — Assessment & Plan Note (Signed)
-   Patient endorses over 1 week since last bowel movement and takes Linzess at home - Resume Linzess - Continue MiraLAX and Senokot

## 2023-05-08 NOTE — Assessment & Plan Note (Signed)
 Continue statin.

## 2023-05-08 NOTE — Assessment & Plan Note (Addendum)
-   Likely due to ongoing viral infection, influenza - Has progressively become weaker at home with recurrent falls -Trial of fluids - Follow-up PT eval

## 2023-05-08 NOTE — ED Notes (Signed)
 Patient observed resting in bed with eyes closed.  Sides remain up on both sides.  Call bell within reach.  Fall mat on floor

## 2023-05-08 NOTE — Assessment & Plan Note (Signed)
-   Considered in setting of anemia of chronic disease

## 2023-05-08 NOTE — Assessment & Plan Note (Deleted)
 Continue aspirin and statin.

## 2023-05-08 NOTE — ED Provider Notes (Signed)
 [place] EMERGENCY DEPARTMENT AT [facility] Provider Note   CSN: [id number] Arrival date & time: 05/07/23  2351     History  Chief Complaint  Patient presents with   Cough    Peter Mann is a 83 y.o. male.  Patient presents to the emergency department via EMS complaining of a cough which is reportedly been ongoing for 3 weeks with associated weakness.  He was seen earlier today in urgent care with constipation and urinary symptoms including recent urinary tract infection.  UTI appears to have treated with Keflex.  He was discharged from the urgent care with dietary and lifestyle recommendations for some constipation.  UA did not show signs of UTI.  Since leaving the urgent care patient reports 2 or 3 falls at home.  His most recent fall was slipping out of bed this evening.  EMS reports finding him in the floor soaked in urine.  He was too weak to get back to the bed.  Past medical history significant for hypertension, CVA, type II DM, dementia   Cough      Home Medications Prior to Admission medications   Medication Sig Start Date End Date Taking? Authorizing Provider  acetaminophen (TYLENOL) 500 MG tablet Take 1,000 mg by mouth every 6 (six) hours as needed for mild pain or headache.   Yes [provider]  amLODipine (NORVASC) 10 MG tablet Take 10 mg by mouth daily.   Yes [provider]  aspirin 325 MG tablet Take 1 tablet (325 mg total) by mouth daily. 08/27/15   Albertine Grates, MD  chlorhexidine (PERIDEX) 0.12 % solution SMARTSIG:15 Milliliter(s) By Mouth Every Morning-Evening 09/22/19   [provider]  cycloSPORINE (RESTASIS) 0.05 % ophthalmic emulsion Place 1 drop into the left eye daily.     [provider]  dexlansoprazole (DEXILANT) 60 MG capsule Take 60 mg by mouth daily.    [provider]  diclofenac sodium (VOLTAREN) 1 % GEL Apply 2 g topically as needed (pain).     [provider]  JARDIANCE 25 MG TABS  tablet Take 25 mg by mouth daily.    [provider]  Lancets [facility] DELICA PLUS [place]) MISC  12/04/19   [provider]  lubiprostone (AMITIZA) 24 MCG capsule Take 24 mcg by mouth 2 (two) times daily with a meal. Patient not taking: Reported on 02/25/2022    [provider]  meclizine (ANTIVERT) 25 MG tablet Take 25 mg by mouth 2 (two) times daily.   Yes [provider]  metoprolol tartrate (LOPRESSOR) 25 MG tablet TAKE 1 TABLET BY MOUTH TWICE A DAY 12/08/22  Yes Runell Gess, MD  ONE TOUCH ULTRA TEST test strip  03/31/17   [provider]  polyethylene glycol (MIRALAX) 17 g packet Take 17 g by mouth daily as needed for moderate constipation or mild constipation (if no bowel movement in 2 days). 05/15/22   Merrilee Jansky, MD  rosuvastatin (CRESTOR) 20 MG tablet Take 20 mg by mouth daily.   Yes [provider]  saxagliptin HCl (ONGLYZA) 5 MG TABS tablet Take 5 mg by mouth daily.    [provider]  senna-docusate (SENOKOT-S) 8.6-50 MG tablet Take 1 tablet by mouth at bedtime. 05/15/22   Merrilee Jansky, MD  tamsulosin (FLOMAX) 0.4 MG CAPS capsule Take 0.4 mg by mouth daily.    Yes [provider]  tiZANidine (ZANAFLEX) 4 MG tablet Take 4 mg by mouth every 6 (six) hours as  needed. 12/28/15  Yes [provider]      Allergies    Patient has no known allergies.    Review of Systems   Review of Systems  Respiratory:  Positive for cough.     Physical Exam Updated Vital Signs BP (!) 149/74   Pulse 74   Temp 98.1 F (36.7 C) (Oral)   Resp 14   Ht 5\' 7"  (1.702 m)   Wt 78.9 kg   SpO2 99%   BMI 27.25 kg/m  Physical Exam Vitals and nursing note reviewed.  Constitutional:      Appearance: He is well-developed.  HENT:     Head: Normocephalic and atraumatic.  Eyes:     Conjunctiva/sclera: Conjunctivae normal.  Cardiovascular:     Rate and Rhythm: Normal rate and regular rhythm.  Pulmonary:      Effort: Pulmonary effort is normal. No respiratory distress.     Breath sounds: Normal breath sounds.  Abdominal:     Palpations: Abdomen is soft.     Tenderness: There is no abdominal tenderness.  Musculoskeletal:        General: No swelling.     Cervical back: Neck supple.     Right lower leg: No edema.     Left lower leg: No edema.  Skin:    General: Skin is warm and dry.     Capillary Refill: Capillary refill takes less than 2 seconds.  Neurological:     Mental Status: He is alert.     Motor: Weakness present.     Comments: Generalized weakness, no unilateral deficit  Psychiatric:        Mood and Affect: Mood normal.     ED Results / Procedures / Treatments   Labs (all labs ordered are listed, but only abnormal results are displayed) Labs Reviewed  RESP PANEL BY RT-PCR (RSV, FLU A&B, COVID)  RVPGX2 - Abnormal; Notable for the following components:      Result Value   Influenza A by PCR POSITIVE (*)    All other components within normal limits  URINALYSIS, ROUTINE W REFLEX MICROSCOPIC - Abnormal; Notable for the following components:   Glucose, UA >=500 (*)    Hgb urine dipstick LARGE (*)    Protein, ur 100 (*)    All other components within normal limits  COMPREHENSIVE METABOLIC PANEL - Abnormal; Notable for the following components:   Glucose, Bld 120 (*)    Creatinine, Ser 1.30 (*)    Albumin 3.4 (*)    AST 59 (*)    GFR, Estimated 54 (*)    All other components within normal limits  CBC WITH DIFFERENTIAL/PLATELET - Abnormal; Notable for the following components:   RBC 3.85 (*)    Hemoglobin 10.2 (*)    HCT 32.8 (*)    RDW 15.7 (*)    Platelets 134 (*)    Lymphs Abs 0.4 (*)    All other components within normal limits  CBG MONITORING, ED - Abnormal; Notable for the following components:   Glucose-Capillary 123 (*)    All other components within normal limits    EKG None  Radiology CT Head Wo Contrast Result Date: 05/08/2023 CLINICAL DATA:  Recent head  trauma with headaches, initial encounter EXAM: CT HEAD WITHOUT CONTRAST TECHNIQUE: Contiguous axial images were obtained from the base of the skull through the vertex without intravenous contrast. RADIATION DOSE REDUCTION: This exam was performed according to the departmental dose-optimization program which includes automated exposure control, adjustment of the  mA and/or kV according to patient size and/or use of iterative reconstruction technique. COMPARISON:  08/23/2015 FINDINGS: Brain: No evidence of acute infarction, hemorrhage, hydrocephalus, extra-axial collection or mass lesion/mass effect. Chronic atrophic and white matter ischemic changes are again seen. 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 Vascular: No hyperdense vessel or unexpected calcification. Skull: Normal. Negative for fracture or focal lesion. Sinuses/Orbits: No acute finding. Other: None. IMPRESSION: Chronic atrophic and ischemic changes as described. Electronically Signed   By: Alcide Clever M.D.   On: 05/08/2023 03:39   DG Chest Portable 1 View Result Date: 05/08/2023 CLINICAL DATA:  Cough. EXAM: PORTABLE CHEST 1 VIEW COMPARISON:  Chest x-ray 04/16/2018 FINDINGS: Cardiac silhouette is mildly enlarged. The lungs are clear. There is no pleural effusion or pneumothorax. No acute fractures are seen. IMPRESSION: Mildly enlarged cardiac silhouette. No acute cardiopulmonary process. Electronically Signed   By: Darliss Cheney M.D.   On: 05/08/2023 02:36    Procedures Procedures    Medications Ordered in ED Medications  sodium chloride 0.9 % bolus 1,000 mL (1,000 mLs Intravenous New Bag/Given 05/08/23 0438)    ED Course/ Medical Decision Making/ A&P Clinical Course as of 05/08/23 0508  Fri May 08, 2023  0351 Patient found by myself lying in floor beside bed surrounded by urine.  Patient states he thought he was at home and lowered the bed rail [LM]    Clinical Course User Index [LM] Pamala Duffel                                  Medical Decision Making Amount and/or Complexity of Data Reviewed Labs: ordered. Radiology: ordered.  Risk Decision regarding hospitalization.   This patient presents to the ED for concern of weakness and cough, this involves an extensive number of treatment options, and is a complaint that carries with it a high risk of complications and morbidity.  The differential diagnosis includes viral infection including COVID, influenza, RSV; pneumonia, sepsis, others   Co morbidities that complicate the patient evaluation  CVA, dementia, type II DM   Additional history obtained:  Additional history obtained from EMS External records from outside source obtained and reviewed including urgent care notes   Lab Tests:  I Ordered, and personally interpreted labs.  The pertinent results include: UA with large hemoglobin, nitrite negative, leukocytes negative, no bacteria; hemoglobin 10.2 grossly at baseline; grossly unremarkable CMP; positive influenza A test result   Imaging Studies ordered:  I ordered imaging studies including chest x-ray and CT head without contrast I independently visualized and interpreted imaging which showed  Mildly enlarged cardiac silhouette. No acute cardiopulmonary  process.  Chronic atrophic and ischemic changes as described.  I agree with the radiologist interpretation   Cardiac Monitoring: / EKG:  The patient was maintained on a cardiac monitor.  I personally viewed and interpreted the cardiac monitored which showed an underlying rhythm of: Sinus rhythm  Interventions:                The patient was treated with a bolus of normal saline for possible dehydration.  Upon reassessment the patient was acting the same with no improvement in weakness.  Consultations Obtained:  I requested consultation with the hospitalist, Dr. Toniann Fail, and discussed lab and imaging findings as well as pertinent plan - they recommend:  Admission   Social Determinants of Health:  Patient lives at home alone   Test /  Admission - Considered:  Patient with generalized weakness likely secondary to influenza infection.  Patient had additional possible fall here at the emergency department and was unable to help himself up he.  Patient is unsafe for discharge to home at this time.  Will request admission to hospitalist service for further management of patient's weakness/influenza.         Final Clinical Impression(s) / ED Diagnoses Final diagnoses:  Influenza A  Generalized weakness    Rx / DC Orders ED Discharge Orders     None         Pamala Duffel 05/08/23 7628    Marily Memos, MD 05/09/23 [phone]

## 2023-05-08 NOTE — Progress Notes (Addendum)
 Progress Note    Loc Feinstein   MVH:[id number]  DOB: [birth year]  DOA: 05/07/2023     0 PCP: Fleet Contras, MD  Initial CC: fall at home, weakness  Hospital Course: Evans Levee is a 83 y.o. male with history of diabetes mellitus type 2, hypertension, chronic kidney disease stage III, chronic anemia, history of stroke and prior history of prostate cancer was brought to the ER after patient had a fall at home.   Patient states he has been having constant cough for the last 3 weeks and had gone to the urgent care day prior to admission.  Was treated symptomatically and after reaching home he had two more falls.  After the second fall he alerted the emergency care and was brought to the ER.  Patient denies any chest pain shortness of breath but has been having cough.  He has been experiencing some lower extremity pain.   In the ER CT head is unremarkable.  Influenza A was positive.  Labs show creatinine of 1.3 which is at baseline.  Hemoglobin 10.2.  Chest x-ray unremarkable.  Patient was started on hydration and admitted for generalized weakness.  PT was also consulted on admission.  Interval History:  Seen in ER this morning.  Remains weak and deconditioned appearing.  Also endorses still having difficulty with constipation and requesting his Linzess.  Assessment and Plan: * Weakness - Likely due to ongoing viral infection, influenza - Has progressively become weaker at home with recurrent falls -Trial of fluids - Follow-up PT eval  Influenza A - Symptoms have been going on for approximately 2 to 3 weeks.  Not a Tamiflu candidate - Continue supportive care  Constipation - Patient endorses over 1 week since last bowel movement and takes Linzess at home - Resume Linzess - Continue MiraLAX and Senokot  BPH (benign prostatic hyperplasia) - Continue Flomax  Thrombocytopenia (HCC) - suspect from acute illness - trend as needed  Anemia - Considered in setting of anemia of  chronic disease  History of stroke Continue aspirin and statin  Diabetes mellitus type 2, noninsulin dependent (HCC) - Continue SSI and CBG monitoring  Hyperlipidemia - Continue statin  Essential hypertension - Continue amlodipine and metoprolol   Old records reviewed in assessment of this patient  Antimicrobials:   DVT prophylaxis:  enoxaparin (LOVENOX) injection 40 mg Start: 05/08/23 1000   Code Status:   Code Status: Full Code  Mobility Assessment (Last 72 Hours)     Mobility Assessment   No documentation.           Barriers to discharge: None Disposition Plan: TBD HH orders placed: Status is: Observation  Objective: Blood pressure (!) 161/79, pulse 70, temperature 99.7 F (37.6 C), temperature source Oral, resp. rate 20, height 5\' 7"  (1.702 m), weight 78.9 kg, SpO2 93%.  Examination:  Physical Exam Constitutional:      General: He is not in acute distress.    Appearance: Normal appearance.  HENT:     Head: Normocephalic and atraumatic.     Mouth/Throat:     Mouth: Mucous membranes are moist.  Eyes:     Extraocular Movements: Extraocular movements intact.     Pupils:     Left eye: Pupil is not round ([place]ob[place] appreciated).  Cardiovascular:     Rate and Rhythm: Normal rate and regular rhythm.  Pulmonary:     Effort: Pulmonary effort is normal. No respiratory distress.     Breath sounds: Normal breath sounds. No wheezing.  Abdominal:  General: Bowel sounds are normal. There is no distension.     Palpations: Abdomen is soft.     Tenderness: There is no abdominal tenderness.  Musculoskeletal:        General: Normal range of motion.     Cervical back: Normal range of motion and neck supple.  Skin:    General: Skin is warm and dry.  Neurological:     General: No focal deficit present.     Mental Status: He is alert.  Psychiatric:        Mood and Affect: Mood normal.        Behavior: Behavior normal.      Consultants:    Procedures:     Data Reviewed: Results for orders placed or performed during the hospital encounter of 05/07/23 (from the past 24 hours)  Urinalysis, Routine w reflex microscopic -Urine, Clean Catch     Status: Abnormal   Collection Time: 05/08/23 12:09 AM  Result Value Ref Range   Color, Urine YELLOW YELLOW   APPearance CLEAR CLEAR   Specific Gravity, Urine 1.018 1.005 - 1.030   pH 6.0 5.0 - 8.0   Glucose, UA >=500 (A) NEGATIVE mg/dL   Hgb urine dipstick LARGE (A) NEGATIVE   Bilirubin Urine NEGATIVE NEGATIVE   Ketones, ur NEGATIVE NEGATIVE mg/dL   Protein, ur 478 (A) NEGATIVE mg/dL   Nitrite NEGATIVE NEGATIVE   Leukocytes,Ua NEGATIVE NEGATIVE   RBC / HPF 0-5 0 - 5 RBC/hpf   WBC, UA 0-5 0 - 5 WBC/hpf   Bacteria, UA NONE SEEN NONE SEEN   Squamous Epithelial / HPF 0-5 0 - 5 /HPF  Comprehensive metabolic panel     Status: Abnormal   Collection Time: 05/08/23 12:24 AM  Result Value Ref Range   Sodium 141 135 - 145 mmol/L   Potassium 3.5 3.5 - 5.1 mmol/L   Chloride 105 98 - 111 mmol/L   CO2 23 22 - 32 mmol/L   Glucose, Bld 120 (H) 70 - 99 mg/dL   BUN 18 8 - 23 mg/dL   Creatinine, Ser 2.95 (H) 0.61 - 1.24 mg/dL   Calcium 9.0 8.9 - 62.1 mg/dL   Total Protein 6.8 6.5 - 8.1 g/dL   Albumin 3.4 (L) 3.5 - 5.0 g/dL   AST 59 (H) 15 - 41 U/L   ALT 15 0 - 44 U/L   Alkaline Phosphatase 55 38 - 126 U/L   Total Bilirubin 0.6 0.0 - 1.2 mg/dL   GFR, Estimated 54 (L) >60 mL/min   Anion gap 13 5 - 15  CBC with Differential     Status: Abnormal   Collection Time: 05/08/23 12:24 AM  Result Value Ref Range   WBC 6.3 4.0 - 10.5 K/uL   RBC 3.85 (L) 4.22 - 5.81 MIL/uL   Hemoglobin 10.2 (L) 13.0 - 17.0 g/dL   HCT 30.8 (L) 65.7 - 84.6 %   MCV 85.2 80.0 - 100.0 fL   MCH 26.5 26.0 - 34.0 pg   MCHC 31.1 30.0 - 36.0 g/dL   RDW 96.2 (H) 95.2 - 84.1 %   Platelets 134 (L) 150 - 400 K/uL   nRBC 0.0 0.0 - 0.2 %   Neutrophils Relative % 84 %   Neutro Abs 5.3 1.7 - 7.7 K/uL   Lymphocytes Relative 6 %   Lymphs Abs  0.4 (L) 0.7 - 4.0 K/uL   Monocytes Relative 9 %   Monocytes Absolute 0.5 0.1 - 1.0 K/uL   Eosinophils Relative 0 %  Eosinophils Absolute 0.0 0.0 - 0.5 K/uL   Basophils Relative 0 %   Basophils Absolute 0.0 0.0 - 0.1 K/uL   Immature Granulocytes 1 %   Abs Immature Granulocytes 0.04 0.00 - 0.07 K/uL  Resp panel by RT-PCR (RSV, Flu A&B, Covid) Anterior Nasal Swab     Status: Abnormal   Collection Time: 05/08/23 12:36 AM   Specimen: Anterior Nasal Swab  Result Value Ref Range   SARS Coronavirus 2 by RT PCR NEGATIVE NEGATIVE   Influenza A by PCR POSITIVE (A) NEGATIVE   Influenza B by PCR NEGATIVE NEGATIVE   Resp Syncytial Virus by PCR NEGATIVE NEGATIVE  CBG monitoring, ED     Status: Abnormal   Collection Time: 05/08/23  1:04 AM  Result Value Ref Range   Glucose-Capillary 123 (H) 70 - 99 mg/dL  CBG monitoring, ED     Status: Abnormal   Collection Time: 05/08/23  9:28 AM  Result Value Ref Range   Glucose-Capillary 68 (L) 70 - 99 mg/dL  CBG monitoring, ED     Status: None   Collection Time: 05/08/23 11:44 AM  Result Value Ref Range   Glucose-Capillary 99 70 - 99 mg/dL    I have reviewed pertinent nursing notes, vitals, labs, and images as necessary. I have ordered labwork to follow up on as indicated.  I have reviewed the last notes from staff over past 24 hours. I have discussed patient's care plan and test results with nursing staff, CM/SW, and other staff as appropriate.  Time spent: Greater than 50% of the 55 minute visit was spent in counseling/coordination of care for the patient as laid out in the A&P.   LOS: 0 days   Lewie Chamber, MD [facility] 05/08/2023, 12:48 PM

## 2023-05-08 NOTE — H&P (Signed)
 History and Physical    Peter Mann JXB:[id number] DOB: [birth year] DOA: 05/07/2023  Patient coming from: Home.  Chief Complaint: Fall.  Weakness.  HPI: Peter Mann is a 83 y.o. male with history of diabetes mellitus type 2, hypertension, chronic kidney disease stage III, chronic anemia, history of stroke and prior history of prostate cancer was brought to the ER after patient had a fall at home.  Patient states he has been having constant cough for the last 3 weeks and had gone to the urgent care yesterday.  Was treated symptomatically and after reaching home he had two more falls.  After the second fall he alerted the emergency care and was brought to the ER.  Patient denies any chest pain shortness of breath but has been having cough.  He has been experiencing some lower extremity pain.  ED Course: In the ER CT head is unremarkable.  Influenza A was positive.  Labs show creatinine of 1.3 which is at baseline.  Hemoglobin 10.2.  Chest x-ray unremarkable.  Patient was started on hydration and admitted for generalized weakness.  Review of Systems: As per HPI, rest all negative.   Past Medical History:  Diagnosis Date   At low risk for fall    Cancer (HCC)    prostrate ca, radiation treatments   Chest pain    COPD (chronic obstructive pulmonary disease) (HCC)    Dementia (HCC)    Diabetes mellitus without complication (HCC)    Dizziness    History of tobacco abuse    Hyperlipidemia    Hypertension    Stroke [facility])    Subconjunctival hemorrhage of left eye 07/18/2019   The condition of the involved eye is that of a subconjunctival hemorrhage.  These are often spontaneous but are also often at or near the site of low location of an injection should to be placed into the eye.  In the absence of direct blunt or severe trauma these will typically resolve on their own spontaneously and have no impact on the vision.    These are very similar to having a bruise in your    Past Surgical  History:  Procedure Laterality Date   CHOLECYSTECTOMY N/A 01/30/2014   Procedure: LAPAROSCOPIC CHOLECYSTECTOMY;  Surgeon: Axel Filler, MD;  Location: [facility] OR;  Service: General;  Laterality: N/A;     reports that he quit smoking about 4 years ago. His smoking use included cigarettes. He has never used smokeless tobacco. He reports that he does not drink alcohol and does not use drugs.  No Known Allergies  Family History  Problem Relation Age of Onset   Cancer Brother    Cancer Sister     Prior to Admission medications   Medication Sig Start Date End Date Taking? Authorizing Provider  acetaminophen (TYLENOL) 500 MG tablet Take 1,000 mg by mouth every 6 (six) hours as needed for mild pain or headache.   Yes [provider]  amLODipine (NORVASC) 10 MG tablet Take 10 mg by mouth daily.   Yes [provider]  meclizine (ANTIVERT) 25 MG tablet Take 25 mg by mouth 2 (two) times daily.   Yes [provider]  metoprolol tartrate (LOPRESSOR) 25 MG tablet TAKE 1 TABLET BY MOUTH TWICE A DAY 12/08/22  Yes Runell Gess, MD  rosuvastatin (CRESTOR) 20 MG tablet Take 20 mg by mouth daily.   Yes [provider]  tamsulosin (FLOMAX) 0.4 MG CAPS capsule Take 0.4 mg by mouth daily.    Yes [provider]  tiZANidine (ZANAFLEX) 4 MG tablet Take 4 mg by mouth every 6 (six) hours as needed. 12/28/15  Yes [provider]  aspirin 325 MG tablet Take 1 tablet (325 mg total) by mouth daily. 08/27/15   Albertine Grates, MD  chlorhexidine (PERIDEX) 0.12 % solution SMARTSIG:15 Milliliter(s) By Mouth Every Morning-Evening 09/22/19   [provider]  cycloSPORINE (RESTASIS) 0.05 % ophthalmic emulsion Place 1 drop into the left eye daily.     [provider]  dexlansoprazole (DEXILANT) 60 MG capsule Take 60 mg by mouth daily.    [provider]  diclofenac sodium (VOLTAREN) 1 % GEL Apply 2 g topically as needed (pain).     [provider]  JARDIANCE 25 MG TABS tablet Take 25 mg by mouth daily.    [provider]  Lancets [facility] DELICA PLUS [place]) MISC  12/04/19   [provider]  lubiprostone (AMITIZA) 24 MCG capsule Take 24 mcg by mouth 2 (two) times daily with a meal. Patient not taking: Reported on 02/25/2022    [provider]  ONE TOUCH ULTRA TEST test strip  03/31/17   [provider]  polyethylene glycol (MIRALAX) 17 g packet Take 17 g by mouth daily as needed for moderate constipation or mild constipation (if no bowel movement in 2 days). 05/15/22   Lamptey, Britta Mccreedy, MD  saxagliptin HCl (ONGLYZA) 5 MG TABS tablet Take 5 mg by mouth daily.    [provider]  senna-docusate (SENOKOT-S) 8.6-50 MG tablet Take 1 tablet by mouth at bedtime. 05/15/22   Merrilee Jansky, MD    Physical Exam: Constitutional: Moderately built and nourished. Vitals:   05/08/23 0220 05/08/23 0300 05/08/23 0405 05/08/23 0407  BP: (!) 154/59 (!) 157/74 (!) 149/74   Pulse: 76 77 74   Resp: (!) 21 (!) 21 14   Temp:    98.1 F (36.7 C)  TempSrc:    Oral  SpO2: 98% 100% 99%   Weight:      Height:       Eyes: Anicteric no pallor. ENMT: No discharge from the ears eyes nose or mouth. Neck: No mass felt.  No neck rigidity. Respiratory: No rhonchi or crepitations. Cardiovascular: S1-S2 heard. Abdomen: Soft nontender bowel sound present. Musculoskeletal: No edema.  Able to move his extremities without much pain. Skin: No rash. Neurologic: Alert awake oriented time place and person pulmonary or extremities. Psychiatric: Oriented to time place and person.   Labs on Admission: I have personally reviewed following labs and imaging studies  CBC: Recent Labs  Lab 05/08/23 0024  WBC 6.3  NEUTROABS 5.3  HGB 10.2*  HCT 32.8*  MCV 85.2  PLT 134*   Basic Metabolic Panel: Recent Labs  Lab 05/08/23 0024  NA 141  K 3.5  CL 105  CO2 23  GLUCOSE 120*  BUN 18  CREATININE 1.30*  CALCIUM  9.0   GFR: Estimated Creatinine Clearance: 39.5 mL/min (A) (by C-G formula based on SCr of 1.3 mg/dL (H)). Liver Function Tests: Recent Labs  Lab 05/08/23 0024  AST 59*  ALT 15  ALKPHOS 55  BILITOT 0.6  PROT 6.8  ALBUMIN 3.4*   No results for input(s): "LIPASE", "AMYLASE" in the last 168 hours. No results for input(s): "AMMONIA" in the last 168 hours. Coagulation Profile: No results for input(s): "INR", "PROTIME" in the last 168 hours. Cardiac Enzymes: No results for input(s): "CKTOTAL", "CKMB", "CKMBINDEX", "TROPONINI" in the last 168 hours. BNP (last 3  results) No results for input(s): "PROBNP" in the last 8760 hours. HbA1C: No results for input(s): "HGBA1C" in the last 72 hours. CBG: Recent Labs  Lab 05/08/23 0104  GLUCAP 123*   Lipid Profile: No results for input(s): "CHOL", "HDL", "LDLCALC", "TRIG", "CHOLHDL", "LDLDIRECT" in the last 72 hours. Thyroid Function Tests: No results for input(s): "TSH", "T4TOTAL", "FREET4", "T3FREE", "THYROIDAB" in the last 72 hours. Anemia Panel: No results for input(s): "VITAMINB12", "FOLATE", "FERRITIN", "TIBC", "IRON", "RETICCTPCT" in the last 72 hours. Urine analysis:    Component Value Date/Time   COLORURINE YELLOW 05/08/2023 0009   APPEARANCEUR CLEAR 05/08/2023 0009   LABSPEC 1.018 05/08/2023 0009   LABSPEC 1.010 01/19/2006 1117   PHURINE 6.0 05/08/2023 0009   GLUCOSEU >=500 (A) 05/08/2023 0009   HGBUR LARGE (A) 05/08/2023 0009   BILIRUBINUR NEGATIVE 05/08/2023 0009   BILIRUBINUR negative 05/07/2023 1045   BILIRUBINUR Negative 01/19/2006 1117   KETONESUR NEGATIVE 05/08/2023 0009   PROTEINUR 100 (A) 05/08/2023 0009   UROBILINOGEN 1.0 05/07/2023 1045   UROBILINOGEN 0.2 08/31/2020 1208   NITRITE NEGATIVE 05/08/2023 0009   LEUKOCYTESUR NEGATIVE 05/08/2023 0009   LEUKOCYTESUR Negative 01/19/2006 1117   Sepsis Labs: @LABRCNTIP (procalcitonin:4,lacticidven:4) ) Recent Results (from the past 240 hours)  Resp panel by RT-PCR  (RSV, Flu A&B, Covid) Anterior Nasal Swab     Status: Abnormal   Collection Time: 05/08/23 12:36 AM   Specimen: Anterior Nasal Swab  Result Value Ref Range Status   SARS Coronavirus 2 by RT PCR NEGATIVE NEGATIVE Final   Influenza A by PCR POSITIVE (A) NEGATIVE Final   Influenza B by PCR NEGATIVE NEGATIVE Final    Comment: (NOTE) The Xpert Xpress SARS-CoV-2/FLU/RSV plus assay is intended as an aid in the diagnosis of influenza from Nasopharyngeal swab specimens and should not be used as a sole basis for treatment. Nasal washings and aspirates are unacceptable for Xpert Xpress SARS-CoV-2/FLU/RSV testing.  Fact Sheet for Patients: BloggerCourse.com  Fact Sheet for Healthcare Providers: SeriousBroker.it  This test is not yet approved or cleared by the [place] FDA and has been authorized for detection and/or diagnosis of SARS-CoV-2 by FDA under an Emergency Use Authorization (EUA). This EUA will remain in effect (meaning this test can be used) for the duration of the COVID-19 declaration under Section 564(b)(1) of the Act, 21 U.S.C. section 360bbb-3(b)(1), unless the authorization is terminated or revoked.     Resp Syncytial Virus by PCR NEGATIVE NEGATIVE Final    Comment: (NOTE) Fact Sheet for Patients: BloggerCourse.com  Fact Sheet for Healthcare Providers: SeriousBroker.it  This test is not yet approved or cleared by the [place] FDA and has been authorized for detection and/or diagnosis of SARS-CoV-2 by FDA under an Emergency Use Authorization (EUA). This EUA will remain in effect (meaning this test can be used) for the duration of the COVID-19 declaration under Section 564(b)(1) of the Act, 21 U.S.C. section 360bbb-3(b)(1), unless the authorization is terminated or revoked.  Performed at [facility], [address], [place], [place] [address]       Radiological Exams on Admission: CT Head Wo Contrast Result Date: 05/08/2023 CLINICAL DATA:  Recent head trauma with headaches, initial encounter EXAM: CT HEAD WITHOUT CONTRAST TECHNIQUE: Contiguous axial images were obtained from the base of the skull through the vertex without intravenous contrast. RADIATION DOSE REDUCTION: This exam was performed according to the departmental dose-optimization program which includes automated exposure control, adjustment of the mA and/or kV according to patient size and/or use of iterative  reconstruction technique. COMPARISON:  08/23/2015 FINDINGS: Brain: No evidence of acute infarction, hemorrhage, hydrocephalus, extra-axial collection or mass lesion/mass effect. Chronic atrophic and white matter ischemic changes are again seen. 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 Vascular: No hyperdense vessel or unexpected calcification. Skull: Normal. Negative for fracture or focal lesion. Sinuses/Orbits: No acute finding. Other: None. IMPRESSION: Chronic atrophic and ischemic changes as described. Electronically Signed   By: Alcide Clever M.D.   On: 05/08/2023 03:39   DG Chest Portable 1 View Result Date: 05/08/2023 CLINICAL DATA:  Cough. EXAM: PORTABLE CHEST 1 VIEW COMPARISON:  Chest x-ray 04/16/2018 FINDINGS: Cardiac silhouette is mildly enlarged. The lungs are clear. There is no pleural effusion or pneumothorax. No acute fractures are seen. IMPRESSION: Mildly enlarged cardiac silhouette. No acute cardiopulmonary process. Electronically Signed   By: Darliss Cheney M.D.   On: 05/08/2023 02:36    EKG: Independently reviewed.  Normal sinus rhythm.  Assessment/Plan Principal Problem:   Weakness Active Problems:   Essential hypertension   Hyperlipidemia   CVA (cerebral vascular accident) (HCC)   HLD (hyperlipidemia)   Diabetes mellitus type 2, noninsulin dependent (HCC)   History of stroke   Anemia   Thrombocytopenia (HCC)   Fall at home, initial  encounter    Generalized weakness and fall likely related to patient's deconditioning from influenza A infection.  Patient has been having cough for last 3 weeks so not started on Tamiflu.  Given the weakness will gently hydrate.  Get physical therapy consult.  Also check CK levels TSH.  Patient is complaining of increasing lower extremity pain x-ray pelvis and ABI pending. Hypertension on amlodipine and metoprolol. Diabetes mellitus type 2 takes Onglyza and Jardiance.  On sliding scale coverage for now.  No recent hemoglobin A1c.  Check hemoglobin A1c. Chronic kidney disease stage III creatinine at her baseline. Anemia likely from renal disease.  Follow CBC.  Check anemia panel. History of BPH on tamsulosin. History of stroke on statins and aspirin. Thrombocytopenia appears to be new.  Follow CBC.  Since patient has generalized weakness in the setting of influenza A will need continuous monitoring hydration and more than 2 midnight stay.   DVT prophylaxis: Lovenox. Code Status: Full code. Family Communication: Patient's daughter Ms. Peter Mann. Disposition Plan: Medical floor. Consults called: Physical therapy. Admission status: Observation.

## 2023-05-08 NOTE — Evaluation (Signed)
 Physical Therapy Evaluation Patient Details Name: Peter Mann MRN: [id number] DOB: [birth year] Today's Date: 05/08/2023  History of Present Illness  Pt is 83 yo male who presented to hospital on 05/07/23 after fall with weakness and coughing.  Pt with + influenza.  Pt with hx including but not limited to DM2, HTN, CKD 3, anemia, CVA, prostate CA  Clinical Impression  Pt admitted with above diagnosis. Pt lives alone and only has intermittent support.  Reports he is typically independent with adls and ambulation without AD.  Pt has had at least 3 recent falls.  Pt is questionable historian and with poor safety awareness and impulsive - no family present , unsure of baseline cognition.  Today, pt requiring mod A for bed mobility and min A to stand and ambulate 120' but needing mod/max cues for safety.   Pt currently with functional limitations due to the deficits listed below (see PT Problem List). Pt will benefit from acute skilled PT to increase their independence and safety with mobility to allow discharge.  At this time recommend Patient will benefit from continued inpatient follow up therapy, <3 hours/day at d/c.  However, pt with good potential to progress and if had initial supervision could potentially progress to lower level of care.          If plan is discharge home, recommend the following: A little help with walking and/or transfers;A little help with bathing/dressing/bathroom;Assistance with cooking/housework;Help with stairs or ramp for entrance   Can travel by private vehicle   Yes    Equipment Recommendations Rolling walker (2 wheels)  Recommendations for Other Services       Functional Status Assessment Patient has had a recent decline in their functional status and demonstrates the ability to make significant improvements in function in a reasonable and predictable amount of time.     Precautions / Restrictions Precautions Precautions: Fall      Mobility  Bed  Mobility Overal bed mobility: Needs Assistance Bed Mobility: Supine to Sit, Sit to Supine     Supine to sit: Mod assist Sit to supine: Min assist   General bed mobility comments: Pt requiring mod A to lift trunk and cues to scoot forward.  Min A for legs back to bed and repositioning.    Transfers Overall transfer level: Needs assistance Equipment used: Rolling walker (2 wheels) Transfers: Sit to/from Stand Sit to Stand: Min assist           General transfer comment: Cues for hand placement ; Max cues to wait for line management; min A to rise and steady    Ambulation/Gait Ambulation/Gait assistance: Min assist Gait Distance (Feet): 120 Feet Assistive device: Rolling walker (2 wheels) Gait Pattern/deviations: Step-through pattern, Decreased stride length Gait velocity: normal     General Gait Details: Pt overall with step through pattern but with occasional shuffle, cues to keep RW close, poor safety - cues for RW around obstacle, cues for safe speed (pt stating , I can run and started going faster at one point), min A for balance at times  Stairs            Wheelchair Mobility     Tilt Bed    Modified Rankin (Stroke Patients Only)       Balance Overall balance assessment: Needs assistance Sitting-balance support: No upper extremity supported Sitting balance-Leahy Scale: Good     Standing balance support: Bilateral upper extremity supported, Reliant on assistive device for balance Standing balance-Leahy Scale: Poor Standing balance comment:  RW and min A at times                             Pertinent Vitals/Pain Pain Assessment Pain Assessment: No/denies pain    Home Living Family/patient expects to be discharged to:: Private residence Living Arrangements: Alone Available Help at Discharge:  (reports daughter lives in [place]) Type of Home: Apartment Home Access: Level entry       Home Layout: One level Home Equipment: Cane -  single point;BSC/3in1 Additional Comments: Pt questionable historian - prior note states had 3 steps to enter and has SPC and BSC at home.  Pt reports may have RW at home    Prior Function Prior Level of Function : Patient poor historian/Family not available             Mobility Comments: Reports ambulates in community without AD; Has had at least 3 recent falls (all seem to be from bed per pt report) ADLs Comments: Performed adls and light iadls     Extremity/Trunk Assessment   Upper Extremity Assessment Upper Extremity Assessment: Generalized weakness    Lower Extremity Assessment Lower Extremity Assessment: Generalized weakness    Cervical / Trunk Assessment Cervical / Trunk Assessment: Kyphotic  Communication   Communication Communication: Impaired Factors Affecting Communication: Hearing impaired;Reduced clarity of speech    Cognition Arousal: Alert Behavior During Therapy: Impulsive   PT - Cognitive impairments: No family/caregiver present to determine baseline, Problem solving, Safety/Judgement, Awareness                       PT - Cognition Comments: Pt with decreased safety awareness, "brushes off" falls like they were not significant, impulsive with transfers particularly with lines/leads.  Oriented to self, time, place Following commands: Intact       Cueing Cueing Techniques: Verbal cues, Tactile cues     General Comments General comments (skin integrity, edema, etc.): VSS    Exercises     Assessment/Plan    PT Assessment Patient needs continued PT services  PT Problem List Decreased strength;Decreased range of motion;Decreased activity tolerance;Decreased balance;Decreased mobility;Decreased cognition;Decreased knowledge of use of DME;Decreased safety awareness;Cardiopulmonary status limiting activity       PT Treatment Interventions DME instruction;Therapeutic exercise;Gait training;Balance training;Therapeutic activities;Patient/family  education;Functional mobility training;Stair training;Neuromuscular re-education;Cognitive remediation    PT Goals (Current goals can be found in the Care Plan section)  Acute Rehab PT Goals Patient Stated Goal: return home PT Goal Formulation: With patient Time For Goal Achievement: 05/22/23 Potential to Achieve Goals: Good    Frequency Min 1X/week     Co-evaluation               AM-PAC PT "6 Clicks" Mobility  Outcome Measure Help needed turning from your back to your side while in a flat bed without using bedrails?: A Little Help needed moving from lying on your back to sitting on the side of a flat bed without using bedrails?: A Lot Help needed moving to and from a bed to a chair (including a wheelchair)?: A Little Help needed standing up from a chair using your arms (e.g., wheelchair or bedside chair)?: A Lot (max cues) Help needed to walk in hospital room?: A Lot (max cues) Help needed climbing 3-5 steps with a railing? : A Lot 6 Click Score: 14    End of Session Equipment Utilized During Treatment: Gait belt Activity Tolerance: Patient tolerated treatment well Patient left:  in bed;with call bell/phone within reach;with nursing/sitter in room;with bed alarm set Nurse Communication: Mobility status PT Visit Diagnosis: Other abnormalities of gait and mobility (R26.89);Muscle weakness (generalized) (M62.81)    Time: 7829-5621 PT Time Calculation (min) (ACUTE ONLY): 23 min   Charges:   PT Evaluation $PT Eval Low Complexity: 1 Low PT Treatments $Gait Training: 8-22 mins PT General Charges $$ ACUTE PT VISIT: 1 Visit         Anise Salvo, PT Acute Rehab Services [facility] [phone]   Rayetta Humphrey 05/08/2023, 4:00 PM

## 2023-05-08 NOTE — Hospital Course (Signed)
 Peter Mann is a 83 y.o. male with history of diabetes mellitus type 2, hypertension, chronic kidney disease stage III, chronic anemia, history of stroke and prior history of prostate cancer was brought to the ER after patient had a fall at home.   Patient states he has been having constant cough for the last 3 weeks and had gone to the urgent care day prior to admission.  Was treated symptomatically and after reaching home he had two more falls.  After the second fall he alerted the emergency care and was brought to the ER.  Patient denies any chest pain shortness of breath but has been having cough.  He has been experiencing some lower extremity pain.   In the ER CT head is unremarkable.  Influenza A was positive.  Labs show creatinine of 1.3 which is at baseline.  Hemoglobin 10.2.  Chest x-ray unremarkable.  Patient was started on hydration and admitted for generalized weakness.  PT was also consulted on admission.

## 2023-05-08 NOTE — ED Notes (Signed)
 Pt was found laying next to stretcher by Clarene Critchley upon entering the room, the provider came to let this RN know that pt was on the floor. NT Danford Bad last checked on pt 10-15 mins prior to [employer] entering room and this RN rounded on pt who appeared at rest with eyes closed at 03:39 am. Upon entering the room after PA-C Peyton Najjar found pt on the floor this RN and NT Danford Bad found pt laying on the left side of the stretcher, with the left side rail down. When asked how pt got on the floor the pt stated he climb out the foot of the bed to get his urinal because he dropped it on the floor. When asked how the side rail was let down, pt reported that he left the side rail down to get back in bed but was not able to get up to the bed, stated he thought he was at home and said "I knew my bed didn't have a rail, I was wondering why my bed had a rail". Explained to pt that he was not at home that he was at the hospital, pt stated "I know that now". Pt denies any injuries or any new pain. Asked pt why he did not use the call bell, pt stated "I don't need one at home". Pt was assisted by this RN and NT Kristie back to stretcher, pt was able to stand with two-person assist and sit on stretcher. Pt was moved to a closer room at the nurses station (RM 26 to RM 24), pt was clean, a clean gown was applied as gown was soak from urine as pt urinal was dump on the floor and pt laying in urine and a new brief was applied. Pt remains A&O at baseline. Charge Emilee notified.

## 2023-05-08 NOTE — Assessment & Plan Note (Signed)
-   Symptoms have been going on for approximately 2 to 3 weeks.  Not a Tamiflu candidate - Continue supportive care

## 2023-05-08 NOTE — ED Notes (Signed)
 FALL SAFETY: Pt has yellow fall bracelet on right wrist Non-slip socks on bilateral feet Bed alarm on and active Bed in lowest position & side rail up x2 Foot of bed elevated in attempt to keep pt from scooting to foot of bed again Yellow fall magnet on door frame Door to RM open, staff can observe pt from nurses station Intermittent safety rounding in place.  Pt educated on fall safety plan, verbalized understanding, reminded pt to use call bell Fall safety matt on floor

## 2023-05-08 NOTE — Assessment & Plan Note (Signed)
-   suspect from acute illness - trend as needed

## 2023-05-08 NOTE — Assessment & Plan Note (Signed)
-   Continue SSI and CBG monitoring ?

## 2023-05-08 NOTE — Assessment & Plan Note (Signed)
 -  Continue Flomax

## 2023-05-09 ENCOUNTER — Inpatient Hospital Stay (HOSPITAL_COMMUNITY)

## 2023-05-09 DIAGNOSIS — R531 Weakness: Secondary | ICD-10-CM | POA: Diagnosis not present

## 2023-05-09 DIAGNOSIS — M79673 Pain in unspecified foot: Secondary | ICD-10-CM

## 2023-05-09 LAB — CBC WITH DIFFERENTIAL/PLATELET
Abs Immature Granulocytes: 0.05 10*3/uL (ref 0.00–0.07)
Basophils Absolute: 0 10*3/uL (ref 0.0–0.1)
Basophils Relative: 0 %
Eosinophils Absolute: 0 10*3/uL (ref 0.0–0.5)
Eosinophils Relative: 0 %
HCT: 34.4 % — ABNORMAL LOW (ref 39.0–52.0)
Hemoglobin: 10.8 g/dL — ABNORMAL LOW (ref 13.0–17.0)
Immature Granulocytes: 1 %
Lymphocytes Relative: 9 %
Lymphs Abs: 0.6 10*3/uL — ABNORMAL LOW (ref 0.7–4.0)
MCH: 26.7 pg (ref 26.0–34.0)
MCHC: 31.4 g/dL (ref 30.0–36.0)
MCV: 84.9 fL (ref 80.0–100.0)
Monocytes Absolute: 0.7 10*3/uL (ref 0.1–1.0)
Monocytes Relative: 10 %
Neutro Abs: 5.4 10*3/uL (ref 1.7–7.7)
Neutrophils Relative %: 80 %
Platelets: 168 10*3/uL (ref 150–400)
RBC: 4.05 MIL/uL — ABNORMAL LOW (ref 4.22–5.81)
RDW: 15.5 % (ref 11.5–15.5)
WBC: 6.8 10*3/uL (ref 4.0–10.5)
nRBC: 0 % (ref 0.0–0.2)

## 2023-05-09 LAB — GLUCOSE, CAPILLARY
Glucose-Capillary: 92 mg/dL (ref 70–99)
Glucose-Capillary: 136 mg/dL — ABNORMAL HIGH (ref 70–99)
Glucose-Capillary: 145 mg/dL — ABNORMAL HIGH (ref 70–99)
Glucose-Capillary: 169 mg/dL — ABNORMAL HIGH (ref 70–99)

## 2023-05-09 LAB — BASIC METABOLIC PANEL
Anion gap: 12 (ref 5–15)
BUN: 17 mg/dL (ref 8–23)
CO2: 23 mmol/L (ref 22–32)
Calcium: 8.8 mg/dL — ABNORMAL LOW (ref 8.9–10.3)
Chloride: 104 mmol/L (ref 98–111)
Creatinine, Ser: 1.11 mg/dL (ref 0.61–1.24)
GFR, Estimated: >60 mL/min (ref >60)
Glucose, Bld: 104 mg/dL — ABNORMAL HIGH (ref 70–99)
Potassium: 3.5 mmol/L (ref 3.5–5.1)
Sodium: 139 mmol/L (ref 135–145)

## 2023-05-09 LAB — MAGNESIUM: Magnesium: 1.9 mg/dL (ref 1.7–2.4)

## 2023-05-09 LAB — BRAIN NATRIURETIC PEPTIDE: B Natriuretic Peptide: 80 pg/mL (ref 0.0–100.0)

## 2023-05-09 MED ORDER — POTASSIUM CHLORIDE CRYS ER 20 MEQ PO TBCR
40.0000 meq | EXTENDED_RELEASE_TABLET | Freq: Once | ORAL | Status: AC
  Administered 2023-05-09: 40 meq via ORAL
  Filled 2023-05-09: qty 2

## 2023-05-09 MED ORDER — DOCUSATE SODIUM 100 MG PO CAPS
200.0000 mg | ORAL_CAPSULE | Freq: Two times a day (BID) | ORAL | Status: DC
  Administered 2023-05-09 – 2023-05-12 (×4): 200 mg via ORAL
  Filled 2023-05-09 (×5): qty 2

## 2023-05-09 MED ORDER — LACTULOSE 10 GM/15ML PO SOLN
30.0000 g | Freq: Three times a day (TID) | ORAL | Status: AC
Start: 2023-05-09 — End: 2023-05-11
  Administered 2023-05-09 – 2023-05-10 (×2): 30 g via ORAL
  Filled 2023-05-09 (×3): qty 60

## 2023-05-09 MED ORDER — MAGNESIUM SULFATE IN D5W 1-5 GM/100ML-% IV SOLN
1.0000 g | Freq: Once | INTRAVENOUS | Status: AC
  Administered 2023-05-09: 1 g via INTRAVENOUS
  Filled 2023-05-09: qty 100

## 2023-05-09 MED ORDER — HYDRALAZINE HCL 20 MG/ML IJ SOLN: 10.0000 mg | Freq: Four times a day (QID) | INTRAMUSCULAR | Status: DC | PRN

## 2023-05-09 NOTE — Progress Notes (Signed)
 Patient up to chair with one assist, chair alarm on and functioning, patient call light within reach and functioning.

## 2023-05-09 NOTE — Progress Notes (Signed)
 Mobility Specialist: Progress Note   05/09/23 1306  Mobility  Activity Ambulated with assistance in hallway  Level of Assistance Contact guard assist, steadying assist  Assistive Device Front wheel walker  Distance Ambulated (ft) 100 ft  Activity Response Tolerated well  Mobility Referral Yes  Mobility visit 1 Mobility  Mobility Specialist Start Time (ACUTE ONLY) 1050  Mobility Specialist Stop Time (ACUTE ONLY) 1117  Mobility Specialist Time Calculation (min) (ACUTE ONLY) 27 min    Pt was agreeable to mobility session - received in bed. SV for bed mobility and STS, CG for ambulation for safety. Pt moving impulsively. Became incontinent of urine during session. After clean up, still agreeable to hallway ambulation. Suddenly started speed walking (2x) mid session and required mod cues for MS to either slow down or redirect pt. Returned to room without fault. Left in bed with all needs met, call bell in reach. Bed alarm on.   Maurene Capes Mobility Specialist Please contact via SecureChat or Rehab office at [phone]

## 2023-05-09 NOTE — Plan of Care (Signed)

## 2023-05-09 NOTE — Progress Notes (Signed)
 PROGRESS NOTE                                                                                                                                                                                                             Patient Demographics:    Peter Mann, is a 83 y.o. male, DOB - [birth year], NWG:[id number]  Outpatient Primary MD for the patient is Peter Contras, MD    LOS - 1  Admit date - 05/07/2023    Chief Complaint  Patient presents with   Cough       Brief Narrative (HPI from H&P)   83 y.o. male with history of diabetes mellitus type 2, hypertension, chronic kidney disease stage III, chronic anemia, history of stroke and prior history of prostate cancer was brought to the ER after patient had a fall at home.   Patient states he has been having constant cough for the last 3 weeks and had gone to the urgent care day prior to admission.  Was treated symptomatically and after reaching home he had two more falls.  After the second fall he alerted the emergency care and was brought to the ER.  Patient denies any chest pain shortness of breath but has been having cough.  He has been experiencing some lower extremity pain.     Subjective:    Peter Mann today has, No headache, No chest pain, No abdominal pain - No Nausea, No new weakness tingling or numbness, no SOB   Assessment  & Plan :    Recent influenza A infection causing decreased oral intake, dehydration, generalized weakness and falls. -Has had influenza infection diagnosed about 2 weeks ago, currently not a Tamiflu candidate, due to poor oral intake has become dehydrated and weak with multiple falls at home, hydrated with IV fluids, PT OT, qualifies for SNF.  Continue supportive care, head CT nonacute, no headache or focal deficits.   Constipation - Patient endorses he usually has bowel movements once every 1 to 2 weeks, placed on bowel regimen.  BPH (benign  prostatic hyperplasia) - Continue Flomax  Thrombocytopenia (HCC) - suspect from acute illness - trend as needed  Anemia - Considered in setting of anemia of chronic disease  History of stroke Continue aspirin and statin  Diabetes mellitus type 2, noninsulin dependent (HCC) - Continue SSI and CBG monitoring  Hyperlipidemia - Continue statin  Essential hypertension - Continue amlodipine and metoprolol, add as needed IV hydralazine.       Condition - Fair  Family Communication  :  None  Code Status :  Full  Consults  :  None  PUD Prophylaxis : PPI   Procedures  :     CT head.  Nonacute      Disposition Plan  :    Status is: Inpatient   DVT Prophylaxis  :    enoxaparin (LOVENOX) injection 40 mg Start: 05/08/23 1000    Lab Results  Component Value Date   PLT 168 05/09/2023    Diet :  Diet Order             Diet heart healthy/carb modified Room service appropriate? Yes; Fluid consistency: Thin  Diet effective now                    Inpatient Medications  Scheduled Meds:  amLODipine  10 mg Oral Daily   aspirin  325 mg Oral Daily   cycloSPORINE  1 drop Left Eye Daily   docusate sodium  200 mg Oral BID   enoxaparin (LOVENOX) injection  40 mg Subcutaneous Q24H   insulin aspart  0-9 Units Subcutaneous TID WC   lactulose  30 g Oral TID   linaclotide  290 mcg Oral Daily   metoprolol tartrate  25 mg Oral BID   pantoprazole  40 mg Oral Daily   polyethylene glycol  17 g Oral BID   potassium chloride  40 mEq Oral Once   rosuvastatin  20 mg Oral Daily   senna-docusate  1 tablet Oral BID   tamsulosin  0.4 mg Oral Daily   Continuous Infusions:  magnesium sulfate bolus IVPB     PRN Meds:.acetaminophen  Antibiotics  :    Anti-infectives (From admission, onward)    None         Objective:   Vitals:   05/08/23 2307 05/09/23 0000 05/09/23 0400 05/09/23 0712  BP: (!) 154/71 134/71 (!) 157/80 (!) 153/78  Pulse: 94 64 79 78  Resp: 14    20  Temp: 98.6 F (37 C)  99.3 F (37.4 C) 98.1 F (36.7 C)  TempSrc: Oral  Oral Oral  SpO2: 93% 90% 92% 95%  Weight:      Height:        Wt Readings from Last 3 Encounters:  05/08/23 78.9 kg  02/25/22 84.6 kg  02/20/21 86.5 kg     Intake/Output Summary (Last 24 hours) at 05/09/2023 0938 Last data filed at 05/09/2023 0600 Gross per 24 hour  Intake 200 ml  Output 1500 ml  Net -1300 ml     Physical Exam  Awake Alert, No new F.N deficits, Normal affect [place].AT,PERRAL Supple Neck, No JVD,   Symmetrical Chest wall movement, Good air movement bilaterally, CTAB RRR,No Gallops,Rubs or new Murmurs,  +ve B.Sounds, Abd Soft, No tenderness,   No Cyanosis, Clubbing or edema        Data Review:    Recent Labs  Lab 05/08/23 0024 05/08/23 1436 05/09/23 0550  WBC 6.3 6.8 6.8  HGB 10.2* 10.7* 10.8*  HCT 32.8* 34.0* 34.4*  PLT 134* 213 168  MCV 85.2 83.7 84.9  MCH 26.5 26.4 26.7  MCHC 31.1 31.5 31.4  RDW 15.7* 15.8* 15.5  LYMPHSABS 0.4* 0.6* 0.6*  MONOABS 0.5 0.6 0.7  EOSABS 0.0 0.0 0.0  BASOSABS 0.0 0.0 0.0    Recent  Labs  Lab 05/08/23 0024 05/08/23 1436 05/09/23 0550  NA 141 141 139  K 3.5 3.2* 3.5  CL 105 108 104  CO2 23 23 23   ANIONGAP 13 10 12   GLUCOSE 120* 95 104*  BUN 18 18 17   CREATININE 1.30* 1.05 1.11  AST 59* 81*  --   ALT 15 18  --   ALKPHOS 55 47  --   BILITOT 0.6 0.7  --   ALBUMIN 3.4* 3.2*  --   TSH  --  1.213  --   HGBA1C  --  8.2*  --   BNP  --   --  80.0  MG  --  1.8 1.9  CALCIUM 9.0 8.8* 8.8*      Recent Labs  Lab 05/08/23 0024 05/08/23 1436 05/09/23 0550  TSH  --  1.213  --   HGBA1C  --  8.2*  --   BNP  --   --  80.0  MG  --  1.8 1.9  CALCIUM 9.0 8.8* 8.8*    --------------------------------------------------------------------------------------------------------------- Lab Results  Component Value Date   CHOL 121 04/08/2022   HDL 27 (L) 04/08/2022   LDLCALC 63 04/08/2022   TRIG 267 (H) 04/08/2022   CHOLHDL 4.5  04/08/2022    Lab Results  Component Value Date   HGBA1C 8.2 (H) 05/08/2023   Recent Labs    05/08/23 1436  TSH 1.213   Recent Labs    05/08/23 1436  VITAMINB12 109*  FOLATE 15.3  FERRITIN 64  TIBC 316  IRON 15*  RETICCTPCT 1.0   ------------------------------------------------------------------------------------------------------------------ Cardiac Enzymes No results for input(s): "CKMB", "TROPONINI", "MYOGLOBIN" in the last 168 hours.  Invalid input(s): "CK"  Micro Results Recent Results (from the past 240 hours)  Resp panel by RT-PCR (RSV, Flu A&B, Covid) Anterior Nasal Swab     Status: Abnormal   Collection Time: 05/08/23 12:36 AM   Specimen: Anterior Nasal Swab  Result Value Ref Range Status   SARS Coronavirus 2 by RT PCR NEGATIVE NEGATIVE Final   Influenza A by PCR POSITIVE (A) NEGATIVE Final   Influenza B by PCR NEGATIVE NEGATIVE Final    Comment: (NOTE) The Xpert Xpress SARS-CoV-2/FLU/RSV plus assay is intended as an aid in the diagnosis of influenza from Nasopharyngeal swab specimens and should not be used as a sole basis for treatment. Nasal washings and aspirates are unacceptable for Xpert Xpress SARS-CoV-2/FLU/RSV testing.  Fact Sheet for Patients: BloggerCourse.com  Fact Sheet for Healthcare Providers: SeriousBroker.it  This test is not yet approved or cleared by the [place] FDA and has been authorized for detection and/or diagnosis of SARS-CoV-2 by FDA under an Emergency Use Authorization (EUA). This EUA will remain in effect (meaning this test can be used) for the duration of the COVID-19 declaration under Section 564(b)(1) of the Act, 21 U.S.C. section 360bbb-3(b)(1), unless the authorization is terminated or revoked.     Resp Syncytial Virus by PCR NEGATIVE NEGATIVE Final    Comment: (NOTE) Fact Sheet for Patients: BloggerCourse.com  Fact Sheet for  Healthcare Providers: SeriousBroker.it  This test is not yet approved or cleared by the [place] FDA and has been authorized for detection and/or diagnosis of SARS-CoV-2 by FDA under an Emergency Use Authorization (EUA). This EUA will remain in effect (meaning this test can be used) for the duration of the COVID-19 declaration under Section 564(b)(1) of the Act, 21 U.S.C. section 360bbb-3(b)(1), unless the authorization is terminated or revoked.  Performed at [facility],  [address] [place], [place] [address]     Radiology Report DG Pelvis 1-2 Views Result Date: 05/08/2023 CLINICAL DATA:  History of recent falls. EXAM: PELVIS - 1-2 VIEW COMPARISON:  PET-CT dated 04/07/2023. FINDINGS: There is no evidence of acute pelvic fracture or diastasis. No pelvic bone lesions are seen. Sacroiliac joints and pubic symphysis are anatomically aligned. Fiducial markers within the prostate gland. IMPRESSION: No acute osseous abnormality identified. Electronically Signed   By: Hart Robinsons M.D.   On: 05/08/2023 09:00   CT Head Wo Contrast Result Date: 05/08/2023 CLINICAL DATA:  Recent head trauma with headaches, initial encounter EXAM: CT HEAD WITHOUT CONTRAST TECHNIQUE: Contiguous axial images were obtained from the base of the skull through the vertex without intravenous contrast. RADIATION DOSE REDUCTION: This exam was performed according to the departmental dose-optimization program which includes automated exposure control, adjustment of the mA and/or kV according to patient size and/or use of iterative reconstruction technique. COMPARISON:  08/23/2015 FINDINGS: Brain: No evidence of acute infarction, hemorrhage, hydrocephalus, extra-axial collection or mass lesion/mass effect. Chronic atrophic and white matter ischemic changes are again seen. 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 Vascular: No hyperdense vessel or unexpected calcification.  Skull: Normal. Negative for fracture or focal lesion. Sinuses/Orbits: No acute finding. Other: None. IMPRESSION: Chronic atrophic and ischemic changes as described. Electronically Signed   By: Alcide Clever M.D.   On: 05/08/2023 03:39   DG Chest Portable 1 View Result Date: 05/08/2023 CLINICAL DATA:  Cough. EXAM: PORTABLE CHEST 1 VIEW COMPARISON:  Chest x-ray 04/16/2018 FINDINGS: Cardiac silhouette is mildly enlarged. The lungs are clear. There is no pleural effusion or pneumothorax. No acute fractures are seen. IMPRESSION: Mildly enlarged cardiac silhouette. No acute cardiopulmonary process. Electronically Signed   By: Darliss Cheney M.D.   On: 05/08/2023 02:36     Signature  -   Susa Raring M.D on 05/09/2023 at 9:38 AM   -  To page go to [email or web address]

## 2023-05-10 DIAGNOSIS — R531 Weakness: Secondary | ICD-10-CM | POA: Diagnosis not present

## 2023-05-10 LAB — GLUCOSE, CAPILLARY
Glucose-Capillary: 124 mg/dL — ABNORMAL HIGH (ref 70–99)
Glucose-Capillary: 133 mg/dL — ABNORMAL HIGH (ref 70–99)
Glucose-Capillary: 107 mg/dL — ABNORMAL HIGH (ref 70–99)
Glucose-Capillary: 145 mg/dL — ABNORMAL HIGH (ref 70–99)

## 2023-05-10 LAB — VAS US ABI WITH/WO TBI
Left ABI: 1.15
Right ABI: 1.14

## 2023-05-10 MED ORDER — ISOSORBIDE MONONITRATE ER 30 MG PO TB24
30.0000 mg | ORAL_TABLET | Freq: Every day | ORAL | Status: DC
  Administered 2023-05-10 – 2023-05-12 (×3): 30 mg via ORAL
  Filled 2023-05-10 (×3): qty 1

## 2023-05-10 NOTE — TOC Initial Note (Signed)
 Transition of Care [facility]) - Initial/Assessment Note   Patient Details  Name: Peter Mann MRN: [id number] Date of Birth: 16-Jan-1941  [facility]) CM/SW Contact:    Peter Kelp, LCSW Phone Number: 05/10/2023, 11:21 AM  Clinical Narrative:                 CSW followed-up disposition recommendations (SNF placement).  CSW completed initial TOC work-up/assessment as noted by the following below.   CSW spoke with the patient at the bedside to review SNF referral process per clinical recommendations and assessed the pt's SNF preference.   The patient expressed he would rather go home.   The CSW contacted the patient's natural support Peter Mann [phone]) and was unable to make contact with her.   CSW referral efforts to support the patient's disposition FL2:  PASRR:  SNF referrals:   Note: Please contact the Peter Mann -- daughter to review the patent's disposition. He lives alone and wants to go home and not go to a facility.   TOC Disposition follow-up needs  Please provide the patient or natural support with bed-offer updates.  Please continue with SNF placement efforts. Please update the clinical team to SNF placement efforts:  No other needs identified by this Clinical research associate currently. Patient needs and current disposition to be followed by    Expected Discharge Plan: Skilled Nursing Facility Barriers to Discharge: Continued Medical Work up  Patient Goals and CMS Choice Patient states their goals for this hospitalization and ongoing recovery are:: To get better   Choice offered to / list presented to : Patient      Expected Discharge Plan and Services In-house Referral: Clinical Social Work   Post Acute Care Choice: Skilled Nursing Facility Living arrangements for the past 2 months: Single Family Home                                      Prior Living Arrangements/Services Living arrangements for the past 2 months: Single Family  Home Lives with:: Self   Do you feel safe going back to the place where you live?: Yes      Need for Family Participation in Patient Care: Yes (Comment) Care giver support system in place?: Yes (comment) Current home services: DME Criminal Activity/Legal Involvement Pertinent to Current Situation/Hospitalization: No - Comment as needed  Activities of Daily Living   ADL Screening (condition at time of admission) Independently performs ADLs?: Yes (appropriate for developmental age) Is the patient deaf or have difficulty hearing?: No Does the patient have difficulty seeing, even when wearing glasses/contacts?: No Does the patient have difficulty concentrating, remembering, or making decisions?: No  Permission Sought/Granted Permission sought to share information with : Case Manager, Family Supports Permission granted to share information with : Yes, Verbal Permission Granted  Share Information with NAME: Peter Mann     Permission granted to share info w Relationship: Family member     Emotional Assessment       Orientation: : Oriented to Self, Oriented to Place, Oriented to  Time, Oriented to Situation   Psych Involvement: No (comment)  Admission diagnosis:  Weakness [R53.1] Influenza A [J10.1] Generalized weakness [R53.1] Patient Active Problem List   Diagnosis Date Noted   Weakness 05/08/2023   History of stroke 05/08/2023   Anemia 05/08/2023   Thrombocytopenia (HCC) 05/08/2023   Fall at home, initial encounter 05/08/2023   Influenza A 05/08/2023  Constipation 05/08/2023   BPH (benign prostatic hyperplasia) 05/08/2023   Pseudophakia of left eye 05/22/2021   Nuclear sclerotic cataract of right eye 02/15/2020   Exudative age-related macular degeneration of left eye with active choroidal neovascularization (HCC) 07/11/2019   Serous detachment of retinal pigment epithelium of left eye 07/11/2019   Posterior vitreous detachment of right eye 07/11/2019   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 07/11/2019   Posterior vitreous detachment of left eye 07/11/2019   Smoker 03/17/2016   Facial palsy    Acute CVA (cerebrovascular accident) (HCC)    HLD (hyperlipidemia)    Diabetes mellitus type 2, noninsulin dependent (HCC)    CVA (cerebral vascular accident) (HCC) 08/23/2015   Abdominal distention    Gangrenous cholecystitis    Ileus (HCC)    Abdominal pain 01/28/2014   Cholelithiasis 01/28/2014   Cholecystitis 01/28/2014   Symptomatic cholelithiasis 01/28/2014   Hyperglycemia 01/28/2014   Chest pain 02/01/2013   Essential hypertension 02/01/2013   Hyperlipidemia 02/01/2013   PCP:  Peter Contras, MD Pharmacy:   [facility] [phone] [place], [place] - [address] [place] [address] Phone: [phone] Fax: [phone]     Social Drivers of Health (SDOH) Social History: SDOH Screenings   Food Insecurity: No Food Insecurity (05/08/2023)  Housing: Low Risk  (05/08/2023)  Transportation Needs: No Transportation Needs (05/08/2023)  Utilities: Not At Risk (05/08/2023)  Social Connections: Patient Declined (05/08/2023)  Tobacco Use: Medium Risk (05/08/2023)   SDOH Interventions:     Readmission Risk Interventions     No data to display

## 2023-05-10 NOTE — Plan of Care (Signed)

## 2023-05-10 NOTE — Progress Notes (Signed)
 PROGRESS NOTE                                                                                                                                                                                                             Patient Demographics:    Peter Mann, is a 83 y.o. male, DOB - 12/14/1940, ZHY:[id number]  Outpatient Primary MD for the patient is Fleet Contras, MD    LOS - 2  Admit date - 05/07/2023    Chief Complaint  Patient presents with   Cough       Brief Narrative (HPI from H&P)   83 y.o. male with history of diabetes mellitus type 2, hypertension, chronic kidney disease stage III, chronic anemia, history of stroke and prior history of prostate cancer was brought to the ER after patient had a fall at home.   Patient states he has been having constant cough for the last 3 weeks and had gone to the urgent care day prior to admission.  Was treated symptomatically and after reaching home he had two more falls.  After the second fall he alerted the emergency care and was brought to the ER.  Patient denies any chest pain shortness of breath but has been having cough.  He has been experiencing some lower extremity pain.     Subjective:   Patient in bed, appears comfortable, denies any headache, no fever, no chest pain or pressure, no shortness of breath , no abdominal pain. No new focal weakness.   Assessment  & Plan :    Recent influenza A infection causing decreased oral intake, dehydration, generalized weakness and falls. -Has had influenza infection diagnosed about 2 weeks ago, currently not a Tamiflu candidate, due to poor oral intake has become dehydrated and weak with multiple falls at home, hydrated with IV fluids, PT OT, qualifies for SNF, however wants to go home tomorrow with home PT discussed with daughter, will continue to increase activity and work with PT.  Continue supportive care, head CT nonacute, no  headache or focal deficits.   Constipation - Patient endorses he usually has bowel movements once every 1 to 2 weeks, placed on bowel regimen.  BPH (benign prostatic hyperplasia) - Continue Flomax  Thrombocytopenia (HCC) - suspect from acute illness - trend as needed  Anemia - Considered in setting of anemia of chronic  disease  History of stroke Continue aspirin and statin  Diabetes mellitus type 2, noninsulin dependent (HCC) - Continue SSI and CBG monitoring  Hyperlipidemia - Continue statin  Essential hypertension - Continue amlodipine, metoprolol, add low-dose Imdur for better blood pressure control.  As needed hydralazine.       Condition - Fair  Family Communication  :  daughter Babette Relic [phone]  on 05/10/23  Code Status :  Full  Consults  :  None  PUD Prophylaxis : PPI   Procedures  :     CT head.  Nonacute      Disposition Plan  :    Status is: Inpatient   DVT Prophylaxis  :    enoxaparin (LOVENOX) injection 40 mg Start: 05/08/23 1000    Lab Results  Component Value Date   PLT 168 05/09/2023    Diet :  Diet Order             Diet heart healthy/carb modified Room service appropriate? Yes; Fluid consistency: Thin  Diet effective now                    Inpatient Medications  Scheduled Meds:  amLODipine  10 mg Oral Daily   aspirin  325 mg Oral Daily   cycloSPORINE  1 drop Left Eye Daily   docusate sodium  200 mg Oral BID   enoxaparin (LOVENOX) injection  40 mg Subcutaneous Q24H   insulin aspart  0-9 Units Subcutaneous TID WC   lactulose  30 g Oral TID   linaclotide  290 mcg Oral Daily   metoprolol tartrate  25 mg Oral BID   pantoprazole  40 mg Oral Daily   polyethylene glycol  17 g Oral BID   rosuvastatin  20 mg Oral Daily   senna-docusate  1 tablet Oral BID   tamsulosin  0.4 mg Oral Daily   Continuous Infusions:   PRN Meds:.acetaminophen, hydrALAZINE  Antibiotics  :    Anti-infectives (From admission, onward)     None         Objective:   Vitals:   05/09/23 0712 05/09/23 2031 05/10/23 0000 05/10/23 0819  BP: (!) 153/78 (!) 147/74 (!) 161/63 (!) 156/78  Pulse: 78 80 78 69  Resp: 20 19 (!) 23 20  Temp: 98.1 F (36.7 C) (!) 97.4 F (36.3 C) 97.7 F (36.5 C) 98 F (36.7 C)  TempSrc: Oral Oral Oral Oral  SpO2: 95% 94% 91% 92%  Weight:      Height:        Wt Readings from Last 3 Encounters:  05/08/23 78.9 kg  02/25/22 84.6 kg  02/20/21 86.5 kg    No intake or output data in the 24 hours ending 05/10/23 0929    Physical Exam  Awake Alert, No new F.N deficits, Normal affect [place].AT,PERRAL Supple Neck, No JVD,   Symmetrical Chest wall movement, Good air movement bilaterally, CTAB RRR,No Gallops,Rubs or new Murmurs,  +ve B.Sounds, Abd Soft, No tenderness,   No Cyanosis, Clubbing or edema        Data Review:    Recent Labs  Lab 05/08/23 0024 05/08/23 1436 05/09/23 0550  WBC 6.3 6.8 6.8  HGB 10.2* 10.7* 10.8*  HCT 32.8* 34.0* 34.4*  PLT 134* 213 168  MCV 85.2 83.7 84.9  MCH 26.5 26.4 26.7  MCHC 31.1 31.5 31.4  RDW 15.7* 15.8* 15.5  LYMPHSABS 0.4* 0.6* 0.6*  MONOABS 0.5 0.6 0.7  EOSABS 0.0 0.0 0.0  BASOSABS 0.0  0.0 0.0    Recent Labs  Lab 05/08/23 0024 05/08/23 1436 05/09/23 0550  NA 141 141 139  K 3.5 3.2* 3.5  CL 105 108 104  CO2 23 23 23   ANIONGAP 13 10 12   GLUCOSE 120* 95 104*  BUN 18 18 17   CREATININE 1.30* 1.05 1.11  AST 59* 81*  --   ALT 15 18  --   ALKPHOS 55 47  --   BILITOT 0.6 0.7  --   ALBUMIN 3.4* 3.2*  --   TSH  --  1.213  --   HGBA1C  --  8.2*  --   BNP  --   --  80.0  MG  --  1.8 1.9  CALCIUM 9.0 8.8* 8.8*      Recent Labs  Lab 05/08/23 0024 05/08/23 1436 05/09/23 0550  TSH  --  1.213  --   HGBA1C  --  8.2*  --   BNP  --   --  80.0  MG  --  1.8 1.9  CALCIUM 9.0 8.8* 8.8*    --------------------------------------------------------------------------------------------------------------- Lab Results  Component Value Date    CHOL 121 04/08/2022   HDL 27 (L) 04/08/2022   LDLCALC 63 04/08/2022   TRIG 267 (H) 04/08/2022   CHOLHDL 4.5 04/08/2022    Lab Results  Component Value Date   HGBA1C 8.2 (H) 05/08/2023   Recent Labs    05/08/23 1436  TSH 1.213   Recent Labs    05/08/23 1436  VITAMINB12 109*  FOLATE 15.3  FERRITIN 64  TIBC 316  IRON 15*  RETICCTPCT 1.0   ------------------------------------------------------------------------------------------------------------------ Cardiac Enzymes No results for input(s): "CKMB", "TROPONINI", "MYOGLOBIN" in the last 168 hours.  Invalid input(s): "CK"  Micro Results Recent Results (from the past 240 hours)  Resp panel by RT-PCR (RSV, Flu A&B, Covid) Anterior Nasal Swab     Status: Abnormal   Collection Time: 05/08/23 12:36 AM   Specimen: Anterior Nasal Swab  Result Value Ref Range Status   SARS Coronavirus 2 by RT PCR NEGATIVE NEGATIVE Final   Influenza A by PCR POSITIVE (A) NEGATIVE Final   Influenza B by PCR NEGATIVE NEGATIVE Final    Comment: (NOTE) The Xpert Xpress SARS-CoV-2/FLU/RSV plus assay is intended as an aid in the diagnosis of influenza from Nasopharyngeal swab specimens and should not be used as a sole basis for treatment. Nasal washings and aspirates are unacceptable for Xpert Xpress SARS-CoV-2/FLU/RSV testing.  Fact Sheet for Patients: BloggerCourse.com  Fact Sheet for Healthcare Providers: SeriousBroker.it  This test is not yet approved or cleared by the [place] FDA and has been authorized for detection and/or diagnosis of SARS-CoV-2 by FDA under an Emergency Use Authorization (EUA). This EUA will remain in effect (meaning this test can be used) for the duration of the COVID-19 declaration under Section 564(b)(1) of the Act, 21 U.S.C. section 360bbb-3(b)(1), unless the authorization is terminated or revoked.     Resp Syncytial Virus by PCR NEGATIVE NEGATIVE Final     Comment: (NOTE) Fact Sheet for Patients: BloggerCourse.com  Fact Sheet for Healthcare Providers: SeriousBroker.it  This test is not yet approved or cleared by the [place] FDA and has been authorized for detection and/or diagnosis of SARS-CoV-2 by FDA under an Emergency Use Authorization (EUA). This EUA will remain in effect (meaning this test can be used) for the duration of the COVID-19 declaration under Section 564(b)(1) of the Act, 21 U.S.C. section 360bbb-3(b)(1), unless the authorization is terminated or revoked.  Performed at [facility], [address], [place], [place] [address]     Radiology Report VAS [place] ABI WITH/WO TBI Result Date: 05/09/2023  LOWER EXTREMITY DOPPLER STUDY Patient Name:  LATAVIOUS BITTER  Date of Exam:   05/09/2023 Medical Rec #: [id number]      Accession #:    [id number] Date of Birth: [birth year]      Patient Gender: M Patient Age:   84 years Exam Location:  [facility] Procedure:      VAS [place] ABI WITH/WO TBI Referring Phys: Midge Minium --------------------------------------------------------------------------------  Indications: Rest pain. High Risk Factors: Hypertension, Diabetes, past history of smoking, prior CVA. Other Factors: Recent injury/fall.  Comparison Study: No prior exam. Performing Technologist: Fernande Bras  Examination Guidelines: A complete evaluation includes at minimum, Doppler waveform signals and systolic blood pressure reading at the level of bilateral brachial, anterior tibial, and posterior tibial arteries, when vessel segments are accessible. Bilateral testing is considered an integral part of a complete examination. Photoelectric Plethysmograph (PPG) waveforms and toe systolic pressure readings are included as required and additional duplex testing as needed. Limited examinations for reoccurring indications may be performed as noted.  ABI Findings:  +---------+------------------+-----+---------+--------+ Right    Rt Pressure (mmHg)IndexWaveform Comment  +---------+------------------+-----+---------+--------+ Brachial 138                    triphasic         +---------+------------------+-----+---------+--------+ PTA      157               1.14 triphasic         +---------+------------------+-----+---------+--------+ DP       138               1.00 biphasic          +---------+------------------+-----+---------+--------+ Great Toe118               0.86 Normal            +---------+------------------+-----+---------+--------+ +---------+------------------+-----+---------+-------+ Left     Lt Pressure (mmHg)IndexWaveform Comment +---------+------------------+-----+---------+-------+ Brachial 135                    triphasic        +---------+------------------+-----+---------+-------+ PTA      151               1.09 triphasic        +---------+------------------+-----+---------+-------+ DP       159               1.15 triphasic        +---------+------------------+-----+---------+-------+ Great Toe127               0.92 Normal           +---------+------------------+-----+---------+-------+ +-------+-----------+-----------+------------+------------+ ABI/TBIToday's ABIToday's TBIPrevious ABIPrevious TBI +-------+-----------+-----------+------------+------------+ Right  1.14       0.86                                +-------+-----------+-----------+------------+------------+ Left   1.15       0.92                                +-------+-----------+-----------+------------+------------+   Summary: Right: Resting right ankle-brachial index is within normal range. The right toe-brachial index is normal. Left: Resting left ankle-brachial index is within normal range. The left toe-brachial  index is normal. *See table(s) above for measurements and observations.     Preliminary      Signature   -   Susa Raring M.D on 05/10/2023 at 9:29 AM   -  To page go to [email or web address]

## 2023-05-10 NOTE — NC FL2 (Signed)
 [place] MEDICAID FL2 LEVEL OF CARE FORM     IDENTIFICATION  Patient Name: Peter Mann Birthdate: 03-06-1941 Sex: male Admission Date (Current Location): 05/07/2023  [facility] and [place] Number:  Producer, television/film/video and Address:         Provider Number: [id number]  Attending Physician Name and Address:  Leroy Sea, MD  Relative Name and Phone Number:       Current Level of Care: Hospital Recommended Level of Care: Skilled Nursing Facility Prior Approval Number:    Date Approved/Denied: 05/10/23 PASRR Number: [id number]  Discharge Plan: SNF    Current Diagnoses: Patient Active Problem List   Diagnosis Date Noted   Weakness 05/08/2023   History of stroke 05/08/2023   Anemia 05/08/2023   Thrombocytopenia (HCC) 05/08/2023   Fall at home, initial encounter 05/08/2023   Influenza A 05/08/2023   Constipation 05/08/2023   BPH (benign prostatic hyperplasia) 05/08/2023   Pseudophakia of left eye 05/22/2021   Nuclear sclerotic cataract of right eye 02/15/2020   Exudative age-related macular degeneration of left eye with active choroidal neovascularization (HCC) 07/11/2019   Serous detachment of retinal pigment epithelium of left eye 07/11/2019   Posterior vitreous detachment of right eye 07/11/2019   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 07/11/2019   Posterior vitreous detachment of left eye 07/11/2019   Smoker 03/17/2016   Facial palsy    Acute CVA (cerebrovascular accident) (HCC)    HLD (hyperlipidemia)    Diabetes mellitus type 2, noninsulin dependent (HCC)    CVA (cerebral vascular accident) (HCC) 08/23/2015   Abdominal distention    Gangrenous cholecystitis    Ileus (HCC)    Abdominal pain 01/28/2014   Cholelithiasis 01/28/2014   Cholecystitis 01/28/2014   Symptomatic cholelithiasis 01/28/2014   Hyperglycemia 01/28/2014   Chest pain 02/01/2013   Essential hypertension 02/01/2013   Hyperlipidemia 02/01/2013     Orientation RESPIRATION BLADDER Height & Weight     Time, Self, Situation, Place  Normal Incontinent Weight: 174 lb (78.9 kg) Height:  5\' 7"  (170.2 cm)  BEHAVIORAL SYMPTOMS/MOOD NEUROLOGICAL BOWEL NUTRITION STATUS      Continent Diet  AMBULATORY STATUS COMMUNICATION OF NEEDS Skin   Limited Assist Verbally                         Personal Care Assistance Level of Assistance  Dressing     Dressing Assistance: Limited assistance     Functional Limitations Info             SPECIAL CARE FACTORS FREQUENCY  PT (By licensed PT), OT (By licensed OT)     PT Frequency: 5x OT Frequency: 3x            Contractures Contractures Info: Not present    Additional Factors Info  Code Status Code Status Info: Full             Current Medications (05/10/2023):  This is the current hospital active medication list Current Facility-Administered Medications  Medication Dose Route Frequency Provider Last Rate Last Admin   acetaminophen (TYLENOL) tablet 1,000 mg  1,000 mg Oral Q6H PRN Eduard Clos, MD   1,000 mg at 05/08/23 2307   amLODipine (NORVASC) tablet 10 mg  10 mg Oral Daily Eduard Clos, MD   10 mg at 05/10/23 4782   aspirin tablet 325 mg  325 mg Oral Daily Eduard Clos, MD   325 mg at 05/10/23 9562   cycloSPORINE (  RESTASIS) 0.05 % ophthalmic emulsion 1 drop  1 drop Left Eye Daily Eduard Clos, MD   1 drop at 05/10/23 6578   docusate sodium (COLACE) capsule 200 mg  200 mg Oral BID Leroy Sea, MD   200 mg at 05/09/23 1129   enoxaparin (LOVENOX) injection 40 mg  40 mg Subcutaneous Q24H Eduard Clos, MD   40 mg at 05/10/23 4696   hydrALAZINE (APRESOLINE) injection 10 mg  10 mg Intravenous Q6H PRN Leroy Sea, MD       insulin aspart (novoLOG) injection 0-9 Units  0-9 Units Subcutaneous TID WC Eduard Clos, MD   1 Units at 05/10/23 1222   isosorbide mononitrate (IMDUR) 24 hr tablet 30 mg  30 mg Oral Daily Susa Raring K, MD   30 mg at 05/10/23 1029   lactulose (CHRONULAC) 10 GM/15ML solution 30 g  30 g Oral TID Leroy Sea, MD   30 g at 05/09/23 1129   linaclotide (LINZESS) capsule 290 mcg  290 mcg Oral Daily Lewie Chamber, MD   290 mcg at 05/10/23 0854   metoprolol tartrate (LOPRESSOR) tablet 25 mg  25 mg Oral BID Eduard Clos, MD   25 mg at 05/10/23 0852   pantoprazole (PROTONIX) EC tablet 40 mg  40 mg Oral Daily Eduard Clos, MD   40 mg at 05/10/23 2952   polyethylene glycol (MIRALAX / GLYCOLAX) packet 17 g  17 g Oral BID Lewie Chamber, MD   17 g at 05/09/23 0906   rosuvastatin (CRESTOR) tablet 20 mg  20 mg Oral Daily Eduard Clos, MD   20 mg at 05/10/23 8413   senna-docusate (Senokot-S) tablet 1 tablet  1 tablet Oral BID Lewie Chamber, MD   1 tablet at 05/10/23 2440   tamsulosin (FLOMAX) capsule 0.4 mg  0.4 mg Oral Daily Eduard Clos, MD   0.4 mg at 05/10/23 1027     Discharge Medications: Please see discharge summary for a list of discharge medications.  Relevant Imaging Results:  Relevant Lab Results:   Additional Information    Helene Kelp, LCSW

## 2023-05-11 DIAGNOSIS — R531 Weakness: Secondary | ICD-10-CM | POA: Diagnosis not present

## 2023-05-11 LAB — GLUCOSE, CAPILLARY
Glucose-Capillary: 189 mg/dL — ABNORMAL HIGH (ref 70–99)
Glucose-Capillary: 126 mg/dL — ABNORMAL HIGH (ref 70–99)
Glucose-Capillary: 153 mg/dL — ABNORMAL HIGH (ref 70–99)
Glucose-Capillary: 177 mg/dL — ABNORMAL HIGH (ref 70–99)

## 2023-05-11 MED ORDER — ORAL CARE MOUTH RINSE
15.0000 mL | OROMUCOSAL | Status: DC | PRN
Start: 2023-05-11 — End: 2023-05-12

## 2023-05-11 NOTE — NC FL2 (Signed)
 [place] MEDICAID FL2 LEVEL OF CARE FORM     IDENTIFICATION  Patient Name: Peter Mann Birthdate: April 13, 1940 Sex: male Admission Date (Current Location): 05/07/2023  [facility] and [place] Number:  Producer, television/film/video and Address:  [facility] [place], [address] [place], [place] [address]      Provider Number: [id number]  Attending Physician Name and Address:  Leroy Sea, MD  Relative Name and Phone Number:       Current Level of Care: Hospital Recommended Level of Care: Skilled Nursing Facility Prior Approval Number:    Date Approved/Denied: 05/10/23 PASRR Number: [id number]  Discharge Plan: SNF    Current Diagnoses: Patient Active Problem List   Diagnosis Date Noted   Weakness 05/08/2023   History of stroke 05/08/2023   Anemia 05/08/2023   Thrombocytopenia (HCC) 05/08/2023   Fall at home, initial encounter 05/08/2023   Influenza A 05/08/2023   Constipation 05/08/2023   BPH (benign prostatic hyperplasia) 05/08/2023   Pseudophakia of left eye 05/22/2021   Nuclear sclerotic cataract of right eye 02/15/2020   Exudative age-related macular degeneration of left eye with active choroidal neovascularization (HCC) 07/11/2019   Serous detachment of retinal pigment epithelium of left eye 07/11/2019   Posterior vitreous detachment of right eye 07/11/2019   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 07/11/2019   Posterior vitreous detachment of left eye 07/11/2019   Smoker 03/17/2016   Facial palsy    Acute CVA (cerebrovascular accident) (HCC)    HLD (hyperlipidemia)    Diabetes mellitus type 2, noninsulin dependent (HCC)    CVA (cerebral vascular accident) (HCC) 08/23/2015   Abdominal distention    Gangrenous cholecystitis    Ileus (HCC)    Abdominal pain 01/28/2014   Cholelithiasis 01/28/2014   Cholecystitis 01/28/2014   Symptomatic cholelithiasis 01/28/2014   Hyperglycemia 01/28/2014   Chest pain  02/01/2013   Essential hypertension 02/01/2013   Hyperlipidemia 02/01/2013    Orientation RESPIRATION BLADDER Height & Weight     Time, Self, Situation, Place  Normal Continent, External catheter Weight: 174 lb (78.9 kg) Height:  5\' 7"  (170.2 cm)  BEHAVIORAL SYMPTOMS/MOOD NEUROLOGICAL BOWEL NUTRITION STATUS      Continent Diet (see dc summary)  AMBULATORY STATUS COMMUNICATION OF NEEDS Skin   Limited Assist Verbally Normal                       Personal Care Assistance Level of Assistance  Bathing, Feeding, Dressing Bathing Assistance: Limited assistance Feeding assistance: Limited assistance Dressing Assistance: Limited assistance     Functional Limitations Info             SPECIAL CARE FACTORS FREQUENCY  PT (By licensed PT), OT (By licensed OT)     PT Frequency: 5x OT Frequency: 3x            Contractures Contractures Info: Not present    Additional Factors Info  Code Status, Allergies, Isolation Precautions Code Status Info: Full Allergies Info: NKA     Isolation Precautions Info: Flu+ 05/08/23     Current Medications (05/11/2023):  This is the current hospital active medication list Current Facility-Administered Medications  Medication Dose Route Frequency Provider Last Rate Last Admin   acetaminophen (TYLENOL) tablet 1,000 mg  1,000 mg Oral Q6H PRN Eduard Clos, MD   1,000 mg at 05/08/23 2307   amLODipine (NORVASC) tablet 10 mg  10 mg Oral Daily Eduard Clos, MD   10 mg  at 05/10/23 0851   aspirin tablet 325 mg  325 mg Oral Daily Eduard Clos, MD   325 mg at 05/10/23 1610   cycloSPORINE (RESTASIS) 0.05 % ophthalmic emulsion 1 drop  1 drop Left Eye Daily Eduard Clos, MD   1 drop at 05/10/23 9604   docusate sodium (COLACE) capsule 200 mg  200 mg Oral BID Leroy Sea, MD   200 mg at 05/09/23 1129   enoxaparin (LOVENOX) injection 40 mg  40 mg Subcutaneous Q24H Eduard Clos, MD   40 mg at 05/10/23 5409    hydrALAZINE (APRESOLINE) injection 10 mg  10 mg Intravenous Q6H PRN Leroy Sea, MD       insulin aspart (novoLOG) injection 0-9 Units  0-9 Units Subcutaneous TID WC Eduard Clos, MD   1 Units at 05/10/23 1222   isosorbide mononitrate (IMDUR) 24 hr tablet 30 mg  30 mg Oral Daily Susa Raring K, MD   30 mg at 05/10/23 1029   lactulose (CHRONULAC) 10 GM/15ML solution 30 g  30 g Oral TID Leroy Sea, MD   30 g at 05/10/23 1741   linaclotide (LINZESS) capsule 290 mcg  290 mcg Oral Daily Lewie Chamber, MD   290 mcg at 05/10/23 0854   metoprolol tartrate (LOPRESSOR) tablet 25 mg  25 mg Oral BID Eduard Clos, MD   25 mg at 05/10/23 2035   Oral care mouth rinse  15 mL Mouth Rinse PRN Leroy Sea, MD       pantoprazole (PROTONIX) EC tablet 40 mg  40 mg Oral Daily Eduard Clos, MD   40 mg at 05/10/23 8119   polyethylene glycol (MIRALAX / GLYCOLAX) packet 17 g  17 g Oral BID Lewie Chamber, MD   17 g at 05/09/23 0906   rosuvastatin (CRESTOR) tablet 20 mg  20 mg Oral Daily Eduard Clos, MD   20 mg at 05/10/23 1478   senna-docusate (Senokot-S) tablet 1 tablet  1 tablet Oral BID Lewie Chamber, MD   1 tablet at 05/10/23 2956   tamsulosin (FLOMAX) capsule 0.4 mg  0.4 mg Oral Daily Eduard Clos, MD   0.4 mg at 05/10/23 2130     Discharge Medications: Please see discharge summary for a list of discharge medications.  Relevant Imaging Results:  Relevant Lab Results:   Additional Information SN: [id number] [place], [place]

## 2023-05-11 NOTE — Progress Notes (Signed)
 Physical Therapy Treatment Patient Details Name: Peter Mann MRN: [id number] DOB: January 27, 1941 Today's Date: 05/11/2023   History of Present Illness Pt is 83 yo male who presented to hospital on 05/07/23 after fall with weakness and coughing.  Pt with + influenza.  Pt with hx including but not limited to DM2, HTN, CKD 3, anemia, CVA, prostate CA    PT Comments  Seems to be making steady progress towards acute functional goals. Still with a bit of reduced safety awareness. Required up to CGA with mobility today. DGI indicates high fall risk, but is approaching low risk category. Still feel he would benefit from short term SNF but noted pt refusing; ideally, family would increase supervision for pt at home and have him use RW at all times but does not seem amenable to this idea (again with some reduced safety awareness.) Will continue to follow and progress during admission. If he decides to go home, would greatly benefit from HHPT follow-up.    If plan is discharge home, recommend the following: A little help with walking and/or transfers;A little help with bathing/dressing/bathroom;Assistance with cooking/housework;Help with stairs or ramp for entrance;Supervision due to cognitive status   Can travel by private vehicle     Yes  Equipment Recommendations  Rolling walker (2 wheels)    Recommendations for Other Services       Precautions / Restrictions Precautions Precautions: Fall Restrictions Weight Bearing Restrictions Per Provider Order: No     Mobility  Bed Mobility               General bed mobility comments: Sitting EOB end of session. alarm on.    Transfers Overall transfer level: Needs assistance Equipment used: Rolling walker (2 wheels) Transfers: Sit to/from Stand Sit to Stand: Supervision           General transfer comment: Supervision for safety and to manage lines/leads. Stable with RW for support.    Ambulation/Gait Ambulation/Gait assistance: Contact  guard assist Gait Distance (Feet): 225 Feet Assistive device: Rolling walker (2 wheels), None Gait Pattern/deviations: Step-through pattern, Decreased stride length, Trunk flexed, Festinating, Shuffle Gait velocity: normal Gait velocity interpretation: 1.31 - 2.62 ft/sec, indicative of limited community ambulator   General Gait Details: Shuffled gait, festinating at times. Progressed off RW for short duration. More stable with RW but states he will unlikely utilize after d/c *(poor safety awareness.) While ambulating without AD pt challenged with dynamic tasks showing intermittent instability requiring CGA for safety.   Stairs             Wheelchair Mobility     Tilt Bed    Modified Rankin (Stroke Patients Only)       Balance Overall balance assessment: Needs assistance Sitting-balance support: No upper extremity supported Sitting balance-Leahy Scale: Good     Standing balance support: No upper extremity supported Standing balance-Leahy Scale: Fair                   Standardized Balance Assessment Standardized Balance Assessment : Dynamic Gait Index   Dynamic Gait Index Level Surface: Mild Impairment Change in Gait Speed: Normal Gait with Horizontal Head Turns: Normal Gait with Vertical Head Turns: Normal Gait and Pivot Turn: Normal Step Over Obstacle: Moderate Impairment Step Around Obstacles: Mild Impairment Steps: Mild Impairment Total Score: 19      Communication Communication Communication: Impaired Factors Affecting Communication: Hearing impaired;Reduced clarity of speech  Cognition Arousal: Alert Behavior During Therapy: Impulsive   PT - Cognitive impairments: No family/caregiver present  to determine baseline, Problem solving, Safety/Judgement, Awareness                       PT - Cognition Comments: A little impulsive, negligent of lines/leads with transfers, cues for awareness. Following commands: Intact      Cueing Cueing  Techniques: Verbal cues  Exercises      General Comments General comments (skin integrity, edema, etc.): SpO2 92% and greater on RA during bout, minimal to no dyspnea.      Pertinent Vitals/Pain Pain Assessment Pain Assessment: No/denies pain    Home Living                          Prior Function            PT Goals (current goals can now be found in the care plan section) Acute Rehab PT Goals Patient Stated Goal: return home PT Goal Formulation: With patient Time For Goal Achievement: 05/22/23 Potential to Achieve Goals: Good Progress towards PT goals: Progressing toward goals    Frequency    Min 1X/week      PT Plan      Co-evaluation              AM-PAC PT "6 Clicks" Mobility   Outcome Measure  Help needed turning from your back to your side while in a flat bed without using bedrails?: A Little Help needed moving from lying on your back to sitting on the side of a flat bed without using bedrails?: A Little Help needed moving to and from a bed to a chair (including a wheelchair)?: A Little Help needed standing up from a chair using your arms (e.g., wheelchair or bedside chair)?: A Little Help needed to walk in hospital room?: A Little Help needed climbing 3-5 steps with a railing? : A Little 6 Click Score: 18    End of Session Equipment Utilized During Treatment: Gait belt Activity Tolerance: Patient tolerated treatment well Patient left: in bed;with call bell/phone within reach;with bed alarm set (EOB) Nurse Communication: Mobility status PT Visit Diagnosis: Other abnormalities of gait and mobility (R26.89);Muscle weakness (generalized) (M62.81)     Time: 5956-3875 PT Time Calculation (min) (ACUTE ONLY): 21 min  Charges:    $Gait Training: 8-22 mins PT General Charges $$ ACUTE PT VISIT: 1 Visit                     Kathlyn Sacramento, PT, DPT [facility]  Rehabilitation Services Physical Therapist Office: [phone] Website:  [email or web address]    Berton Mount 05/11/2023, 1:54 PM

## 2023-05-11 NOTE — Progress Notes (Signed)
 Mobility Specialist Progress Note:    05/11/23 1206  Mobility  Activity Ambulated with assistance in hallway  Level of Assistance Contact guard assist, steadying assist  Assistive Device Front wheel walker  Distance Ambulated (ft) 200 ft  Activity Response Tolerated well  Mobility Referral Yes  Mobility visit 1 Mobility  Mobility Specialist Start Time (ACUTE ONLY) 1020  Mobility Specialist Stop Time (ACUTE ONLY) 1033  Mobility Specialist Time Calculation (min) (ACUTE ONLY) 13 min   Pt received in chair agreeable to mobility. Pt required MinG throughout session d/t unsteadiness and pt would increase speed randomly causing him to be more unsteady. Returned to room w/o fault. Situated in chair w/ call bell and personal belongings in reach. All needs met. Chair alarm on.  Thompson Grayer Mobility Specialist  Please contact vis Secure Chat or  Rehab Office [phone]

## 2023-05-11 NOTE — TOC Progression Note (Signed)
 Transition of Care [facility]) - Progression Note    Patient Details  Name: Peter Mann MRN: [id number] Date of Birth: Aug 31, 1940  [facility]) CM/SW Contact  Leone Haven, RN Phone Number: 05/11/2023, 3:19 PM  Clinical Narrative:    NCM notified by CSW that patient does not want to go to SNF he wants to go home with [facility].  NCM went to room, patient gave NCM permission to speak with his daughter, Babette Relic,  offered choice for HHPT,  HHOT,  she states she does not have a preference.  NCM made referral to [facility] with [facility].  He is able to take referral with soc beginning on Friday.  Tammy states she will be the one transporting him at dc.  She wanted the [facility] phone number also, NCM assisted with that.     Expected Discharge Plan: Home w Home Health Services Barriers to Discharge: Continued Medical Work up  Expected Discharge Plan and Services In-house Referral: Clinical Social Work   Post Acute Care Choice: Skilled Nursing Facility Living arrangements for the past 2 months: Single Family Home                                       Social Determinants of Health (SDOH) Interventions SDOH Screenings   Food Insecurity: No Food Insecurity (05/08/2023)  Housing: Low Risk  (05/08/2023)  Transportation Needs: No Transportation Needs (05/08/2023)  Utilities: Not At Risk (05/08/2023)  Social Connections: Patient Declined (05/08/2023)  Tobacco Use: Medium Risk (05/08/2023)    Readmission Risk Interventions     No data to display

## 2023-05-11 NOTE — Progress Notes (Signed)
 PROGRESS NOTE                                                                                                                                                                                                             Patient Demographics:    Peter Mann, is a 83 y.o. male, DOB - September 28, 1940, ZOX:[id number]  Outpatient Primary MD for the patient is Fleet Contras, MD    LOS - 3  Admit date - 05/07/2023    Chief Complaint  Patient presents with   Cough       Brief Narrative (HPI from H&P)   83 y.o. male with history of diabetes mellitus type 2, hypertension, chronic kidney disease stage III, chronic anemia, history of stroke and prior history of prostate cancer was brought to the ER after patient had a fall at home.   Patient states he has been having constant cough for the last 3 weeks and had gone to the urgent care day prior to admission.  Was treated symptomatically and after reaching home he had two more falls.  After the second fall he alerted the emergency care and was brought to the ER.  Patient denies any chest pain shortness of breath but has been having cough.  He has been experiencing some lower extremity pain.     Subjective:   Patient in bed, appears comfortable, denies any headache, no fever, no chest pain or pressure, no shortness of breath , no abdominal pain. No new focal weakness.   Assessment  & Plan :    Recent influenza A infection causing decreased oral intake, dehydration, generalized weakness and falls. -Has had influenza infection diagnosed about 2 weeks ago, currently not a Tamiflu candidate, due to poor oral intake has become dehydrated and weak with multiple falls at home, hydrated with IV fluids, PT OT, SNF bed awaited, will continue to increase activity and work with PT.  Continue supportive care, head CT nonacute, no headache or focal deficits.  Constipation - Patient endorses he usually has  bowel movements once every 1 to 2 weeks, placed on bowel regimen.  BPH (benign prostatic hyperplasia) - Continue Flomax  Thrombocytopenia (HCC) - suspect from acute illness - trend as needed  Anemia - Considered in setting of anemia of chronic disease  History of stroke Continue aspirin and statin  Diabetes mellitus type  2, noninsulin dependent (HCC) - Continue SSI and CBG monitoring  Hyperlipidemia - Continue statin  Essential hypertension - Continue amlodipine, metoprolol, add low-dose Imdur for better blood pressure control.  As needed hydralazine.       Condition - Fair  Family Communication  :  daughter Babette Relic [phone]  on 05/10/23  Code Status :  Full  Consults  :  None  PUD Prophylaxis : PPI   Procedures  :     CT head.  Nonacute      Disposition Plan  :    Status is: Inpatient   DVT Prophylaxis  :    enoxaparin (LOVENOX) injection 40 mg Start: 05/08/23 1000    Lab Results  Component Value Date   PLT 168 05/09/2023    Diet :  Diet Order             Diet heart healthy/carb modified Room service appropriate? Yes; Fluid consistency: Thin  Diet effective now                    Inpatient Medications  Scheduled Meds:  amLODipine  10 mg Oral Daily   aspirin  325 mg Oral Daily   cycloSPORINE  1 drop Left Eye Daily   docusate sodium  200 mg Oral BID   enoxaparin (LOVENOX) injection  40 mg Subcutaneous Q24H   insulin aspart  0-9 Units Subcutaneous TID WC   isosorbide mononitrate  30 mg Oral Daily   linaclotide  290 mcg Oral Daily   metoprolol tartrate  25 mg Oral BID   pantoprazole  40 mg Oral Daily   polyethylene glycol  17 g Oral BID   rosuvastatin  20 mg Oral Daily   senna-docusate  1 tablet Oral BID   tamsulosin  0.4 mg Oral Daily   Continuous Infusions:   PRN Meds:.acetaminophen, hydrALAZINE, mouth rinse  Antibiotics  :    Anti-infectives (From admission, onward)    None         Objective:   Vitals:    05/10/23 2200 05/11/23 0000 05/11/23 0400 05/11/23 0800  BP:  135/63 134/76 (!) 156/75  Pulse: 67 64 (!) 57 65  Resp:  (!) 21 20 20   Temp:  98 F (36.7 C) 98.2 F (36.8 C) 99 F (37.2 C)  TempSrc:  Oral Oral Oral  SpO2: 94% 95% 94% 94%  Weight:      Height:        Wt Readings from Last 3 Encounters:  05/08/23 78.9 kg  02/25/22 84.6 kg  02/20/21 86.5 kg     Intake/Output Summary (Last 24 hours) at 05/11/2023 1016 Last data filed at 05/11/2023 0857 Gross per 24 hour  Intake 120 ml  Output 900 ml  Net -780 ml      Physical Exam  Awake Alert, No new F.N deficits, Normal affect [place].AT,PERRAL Supple Neck, No JVD,   Symmetrical Chest wall movement, Good air movement bilaterally, CTAB RRR,No Gallops,Rubs or new Murmurs,  +ve B.Sounds, Abd Soft, No tenderness,   No Cyanosis, Clubbing or edema        Data Review:    Recent Labs  Lab 05/08/23 0024 05/08/23 1436 05/09/23 0550  WBC 6.3 6.8 6.8  HGB 10.2* 10.7* 10.8*  HCT 32.8* 34.0* 34.4*  PLT 134* 213 168  MCV 85.2 83.7 84.9  MCH 26.5 26.4 26.7  MCHC 31.1 31.5 31.4  RDW 15.7* 15.8* 15.5  LYMPHSABS 0.4* 0.6* 0.6*  MONOABS 0.5 0.6 0.7  EOSABS 0.0  0.0 0.0  BASOSABS 0.0 0.0 0.0    Recent Labs  Lab 05/08/23 0024 05/08/23 1436 05/09/23 0550  NA 141 141 139  K 3.5 3.2* 3.5  CL 105 108 104  CO2 23 23 23   ANIONGAP 13 10 12   GLUCOSE 120* 95 104*  BUN 18 18 17   CREATININE 1.30* 1.05 1.11  AST 59* 81*  --   ALT 15 18  --   ALKPHOS 55 47  --   BILITOT 0.6 0.7  --   ALBUMIN 3.4* 3.2*  --   TSH  --  1.213  --   HGBA1C  --  8.2*  --   BNP  --   --  80.0  MG  --  1.8 1.9  CALCIUM 9.0 8.8* 8.8*      Recent Labs  Lab 05/08/23 0024 05/08/23 1436 05/09/23 0550  TSH  --  1.213  --   HGBA1C  --  8.2*  --   BNP  --   --  80.0  MG  --  1.8 1.9  CALCIUM 9.0 8.8* 8.8*    --------------------------------------------------------------------------------------------------------------- Lab Results  Component  Value Date   CHOL 121 04/08/2022   HDL 27 (L) 04/08/2022   LDLCALC 63 04/08/2022   TRIG 267 (H) 04/08/2022   CHOLHDL 4.5 04/08/2022    Lab Results  Component Value Date   HGBA1C 8.2 (H) 05/08/2023   Recent Labs    05/08/23 1436  TSH 1.213   Recent Labs    05/08/23 1436  VITAMINB12 109*  FOLATE 15.3  FERRITIN 64  TIBC 316  IRON 15*  RETICCTPCT 1.0   ------------------------------------------------------------------------------------------------------------------ Cardiac Enzymes No results for input(s): "CKMB", "TROPONINI", "MYOGLOBIN" in the last 168 hours.  Invalid input(s): "CK"  Micro Results Recent Results (from the past 240 hours)  Resp panel by RT-PCR (RSV, Flu A&B, Covid) Anterior Nasal Swab     Status: Abnormal   Collection Time: 05/08/23 12:36 AM   Specimen: Anterior Nasal Swab  Result Value Ref Range Status   SARS Coronavirus 2 by RT PCR NEGATIVE NEGATIVE Final   Influenza A by PCR POSITIVE (A) NEGATIVE Final   Influenza B by PCR NEGATIVE NEGATIVE Final    Comment: (NOTE) The Xpert Xpress SARS-CoV-2/FLU/RSV plus assay is intended as an aid in the diagnosis of influenza from Nasopharyngeal swab specimens and should not be used as a sole basis for treatment. Nasal washings and aspirates are unacceptable for Xpert Xpress SARS-CoV-2/FLU/RSV testing.  Fact Sheet for Patients: BloggerCourse.com  Fact Sheet for Healthcare Providers: SeriousBroker.it  This test is not yet approved or cleared by the [place] FDA and has been authorized for detection and/or diagnosis of SARS-CoV-2 by FDA under an Emergency Use Authorization (EUA). This EUA will remain in effect (meaning this test can be used) for the duration of the COVID-19 declaration under Section 564(b)(1) of the Act, 21 U.S.C. section 360bbb-3(b)(1), unless the authorization is terminated or revoked.     Resp Syncytial Virus by PCR NEGATIVE  NEGATIVE Final    Comment: (NOTE) Fact Sheet for Patients: BloggerCourse.com  Fact Sheet for Healthcare Providers: SeriousBroker.it  This test is not yet approved or cleared by the [place] FDA and has been authorized for detection and/or diagnosis of SARS-CoV-2 by FDA under an Emergency Use Authorization (EUA). This EUA will remain in effect (meaning this test can be used) for the duration of the COVID-19 declaration under Section 564(b)(1) of the Act, 21 U.S.C. section 360bbb-3(b)(1), unless the authorization  is terminated or revoked.  Performed at [facility], [address], [place], [place] [address]     Radiology Report VAS [place] ABI WITH/WO TBI Result Date: 05/10/2023  LOWER EXTREMITY DOPPLER STUDY Patient Name:  Peter Mann  Date of Exam:   05/09/2023 Medical Rec #: [id number]      Accession #:    [id number] Date of Birth: December 23, 1940      Patient Gender: M Patient Age:   16 years Exam Location:  [facility] Procedure:      VAS [place] ABI WITH/WO TBI Referring Phys: Midge Minium --------------------------------------------------------------------------------  Indications: Rest pain. High Risk Factors: Hypertension, Diabetes, past history of smoking, prior CVA. Other Factors: Recent injury/fall.  Comparison Study: No prior exam. Performing Technologist: Fernande Bras  Examination Guidelines: A complete evaluation includes at minimum, Doppler waveform signals and systolic blood pressure reading at the level of bilateral brachial, anterior tibial, and posterior tibial arteries, when vessel segments are accessible. Bilateral testing is considered an integral part of a complete examination. Photoelectric Plethysmograph (PPG) waveforms and toe systolic pressure readings are included as required and additional duplex testing as needed. Limited examinations for reoccurring indications may be performed as noted.  ABI Findings:  +---------+------------------+-----+---------+--------+ Right    Rt Pressure (mmHg)IndexWaveform Comment  +---------+------------------+-----+---------+--------+ Brachial 138                    triphasic         +---------+------------------+-----+---------+--------+ PTA      157               1.14 triphasic         +---------+------------------+-----+---------+--------+ DP       138               1.00 biphasic          +---------+------------------+-----+---------+--------+ Great Toe118               0.86 Normal            +---------+------------------+-----+---------+--------+ +---------+------------------+-----+---------+-------+ Left     Lt Pressure (mmHg)IndexWaveform Comment +---------+------------------+-----+---------+-------+ Brachial 135                    triphasic        +---------+------------------+-----+---------+-------+ PTA      151               1.09 triphasic        +---------+------------------+-----+---------+-------+ DP       159               1.15 triphasic        +---------+------------------+-----+---------+-------+ Great Toe127               0.92 Normal           +---------+------------------+-----+---------+-------+ +-------+-----------+-----------+------------+------------+ ABI/TBIToday's ABIToday's TBIPrevious ABIPrevious TBI +-------+-----------+-----------+------------+------------+ Right  1.14       0.86                                +-------+-----------+-----------+------------+------------+ Left   1.15       0.92                                +-------+-----------+-----------+------------+------------+  Summary: Right: Resting right ankle-brachial index is within normal range. The right toe-brachial index is normal. Left: Resting left ankle-brachial index is within normal  range. The left toe-brachial index is normal. *See table(s) above for measurements and observations.  Electronically signed by Coral Else MD on 05/10/2023 at 10:44:47 AM.    Final      Signature  -   Susa Raring M.D on 05/11/2023 at 10:16 AM   -  To page go to [email or web address]

## 2023-05-11 NOTE — TOC Progression Note (Addendum)
 Transition of Care [facility]) - Progression Note    Patient Details  Name: Peter Mann MRN: [id number] Date of Birth: 1940/12/26  Transition of Care [facility]) CM/SW Contact  Mearl Latin, LCSW Phone Number: 05/11/2023, 12:53 PM  Clinical Narrative:    12:52pm-CSW left voicemail for patient's daughter, Peter Mann, to discuss discharge plan for patient as he is requesting to return home rather than SNF.   2:42 PM-CSW received return call from patient's daughter who confirmed that she spoke with the patient and is aware that he want to return home. She reported that he is in agreement with home health service. CSW requested RNCM see who is in network for patient. Patient has a rollator, shower seat, and potty chair but daughter stated he mostly uses them as storage. She will be able to transport patient home at discharge potentially tomorrow. Daughter asked CSW about a [facility] referral. CSW will follow up. Voicemail left for [facility] enrollment team.    Expected Discharge Plan: Skilled Nursing Facility Barriers to Discharge: Continued Medical Work up  Expected Discharge Plan and Services In-house Referral: Clinical Social Work   Post Acute Care Choice: Skilled Nursing Facility Living arrangements for the past 2 months: Single Family Home                                       Social Determinants of Health (SDOH) Interventions SDOH Screenings   Food Insecurity: No Food Insecurity (05/08/2023)  Housing: Low Risk  (05/08/2023)  Transportation Needs: No Transportation Needs (05/08/2023)  Utilities: Not At Risk (05/08/2023)  Social Connections: Patient Declined (05/08/2023)  Tobacco Use: Medium Risk (05/08/2023)    Readmission Risk Interventions     No data to display

## 2023-05-12 ENCOUNTER — Other Ambulatory Visit (HOSPITAL_COMMUNITY): Payer: Self-pay

## 2023-05-12 DIAGNOSIS — R531 Weakness: Secondary | ICD-10-CM | POA: Diagnosis not present

## 2023-05-12 LAB — GLUCOSE, CAPILLARY: Glucose-Capillary: 120 mg/dL — ABNORMAL HIGH (ref 70–99)

## 2023-05-12 MED ORDER — ISOSORBIDE MONONITRATE ER 30 MG PO TB24
30.0000 mg | ORAL_TABLET | Freq: Every day | ORAL | 0 refills | Status: AC
  Filled 2023-05-12: qty 30, 30d supply, fill #0

## 2023-05-12 NOTE — Care Management Important Message (Signed)
 Important Message  Patient Details  Name: Peter Mann MRN: [id number] Date of Birth: [birth year]   Important Message Given:  Yes - Medicare IM Patient left prior to IM delivery will mail a copy to the patients home address.     [name] 05/12/2023, 3:33 PM

## 2023-05-12 NOTE — TOC Transition Note (Addendum)
 Transition of Care [facility]) - Discharge Note   Patient Details  Name: Lexander Tremblay MRN: [id number] Date of Birth: 1940/07/10  Transition of Care [facility]) CM/SW Contact:  Leone Haven, RN Phone Number: 05/12/2023, 10:01 AM   Clinical Narrative:     For dc today, has transport.  NCM notified Clifton Custard with [facility].  Final next level of care: Home w Home Health Services Barriers to Discharge: Continued Medical Work up   Patient Goals and CMS Choice Patient states their goals for this hospitalization and ongoing recovery are:: To get better   Choice offered to / list presented to : Patient      Discharge Placement                       Discharge Plan and Services Additional resources added to the After Visit Summary for   In-house Referral: Clinical Social Work   Post Acute Care Choice: Skilled Nursing Facility                               Social Drivers of Health (SDOH) Interventions SDOH Screenings   Food Insecurity: No Food Insecurity (05/08/2023)  Housing: Low Risk  (05/08/2023)  Transportation Needs: No Transportation Needs (05/08/2023)  Utilities: Not At Risk (05/08/2023)  Social Connections: Patient Declined (05/08/2023)  Tobacco Use: Medium Risk (05/08/2023)     Readmission Risk Interventions     No data to display

## 2023-05-12 NOTE — TOC Transition Note (Signed)
 Transition of Care [facility]) - Discharge Note   Patient Details  Name: Peter Mann MRN: [id number] Date of Birth: Dec 22, 1940  [facility] of [facility]) CM/SW Contact:  Mearl Latin, LCSW Phone Number: 05/12/2023, 12:28 PM   Clinical Narrative:    CSW received return call from [facility] with [facility] and sent them a referral for patient as requested. They will contact daughter to follow up.   Final next level of care: Home w Home Health Services Barriers to Discharge: Continued Medical Work up   Patient Goals and CMS Choice Patient states their goals for this hospitalization and ongoing recovery are:: To get better   Choice offered to / list presented to : Patient      Discharge Placement                       Discharge Plan and Services Additional resources added to the After Visit Summary for   In-house Referral: Clinical Social Work   Post Acute Care Choice: Skilled Nursing Facility                               Social Drivers of Health (SDOH) Interventions SDOH Screenings   Food Insecurity: No Food Insecurity (05/08/2023)  Housing: Low Risk  (05/08/2023)  Transportation Needs: No Transportation Needs (05/08/2023)  Utilities: Not At Risk (05/08/2023)  Social Connections: Patient Declined (05/08/2023)  Tobacco Use: Medium Risk (05/08/2023)     Readmission Risk Interventions     No data to display

## 2023-05-12 NOTE — Discharge Instructions (Signed)
 Follow with Primary MD Fleet Contras, MD in 7 days   Get CBC, CMP, 2 view Chest X ray -  checked next visit with your primary MD    Activity: As tolerated with Full fall precautions use walker/cane & assistance as needed  Disposition Home    Diet: Heart Healthy Low Carb  Special Instructions: If you have smoked or chewed Tobacco  in the last 2 yrs please stop smoking, stop any regular Alcohol  and or any Recreational drug use.  On your next visit with your primary care physician please Get Medicines reviewed and adjusted.  Please request your Prim.MD to go over all Hospital Tests and Procedure/Radiological results at the follow up, please get all Hospital records sent to your Prim MD by signing hospital release before you go home.  If you experience worsening of your admission symptoms, develop shortness of breath, life threatening emergency, suicidal or homicidal thoughts you must seek medical attention immediately by calling 911 or calling your MD immediately  if symptoms less severe.  You Must read complete instructions/literature along with all the possible adverse reactions/side effects for all the Medicines you take and that have been prescribed to you. Take any new Medicines after you have completely understood and accpet all the possible adverse reactions/side effects.   Do not drive when taking Pain medications.  Do not take more than prescribed Pain, Sleep and Anxiety Medications

## 2023-05-12 NOTE — Discharge Summary (Signed)
 Peter Mann ZOX:[id number] DOB: December 09, 1940 DOA: 05/07/2023  PCP: Fleet Contras, MD  Admit date: 05/07/2023  Discharge date: 05/12/2023  Admitted From: Home   Disposition:  Home   Recommendations for Outpatient Follow-up:   Follow up with PCP in 1-2 weeks  PCP Please obtain BMP/CBC, 2 view CXR in 1week,  (see Discharge instructions)   PCP Please follow up on the following pending results:    Home Health: PT, RN, SW, Aide if qualifies   Equipment/Devices: Environmental consultant  Consultations: None  Discharge Condition: Stable    CODE STATUS: Full    Diet Recommendation: Heart Healthy Low Carb    Chief Complaint  Patient presents with   Cough     Brief history of present illness from the day of admission and additional interim summary    83 y.o. male with history of diabetes mellitus type 2, hypertension, chronic kidney disease stage III, chronic anemia, history of stroke and prior history of prostate cancer was brought to the ER after patient had a fall at home.   Patient states he has been having constant cough for the last 3 weeks and had gone to the urgent care day prior to admission.  Was treated symptomatically and after reaching home he had two more falls.  After the second fall he alerted the emergency care and was brought to the ER.  Patient denies any chest pain shortness of breath but has been having cough.  He has been experiencing some lower extremity pain.                                                                   Hospital Course   Recent influenza A infection causing decreased oral intake, dehydration, generalized weakness and falls. -Has had influenza infection diagnosed about 2 weeks ago, currently not a Tamiflu candidate, due to poor oral intake has become dehydrated and weak with multiple falls at  home, hydrated with IV fluids, PT OT, patient refused SNF, home PT OT and RN ordered along with home walker, of note his head CT was nonacute, he has no headache and no focal deficits.  Wants to go home and does not want to go to SNF.  Family updated as well.      Constipation - Patient endorses he usually has bowel movements once every 1 to 2 weeks, when bowel regimen with good results.  PCP to monitor.   BPH (benign prostatic hyperplasia) - Continue Flomax   Thrombocytopenia (HCC) - suspect from acute viral illness - trend as needed   Anemia - Considered in setting of anemia of chronic disease   History of stroke Continue aspirin and statin   Diabetes mellitus type 2, noninsulin dependent (HCC) - Continue home Rx   Hyperlipidemia - Continue statin   Essential hypertension -  Continue amlodipine, metoprolol, add low-dose Imdur for better blood pressure control.  PCP to monitor.   Principal Problem:   Weakness Active Problems:   Influenza A   Constipation   Essential hypertension   Hyperlipidemia   Diabetes mellitus type 2, noninsulin dependent (HCC)   History of stroke   Anemia   Thrombocytopenia (HCC)   Fall at home, initial encounter   BPH (benign prostatic hyperplasia)    Discharge instructions    Discharge Instructions     Discharge instructions   Complete by: As directed    Follow with Primary MD Fleet Contras, MD in 7 days   Get CBC, CMP, 2 view Chest X ray -  checked next visit with your primary MD    Activity: As tolerated with Full fall precautions use walker/cane & assistance as needed  Disposition Home    Diet: Heart Healthy Low Carb  Special Instructions: If you have smoked or chewed Tobacco  in the last 2 yrs please stop smoking, stop any regular Alcohol  and or any Recreational drug use.  On your next visit with your primary care physician please Get Medicines reviewed and adjusted.  Please request your Prim.MD to go over all Hospital  Tests and Procedure/Radiological results at the follow up, please get all Hospital records sent to your Prim MD by signing hospital release before you go home.  If you experience worsening of your admission symptoms, develop shortness of breath, life threatening emergency, suicidal or homicidal thoughts you must seek medical attention immediately by calling 911 or calling your MD immediately  if symptoms less severe.  You Must read complete instructions/literature along with all the possible adverse reactions/side effects for all the Medicines you take and that have been prescribed to you. Take any new Medicines after you have completely understood and accpet all the possible adverse reactions/side effects.   Do not drive when taking Pain medications.  Do not take more than prescribed Pain, Sleep and Anxiety Medications   Increase activity slowly   Complete by: As directed        Discharge Medications   Allergies as of 05/12/2023   No Known Allergies      Medication List     STOP taking these medications    cephALEXin 500 MG capsule Commonly known as: KEFLEX   tiZANidine 4 MG tablet Commonly known as: ZANAFLEX       TAKE these medications    acetaminophen 500 MG tablet Commonly known as: TYLENOL Take 1,000 mg by mouth every 6 (six) hours as needed for mild pain or headache.   Alphagan P 0.1 % Soln Generic drug: brimonidine Place 1 drop into both eyes 2 (two) times daily.   amLODipine 10 MG tablet Commonly known as: NORVASC Take 10 mg by mouth daily.   aspirin 325 MG tablet Take 1 tablet (325 mg total) by mouth daily.   Cequa 0.09 % Soln Generic drug: cycloSPORINE (PF) Place 1 drop into both eyes 2 (two) times daily.   dexlansoprazole 60 MG capsule Commonly known as: DEXILANT Take 60 mg by mouth daily.   diclofenac sodium 1 % Gel Commonly known as: VOLTAREN Apply 2 g topically as needed (pain).   isosorbide mononitrate 30 MG 24 hr tablet Commonly known as:  IMDUR Take 1 tablet (30 mg total) by mouth daily.   Jardiance 25 MG Tabs tablet Generic drug: empagliflozin Take 25 mg by mouth daily.   Linzess 290 MCG Caps capsule Generic drug: linaclotide Take 290 mcg  by mouth daily.   lubiprostone 24 MCG capsule Commonly known as: AMITIZA Take 24 mcg by mouth 2 (two) times daily with a meal.   meclizine 25 MG tablet Commonly known as: ANTIVERT Take 25 mg by mouth 2 (two) times daily.   metFORMIN 500 MG tablet Commonly known as: GLUCOPHAGE Take 500 mg by mouth 2 (two) times daily.   metoprolol tartrate 25 MG tablet Commonly known as: LOPRESSOR TAKE 1 TABLET BY MOUTH TWICE A DAY   polyethylene glycol 17 g packet Commonly known as: MiraLax Take 17 g by mouth daily as needed for moderate constipation or mild constipation (if no bowel movement in 2 days).   rosuvastatin 20 MG tablet Commonly known as: CRESTOR Take 20 mg by mouth daily.   saxagliptin HCl 5 MG Tabs tablet Commonly known as: ONGLYZA Take 5 mg by mouth daily.   senna-docusate 8.6-50 MG tablet Commonly known as: Senokot-S Take 1 tablet by mouth at bedtime.   tamsulosin 0.4 MG Caps capsule Commonly known as: FLOMAX Take 0.4 mg by mouth daily.               Durable Medical Equipment  (From admission, onward)           Start     Ordered   05/12/23 0904  For home use only DME Walker rolling  Once       Comments: 5 wheel  Question Answer Comment  Walker: With 5 Inch Wheels   Patient needs a walker to treat with the following condition Weakness      05/12/23 0903             Follow-up Information     [facility] Follow up.   Specialty: Home Health Services Why: Agency will contact you to set up apt times. Contact information: [address] [place] [address] [phone]         Fleet Contras, MD. Schedule an appointment as soon as possible for a visit in 1 week(s).   Specialty: Internal Medicine Contact  information: [address] [place] [address] [phone]                 Major procedures and Radiology Reports - PLEASE review detailed and final reports thoroughly  -      VAS [place] ABI WITH/WO TBI Result Date: 05/10/2023  LOWER EXTREMITY DOPPLER STUDY Patient Name:  Peter Mann  Date of Exam:   05/09/2023 Medical Rec #: [id number]      Accession #:    [id number] Date of Birth: 1940-03-12      Patient Gender: M Patient Age:   56 years Exam Location:  [facility] Procedure:      VAS [place] ABI WITH/WO TBI Referring Phys: Midge Minium --------------------------------------------------------------------------------  Indications: Rest pain. High Risk Factors: Hypertension, Diabetes, past history of smoking, prior CVA. Other Factors: Recent injury/fall.  Comparison Study: No prior exam. Performing Technologist: Fernande Bras  Examination Guidelines: A complete evaluation includes at minimum, Doppler waveform signals and systolic blood pressure reading at the level of bilateral brachial, anterior tibial, and posterior tibial arteries, when vessel segments are accessible. Bilateral testing is considered an integral part of a complete examination. Photoelectric Plethysmograph (PPG) waveforms and toe systolic pressure readings are included as required and additional duplex testing as needed. Limited examinations for reoccurring indications may be performed as noted.  ABI Findings: +---------+------------------+-----+---------+--------+ Right    Rt Pressure (mmHg)IndexWaveform Comment  +---------+------------------+-----+---------+--------+ Brachial 138  triphasic         +---------+------------------+-----+---------+--------+ PTA      157               1.14 triphasic         +---------+------------------+-----+---------+--------+ DP       138               1.00 biphasic          +---------+------------------+-----+---------+--------+ Great  Toe118               0.86 Normal            +---------+------------------+-----+---------+--------+ +---------+------------------+-----+---------+-------+ Left     Lt Pressure (mmHg)IndexWaveform Comment +---------+------------------+-----+---------+-------+ Brachial 135                    triphasic        +---------+------------------+-----+---------+-------+ PTA      151               1.09 triphasic        +---------+------------------+-----+---------+-------+ DP       159               1.15 triphasic        +---------+------------------+-----+---------+-------+ Great Toe127               0.92 Normal           +---------+------------------+-----+---------+-------+ +-------+-----------+-----------+------------+------------+ ABI/TBIToday's ABIToday's TBIPrevious ABIPrevious TBI +-------+-----------+-----------+------------+------------+ Right  1.14       0.86                                +-------+-----------+-----------+------------+------------+ Left   1.15       0.92                                +-------+-----------+-----------+------------+------------+  Summary: Right: Resting right ankle-brachial index is within normal range. The right toe-brachial index is normal. Left: Resting left ankle-brachial index is within normal range. The left toe-brachial index is normal. *See table(s) above for measurements and observations.  Electronically signed by Coral Else MD on 05/10/2023 at 10:44:47 AM.    Final    DG Pelvis 1-2 Views Result Date: 05/08/2023 CLINICAL DATA:  History of recent falls. EXAM: PELVIS - 1-2 VIEW COMPARISON:  PET-CT dated 04/07/2023. FINDINGS: There is no evidence of acute pelvic fracture or diastasis. No pelvic bone lesions are seen. Sacroiliac joints and pubic symphysis are anatomically aligned. Fiducial markers within the prostate gland. IMPRESSION: No acute osseous abnormality identified. Electronically Signed   By: Hart Robinsons  M.D.   On: 05/08/2023 09:00   CT Head Wo Contrast Result Date: 05/08/2023 CLINICAL DATA:  Recent head trauma with headaches, initial encounter EXAM: CT HEAD WITHOUT CONTRAST TECHNIQUE: Contiguous axial images were obtained from the base of the skull through the vertex without intravenous contrast. RADIATION DOSE REDUCTION: This exam was performed according to the departmental dose-optimization program which includes automated exposure control, adjustment of the mA and/or kV according to patient size and/or use of iterative reconstruction technique. COMPARISON:  08/23/2015 FINDINGS: Brain: No evidence of acute infarction, hemorrhage, hydrocephalus, extra-axial collection or mass lesion/mass effect. Chronic atrophic and white matter ischemic changes are again seen. 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 Vascular: No hyperdense vessel or unexpected calcification. Skull: Normal. Negative for fracture or focal lesion. Sinuses/Orbits: No acute finding. Other: None.  IMPRESSION: Chronic atrophic and ischemic changes as described. Electronically Signed   By: Alcide Clever M.D.   On: 05/08/2023 03:39   DG Chest Portable 1 View Result Date: 05/08/2023 CLINICAL DATA:  Cough. EXAM: PORTABLE CHEST 1 VIEW COMPARISON:  Chest x-ray 04/16/2018 FINDINGS: Cardiac silhouette is mildly enlarged. The lungs are clear. There is no pleural effusion or pneumothorax. No acute fractures are seen. IMPRESSION: Mildly enlarged cardiac silhouette. No acute cardiopulmonary process. Electronically Signed   By: Darliss Cheney M.D.   On: 05/08/2023 02:36    Micro Results    Recent Results (from the past 240 hours)  Resp panel by RT-PCR (RSV, Flu A&B, Covid) Anterior Nasal Swab     Status: Abnormal   Collection Time: 05/08/23 12:36 AM   Specimen: Anterior Nasal Swab  Result Value Ref Range Status   SARS Coronavirus 2 by RT PCR NEGATIVE NEGATIVE Final   Influenza A by PCR POSITIVE (A) NEGATIVE Final   Influenza B by PCR  NEGATIVE NEGATIVE Final    Comment: (NOTE) The Xpert Xpress SARS-CoV-2/FLU/RSV plus assay is intended as an aid in the diagnosis of influenza from Nasopharyngeal swab specimens and should not be used as a sole basis for treatment. Nasal washings and aspirates are unacceptable for Xpert Xpress SARS-CoV-2/FLU/RSV testing.  Fact Sheet for Patients: BloggerCourse.com  Fact Sheet for Healthcare Providers: SeriousBroker.it  This test is not yet approved or cleared by the [place] FDA and has been authorized for detection and/or diagnosis of SARS-CoV-2 by FDA under an Emergency Use Authorization (EUA). This EUA will remain in effect (meaning this test can be used) for the duration of the COVID-19 declaration under Section 564(b)(1) of the Act, 21 U.S.C. section 360bbb-3(b)(1), unless the authorization is terminated or revoked.     Resp Syncytial Virus by PCR NEGATIVE NEGATIVE Final    Comment: (NOTE) Fact Sheet for Patients: BloggerCourse.com  Fact Sheet for Healthcare Providers: SeriousBroker.it  This test is not yet approved or cleared by the [place] FDA and has been authorized for detection and/or diagnosis of SARS-CoV-2 by FDA under an Emergency Use Authorization (EUA). This EUA will remain in effect (meaning this test can be used) for the duration of the COVID-19 declaration under Section 564(b)(1) of the Act, 21 U.S.C. section 360bbb-3(b)(1), unless the authorization is terminated or revoked.  Performed at [facility], [address] [place], [place] [address]     Today   Subjective    Peter Mann today has no headache,no chest abdominal pain,no new weakness tingling or numbness, feels much better wants to go home today.    Objective   Blood pressure 150/85, pulse 80, temperature 99 F (37.2 C), temperature source Oral, resp. rate 15, height 5\' 7"   (1.702 m), weight 78.9 kg, SpO2 99%.   Intake/Output Summary (Last 24 hours) at 05/12/2023 0905 Last data filed at 05/12/2023 0600 Gross per 24 hour  Intake 720 ml  Output 900 ml  Net -180 ml    Exam  Awake Alert, No new F.N deficits,    [place].AT,PERRAL Supple Neck,   Symmetrical Chest wall movement, Good air movement bilaterally, CTAB RRR,No Gallops,   +ve B.Sounds, Abd Soft, Non tender,  No Cyanosis, Clubbing or edema    Data Review   Recent Labs  Lab 05/08/23 0024 05/08/23 1436 05/09/23 0550  WBC 6.3 6.8 6.8  HGB 10.2* 10.7* 10.8*  HCT 32.8* 34.0* 34.4*  PLT 134* 213 168  MCV 85.2 83.7 84.9  MCH 26.5  26.4 26.7  MCHC 31.1 31.5 31.4  RDW 15.7* 15.8* 15.5  LYMPHSABS 0.4* 0.6* 0.6*  MONOABS 0.5 0.6 0.7  EOSABS 0.0 0.0 0.0  BASOSABS 0.0 0.0 0.0    Recent Labs  Lab 05/08/23 0024 05/08/23 1436 05/09/23 0550  NA 141 141 139  K 3.5 3.2* 3.5  CL 105 108 104  CO2 23 23 23   ANIONGAP 13 10 12   GLUCOSE 120* 95 104*  BUN 18 18 17   CREATININE 1.30* 1.05 1.11  AST 59* 81*  --   ALT 15 18  --   ALKPHOS 55 47  --   BILITOT 0.6 0.7  --   ALBUMIN 3.4* 3.2*  --   TSH  --  1.213  --   HGBA1C  --  8.2*  --   BNP  --   --  80.0  MG  --  1.8 1.9  CALCIUM 9.0 8.8* 8.8*    Total Time in preparing paper work, data evaluation and todays exam - 35 minutes  Signature  -    Susa Raring M.D on 05/12/2023 at 9:05 AM   -  To page go to [email or web address]

## 2023-05-12 NOTE — Plan of Care (Signed)

## 2023-05-12 NOTE — Plan of Care (Signed)
 Pt has rested quietly throughout the night with no distress noted. Alert and oriented. Forgetful at times. On room air. SB/SR on the monitor. Up to [facility] to have a large BM. [place] intact to suction. No complaints voiced. EKG done per MD order. HR dropped during night in the 40's while pt was snoring and would come back up to 60's when woke up.     Problem: Education: Goal: Knowledge of General Education information will improve Description: Including pain rating scale, medication(s)/side effects and non-pharmacologic comfort measures Outcome: Progressing   Problem: Clinical Measurements: Goal: Respiratory complications will improve Outcome: Progressing   Problem: Activity: Goal: Risk for activity intolerance will decrease Outcome: Progressing   Problem: Elimination: Goal: Will not experience complications related to bowel motility Outcome: Progressing Goal: Will not experience complications related to urinary retention Outcome: Progressing   Problem: Pain Managment: Goal: General experience of comfort will improve and/or be controlled Outcome: Progressing

## 2023-05-13 ENCOUNTER — Other Ambulatory Visit (HOSPITAL_COMMUNITY): Payer: Self-pay

## 2023-10-05 ENCOUNTER — Ambulatory Visit (INDEPENDENT_AMBULATORY_CARE_PROVIDER_SITE_OTHER): Admitting: Neurology

## 2023-10-05 ENCOUNTER — Encounter: Payer: Self-pay | Admitting: Neurology

## 2023-10-05 VITALS — BP 143/73 | HR 59 | Ht 67.0 in | Wt 162.2 lb

## 2023-10-05 DIAGNOSIS — R296 Repeated falls: Secondary | ICD-10-CM

## 2023-10-05 DIAGNOSIS — E538 Deficiency of other specified B group vitamins: Secondary | ICD-10-CM

## 2023-10-05 DIAGNOSIS — R269 Unspecified abnormalities of gait and mobility: Secondary | ICD-10-CM | POA: Diagnosis not present

## 2023-10-05 DIAGNOSIS — Z8673 Personal history of transient ischemic attack (TIA), and cerebral infarction without residual deficits: Secondary | ICD-10-CM | POA: Diagnosis not present

## 2023-10-05 NOTE — Patient Instructions (Addendum)
 Please follow up with PCP regarding the complaints of hemorrhoids  Start B12 supplement daily Continue your current medications  Follow up with PCP regarding the hemorrhoids  Referral to physical therapy for gait abnormality and multiple falls  Return as needed

## 2023-10-05 NOTE — Progress Notes (Unsigned)
 [facility]  PATIENT: Peter Mann DOB: 1941-02-07  REQUESTING CLINICIAN: [name], MD HISTORY FROM: Patient  REASON FOR VISIT: As noted in the HPI    HISTORICAL  CHIEF COMPLAINT:  Chief Complaint  Patient presents with   New Patient (Initial Visit)    Room 13 pt is with his wife ,New patient -2018/Paper referral for hx of CVA/ Atlas Shelda MD [facility], feeling all tried ,pt had fallen a couple weeks ago , he stated that he didn't injury his safe when fall.    HISTORY OF PRESENT ILLNESS:  This 83 year old gentleman past medical history including hypertension, hyperlipidemia, diabetes, history of stroke who was referred for history of stroke.  Patient had a stroke back in 2017.  From the stroke, he did report some left-sided deficit but his deficit improved.   Today he thought the visit was regarding his hemorrhoid.  He tells me that he has been suffering with hemorrhoid for a long time.  He has difficulty sitting down for long period of time causing his discomfort.  He is on aspirin  81 mg daily, he is also on Crestor  20 mg daily. He is accompanied by wife who reports the patient has been falling.  He was recommended a walker but he does not use it.  The last fall was 2 weeks ago. He was recently admitted in February for the flu, upon discharge, he completed PT, but did subsequent falls.   OTHER MEDICAL CONDITIONS: Hypertension, Hyperlipidemia, History of stroke, Diabetes    REVIEW OF SYSTEMS: Full 14 system review of systems performed and negative with exception of: As noted in the HPI   ALLERGIES: No Known Allergies  HOME MEDICATIONS: Outpatient Medications Prior to Visit  Medication Sig Dispense Refill   acetaminophen  (TYLENOL ) 500 MG tablet Take 1,000 mg by mouth every 6 (six) hours as needed for mild pain or headache.     ALPHAGAN P 0.1 % SOLN Place 1 drop into both eyes 2 (two) times daily.     amLODipine  (NORVASC ) 10 MG tablet  Take 10 mg by mouth daily.     aspirin  325 MG tablet Take 1 tablet (325 mg total) by mouth daily. 30 tablet 0   CEQUA  0.09 % SOLN Place 1 drop into both eyes 2 (two) times daily.     dexlansoprazole (DEXILANT) 60 MG capsule Take 60 mg by mouth daily.     diclofenac sodium (VOLTAREN) 1 % GEL Apply 2 g topically as needed (pain).      isosorbide  mononitrate (IMDUR ) 30 MG 24 hr tablet Take 1 tablet (30 mg total) by mouth daily. 30 tablet 0   JARDIANCE 25 MG TABS tablet Take 25 mg by mouth daily.     LINZESS  290 MCG CAPS capsule Take 290 mcg by mouth daily.     lubiprostone (AMITIZA) 24 MCG capsule Take 24 mcg by mouth 2 (two) times daily with a meal.     meclizine  (ANTIVERT ) 25 MG tablet Take 25 mg by mouth 2 (two) times daily.     metFORMIN  (GLUCOPHAGE ) 500 MG tablet Take 500 mg by mouth 2 (two) times daily.     metoprolol  tartrate (LOPRESSOR ) 25 MG tablet TAKE 1 TABLET BY MOUTH TWICE A DAY 120 tablet 0   polyethylene glycol (MIRALAX ) 17 g packet Take 17 g by mouth daily as needed for moderate constipation or mild constipation (if no bowel movement in 2 days). 14 each 0   rosuvastatin  (CRESTOR ) 20 MG tablet Take 20  mg by mouth daily.     saxagliptin HCl (ONGLYZA) 5 MG TABS tablet Take 5 mg by mouth daily.     senna-docusate (SENOKOT-S) 8.6-50 MG tablet Take 1 tablet by mouth at bedtime. 30 tablet 1   tamsulosin  (FLOMAX ) 0.4 MG CAPS capsule Take 0.4 mg by mouth daily.      No facility-administered medications prior to visit.    PAST MEDICAL HISTORY: Past Medical History:  Diagnosis Date   At low risk for fall    Cancer (HCC)    prostrate ca, radiation treatments   Chest pain    COPD (chronic obstructive pulmonary disease) (HCC)    Dementia (HCC)    Diabetes mellitus without complication (HCC)    Dizziness    History of tobacco abuse    Hyperlipidemia    Hypertension    Stroke [facility])    Subconjunctival hemorrhage of left eye 07/18/2019   The condition of the involved eye is that of a  subconjunctival hemorrhage.  These are often spontaneous but are also often at or near the site of low location of an injection should to be placed into the eye.  In the absence of direct blunt or severe trauma these will typically resolve on their own spontaneously and have no impact on the vision.    These are very similar to having a bruise in your    PAST SURGICAL HISTORY: Past Surgical History:  Procedure Laterality Date   CHOLECYSTECTOMY N/A 01/30/2014   Procedure: LAPAROSCOPIC CHOLECYSTECTOMY;  Surgeon: [name], MD;  Location: [facility] OR;  Service: General;  Laterality: N/A;    FAMILY HISTORY: Family History  Problem Relation Age of Onset   Cancer Brother    Cancer Sister     SOCIAL HISTORY: Social History   Socioeconomic History   Marital status: Legally Separated    Spouse name: Not on file   Number of children: 3   Years of education: 7   Highest education level: Not on file  Occupational History   Not on file  Tobacco Use   Smoking status: Former    Current packs/day: 0.00    Types: Cigarettes    Quit date: 11/19/2018    Years since quitting: 4.8   Smokeless tobacco: Never   Tobacco comments:    smoke 3 to 4 cigarettes a day, 09/08/16 quit 3 wks ago  Vaping Use   Vaping status: Never Used  Substance and Sexual Activity   Alcohol  use: No    Alcohol /week: 0.0 standard drinks of alcohol     Comment: quit 25 years ago though before drank so much that you couldn't tell b/t alcohol  and wine   Drug use: No   Sexual activity: Never  Other Topics Concern   Not on file  Social History Narrative   Lives alone   Social Drivers of Health   Financial Resource Strain: Not on file  Food Insecurity: No Food Insecurity (05/08/2023)   Hunger Vital Sign    Worried About Running Out of Food in the Last Year: Never true    Ran Out of Food in the Last Year: Never true  Transportation Needs: No Transportation Needs (05/08/2023)   PRAPARE - Scientist, research (physical sciences) (Medical): No    Lack of Transportation (Non-Medical): No  Physical Activity: Not on file  Stress: Not on file  Social Connections: Patient Declined (05/08/2023)   Social Connection and Isolation Panel    Frequency of Communication with Friends and Family: Patient declined  Frequency of Social Gatherings with Friends and Family: Patient declined    Attends Religious Services: Patient declined    Active Member of Clubs or Organizations: Patient declined    Attends Banker Meetings: Patient declined    Marital Status: Patient declined  Intimate Partner Violence: Not At Risk (05/08/2023)   Humiliation, Afraid, Rape, and Kick questionnaire    Fear of Current or Ex-Partner: No    Emotionally Abused: No    Physically Abused: No    Sexually Abused: No    PHYSICAL EXAM  GENERAL EXAM/CONSTITUTIONAL: Vitals:  Vitals:   10/05/23 0820  BP: (!) 143/73  Pulse: (!) 59  Weight: 162 lb 3.2 oz (73.6 kg)  Height: 5' 7 (1.702 m)   Body mass index is 25.4 kg/m. Wt Readings from Last 3 Encounters:  10/05/23 162 lb 3.2 oz (73.6 kg)  05/08/23 174 lb (78.9 kg)  02/25/22 186 lb 9.6 oz (84.6 kg)   Patient is in no distress; well developed, nourished and groomed; neck is supple  MUSCULOSKELETAL: Gait, strength, tone, movements noted in Neurologic exam below  NEUROLOGIC: MENTAL STATUS:      No data to display         awake, alert, oriented to person, place and time recent and remote memory intact normal attention and concentration language fluent, comprehension intact, naming intact fund of knowledge appropriate  CRANIAL NERVE:  2nd, 3rd, 4th, 6th - Visual fields full to confrontation, extraocular muscles intact, no nystagmus 5th - facial sensation symmetric 7th - facial strength symmetric 8th - hearing intact 9th - palate elevates symmetrically, uvula midline 11th - shoulder shrug symmetric 12th - tongue protrusion midline  MOTOR:  normal bulk and  tone, full strength in the BUE, BLE. Mild rigidity on the RUE  SENSORY:  normal and symmetric to light touch  COORDINATION:  Difficulty with fine movement, worse on the left   GAIT/STATION:  Shuffling gait      DIAGNOSTIC DATA (LABS, IMAGING, TESTING) - I reviewed patient records, labs, notes, testing and imaging myself where available.  Lab Results  Component Value Date   WBC 6.8 05/09/2023   HGB 10.8 (L) 05/09/2023   HCT 34.4 (L) 05/09/2023   MCV 84.9 05/09/2023   PLT 168 05/09/2023      Component Value Date/Time   NA 139 05/09/2023 0550   K 3.5 05/09/2023 0550   CL 104 05/09/2023 0550   CO2 23 05/09/2023 0550   GLUCOSE 104 (H) 05/09/2023 0550   BUN 17 05/09/2023 0550   CREATININE 1.11 05/09/2023 0550   CREATININE 1.27 (H) 04/08/2022 0958   CALCIUM  8.8 (L) 05/09/2023 0550   PROT 6.5 05/08/2023 1436   ALBUMIN 3.2 (L) 05/08/2023 1436   AST 81 (H) 05/08/2023 1436   ALT 18 05/08/2023 1436   ALKPHOS 47 05/08/2023 1436   BILITOT 0.7 05/08/2023 1436   GFRNONAA >60 05/09/2023 0550   GFRAA 58 (L) 04/16/2018 0900   Lab Results  Component Value Date   CHOL 121 04/08/2022   HDL 27 (L) 04/08/2022   LDLCALC 63 04/08/2022   TRIG 267 (H) 04/08/2022   CHOLHDL 4.5 04/08/2022   Lab Results  Component Value Date   HGBA1C 8.2 (H) 05/08/2023   Lab Results  Component Value Date   VITAMINB12 109 (L) 05/08/2023   Lab Results  Component Value Date   TSH 1.213 05/08/2023    Head CT 05/08/2023 Chronic atrophic and ischemic changes as described     ASSESSMENT AND PLAN  83 y.o. year old male with vascular risk factors including hypertension, hyperlipidemia, diabetes, history of stroke in 2017 who was referred for his history of stroke.  From his previous stroke he did have some left-sided deficit but improved.  He is on the best medical therapy with aspirin  and Crestor .  He is also on metformin .  Advised patient to continue his current medications For his complaint of  hemorrhoid, I did advise him to follow-up with PCP.   Today on exam he was noted to have shuffling gait, difficulty with fine motor, some right-sided rigidity.  He also reports multiple falls.  I will refer him to home PT for gait training. Labs showed a low B12 level, I will start him on B12 supplementation.   Patient will continue to follow-up with PCP and return as needed.   1. History of stroke   2. Gait abnormality   3. Recurrent falls   4. B12 deficiency      Patient Instructions  Please follow up with PCP regarding the complaints of hemorrhoids  Start B12 supplement daily Continue your current medications  Follow up with PCP regarding the hemorrhoids  Referral to physical therapy for gait abnormality and multiple falls  Return as needed   Orders Placed This Encounter  Procedures   Ambulatory referral to Home Health    Meds ordered this encounter  Medications   cyanocobalamin (VITAMIN B12) 1000 MCG tablet    Sig: Take 1 tablet (1,000 mcg total) by mouth daily.    Dispense:  90 tablet    Refill:  3    Return if symptoms worsen or fail to improve.    Pastor Falling, MD 10/06/2023, 7:55 AM  [facility] [address], [address] [place], [place] [address] [phone]

## 2023-10-06 ENCOUNTER — Telehealth: Payer: Self-pay | Admitting: Neurology

## 2023-10-06 MED ORDER — VITAMIN B-12 1000 MCG PO TABS: 1000.0000 ug | ORAL_TABLET | Freq: Every day | ORAL | 3 refills | Status: AC

## 2023-10-06 NOTE — Telephone Encounter (Signed)
 [facility] is going to take this patient.

## 2023-10-21 ENCOUNTER — Telehealth: Payer: Self-pay

## 2023-10-21 NOTE — Telephone Encounter (Signed)
 Signed orders faxed to [facility]

## 2023-11-07 ENCOUNTER — Emergency Department (HOSPITAL_COMMUNITY)
Admission: EM | Admit: 2023-11-07 | Discharge: 2023-11-07 | Disposition: A | Discharge status: Home / Self Care | Attending: Emergency Medicine | Admitting: Emergency Medicine

## 2023-11-07 DIAGNOSIS — E119 Type 2 diabetes mellitus without complications: Secondary | ICD-10-CM | POA: Diagnosis not present

## 2023-11-07 DIAGNOSIS — Z79899 Other long term (current) drug therapy: Secondary | ICD-10-CM | POA: Insufficient documentation

## 2023-11-07 DIAGNOSIS — I10 Essential (primary) hypertension: Secondary | ICD-10-CM | POA: Diagnosis not present

## 2023-11-07 DIAGNOSIS — T59891A Toxic effect of other specified gases, fumes and vapors, accidental (unintentional), initial encounter: Secondary | ICD-10-CM | POA: Diagnosis present

## 2023-11-07 DIAGNOSIS — J449 Chronic obstructive pulmonary disease, unspecified: Secondary | ICD-10-CM | POA: Insufficient documentation

## 2023-11-07 DIAGNOSIS — Z7984 Long term (current) use of oral hypoglycemic drugs: Secondary | ICD-10-CM | POA: Diagnosis not present

## 2023-11-07 DIAGNOSIS — Z7982 Long term (current) use of aspirin: Secondary | ICD-10-CM | POA: Diagnosis not present

## 2023-11-07 DIAGNOSIS — F0390 Unspecified dementia without behavioral disturbance: Secondary | ICD-10-CM | POA: Diagnosis not present

## 2023-11-07 LAB — URINALYSIS, W/ REFLEX TO CULTURE (INFECTION SUSPECTED)
Bacteria, UA: NONE SEEN
Bilirubin Urine: NEGATIVE
Glucose, UA: >=500 mg/dL — AB
Hgb urine dipstick: NEGATIVE
Ketones, ur: NEGATIVE mg/dL
Leukocytes,Ua: NEGATIVE
Nitrite: NEGATIVE
Protein, ur: NEGATIVE mg/dL
Specific Gravity, Urine: 1.012 (ref 1.005–1.030)
pH: 5 (ref 5.0–8.0)

## 2023-11-07 LAB — COMPREHENSIVE METABOLIC PANEL WITH GFR
ALT: 7 U/L (ref 0–44)
AST: 18 U/L (ref 15–41)
Albumin: 4.4 g/dL (ref 3.5–5.0)
Alkaline Phosphatase: 74 U/L (ref 38–126)
Anion gap: 12 (ref 5–15)
BUN: 20 mg/dL (ref 8–23)
CO2: 23 mmol/L (ref 22–32)
Calcium: 9.8 mg/dL (ref 8.9–10.3)
Chloride: 106 mmol/L (ref 98–111)
Creatinine, Ser: 1.32 mg/dL — ABNORMAL HIGH (ref 0.61–1.24)
GFR, Estimated: 53 mL/min — ABNORMAL LOW (ref >60)
Glucose, Bld: 105 mg/dL — ABNORMAL HIGH (ref 70–99)
Potassium: 3.7 mmol/L (ref 3.5–5.1)
Sodium: 141 mmol/L (ref 135–145)
Total Bilirubin: 0.5 mg/dL (ref 0.0–1.2)
Total Protein: 7.5 g/dL (ref 6.5–8.1)

## 2023-11-07 LAB — CBC WITH DIFFERENTIAL/PLATELET
Abs Immature Granulocytes: 0.01 K/uL (ref 0.00–0.07)
Basophils Absolute: 0 K/uL (ref 0.0–0.1)
Basophils Relative: 1 %
Eosinophils Absolute: 0.1 K/uL (ref 0.0–0.5)
Eosinophils Relative: 2 %
HCT: 35.8 % — ABNORMAL LOW (ref 39.0–52.0)
Hemoglobin: 10.9 g/dL — ABNORMAL LOW (ref 13.0–17.0)
Immature Granulocytes: 0 %
Lymphocytes Relative: 20 %
Lymphs Abs: 0.6 K/uL — ABNORMAL LOW (ref 0.7–4.0)
MCH: 25.8 pg — ABNORMAL LOW (ref 26.0–34.0)
MCHC: 30.4 g/dL (ref 30.0–36.0)
MCV: 84.8 fL (ref 80.0–100.0)
Monocytes Absolute: 0.5 K/uL (ref 0.1–1.0)
Monocytes Relative: 16 %
Neutro Abs: 1.9 K/uL (ref 1.7–7.7)
Neutrophils Relative %: 61 %
Platelets: 240 K/uL (ref 150–400)
RBC: 4.22 MIL/uL (ref 4.22–5.81)
RDW: 16.3 % — ABNORMAL HIGH (ref 11.5–15.5)
WBC: 3 K/uL — ABNORMAL LOW (ref 4.0–10.5)
nRBC: 0 % (ref 0.0–0.2)

## 2023-11-07 NOTE — ED Triage Notes (Signed)
 Patient bib EMS ,  Patient cleaning with bleach last night and it made him feel funny Patient is feeling fine today but wanted to be evaluated at ED EMS reports patients cognition questionable, but we are unaware of baseline  Patient reported dysuria to EMS , no hematuria H/o uti  [place] w/ EMS [phone]

## 2023-11-07 NOTE — ED Notes (Signed)
 Patient tried to provide a urine sample but is unable at this time. Patient given a cup of water 

## 2023-11-07 NOTE — ED Provider Notes (Signed)
 [place] EMERGENCY DEPARTMENT AT [facility] Provider Note   CSN: [id number] Arrival date & time: 11/07/23  1123     Patient presents with: Toxic Inhalation   Peter Mann is a 83 y.o. male with PMHx COPD, dementia, DM, HLD, HTN, CVA who presents to ED concerned for bleach inhalation that occurred yesterday. Patient was cleaning the floor near his over with Clorox and forgot to mix water  in with it. Patient endorsing feeling terrible and SOB during the exposure yesterday which has since resolved. Patient currently denies headache, fever, chest pain, SOB, cough, nausea, vomiting, diarrhea.    HPI     Prior to Admission medications   Medication Sig Start Date End Date Taking? Authorizing Provider  acetaminophen  (TYLENOL ) 500 MG tablet Take 1,000 mg by mouth every 6 (six) hours as needed for mild pain or headache.    [provider]  ALPHAGAN P 0.1 % SOLN Place 1 drop into both eyes 2 (two) times daily. 04/01/23   [provider]  amLODipine  (NORVASC ) 10 MG tablet Take 10 mg by mouth daily.    [provider]  aspirin  325 MG tablet Take 1 tablet (325 mg total) by mouth daily. 08/27/15   Jerri Keys, MD  CEQUA  0.09 % SOLN Place 1 drop into both eyes 2 (two) times daily.    [provider]  cyanocobalamin (VITAMIN B12) 1000 MCG tablet Take 1 tablet (1,000 mcg total) by mouth daily. 10/06/23   Camara, Amadou, MD  dexlansoprazole (DEXILANT) 60 MG capsule Take 60 mg by mouth daily.    [provider]  diclofenac sodium (VOLTAREN) 1 % GEL Apply 2 g topically as needed (pain).     [provider]  isosorbide  mononitrate (IMDUR ) 30 MG 24 hr tablet Take 1 tablet (30 mg total) by mouth daily. 05/12/23   Singh, Prashant K, MD  JARDIANCE 25 MG TABS tablet Take 25 mg by mouth daily.    [provider]  LINZESS  290 MCG CAPS capsule Take 290 mcg by mouth daily.    [provider]  lubiprostone (AMITIZA) 24 MCG capsule Take  24 mcg by mouth 2 (two) times daily with a meal.    [provider]  meclizine  (ANTIVERT ) 25 MG tablet Take 25 mg by mouth 2 (two) times daily.    [provider]  metFORMIN  (GLUCOPHAGE ) 500 MG tablet Take 500 mg by mouth 2 (two) times daily.    [provider]  metoprolol  tartrate (LOPRESSOR ) 25 MG tablet TAKE 1 TABLET BY MOUTH TWICE A DAY 12/08/22   Court Dorn PARAS, MD  polyethylene glycol (MIRALAX ) 17 g packet Take 17 g by mouth daily as needed for moderate constipation or mild constipation (if no bowel movement in 2 days). 05/15/22   LampteyAleene KIDD, MD  rosuvastatin  (CRESTOR ) 20 MG tablet Take 20 mg by mouth daily.    [provider]  saxagliptin HCl (ONGLYZA) 5 MG TABS tablet Take 5 mg by mouth daily.    [provider]  senna-docusate (SENOKOT-S) 8.6-50 MG tablet Take 1 tablet by mouth at bedtime. 05/15/22   Blaise Aleene KIDD, MD  tamsulosin  (FLOMAX ) 0.4 MG CAPS capsule Take 0.4 mg by mouth daily.     [provider]    Allergies: Patient has no known allergies.    Review of Systems  Respiratory:         Inhalation    Updated Vital Signs BP 121/69 (BP Location: Left Arm)   Pulse ROLLEN)  59   Temp 97.8 F (36.6 C) (Oral)   Resp 16   Ht 5' 7 (1.702 m)   Wt 72.1 kg   SpO2 100%   BMI 24.90 kg/m   Physical Exam Vitals and nursing note reviewed.  Constitutional:      General: He is not in acute distress.    Appearance: He is not ill-appearing or toxic-appearing.  HENT:     Head: Normocephalic and atraumatic.     Mouth/Throat:     Mouth: Mucous membranes are moist.  Eyes:     General: No scleral icterus.       Right eye: No discharge.        Left eye: No discharge.     Conjunctiva/sclera: Conjunctivae normal.  Cardiovascular:     Rate and Rhythm: Normal rate and regular rhythm.     Pulses: Normal pulses.     Heart sounds: Normal heart sounds. No murmur heard. Pulmonary:     Effort: Pulmonary effort is normal. No  respiratory distress.     Breath sounds: Normal breath sounds. No wheezing, rhonchi or rales.  Abdominal:     General: Abdomen is flat. Bowel sounds are normal.     Palpations: Abdomen is soft.  Musculoskeletal:     Right lower leg: No edema.     Left lower leg: No edema.  Skin:    General: Skin is warm and dry.     Findings: No rash.  Neurological:     General: No focal deficit present.     Mental Status: He is alert. Mental status is at baseline.  Psychiatric:        Mood and Affect: Mood normal.        Behavior: Behavior normal.     (all labs ordered are listed, but only abnormal results are displayed) Labs Reviewed  COMPREHENSIVE METABOLIC PANEL WITH GFR - Abnormal; Notable for the following components:      Result Value   Glucose, Bld 105 (*)    Creatinine, Ser 1.32 (*)    GFR, Estimated 53 (*)    All other components within normal limits  CBC WITH DIFFERENTIAL/PLATELET - Abnormal; Notable for the following components:   WBC 3.0 (*)    Hemoglobin 10.9 (*)    HCT 35.8 (*)    MCH 25.8 (*)    RDW 16.3 (*)    Lymphs Abs 0.6 (*)    All other components within normal limits  URINALYSIS, W/ REFLEX TO CULTURE (INFECTION SUSPECTED) - Abnormal; Notable for the following components:   Glucose, UA >=500 (*)    All other components within normal limits    EKG: EKG Interpretation Date/Time:  Saturday November 07 2023 12:49:40 EDT Ventricular Rate:  57 PR Interval:  217 QRS Duration:  84 QT Interval:  395 QTC Calculation: 385 R Axis:   -20  Text Interpretation: Sinus rhythm Borderline prolonged PR interval Nonspecific T wave abnormality Confirmed by Bernard Drivers ([id number]) on 11/07/2023 1:09:36 PM  Radiology: No results found.   Procedures   Medications Ordered in the ED - No data to display                                  Medical Decision Making Amount and/or Complexity of Data Reviewed Labs: ordered.   This patient presents to the ED for concern of bleach  inhalation, this involves an extensive number of treatment options, and is a  complaint that carries with it a high risk of complications and morbidity.  The differential diagnosis includes toxic exposure, etc.    Co morbidities that complicate the patient evaluation  COPD, dementia, DM, HLD, HTN, CVA    Additional history obtained:  Dr. Shelda PCP    Problem List / ED Course / Critical interventions / Medication management  Patient presents ED concern for Clorox inhalation yesterday.  Patient stating that he was feeling terrible and SOB yesterday which is since resolved.  Patient afebrile with stable vitals. Patient tolerating PO intake. Patient telling RN that he might have UTI as well.  I Ordered, and personally interpreted labs.  UA without concern for infection.  CBC without leukocytosis.  There is mild anemia with hemoglobin of 10.9 which is at patient's baseline.  CMP with mild elevation in creatinine at 1.32. The patient was maintained on a cardiac monitor.  I personally viewed and interpreted the cardiac monitored which showed an underlying rhythm of: Sinus rhythm. Shared results with patient.  Answered questions.  Recommended following up with PCP.  Patient verbalized understanding of the plan. Staffed with Dr. Bernard who agrees with plan. I have reviewed the patients home medicines and have made adjustments as needed The patient has been appropriately medically screened and/or stabilized in the ED. I have low suspicion for any other emergent medical condition which would require further screening, evaluation or treatment in the ED or require inpatient management. At time of discharge the patient is hemodynamically stable and in no acute distress. I have discussed work-up results and diagnosis with patient and answered all questions. Patient is agreeable with discharge plan. We discussed strict return precautions for returning to the emergency department and they verbalized  understanding.     Social Determinants of Health:  geriatric      Final diagnoses:  Inhalation of cleaning agent, accidental or unintentional, initial encounter    ED Discharge Orders     None          Hoy Nidia FALCON, [place] 11/07/23 1524    Bernard Drivers, MD 11/08/23 [phone]

## 2023-11-07 NOTE — Discharge Instructions (Signed)
 As discussed, please follow up with primary care. Seek emergency care if experiencing any new or worsening symptoms.

## 2023-11-17 ENCOUNTER — Other Ambulatory Visit: Payer: Self-pay

## 2023-11-17 ENCOUNTER — Emergency Department (HOSPITAL_COMMUNITY)

## 2023-11-17 ENCOUNTER — Encounter (HOSPITAL_COMMUNITY): Payer: Self-pay

## 2023-11-17 ENCOUNTER — Inpatient Hospital Stay (HOSPITAL_COMMUNITY)
Admission: EM | Admit: 2023-11-17 | Discharge: 2023-11-27 | DRG: 564 | Disposition: A | Discharge status: Skilled Nursing Facility | Attending: Internal Medicine | Admitting: Internal Medicine

## 2023-11-17 DIAGNOSIS — I10 Essential (primary) hypertension: Secondary | ICD-10-CM | POA: Diagnosis present

## 2023-11-17 DIAGNOSIS — N179 Acute kidney failure, unspecified: Secondary | ICD-10-CM | POA: Diagnosis present

## 2023-11-17 DIAGNOSIS — E860 Dehydration: Secondary | ICD-10-CM | POA: Diagnosis present

## 2023-11-17 DIAGNOSIS — E8721 Acute metabolic acidosis: Secondary | ICD-10-CM | POA: Diagnosis present

## 2023-11-17 DIAGNOSIS — Z7409 Other reduced mobility: Secondary | ICD-10-CM | POA: Diagnosis present

## 2023-11-17 DIAGNOSIS — Z79899 Other long term (current) drug therapy: Secondary | ICD-10-CM

## 2023-11-17 DIAGNOSIS — N183 Chronic kidney disease, stage 3 unspecified: Secondary | ICD-10-CM | POA: Diagnosis present

## 2023-11-17 DIAGNOSIS — Z923 Personal history of irradiation: Secondary | ICD-10-CM

## 2023-11-17 DIAGNOSIS — E119 Type 2 diabetes mellitus without complications: Secondary | ICD-10-CM | POA: Diagnosis not present

## 2023-11-17 DIAGNOSIS — R7989 Other specified abnormal findings of blood chemistry: Secondary | ICD-10-CM | POA: Diagnosis not present

## 2023-11-17 DIAGNOSIS — Z8546 Personal history of malignant neoplasm of prostate: Secondary | ICD-10-CM

## 2023-11-17 DIAGNOSIS — R296 Repeated falls: Secondary | ICD-10-CM | POA: Diagnosis present

## 2023-11-17 DIAGNOSIS — G8929 Other chronic pain: Secondary | ICD-10-CM | POA: Diagnosis present

## 2023-11-17 DIAGNOSIS — M545 Low back pain, unspecified: Secondary | ICD-10-CM | POA: Diagnosis present

## 2023-11-17 DIAGNOSIS — W1830XA Fall on same level, unspecified, initial encounter: Secondary | ICD-10-CM | POA: Diagnosis present

## 2023-11-17 DIAGNOSIS — H109 Unspecified conjunctivitis: Secondary | ICD-10-CM | POA: Diagnosis present

## 2023-11-17 DIAGNOSIS — Z8673 Personal history of transient ischemic attack (TIA), and cerebral infarction without residual deficits: Secondary | ICD-10-CM

## 2023-11-17 DIAGNOSIS — Z9049 Acquired absence of other specified parts of digestive tract: Secondary | ICD-10-CM

## 2023-11-17 DIAGNOSIS — M4802 Spinal stenosis, cervical region: Secondary | ICD-10-CM | POA: Diagnosis present

## 2023-11-17 DIAGNOSIS — Z716 Tobacco abuse counseling: Secondary | ICD-10-CM

## 2023-11-17 DIAGNOSIS — D649 Anemia, unspecified: Secondary | ICD-10-CM | POA: Diagnosis present

## 2023-11-17 DIAGNOSIS — N400 Enlarged prostate without lower urinary tract symptoms: Secondary | ICD-10-CM | POA: Diagnosis present

## 2023-11-17 DIAGNOSIS — M6282 Rhabdomyolysis: Principal | ICD-10-CM | POA: Diagnosis present

## 2023-11-17 DIAGNOSIS — D631 Anemia in chronic kidney disease: Secondary | ICD-10-CM | POA: Diagnosis present

## 2023-11-17 DIAGNOSIS — E870 Hyperosmolality and hypernatremia: Secondary | ICD-10-CM | POA: Diagnosis present

## 2023-11-17 DIAGNOSIS — I129 Hypertensive chronic kidney disease with stage 1 through stage 4 chronic kidney disease, or unspecified chronic kidney disease: Secondary | ICD-10-CM | POA: Diagnosis present

## 2023-11-17 DIAGNOSIS — Z7984 Long term (current) use of oral hypoglycemic drugs: Secondary | ICD-10-CM

## 2023-11-17 DIAGNOSIS — E785 Hyperlipidemia, unspecified: Secondary | ICD-10-CM | POA: Diagnosis present

## 2023-11-17 DIAGNOSIS — T796XXA Traumatic ischemia of muscle, initial encounter: Principal | ICD-10-CM | POA: Diagnosis present

## 2023-11-17 DIAGNOSIS — E1122 Type 2 diabetes mellitus with diabetic chronic kidney disease: Secondary | ICD-10-CM | POA: Diagnosis present

## 2023-11-17 DIAGNOSIS — F17210 Nicotine dependence, cigarettes, uncomplicated: Secondary | ICD-10-CM | POA: Diagnosis present

## 2023-11-17 DIAGNOSIS — N3041 Irradiation cystitis with hematuria: Secondary | ICD-10-CM | POA: Diagnosis not present

## 2023-11-17 DIAGNOSIS — L89109 Pressure ulcer of unspecified part of back, unspecified stage: Secondary | ICD-10-CM | POA: Diagnosis present

## 2023-11-17 DIAGNOSIS — G9341 Metabolic encephalopathy: Secondary | ICD-10-CM | POA: Diagnosis present

## 2023-11-17 DIAGNOSIS — L89529 Pressure ulcer of left ankle, unspecified stage: Secondary | ICD-10-CM | POA: Diagnosis present

## 2023-11-17 DIAGNOSIS — J449 Chronic obstructive pulmonary disease, unspecified: Secondary | ICD-10-CM | POA: Diagnosis present

## 2023-11-17 DIAGNOSIS — R1313 Dysphagia, pharyngeal phase: Secondary | ICD-10-CM | POA: Diagnosis present

## 2023-11-17 DIAGNOSIS — I639 Cerebral infarction, unspecified: Secondary | ICD-10-CM | POA: Diagnosis present

## 2023-11-17 DIAGNOSIS — Z9181 History of falling: Secondary | ICD-10-CM

## 2023-11-17 DIAGNOSIS — L89152 Pressure ulcer of sacral region, stage 2: Secondary | ICD-10-CM | POA: Diagnosis present

## 2023-11-17 DIAGNOSIS — I69354 Hemiplegia and hemiparesis following cerebral infarction affecting left non-dominant side: Secondary | ICD-10-CM

## 2023-11-17 DIAGNOSIS — F0390 Unspecified dementia without behavioral disturbance: Secondary | ICD-10-CM | POA: Diagnosis present

## 2023-11-17 DIAGNOSIS — Z7982 Long term (current) use of aspirin: Secondary | ICD-10-CM

## 2023-11-17 DIAGNOSIS — L89219 Pressure ulcer of right hip, unspecified stage: Secondary | ICD-10-CM | POA: Diagnosis present

## 2023-11-17 DIAGNOSIS — E538 Deficiency of other specified B group vitamins: Secondary | ICD-10-CM | POA: Diagnosis present

## 2023-11-17 LAB — CBC WITH DIFFERENTIAL/PLATELET
Abs Immature Granulocytes: 0.02 K/uL (ref 0.00–0.07)
Basophils Absolute: 0 K/uL (ref 0.0–0.1)
Basophils Relative: 0 %
Eosinophils Absolute: 0 K/uL (ref 0.0–0.5)
Eosinophils Relative: 0 %
HCT: 39.6 % (ref 39.0–52.0)
Hemoglobin: 12.3 g/dL — ABNORMAL LOW (ref 13.0–17.0)
Immature Granulocytes: 0 %
Lymphocytes Relative: 4 %
Lymphs Abs: 0.3 K/uL — ABNORMAL LOW (ref 0.7–4.0)
MCH: 26.3 pg (ref 26.0–34.0)
MCHC: 31.1 g/dL (ref 30.0–36.0)
MCV: 84.8 fL (ref 80.0–100.0)
Monocytes Absolute: 0.6 K/uL (ref 0.1–1.0)
Monocytes Relative: 8 %
Neutro Abs: 6.3 K/uL (ref 1.7–7.7)
Neutrophils Relative %: 88 %
Platelets: 280 K/uL (ref 150–400)
RBC: 4.67 MIL/uL (ref 4.22–5.81)
RDW: 16.5 % — ABNORMAL HIGH (ref 11.5–15.5)
Smear Review: NORMAL
WBC: 7.2 K/uL (ref 4.0–10.5)
nRBC: 0 % (ref 0.0–0.2)

## 2023-11-17 LAB — I-STAT CHEM 8, ED
BUN: 68 mg/dL — ABNORMAL HIGH (ref 8–23)
Calcium, Ion: 1.06 mmol/L — ABNORMAL LOW (ref 1.15–1.40)
Chloride: 114 mmol/L — ABNORMAL HIGH (ref 98–111)
Creatinine, Ser: 3.3 mg/dL — ABNORMAL HIGH (ref 0.61–1.24)
Glucose, Bld: 107 mg/dL — ABNORMAL HIGH (ref 70–99)
HCT: 32 % — ABNORMAL LOW (ref 39.0–52.0)
Hemoglobin: 10.9 g/dL — ABNORMAL LOW (ref 13.0–17.0)
Potassium: 3.4 mmol/L — ABNORMAL LOW (ref 3.5–5.1)
Sodium: 146 mmol/L — ABNORMAL HIGH (ref 135–145)
TCO2: 14 mmol/L — ABNORMAL LOW (ref 22–32)

## 2023-11-17 LAB — CK: Total CK: 2573 U/L — ABNORMAL HIGH (ref 49–397)

## 2023-11-17 LAB — COMPREHENSIVE METABOLIC PANEL WITH GFR
ALT: 51 U/L — ABNORMAL HIGH (ref 0–44)
AST: 109 U/L — ABNORMAL HIGH (ref 15–41)
Albumin: 3.4 g/dL — ABNORMAL LOW (ref 3.5–5.0)
Alkaline Phosphatase: 54 U/L (ref 38–126)
Anion gap: 25 — ABNORMAL HIGH (ref 5–15)
BUN: 88 mg/dL — ABNORMAL HIGH (ref 8–23)
CO2: 14 mmol/L — ABNORMAL LOW (ref 22–32)
Calcium: 9.2 mg/dL (ref 8.9–10.3)
Chloride: 106 mmol/L (ref 98–111)
Creatinine, Ser: 3.95 mg/dL — ABNORMAL HIGH (ref 0.61–1.24)
GFR, Estimated: 14 mL/min — ABNORMAL LOW (ref >60)
Glucose, Bld: 120 mg/dL — ABNORMAL HIGH (ref 70–99)
Potassium: 4.2 mmol/L (ref 3.5–5.1)
Sodium: 145 mmol/L (ref 135–145)
Total Bilirubin: 1.4 mg/dL — ABNORMAL HIGH (ref 0.0–1.2)
Total Protein: 6.7 g/dL (ref 6.5–8.1)

## 2023-11-17 LAB — I-STAT CG4 LACTIC ACID, ED
Lactic Acid, Venous: 1.1 mmol/L (ref 0.5–1.9)
Lactic Acid, Venous: 0.7 mmol/L (ref 0.5–1.9)

## 2023-11-17 MED ORDER — LACTATED RINGERS IV SOLN: INTRAVENOUS | Status: AC

## 2023-11-17 MED ORDER — SODIUM CHLORIDE 0.9 % IV BOLUS
1000.0000 mL | Freq: Once | INTRAVENOUS | Status: AC
  Administered 2023-11-17: 1000 mL via INTRAVENOUS

## 2023-11-17 NOTE — ED Provider Notes (Signed)
 [place] EMERGENCY DEPARTMENT AT [facility] Provider Note   CSN: [id number] Arrival date & time: 11/17/23  2041     Patient presents with: Peter Mann   Peter Mann is a 83 y.o. male with past medical history seen for hypertension, hyperlipidemia, previous CVA, diabetes, history of tobacco abuse, BPH, dementia who presents with concern for altered mental status, last known well 4 days ago, possible fall, slurred speech.  Family had not seen him in several days and sent out for well check.  EMS found him on the ground.  Patient denies falling, reports that he just laid down on the ground.  He cannot tell me why.  He is alert and oriented x 3 however.  Denies any fever, chills.  Some tenderness with palpation of the backside, but denies any pain at rest.    Fall       Prior to Admission medications   Medication Sig Start Date End Date Taking? Authorizing Provider  acetaminophen  (TYLENOL ) 500 MG tablet Take 1,000 mg by mouth every 6 (six) hours as needed for mild pain or headache.    [provider]  ALPHAGAN  P 0.1 % SOLN Place 1 drop into both eyes 2 (two) times daily. 04/01/23   [provider]  amLODipine  (NORVASC ) 10 MG tablet Take 10 mg by mouth daily.    [provider]  aspirin  325 MG tablet Take 1 tablet (325 mg total) by mouth daily. 08/27/15   Jerri Keys, MD  CEQUA  0.09 % SOLN Place 1 drop into both eyes 2 (two) times daily.    [provider]  cyanocobalamin (VITAMIN B12) 1000 MCG tablet Take 1 tablet (1,000 mcg total) by mouth daily. 10/06/23   Camara, Amadou, MD  dexlansoprazole (DEXILANT) 60 MG capsule Take 60 mg by mouth daily.    [provider]  diclofenac sodium (VOLTAREN) 1 % GEL Apply 2 g topically as needed (pain).     [provider]  isosorbide  mononitrate (IMDUR ) 30 MG 24 hr tablet Take 1 tablet (30 mg total) by mouth daily. 05/12/23   Singh, Prashant K, MD  JARDIANCE 25 MG TABS tablet Take 25 mg by mouth  daily.    [provider]  LINZESS  290 MCG CAPS capsule Take 290 mcg by mouth daily.    [provider]  lubiprostone (AMITIZA) 24 MCG capsule Take 24 mcg by mouth 2 (two) times daily with a meal.    [provider]  meclizine  (ANTIVERT ) 25 MG tablet Take 25 mg by mouth 2 (two) times daily.    [provider]  metFORMIN  (GLUCOPHAGE ) 500 MG tablet Take 500 mg by mouth 2 (two) times daily.    [provider]  metoprolol  tartrate (LOPRESSOR ) 25 MG tablet TAKE 1 TABLET BY MOUTH TWICE A DAY 12/08/22   Court Dorn PARAS, MD  polyethylene glycol (MIRALAX ) 17 g packet Take 17 g by mouth daily as needed for moderate constipation or mild constipation (if no bowel movement in 2 days). 05/15/22   LampteyAleene KIDD, MD  rosuvastatin  (CRESTOR ) 20 MG tablet Take 20 mg by mouth daily.    [provider]  saxagliptin HCl (ONGLYZA) 5 MG TABS tablet Take 5 mg by mouth daily.    [provider]  senna-docusate (SENOKOT-S) 8.6-50 MG tablet Take 1 tablet by mouth at bedtime. 05/15/22   Blaise Aleene KIDD, MD  tamsulosin  (FLOMAX ) 0.4 MG CAPS capsule Take 0.4 mg by mouth daily.     [provider]  Allergies: Patient has no known allergies.    Review of Systems  All other systems reviewed and are negative.   Updated Vital Signs BP (!) 142/76   Pulse 98   Temp 98.3 F (36.8 C) (Rectal)   Resp 13   Ht 5' 7 (1.702 m)   Wt 71 kg   SpO2 98%   BMI 24.52 kg/m   Physical Exam Vitals and nursing note reviewed.  Constitutional:      General: He is not in acute distress.    Appearance: Normal appearance.  HENT:     Head: Normocephalic and atraumatic.     Mouth/Throat:     Mouth: Mucous membranes are dry.     Comments: Overall dry mucous membranes, cracked lips, crusting around lips and teeth Eyes:     General:        Right eye: No discharge.        Left eye: No discharge.  Cardiovascular:     Rate and Rhythm: Regular rhythm. Tachycardia  present.     Heart sounds: No murmur heard.    No friction rub. No gallop.  Pulmonary:     Effort: Pulmonary effort is normal.     Breath sounds: Normal breath sounds.  Abdominal:     General: Bowel sounds are normal.     Palpations: Abdomen is soft.  Genitourinary:    Comments: Normal appearance of rectum, soft brown stool in rectal vault Skin:    General: Skin is warm and dry.     Capillary Refill: Capillary refill takes less than 2 seconds.     Comments: Areas of skin redness without skin breakdown noted on left hip, sacrum, and mid back  Neurological:     Mental Status: He is alert and oriented to person, place, and time.  Psychiatric:        Mood and Affect: Mood normal.        Behavior: Behavior normal.     (all labs ordered are listed, but only abnormal results are displayed) Labs Reviewed  COMPREHENSIVE METABOLIC PANEL WITH GFR - Abnormal; Notable for the following components:      Result Value   CO2 14 (*)    Glucose, Bld 120 (*)    BUN 88 (*)    Creatinine, Ser 3.95 (*)    Albumin 3.4 (*)    AST 109 (*)    ALT 51 (*)    Total Bilirubin 1.4 (*)    GFR, Estimated 14 (*)    Anion gap 25 (*)    All other components within normal limits  CBC WITH DIFFERENTIAL/PLATELET - Abnormal; Notable for the following components:   Hemoglobin 12.3 (*)    RDW 16.5 (*)    Lymphs Abs 0.3 (*)    All other components within normal limits  CK - Abnormal; Notable for the following components:   Total CK 2,573 (*)    All other components within normal limits  I-STAT CHEM 8, ED - Abnormal; Notable for the following components:   Sodium 146 (*)    Potassium 3.4 (*)    Chloride 114 (*)    BUN 68 (*)    Creatinine, Ser 3.30 (*)    Glucose, Bld 107 (*)    Calcium , Ion 1.06 (*)    TCO2 14 (*)    Hemoglobin 10.9 (*)    HCT 32.0 (*)    All other components within normal limits  URINALYSIS, W/ REFLEX TO CULTURE (INFECTION SUSPECTED)  I-STAT CG4 LACTIC  ACID, ED  CBG MONITORING, ED   I-STAT CG4 LACTIC ACID, ED    EKG: None  Radiology: CT Cervical Spine Wo Contrast Result Date: 11/17/2023 CLINICAL DATA:  Head trauma EXAM: CT HEAD WITHOUT CONTRAST CT CERVICAL SPINE WITHOUT CONTRAST TECHNIQUE: Multidetector CT imaging of the head and cervical spine was performed following the standard protocol without intravenous contrast. Multiplanar CT image reconstructions of the cervical spine were also generated. RADIATION DOSE REDUCTION: This exam was performed according to the departmental dose-optimization program which includes automated exposure control, adjustment of the mA and/or kV according to patient size and/or use of iterative reconstruction technique. COMPARISON:  CT brain 05/08/2023, MRI 08/23/2015, MRI cervical spine 12/01/2013 FINDINGS: CT HEAD FINDINGS Brain: No acute territorial infarction, hemorrhage or intracranial mass. Moderate advanced atrophy. Moderate chronic small vessel ischemic changes of the white matter. Prominent ventricles felt secondary to atrophy. Vascular: No hyperdense vessels.  Carotid vascular calcification Skull: Normal. Negative for fracture or focal lesion. Sinuses/Orbits: Patchy mucosal thickening Other: None CT CERVICAL SPINE FINDINGS Alignment: Facet alignment is maintained. Straightening of the cervical spine. No subluxation. Skull base and vertebrae: No definitive fracture. Soft tissues and spinal canal: No prevertebral fluid or swelling. No visible canal hematoma. Disc levels: Partial ankylosis C4 through C7. Bulky anterior osteophytes. Multilevel degenerative disc space narrowing. Fusion of the facets at multiple levels with facet degenerative change. Severe bilateral foraminal narrowing C3-C4 and C6-C7 and C7-T1. Large posterior disc osteophyte at C3-C4 results in severe canal stenosis. Relative widening of anterior disc space/osteophytes at C3 C4. Upper chest: Negative. Other: None IMPRESSION: 1. No CT evidence for acute intracranial abnormality.  Atrophy and chronic small vessel ischemic changes of the white matter. 2. Straightening of the cervical spine with multilevel degenerative changes. No definitive acute fracture is seen. 3. Relative widening of anterior disc space/osteophytes at C3-C4, of uncertain significance. If there is high clinical suspicion for ligamentous injury, correlation with MRI could be obtained. Electronically Signed   By: Luke Bun M.D.   On: 11/17/2023 22:30   CT Head Wo Contrast Result Date: 11/17/2023 CLINICAL DATA:  Head trauma EXAM: CT HEAD WITHOUT CONTRAST CT CERVICAL SPINE WITHOUT CONTRAST TECHNIQUE: Multidetector CT imaging of the head and cervical spine was performed following the standard protocol without intravenous contrast. Multiplanar CT image reconstructions of the cervical spine were also generated. RADIATION DOSE REDUCTION: This exam was performed according to the departmental dose-optimization program which includes automated exposure control, adjustment of the mA and/or kV according to patient size and/or use of iterative reconstruction technique. COMPARISON:  CT brain 05/08/2023, MRI 08/23/2015, MRI cervical spine 12/01/2013 FINDINGS: CT HEAD FINDINGS Brain: No acute territorial infarction, hemorrhage or intracranial mass. Moderate advanced atrophy. Moderate chronic small vessel ischemic changes of the white matter. Prominent ventricles felt secondary to atrophy. Vascular: No hyperdense vessels.  Carotid vascular calcification Skull: Normal. Negative for fracture or focal lesion. Sinuses/Orbits: Patchy mucosal thickening Other: None CT CERVICAL SPINE FINDINGS Alignment: Facet alignment is maintained. Straightening of the cervical spine. No subluxation. Skull base and vertebrae: No definitive fracture. Soft tissues and spinal canal: No prevertebral fluid or swelling. No visible canal hematoma. Disc levels: Partial ankylosis C4 through C7. Bulky anterior osteophytes. Multilevel degenerative disc space  narrowing. Fusion of the facets at multiple levels with facet degenerative change. Severe bilateral foraminal narrowing C3-C4 and C6-C7 and C7-T1. Large posterior disc osteophyte at C3-C4 results in severe canal stenosis. Relative widening of anterior disc space/osteophytes at C3 C4. Upper chest: Negative. Other: None IMPRESSION: 1.  No CT evidence for acute intracranial abnormality. Atrophy and chronic small vessel ischemic changes of the white matter. 2. Straightening of the cervical spine with multilevel degenerative changes. No definitive acute fracture is seen. 3. Relative widening of anterior disc space/osteophytes at C3-C4, of uncertain significance. If there is high clinical suspicion for ligamentous injury, correlation with MRI could be obtained. Electronically Signed   By: Luke Bun M.D.   On: 11/17/2023 22:30   DG Chest Portable 1 View Result Date: 11/17/2023 CLINICAL DATA:  [place] EXAM: PORTABLE CHEST 1 VIEW COMPARISON:  05/08/2023 FINDINGS: Single frontal view of the chest demonstrates an unremarkable cardiac silhouette. No airspace disease, effusion, or pneumothorax. No acute bony abnormalities. IMPRESSION: 1. No acute intrathoracic process. Electronically Signed   By: Ozell Daring M.D.   On: 11/17/2023 21:12   DG Pelvis Portable Result Date: 11/17/2023 CLINICAL DATA:  [place] EXAM: PORTABLE PELVIS 1-2 VIEWS COMPARISON:  05/08/2023 FINDINGS: There is no evidence of pelvic fracture or diastasis. No pelvic bone lesions are seen. IMPRESSION: No acute bony abnormality. Electronically Signed   By: Franky Crease M.D.   On: 11/17/2023 21:11     Procedures   Medications Ordered in the ED  sodium chloride  0.9 % bolus 1,000 mL (0 mLs Intravenous Stopped 11/17/23 2227)  sodium chloride  0.9 % bolus 1,000 mL (1,000 mLs Intravenous New Bag/Given 11/17/23 2242)                                    Medical Decision Making Amount and/or Complexity of Data Reviewed Labs: ordered. Radiology:  ordered.   This patient is a 83 y.o. male  who presents to the ED for concern of weakness, ams.   Differential diagnoses prior to evaluation: The emergent differential diagnosis includes, but is not limited to,  CVA, spinal cord injury, ACS, arrhythmia, syncope, orthostatic hypotension, sepsis, hypoglycemia, hypoxia, electrolyte disturbance, endocrine disorder, anemia, environmental exposure, polypharmacy, seizure, hypotension, sepsis, hypoglycemia, hypoxic encephalopathy, metabolic encephalopathy, polypharmacy, substance abuse, developing dementia or alzheimers, meningitis, encephalitis, hypertensive emergency, other systemic infection, acute alcohol  intoxication, acute alcohol  or other drug withdrawal or psychiatric manifestation vs other . This is not an exhaustive differential.  Overall based on his clinical appearance, suspect dehydration, dementia, possible rhabdomyolysis  Past Medical History / Co-morbidities / Social History: hypertension, hyperlipidemia, previous CVA, diabetes, history of tobacco abuse, BPH, dementia  Additional history: Chart reviewed. Pertinent results include: Reviewed lab work, imaging from previous emergency department visits, hospitalizations  Physical Exam: Physical exam performed. The pertinent findings include: Vital signs stable other than mild tachycardia, pulse 103, blood pressure 141/94. Areas of skin redness without skin breakdown noted on left hip, sacrum, and mid back. Normal appearance of rectum, soft brown stool in rectal vault. Overall dry mucous membranes, cracked lips, crusting around lips and teeth.  Moves all 4 limbs spontaneously, alert and oriented x 3  Lab Tests/Imaging studies: I personally interpreted labs/imaging and the pertinent results include: CBC with mild anemia, hemoglobin 12.3, otherwise overall unremarkable.  His CMP is notable for significant bicarb deficit, CO2 14, anion gap of 25.  His BUN and creatinine are significantly elevated  from his baseline, 88, 3.95 respectively.  Mild transaminitis, AST 109, ALT 51.  Mild elevated bilirubin at 1.4.  Findings consistent with acute rhabdomyolysis especially in context of CK of 2573.  Initial lactic acid normal at 1.1.. CT head, C spine without any acute traumatic findings. I agree  with the radiologist interpretation.  Cardiac monitoring: EKG obtained and interpreted by myself and attending physician which shows: NSR   Medications: I ordered medication including multiple fluid bolus for AKI, acute rhabdomyolysis.  I have reviewed the patients home medicines and have made adjustments as needed.   Consults: Spoke with hospitalist, Dr. Franky who agrees to admission for rhabdo, severe AKI.  Disposition: After consideration of the diagnostic results and the patients response to treatment, I feel that patient would benefit from admission as discussed above .    Final diagnoses:  Non-traumatic rhabdomyolysis  AKI (acute kidney injury) [facility])    ED Discharge Orders     None          Rosan Sherlean DEL, PA-C 11/17/23 2310    Francesca Elsie CROME, MD 11/17/23 2351

## 2023-11-17 NOTE — H&P (Signed)
 History and Physical    Peter Mann FMW:[id number] DOB: [birth year] DOA: 11/17/2023  Patient coming from: Home.  Chief Complaint: Found on the ground.  HPI: Peter Mann is a 83 y.o. male with history of CVA with left-sided weakness, diabetes mellitus type 2, hypertension, chronic anemia, chronic kidney disease stage III, history of prostate cancer was brought to the ER after patient was found on the ground.  As per the report patient's family had not seen him for last few days last known well was about 4 days ago.  Family sent out for a wellness check and EMS found patient on the floor confused and was brought to the ER.  Patient states he did not have a fall he just lay on the floor.  Patient as per the family has had recurrent falls.  ED Course: In the ER patient was found to be alert awake oriented x 3.  CT head and C-spine did not show anything acute.  Labs show acute renal failure with creatinine of 3.9 increasing from 1.3 in August 2025.  Also shows anemia which is chronic.  X-ray of the pelvis and chest does not show anything acute.  On my exam patient appears mildly confused but follows commands.  Patient CK level is also elevated.  Patient was given fluid bolus for the renal failure and admitted for further management.  Review of Systems: As per HPI, rest all negative.   Past Medical History:  Diagnosis Date   At low risk for fall    Cancer (HCC)    prostrate ca, radiation treatments   Chest pain    COPD (chronic obstructive pulmonary disease) (HCC)    Dementia (HCC)    Diabetes mellitus without complication (HCC)    Dizziness    History of tobacco abuse    Hyperlipidemia    Hypertension    Stroke [facility])    Subconjunctival hemorrhage of left eye 07/18/2019   The condition of the involved eye is that of a subconjunctival hemorrhage.  These are often spontaneous but are also often at or near the site of low location of an injection should to be placed into the eye.  In the  absence of direct blunt or severe trauma these will typically resolve on their own spontaneously and have no impact on the vision.    These are very similar to having a bruise in your    Past Surgical History:  Procedure Laterality Date   CHOLECYSTECTOMY N/A 01/30/2014   Procedure: LAPAROSCOPIC CHOLECYSTECTOMY;  Surgeon: [name], MD;  Location: [facility] OR;  Service: General;  Laterality: N/A;     reports that he quit smoking about 4 years ago. His smoking use included cigarettes. He has never used smokeless tobacco. He reports that he does not drink alcohol  and does not use drugs.  No Known Allergies  Family History  Problem Relation Age of Onset   Cancer Brother    Cancer Sister     Prior to Admission medications   Medication Sig Start Date End Date Taking? Authorizing Provider  acetaminophen  (TYLENOL ) 500 MG tablet Take 1,000 mg by mouth every 6 (six) hours as needed for mild pain or headache.    [provider]  ALPHAGAN  P 0.1 % SOLN Place 1 drop into both eyes 2 (two) times daily. 04/01/23   [provider]  amLODipine  (NORVASC ) 10 MG tablet Take 10 mg by mouth daily.    [provider]  aspirin  325 MG tablet Take 1 tablet (325 mg  total) by mouth daily. 08/27/15   Jerri Keys, MD  CEQUA  0.09 % SOLN Place 1 drop into both eyes 2 (two) times daily.    [provider]  cyanocobalamin (VITAMIN B12) 1000 MCG tablet Take 1 tablet (1,000 mcg total) by mouth daily. 10/06/23   Camara, Amadou, MD  dexlansoprazole (DEXILANT) 60 MG capsule Take 60 mg by mouth daily.    [provider]  diclofenac sodium (VOLTAREN) 1 % GEL Apply 2 g topically as needed (pain).     [provider]  isosorbide  mononitrate (IMDUR ) 30 MG 24 hr tablet Take 1 tablet (30 mg total) by mouth daily. 05/12/23   Singh, Prashant K, MD  JARDIANCE 25 MG TABS tablet Take 25 mg by mouth daily.    [provider]  LINZESS  290 MCG CAPS capsule Take 290 mcg by mouth daily.     [provider]  lubiprostone (AMITIZA) 24 MCG capsule Take 24 mcg by mouth 2 (two) times daily with a meal.    [provider]  meclizine  (ANTIVERT ) 25 MG tablet Take 25 mg by mouth 2 (two) times daily.    [provider]  metFORMIN  (GLUCOPHAGE ) 500 MG tablet Take 500 mg by mouth 2 (two) times daily.    [provider]  metoprolol  tartrate (LOPRESSOR ) 25 MG tablet TAKE 1 TABLET BY MOUTH TWICE A DAY 12/08/22   Court Dorn PARAS, MD  polyethylene glycol (MIRALAX ) 17 g packet Take 17 g by mouth daily as needed for moderate constipation or mild constipation (if no bowel movement in 2 days). 05/15/22   LampteyAleene KIDD, MD  rosuvastatin  (CRESTOR ) 20 MG tablet Take 20 mg by mouth daily.    [provider]  saxagliptin HCl (ONGLYZA) 5 MG TABS tablet Take 5 mg by mouth daily.    [provider]  senna-docusate (SENOKOT-S) 8.6-50 MG tablet Take 1 tablet by mouth at bedtime. 05/15/22   Blaise Aleene KIDD, MD  tamsulosin  (FLOMAX ) 0.4 MG CAPS capsule Take 0.4 mg by mouth daily.     [provider]    Physical Exam: Constitutional: Moderately built and nourished. Vitals:   11/17/23 2130 11/17/23 2145 11/17/23 2200 11/17/23 2215  BP: (!) 151/85 (!) 157/72 (!) 151/71 (!) 142/76  Pulse: (!) 102 94 95 98  Resp: 18 16 17 13   Temp:      TempSrc:      SpO2: 99% 100% 98% 98%  Weight:      Height:       Eyes: Anicteric no pallor. ENMT: No discharge from the ears eyes nose or mouth. Neck: No mass felt.  No neck rigidity. Respiratory: No rhonchi or crepitations. Cardiovascular: S1-S2 heard. Abdomen: Soft nontender bowel sound present. Musculoskeletal: No edema. Skin: No rash.  Multiple bruises. Neurologic: Alert awake but appears mildly confused but oriented to place and person.  Moving all extremities. Psychiatric: Confused.   Labs on Admission: I have personally reviewed following labs and imaging studies  CBC: Recent Labs  Lab  11/17/23 2033 11/17/23 2104  WBC 7.2  --   NEUTROABS 6.3  --   HGB 12.3* 10.9*  HCT 39.6 32.0*  MCV 84.8  --   PLT 280  --    Basic Metabolic Panel: Recent Labs  Lab 11/17/23 2033 11/17/23 2104  NA 145 146*  K 4.2 3.4*  CL 106 114*  CO2 14*  --   GLUCOSE 120* 107*  BUN 88* 68*  CREATININE 3.95* 3.30*  CALCIUM  9.2  --  GFR: Estimated Creatinine Clearance: 15.6 mL/min (A) (by C-G formula based on SCr of 3.3 mg/dL (H)). Liver Function Tests: Recent Labs  Lab 11/17/23 2033  AST 109*  ALT 51*  ALKPHOS 54  BILITOT 1.4*  PROT 6.7  ALBUMIN 3.4*   No results for input(s): LIPASE, AMYLASE in the last 168 hours. No results for input(s): AMMONIA in the last 168 hours. Coagulation Profile: No results for input(s): INR, PROTIME in the last 168 hours. Cardiac Enzymes: Recent Labs  Lab 11/17/23 2033  CKTOTAL 2,573*   BNP (last 3 results) No results for input(s): PROBNP in the last 8760 hours. HbA1C: No results for input(s): HGBA1C in the last 72 hours. CBG: No results for input(s): GLUCAP in the last 168 hours. Lipid Profile: No results for input(s): CHOL, HDL, LDLCALC, TRIG, CHOLHDL, LDLDIRECT in the last 72 hours. Thyroid  Function Tests: No results for input(s): TSH, T4TOTAL, FREET4, T3FREE, THYROIDAB in the last 72 hours. Anemia Panel: No results for input(s): VITAMINB12, FOLATE, FERRITIN, TIBC, IRON, RETICCTPCT in the last 72 hours. Urine analysis:    Component Value Date/Time   COLORURINE YELLOW 11/07/2023 1342   APPEARANCEUR CLEAR 11/07/2023 1342   LABSPEC 1.012 11/07/2023 1342   LABSPEC 1.010 01/19/2006 1117   PHURINE 5.0 11/07/2023 1342   GLUCOSEU >=500 (A) 11/07/2023 1342   HGBUR NEGATIVE 11/07/2023 1342   BILIRUBINUR NEGATIVE 11/07/2023 1342   BILIRUBINUR negative 05/07/2023 1045   BILIRUBINUR Negative 01/19/2006 1117   KETONESUR NEGATIVE 11/07/2023 1342   PROTEINUR NEGATIVE 11/07/2023 1342    UROBILINOGEN 1.0 05/07/2023 1045   UROBILINOGEN 0.2 08/31/2020 1208   NITRITE NEGATIVE 11/07/2023 1342   LEUKOCYTESUR NEGATIVE 11/07/2023 1342   LEUKOCYTESUR Negative 01/19/2006 1117   Sepsis Labs: @LABRCNTIP (procalcitonin:4,lacticidven:4) )No results found for this or any previous visit (from the past 240 hours).   Radiological Exams on Admission: CT Cervical Spine Wo Contrast Result Date: 11/17/2023 CLINICAL DATA:  Head trauma EXAM: CT HEAD WITHOUT CONTRAST CT CERVICAL SPINE WITHOUT CONTRAST TECHNIQUE: Multidetector CT imaging of the head and cervical spine was performed following the standard protocol without intravenous contrast. Multiplanar CT image reconstructions of the cervical spine were also generated. RADIATION DOSE REDUCTION: This exam was performed according to the departmental dose-optimization program which includes automated exposure control, adjustment of the mA and/or kV according to patient size and/or use of iterative reconstruction technique. COMPARISON:  CT brain 05/08/2023, MRI 08/23/2015, MRI cervical spine 12/01/2013 FINDINGS: CT HEAD FINDINGS Brain: No acute territorial infarction, hemorrhage or intracranial mass. Moderate advanced atrophy. Moderate chronic small vessel ischemic changes of the white matter. Prominent ventricles felt secondary to atrophy. Vascular: No hyperdense vessels.  Carotid vascular calcification Skull: Normal. Negative for fracture or focal lesion. Sinuses/Orbits: Patchy mucosal thickening Other: None CT CERVICAL SPINE FINDINGS Alignment: Facet alignment is maintained. Straightening of the cervical spine. No subluxation. Skull base and vertebrae: No definitive fracture. Soft tissues and spinal canal: No prevertebral fluid or swelling. No visible canal hematoma. Disc levels: Partial ankylosis C4 through C7. Bulky anterior osteophytes. Multilevel degenerative disc space narrowing. Fusion of the facets at multiple levels with facet degenerative change. Severe  bilateral foraminal narrowing C3-C4 and C6-C7 and C7-T1. Large posterior disc osteophyte at C3-C4 results in severe canal stenosis. Relative widening of anterior disc space/osteophytes at C3 C4. Upper chest: Negative. Other: None IMPRESSION: 1. No CT evidence for acute intracranial abnormality. Atrophy and chronic small vessel ischemic changes of the white matter. 2. Straightening of the cervical spine with multilevel degenerative changes. No definitive acute  fracture is seen. 3. Relative widening of anterior disc space/osteophytes at C3-C4, of uncertain significance. If there is high clinical suspicion for ligamentous injury, correlation with MRI could be obtained. Electronically Signed   By: Luke Bun M.D.   On: 11/17/2023 22:30   CT Head Wo Contrast Result Date: 11/17/2023 CLINICAL DATA:  Head trauma EXAM: CT HEAD WITHOUT CONTRAST CT CERVICAL SPINE WITHOUT CONTRAST TECHNIQUE: Multidetector CT imaging of the head and cervical spine was performed following the standard protocol without intravenous contrast. Multiplanar CT image reconstructions of the cervical spine were also generated. RADIATION DOSE REDUCTION: This exam was performed according to the departmental dose-optimization program which includes automated exposure control, adjustment of the mA and/or kV according to patient size and/or use of iterative reconstruction technique. COMPARISON:  CT brain 05/08/2023, MRI 08/23/2015, MRI cervical spine 12/01/2013 FINDINGS: CT HEAD FINDINGS Brain: No acute territorial infarction, hemorrhage or intracranial mass. Moderate advanced atrophy. Moderate chronic small vessel ischemic changes of the white matter. Prominent ventricles felt secondary to atrophy. Vascular: No hyperdense vessels.  Carotid vascular calcification Skull: Normal. Negative for fracture or focal lesion. Sinuses/Orbits: Patchy mucosal thickening Other: None CT CERVICAL SPINE FINDINGS Alignment: Facet alignment is maintained. Straightening of  the cervical spine. No subluxation. Skull base and vertebrae: No definitive fracture. Soft tissues and spinal canal: No prevertebral fluid or swelling. No visible canal hematoma. Disc levels: Partial ankylosis C4 through C7. Bulky anterior osteophytes. Multilevel degenerative disc space narrowing. Fusion of the facets at multiple levels with facet degenerative change. Severe bilateral foraminal narrowing C3-C4 and C6-C7 and C7-T1. Large posterior disc osteophyte at C3-C4 results in severe canal stenosis. Relative widening of anterior disc space/osteophytes at C3 C4. Upper chest: Negative. Other: None IMPRESSION: 1. No CT evidence for acute intracranial abnormality. Atrophy and chronic small vessel ischemic changes of the white matter. 2. Straightening of the cervical spine with multilevel degenerative changes. No definitive acute fracture is seen. 3. Relative widening of anterior disc space/osteophytes at C3-C4, of uncertain significance. If there is high clinical suspicion for ligamentous injury, correlation with MRI could be obtained. Electronically Signed   By: Luke Bun M.D.   On: 11/17/2023 22:30   DG Chest Portable 1 View Result Date: 11/17/2023 CLINICAL DATA:  [place] EXAM: PORTABLE CHEST 1 VIEW COMPARISON:  05/08/2023 FINDINGS: Single frontal view of the chest demonstrates an unremarkable cardiac silhouette. No airspace disease, effusion, or pneumothorax. No acute bony abnormalities. IMPRESSION: 1. No acute intrathoracic process. Electronically Signed   By: Ozell Daring M.D.   On: 11/17/2023 21:12   DG Pelvis Portable Result Date: 11/17/2023 CLINICAL DATA:  Fall EXAM: PORTABLE PELVIS 1-2 VIEWS COMPARISON:  05/08/2023 FINDINGS: There is no evidence of pelvic fracture or diastasis. No pelvic bone lesions are seen. IMPRESSION: No acute bony abnormality. Electronically Signed   By: Franky Crease M.D.   On: 11/17/2023 21:11    EKG: Independently reviewed.  Normal sinus  rhythm.  Assessment/Plan Principal Problem:   ARF (acute renal failure) (HCC)    Acute on chronic kidney disease stage III creatinine increasing from 1.3 last month and it is around 3.9 today with metabolic acidosis.  Patient received 2 L fluid bolus in the ER.  Will continue with LR infusion.  Follow metabolic panel. Rhabdomyolysis likely from fall.  Hold statins for now.  Follow CK levels continue hydration. Elevated LFTs could be from rhabdomyolysis.  Follow LFTs.  If any further worsening may consider imaging.  Hold statins for now. Acute encephalopathy -  appears mildly confused.  Will check MRI brain and ammonia levels TSH B12 levels. Diabetes mellitus type 2 last hemoglobin A1c in February 2025 was 8.2.  Patient takes per report Onglyza and metformin .  Presently on sliding scale coverage. Prior history of stroke on aspirin  holding statins due to rhabdomyolysis. Hypertension on metoprolol  amlodipine  Imdur . Chronic anemia follow CBC check anemia panel. BPH on Flomax .  Will need to get further history from family when available.  Since patient has acute renal failure with rhabdomyolysis will need close monitoring further workup and more than 2 midnight stay.   DVT prophylaxis: Heparin . Code Status: Full code. Family Communication: Unable to reach family. Disposition Plan: Monitored bed. Consults called: Physical therapy. Admission status: Observation.

## 2023-11-18 ENCOUNTER — Encounter (HOSPITAL_COMMUNITY): Payer: Self-pay | Admitting: Internal Medicine

## 2023-11-18 ENCOUNTER — Observation Stay (HOSPITAL_COMMUNITY)

## 2023-11-18 DIAGNOSIS — E785 Hyperlipidemia, unspecified: Secondary | ICD-10-CM | POA: Diagnosis present

## 2023-11-18 DIAGNOSIS — R7989 Other specified abnormal findings of blood chemistry: Secondary | ICD-10-CM | POA: Insufficient documentation

## 2023-11-18 DIAGNOSIS — E870 Hyperosmolality and hypernatremia: Secondary | ICD-10-CM | POA: Diagnosis present

## 2023-11-18 DIAGNOSIS — D631 Anemia in chronic kidney disease: Secondary | ICD-10-CM | POA: Diagnosis present

## 2023-11-18 DIAGNOSIS — G9341 Metabolic encephalopathy: Secondary | ICD-10-CM | POA: Diagnosis present

## 2023-11-18 DIAGNOSIS — N400 Enlarged prostate without lower urinary tract symptoms: Secondary | ICD-10-CM | POA: Diagnosis present

## 2023-11-18 DIAGNOSIS — N183 Chronic kidney disease, stage 3 unspecified: Secondary | ICD-10-CM | POA: Diagnosis present

## 2023-11-18 DIAGNOSIS — E860 Dehydration: Secondary | ICD-10-CM | POA: Diagnosis present

## 2023-11-18 DIAGNOSIS — G8929 Other chronic pain: Secondary | ICD-10-CM | POA: Diagnosis present

## 2023-11-18 DIAGNOSIS — L89219 Pressure ulcer of right hip, unspecified stage: Secondary | ICD-10-CM | POA: Diagnosis present

## 2023-11-18 DIAGNOSIS — J449 Chronic obstructive pulmonary disease, unspecified: Secondary | ICD-10-CM | POA: Diagnosis present

## 2023-11-18 DIAGNOSIS — N179 Acute kidney failure, unspecified: Secondary | ICD-10-CM | POA: Diagnosis present

## 2023-11-18 DIAGNOSIS — M4802 Spinal stenosis, cervical region: Secondary | ICD-10-CM | POA: Diagnosis present

## 2023-11-18 DIAGNOSIS — I129 Hypertensive chronic kidney disease with stage 1 through stage 4 chronic kidney disease, or unspecified chronic kidney disease: Secondary | ICD-10-CM | POA: Diagnosis present

## 2023-11-18 DIAGNOSIS — M6282 Rhabdomyolysis: Secondary | ICD-10-CM | POA: Insufficient documentation

## 2023-11-18 DIAGNOSIS — R296 Repeated falls: Secondary | ICD-10-CM | POA: Diagnosis present

## 2023-11-18 DIAGNOSIS — L89109 Pressure ulcer of unspecified part of back, unspecified stage: Secondary | ICD-10-CM | POA: Diagnosis present

## 2023-11-18 DIAGNOSIS — E1122 Type 2 diabetes mellitus with diabetic chronic kidney disease: Secondary | ICD-10-CM | POA: Diagnosis present

## 2023-11-18 DIAGNOSIS — F0390 Unspecified dementia without behavioral disturbance: Secondary | ICD-10-CM | POA: Diagnosis present

## 2023-11-18 DIAGNOSIS — E8721 Acute metabolic acidosis: Secondary | ICD-10-CM | POA: Diagnosis present

## 2023-11-18 DIAGNOSIS — F17210 Nicotine dependence, cigarettes, uncomplicated: Secondary | ICD-10-CM | POA: Diagnosis present

## 2023-11-18 DIAGNOSIS — N3041 Irradiation cystitis with hematuria: Secondary | ICD-10-CM | POA: Diagnosis not present

## 2023-11-18 DIAGNOSIS — T796XXA Traumatic ischemia of muscle, initial encounter: Secondary | ICD-10-CM | POA: Diagnosis present

## 2023-11-18 DIAGNOSIS — L89152 Pressure ulcer of sacral region, stage 2: Secondary | ICD-10-CM | POA: Diagnosis present

## 2023-11-18 DIAGNOSIS — W1830XA Fall on same level, unspecified, initial encounter: Secondary | ICD-10-CM | POA: Diagnosis present

## 2023-11-18 DIAGNOSIS — I69354 Hemiplegia and hemiparesis following cerebral infarction affecting left non-dominant side: Secondary | ICD-10-CM | POA: Diagnosis not present

## 2023-11-18 DIAGNOSIS — L89529 Pressure ulcer of left ankle, unspecified stage: Secondary | ICD-10-CM | POA: Diagnosis present

## 2023-11-18 LAB — FOLATE: Folate: 5.4 ng/mL — ABNORMAL LOW (ref >5.9)

## 2023-11-18 LAB — URINALYSIS, W/ REFLEX TO CULTURE (INFECTION SUSPECTED)
Bilirubin Urine: NEGATIVE
Glucose, UA: 50 mg/dL — AB
Ketones, ur: 5 mg/dL — AB
Nitrite: NEGATIVE
Protein, ur: 30 mg/dL — AB
Specific Gravity, Urine: 1.016 (ref 1.005–1.030)
pH: 5 (ref 5.0–8.0)

## 2023-11-18 LAB — BASIC METABOLIC PANEL WITH GFR
Anion gap: 17 — ABNORMAL HIGH (ref 5–15)
BUN: 87 mg/dL — ABNORMAL HIGH (ref 8–23)
CO2: 16 mmol/L — ABNORMAL LOW (ref 22–32)
Calcium: 8.5 mg/dL — ABNORMAL LOW (ref 8.9–10.3)
Chloride: 112 mmol/L — ABNORMAL HIGH (ref 98–111)
Creatinine, Ser: 3.63 mg/dL — ABNORMAL HIGH (ref 0.61–1.24)
GFR, Estimated: 16 mL/min — ABNORMAL LOW (ref >60)
Glucose, Bld: 106 mg/dL — ABNORMAL HIGH (ref 70–99)
Potassium: 3.7 mmol/L (ref 3.5–5.1)
Sodium: 145 mmol/L (ref 135–145)

## 2023-11-18 LAB — IRON AND TIBC
Iron: 17 ug/dL — ABNORMAL LOW (ref 45–182)
Saturation Ratios: 7 % — ABNORMAL LOW (ref 17.9–39.5)
TIBC: 230 ug/dL — ABNORMAL LOW (ref 250–450)
UIBC: 213 ug/dL

## 2023-11-18 LAB — GLUCOSE, CAPILLARY
Glucose-Capillary: 148 mg/dL — ABNORMAL HIGH (ref 70–99)
Glucose-Capillary: 92 mg/dL (ref 70–99)

## 2023-11-18 LAB — CBC WITH DIFFERENTIAL/PLATELET
Abs Immature Granulocytes: 0.01 10*3/uL (ref 0.00–0.07)
Basophils Absolute: 0 10*3/uL (ref 0.0–0.1)
Basophils Relative: 0 %
Eosinophils Absolute: 0 10*3/uL (ref 0.0–0.5)
Eosinophils Relative: 0 %
HCT: 33 % — ABNORMAL LOW (ref 39.0–52.0)
Hemoglobin: 10.4 g/dL — ABNORMAL LOW (ref 13.0–17.0)
Immature Granulocytes: 0 %
Lymphocytes Relative: 6 %
Lymphs Abs: 0.4 10*3/uL — ABNORMAL LOW (ref 0.7–4.0)
MCH: 26.5 pg (ref 26.0–34.0)
MCHC: 31.5 g/dL (ref 30.0–36.0)
MCV: 84 fL (ref 80.0–100.0)
Monocytes Absolute: 0.7 10*3/uL (ref 0.1–1.0)
Monocytes Relative: 11 %
Neutro Abs: 5.7 10*3/uL (ref 1.7–7.7)
Neutrophils Relative %: 83 %
Platelets: 251 10*3/uL (ref 150–400)
RBC: 3.93 MIL/uL — ABNORMAL LOW (ref 4.22–5.81)
RDW: 16.3 % — ABNORMAL HIGH (ref 11.5–15.5)
WBC: 6.9 10*3/uL (ref 4.0–10.5)
nRBC: 0 % (ref 0.0–0.2)

## 2023-11-18 LAB — CK: Total CK: 1525 U/L — ABNORMAL HIGH (ref 49–397)

## 2023-11-18 LAB — CBG MONITORING, ED
Glucose-Capillary: 100 mg/dL — ABNORMAL HIGH (ref 70–99)
Glucose-Capillary: 110 mg/dL — ABNORMAL HIGH (ref 70–99)
Glucose-Capillary: 117 mg/dL — ABNORMAL HIGH (ref 70–99)

## 2023-11-18 LAB — TROPONIN I (HIGH SENSITIVITY)
Troponin I (High Sensitivity): 82 ng/L — ABNORMAL HIGH (ref <18)
Troponin I (High Sensitivity): 88 ng/L — ABNORMAL HIGH (ref <18)

## 2023-11-18 LAB — AMMONIA: Ammonia: 16 umol/L (ref 9–35)

## 2023-11-18 LAB — HEPATIC FUNCTION PANEL
ALT: 37 U/L (ref 0–44)
AST: 72 U/L — ABNORMAL HIGH (ref 15–41)
Albumin: 2.6 g/dL — ABNORMAL LOW (ref 3.5–5.0)
Alkaline Phosphatase: 42 U/L (ref 38–126)
Bilirubin, Direct: 0.2 mg/dL (ref 0.0–0.2)
Indirect Bilirubin: 1.2 mg/dL — ABNORMAL HIGH (ref 0.3–0.9)
Total Bilirubin: 1.4 mg/dL — ABNORMAL HIGH (ref 0.0–1.2)
Total Protein: 5.4 g/dL — ABNORMAL LOW (ref 6.5–8.1)

## 2023-11-18 LAB — ACETAMINOPHEN LEVEL: Acetaminophen (Tylenol), Serum: <10 ug/mL — ABNORMAL LOW (ref 10–30)

## 2023-11-18 LAB — RETICULOCYTES
Immature Retic Fract: 13.6 % (ref 2.3–15.9)
RBC.: 4.08 MIL/uL — ABNORMAL LOW (ref 4.22–5.81)
Retic Count, Absolute: 27.7 K/uL (ref 19.0–186.0)
Retic Ct Pct: 0.7 % (ref 0.4–3.1)

## 2023-11-18 LAB — FERRITIN: Ferritin: 360 ng/mL — ABNORMAL HIGH (ref 24–336)

## 2023-11-18 LAB — TSH: TSH: 1.269 u[IU]/mL (ref 0.350–4.500)

## 2023-11-18 LAB — VITAMIN B12: Vitamin B-12: 527 pg/mL (ref 180–914)

## 2023-11-18 LAB — HEMOGLOBIN A1C
Hgb A1c MFr Bld: 5.9 % — ABNORMAL HIGH (ref 4.8–5.6)
Mean Plasma Glucose: 122.63 mg/dL

## 2023-11-18 MED ORDER — METOPROLOL TARTRATE 25 MG PO TABS
25.0000 mg | ORAL_TABLET | Freq: Two times a day (BID) | ORAL | Status: DC
Start: 2023-11-18 — End: 2023-11-19
  Administered 2023-11-18 – 2023-11-19 (×3): 25 mg via ORAL
  Filled 2023-11-18 (×2): qty 1

## 2023-11-18 MED ORDER — HEPARIN SODIUM (PORCINE) 5000 UNIT/ML IJ SOLN
5000.0000 [IU] | Freq: Three times a day (TID) | INTRAMUSCULAR | Status: DC
  Administered 2023-11-18 – 2023-11-23 (×18): 5000 [IU] via SUBCUTANEOUS
  Filled 2023-11-18 (×17): qty 1

## 2023-11-18 MED ORDER — INSULIN ASPART 100 UNIT/ML IJ SOLN
0.0000 [IU] | Freq: Three times a day (TID) | INTRAMUSCULAR | Status: DC
  Administered 2023-11-18: 1 [IU] via SUBCUTANEOUS
  Administered 2023-11-19 – 2023-11-20 (×3): 2 [IU] via SUBCUTANEOUS
  Administered 2023-11-21 – 2023-11-22 (×4): 1 [IU] via SUBCUTANEOUS
  Administered 2023-11-22 – 2023-11-24 (×4): 2 [IU] via SUBCUTANEOUS
  Administered 2023-11-24 – 2023-11-27 (×5): 1 [IU] via SUBCUTANEOUS
  Administered 2023-11-27: 2 [IU] via SUBCUTANEOUS

## 2023-11-18 MED ORDER — BRIMONIDINE TARTRATE 0.15 % OP SOLN
1.0000 [drp] | Freq: Three times a day (TID) | OPHTHALMIC | Status: DC
  Administered 2023-11-18 – 2023-11-27 (×28): 1 [drp] via OPHTHALMIC
  Filled 2023-11-18 (×2): qty 5

## 2023-11-18 MED ORDER — TAMSULOSIN HCL 0.4 MG PO CAPS
0.4000 mg | ORAL_CAPSULE | Freq: Every day | ORAL | Status: DC
Start: 2023-11-18 — End: 2023-11-27
  Administered 2023-11-19 – 2023-11-27 (×9): 0.4 mg via ORAL
  Filled 2023-11-18 (×9): qty 1

## 2023-11-18 MED ORDER — PANTOPRAZOLE SODIUM 40 MG PO TBEC
40.0000 mg | DELAYED_RELEASE_TABLET | Freq: Every day | ORAL | Status: DC
  Administered 2023-11-19 – 2023-11-27 (×9): 40 mg via ORAL
  Filled 2023-11-18 (×9): qty 1

## 2023-11-18 MED ORDER — ASPIRIN 325 MG PO TABS
325.0000 mg | ORAL_TABLET | Freq: Every day | ORAL | Status: DC
Start: 2023-11-18 — End: 2023-11-24
  Administered 2023-11-19 – 2023-11-24 (×6): 325 mg via ORAL
  Filled 2023-11-18 (×6): qty 1

## 2023-11-18 MED ORDER — VITAMIN B-12 1000 MCG PO TABS: 1000.0000 ug | ORAL_TABLET | Freq: Every day | ORAL | Status: DC

## 2023-11-18 MED ORDER — ISOSORBIDE MONONITRATE ER 30 MG PO TB24
30.0000 mg | ORAL_TABLET | Freq: Every day | ORAL | Status: DC
Start: 2023-11-18 — End: 2023-11-27
  Administered 2023-11-19 – 2023-11-27 (×9): 30 mg via ORAL
  Filled 2023-11-18 (×9): qty 1

## 2023-11-18 MED ORDER — HYDRALAZINE HCL 20 MG/ML IJ SOLN
10.0000 mg | Freq: Four times a day (QID) | INTRAMUSCULAR | Status: DC | PRN
  Administered 2023-11-27: 10 mg via INTRAVENOUS
  Filled 2023-11-18 (×2): qty 1

## 2023-11-18 MED ORDER — AMLODIPINE BESYLATE 10 MG PO TABS
10.0000 mg | ORAL_TABLET | Freq: Every day | ORAL | Status: DC
  Administered 2023-11-19 – 2023-11-27 (×9): 10 mg via ORAL
  Filled 2023-11-18 (×9): qty 1

## 2023-11-18 MED ORDER — MEDIHONEY WOUND/BURN DRESSING EX PSTE
1.0000 | PASTE | Freq: Every day | CUTANEOUS | Status: DC
  Administered 2023-11-19 – 2023-11-27 (×6): 1 via TOPICAL
  Filled 2023-11-18 (×2): qty 44

## 2023-11-18 NOTE — Progress Notes (Signed)
 PROGRESS NOTE  Peter Mann  DOB: 1941/02/16  PCP: [name], MD FMW:[id number]  DOA: 11/17/2023  LOS: 0 days  Hospital Day: 2  Brief narrative: Peter Mann is a 83 y.o. male with PMH significant for DM2, HTN, HLD, smoking, stroke, dementia, CKD, COPD, recurrent falls, prostate cancer s/p radiation. 9/9, patient was brought to the ED after he was found on the ground. Per report, family last saw him 4 days ago and was not able to hear back from him after that.  They sent out a wellness check and EMS found the patient on the floor confused and brought to the ED.  Unknown downtime.  In the ED, patient was alert, awake, oriented x 3 Hemodynamically stable Labs with WC count 7.2, hemoglobin 12.3, CK over 2500, BUNs/creatinine 88/3.95, AST/ALT 109/51, lactic acid normal, Urinalysis unremarkable except for glucose of over 500 Pelvic x-ray did not show any bony abnormality Chest x-ray unremarkable CT head and CT spine did not show acute findings, showed generalized atrophy, chronic small vessel ischemic changes  MRI brain did not show any acute intracranial abnormality, showed chronic small vessel disease most pronounced in the right thalamus and cerebellum, moderate white matter disease, 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 Patient was started on IV hydration Admitted to [facility]  Subjective: Patient was seen and examined this morning. Elderly African-American male.  Lying down in bed.  Opens eyes on command.  Looks very dry.  Family at bedside. Chart reviewed Blood pressure this morning elevated to 160s Most recent labs from this morning showed WC count 6.9, hemoglobin 10.4, platelet 251, bicarb low at 16, BUN/creatinine 87/3.63  Assessment and plan: AKI on CKD2 Acute metabolic acidosis Baseline creatinine from March 2025 was 1.1 Presented with creatinine elevated to 3.95 and bicarb low at 14 in the setting of  rhabdomyolysis Continue IV hydration and monitoring If bicarb level does not improve, plan to switch from LR to bicarb drip.  Recent Labs    05/08/23 0024 05/08/23 1436 05/09/23 0550 11/07/23 1235 11/17/23 2033 11/17/23 2104 11/18/23 0334  BUN 18 18 17 20  88* 68* 87*  CREATININE 1.30* 1.05 1.11 1.32* 3.95* 3.30* 3.63*  CO2 23 23 23 23  14*  --  16*   Rhabdomyolysis Unknown downtime on the floor.   CK level was elevated over 2500.  Gradually improving with IV fluid Transaminases improving as well.  Statin on hold Recent Labs  Lab 11/17/23 2033 11/18/23 0334  CKTOTAL 2,573* 1,525*   Recent Labs  Lab 11/17/23 2033 11/18/23 0334  AST 109* 72*  ALT 51* 37  ALKPHOS 54 42  BILITOT 1.4* 1.4*  PROT 6.7 5.4*  ALBUMIN 3.4* 2.6*    Acute metabolic encephalopathy Underlying dementia Somewhat confused on presentation.   Brain MRI as above unremarkable for acute findings, has chronic ischemic disease  Urinalysis unremarkable for infection TSH and B12 level normal Speech this morning.  Failed swallow screen.  Cannot keep oral meds on hold. Remains somewhat agitated.  Type 2 diabetes mellitus A1c 5.9 on 11/18/2023 PTA meds-Onglyza, metformin  Currently on SSI/Accu-Cheks Recent Labs  Lab 11/18/23 0020 11/18/23 0803 11/18/23 1252  GLUCAP 110* 117* 100*   H/o stroke, HLD On aspirin   Statin on hold   Hypertension  PTA meds- metoprolol  25 mg twice daily, amlodipine  10 mg daily, Imdur  30 mg daily Blood pressure elevated.  Resume all IV hydralazine  for now.  Chronic anemia  Chronic vitamin B12 deficiency Mild chronic anemia.  Stable Resume B12 supplement when able to tolerate Recent Labs    05/08/23 1436 05/09/23 0550 11/07/23 1235 11/17/23 2033 11/17/23 2104 11/18/23 0334 11/18/23 0521  HGB 10.7* 10.8* 10.9* 12.3* 10.9* 10.4*  --   MCV 83.7 84.9 84.8 84.8  --  84.0  --   VITAMINB12 109*  --   --   --   --   --  527  FOLATE 15.3  --   --   --   --   --  5.4*   FERRITIN 64  --   --   --   --   --  360*  TIBC 316  --   --   --   --   --  230*  IRON 15*  --   --   --   --   --  17*  RETICCTPCT 1.0  --   --   --   --   --  0.7   BPH  H/o prostate cancer s/p radiation on Flomax   Generalized weakness Recurrent falls Impaired mobility PT eval pending  Chronic smoking H/o COPD Counseled to quit.  Nicotine patch offered    Mobility:  PT Orders: Active   PT Follow up Rec: Skilled Nursing-Short Term Rehab (<3 Hours/Day)11/18/2023 1100   Goals of care   Code Status: Full Code     DVT prophylaxis:  heparin  injection 5,000 Units Start: 11/18/23 0600   Antimicrobials: None Fluid: LR at 100 mL/h Consultants: None Family Communication: None at bedside  Status: Inpatient Level of care:  Telemetry Medical   Patient is from: Home Needs to continue in-hospital care: Needs IV fluid, inpatient monitoring Anticipated d/c to: Pending clinical course    Diet:  Diet Order             Diet heart healthy/carb modified Room service appropriate? Yes; Fluid consistency: Thin  Diet effective now                   Scheduled Meds:  amLODipine   10 mg Oral Daily   aspirin   325 mg Oral Daily   brimonidine   1 drop Both Eyes TID   heparin   5,000 Units Subcutaneous Q8H   insulin  aspart  0-9 Units Subcutaneous TID WC   isosorbide  mononitrate  30 mg Oral Daily   leptospermum manuka honey  1 Application Topical Daily   metoprolol  tartrate  25 mg Oral BID   pantoprazole   40 mg Oral Daily   tamsulosin   0.4 mg Oral Daily    PRN meds: hydrALAZINE    Infusions:   lactated ringers  100 mL/hr at 11/18/23 0017    Antimicrobials: Anti-infectives (From admission, onward)    None       Objective: Vitals:   11/18/23 1047 11/18/23 1100  BP:  (!) 159/85  Pulse:  98  Resp:  14  Temp: 98.3 F (36.8 C)   SpO2:  100%    Intake/Output Summary (Last 24 hours) at 11/18/2023 1414 Last data filed at 11/17/2023 2354 Gross per 24 hour   Intake 2000 ml  Output --  Net 2000 ml   Filed Weights   11/17/23 2052  Weight: 71 kg   Weight change:  Body mass index is 24.52 kg/m.   Physical Exam: General exam: Pleasant, elderly African-American male Skin: No rashes, lesions or ulcers.  Looks very dry HEENT: Atraumatic, normocephalic, no obvious bleeding Lungs: Clear to auscultation bilaterally,  CVS: S1, S2, no murmur,   GI/Abd: Soft, nontender, nondistended, bowel  sound present,   CNS: Alert, opening eyes on verbal command Psychiatry: Mood appropriate Extremities: No pedal edema, no calf tenderness,   Data Review: I have personally reviewed the laboratory data and studies available.  F/u labs ordered Unresulted Labs (From admission, onward)     Start     Ordered   11/19/23 0500  Basic metabolic panel with GFR  Daily,   R      11/18/23 1414   11/19/23 0500  CBC with Differential/Platelet  Daily,   R      11/18/23 1414   11/18/23 0250  CBC  (heparin )  Once,   R       Comments: Baseline for heparin  therapy IF NOT ALREADY DRAWN.  Notify MD if PLT < 100 K.    11/18/23 0251            Signed, Chapman Rota, MD [facility] 11/18/2023

## 2023-11-18 NOTE — ED Notes (Signed)
 Patient transported to MRI

## 2023-11-18 NOTE — ED Notes (Signed)
 Pt has removed his IV and has pulled off all of the monitoring devices. Awaiting mittens to be sent from supply.

## 2023-11-18 NOTE — Evaluation (Signed)
 Physical Therapy Evaluation Patient Details Name: Peter Mann MRN: [id number] DOB: 1941-02-27 Today's Date: 11/18/2023  History of Present Illness  The pt is an 83 yo male presenting 9/9 after being found on ground by family. Admitted for management of rhabdomyolysis and possible UTI. PMH includes: HTN, HLD, CVA, DM II, tobacco use, BPH, and dementia.  Clinical Impression  Pt in bed upon arrival of PT, agreeable to evaluation at this time. Prior to admission the pt reports he was ambulating without DME, living alone, and independent with ADLs other than driving. He reports family drives for groceries and appointments, and that he falls often at home. The pt was oriented to self, year, [place] only, was able to accurately state September for month when given choices. Otherwise, pt not oriented to situation, day, or time currently and no family present to confirm PLOF information. Pt able to follow commands within session with all extremities, reports pain in back and R hip with mobility and generally needed modA to complete bed mobility and sit-stand transfer from ED stretcher. Further mobility deferred due to fatigue and reports of weakness with stance, but no overt buckling noted in session. Will benefit from further acute PT, as well as evaluation by OT. Anticipate pt will need further post-acute therapies <3hours/day prior to return home.      If plan is discharge home, recommend the following: Two people to help with walking and/or transfers;A lot of help with bathing/dressing/bathroom;Assistance with cooking/housework;Direct supervision/assist for medications management;Direct supervision/assist for financial management;Assistance with feeding;Assist for transportation;Help with stairs or ramp for entrance;Supervision due to cognitive status   Can travel by private vehicle   No    Equipment Recommendations Rolling walker (2 wheels);Wheelchair (measurements PT);Wheelchair cushion  (measurements PT)  Recommendations for Other Services  OT consult    Functional Status Assessment Patient has had a recent decline in their functional status and demonstrates the ability to make significant improvements in function in a reasonable and predictable amount of time.     Precautions / Restrictions Precautions Precautions: Fall Recall of Precautions/Restrictions: Impaired Restrictions Weight Bearing Restrictions Per Provider Order: No      Mobility  Bed Mobility Overal bed mobility: Needs Assistance Bed Mobility: Supine to Sit, Sit to Supine     Supine to sit: Mod assist, HOB elevated Sit to supine: Max assist   General bed mobility comments: modA to manage LE and elevate trunk, pt returning to supine without support, then modA to rise again    Transfers Overall transfer level: Needs assistance Equipment used: 1 person hand held assist Transfers: Sit to/from Stand Sit to Stand: Mod assist, From elevated surface           General transfer comment: modA to rise from elevated ED stretcher, but no overt buckling. completed x2. increased assist for sitting to scoot backwards onto stretcher for safer seat    Ambulation/Gait               General Gait Details: unable, modA to maintain static stance adn pt fatigued quickly  Stairs            Wheelchair Mobility     Tilt Bed    Modified Rankin (Stroke Patients Only)       Balance Overall balance assessment: Needs assistance Sitting-balance support: Bilateral upper extremity supported, Feet supported Sitting balance-Leahy Scale: Poor Sitting balance - Comments: posterior LOB without support Postural control: Posterior lean Standing balance support: Bilateral upper extremity supported, During functional activity Standing balance-Leahy Scale: Poor  Standing balance comment: dependent on BUE support and modA to maintain static stance                             Pertinent  Vitals/Pain Pain Assessment Pain Assessment: Faces Faces Pain Scale: Hurts even more Pain Location: back with movement Pain Descriptors / Indicators: Grimacing, Discomfort Pain Intervention(s): Limited activity within patient's tolerance, Monitored during session, Repositioned    Home Living Family/patient expects to be discharged to:: Private residence Living Arrangements: Alone Available Help at Discharge: Friend(s);Family;Available PRN/intermittently Type of Home: Apartment Home Access: Level entry       Home Layout: One level Home Equipment: Cane - single Information systems manager (2 wheels);Rollator (4 wheels);Wheelchair - manual;Shower seat Additional Comments: pt questionable historian, but consistent in his report today    Prior Function Prior Level of Function : Patient poor historian/Family not available;History of Falls (last six months);Independent/Modified Independent             Mobility Comments: reports no use of DME but frequent falls. n ADLs Comments: pt reports independence other than driving     Extremity/Trunk Assessment   Upper Extremity Assessment Upper Extremity Assessment: Defer to OT evaluation;Generalized weakness (able to follow cues and use functionally within session)    Lower Extremity Assessment Lower Extremity Assessment: Generalized weakness (partial ROM due to pain, eepscially in R hip.)    Cervical / Trunk Assessment Cervical / Trunk Assessment: Kyphotic  Communication   Communication Communication: Impaired Factors Affecting Communication: Difficulty expressing self;Hearing impaired    Cognition Arousal: Alert Behavior During Therapy: WFL for tasks assessed/performed   PT - Cognitive impairments: Orientation, Difficult to assess, Awareness, Problem solving, Safety/Judgement Difficult to assess due to: Impaired communication (garbled speech at times) Orientation impairments: Place, Time, Situation                    PT - Cognition Comments: pt unaware that he fell but reports not surprising as he falls a bunch, thought he was at home and I was [facility] aide, thought today was [place] (it is [place]) but knew year and month when given choices. Following commands: Impaired Following commands impaired: Follows one step commands inconsistently     Cueing Cueing Techniques: Verbal cues     General Comments General comments (skin integrity, edema, etc.): VSS on RA    Exercises     Assessment/Plan    PT Assessment Patient needs continued PT services  PT Problem List Decreased strength;Decreased range of motion;Decreased activity tolerance;Decreased balance;Decreased mobility;Decreased coordination;Decreased cognition;Decreased safety awareness;Decreased knowledge of precautions;Pain       PT Treatment Interventions DME instruction;Stair training;Gait training;Functional mobility training;Therapeutic activities;Therapeutic exercise;Balance training;Neuromuscular re-education;Cognitive remediation;Patient/family education    PT Goals (Current goals can be found in the Care Plan section)  Acute Rehab PT Goals Patient Stated Goal: return home PT Goal Formulation: With patient Time For Goal Achievement: 12/02/23 Potential to Achieve Goals: Fair    Frequency Min 2X/week     Co-evaluation               AM-PAC PT 6 Clicks Mobility  Outcome Measure Help needed turning from your back to your side while in a flat bed without using bedrails?: A Lot Help needed moving from lying on your back to sitting on the side of a flat bed without using bedrails?: A Lot Help needed moving to and from a bed to a chair (including a wheelchair)?: Total Help needed standing  up from a chair using your arms (e.g., wheelchair or bedside chair)?: A Lot Help needed to walk in hospital room?: Total Help needed climbing 3-5 steps with a railing? : Total 6 Click Score: 9    End of Session Equipment Utilized During  Treatment: Gait belt Activity Tolerance: Patient limited by pain;Patient limited by fatigue Patient left: in bed;with call bell/phone within reach;with bed alarm set;Other (comment) (bilateral mitts on) Nurse Communication: Mobility status (pt asking for water ) PT Visit Diagnosis: Unsteadiness on feet (R26.81);Other abnormalities of gait and mobility (R26.89);Repeated falls (R29.6);Muscle weakness (generalized) (M62.81);History of falling (Z91.81);Pain Pain - Right/Left: Right Pain - part of body: Hip (and back)    Time: 8950-8883 PT Time Calculation (min) (ACUTE ONLY): 27 min   Charges:   PT Evaluation $PT Eval Moderate Complexity: 1 Mod PT Treatments $Therapeutic Activity: 8-22 mins PT General Charges $$ ACUTE PT VISIT: 1 Visit         Izetta Call, PT, DPT   Acute Rehabilitation Department Office [phone] Secure Chat Communication Preferred  Izetta JULIANNA Call 11/18/2023, 11:51 AM

## 2023-11-18 NOTE — Consult Note (Signed)
 WOC Nurse Consult Note: Reason for Consult: Multiple wounds, patient was found down; rhabdomyolysis, ACD, DM, encephalopathy Wound type: Right Hip Unstageable PI POA Pressure Injury POA: Yes/No/NA Measurement:5 x 6 Wound bed: dehydrated appearance, non-viable tissue Drainage (amount, consistency, odor) none Periwound:induration, darker edge, scar tissue Dressing procedure/placement/frequency: Right Hip cleanse with normal saline, pat dry and apply Medihoney gel daily, cover with 4 x 4 Mepilex foam dressing.  Wound type: Right lateral leg near knee, possible trauma wound Measurement: 3 x 3 Wound bed: dry non-viable tissue Drainage (amount, consistency, odor) none Periwound: scar tissue Dressing procedure/placement/frequency: Right Lateral Wound cleanse with normal saline, pat dry, apply 4x4 Mepilex foam dressing, assess every shift, change foam dressing every 3 days  Wound type: Buttock; Coccyx PI Chronic Stage 2 POA There is a possibility that this patients wound began as a DTPI given the surrounding darker skin tone Measurement: 3.5 x 2 Wound bed: partial-skin loss Drainage (amount, consistency, odor)  Periwound: hypopigmentation Dressing procedure/placement/frequency:  Coccyx Wound cleanse wound with normal saline, pat dry and apply 2 layers of Xeroform yellow guaze over wound, cover with 4x4 Mepilex foam dressing, change out foam dressing every 3 days. BID  Wound type: Right Back Wound, DTPI  Measurement: 5 x 7 Wound bed: partial skin loss, outlying darker discoloration, due to patients skin pigmentation difficult to determine if erythema is present, perhaps had epidermal sloughing prior to hospitalization, there could also be a friction component  Drainage (amount, consistency, odor) dry,no moisture  Periwound: discolorations  Dressing procedure/placement/frequency: Right Back cleanse with normal saline, pat dry with gauze, and apply 4 x 4 Mepilex foam dressing, assess every  shift, change every 3 days.  Wound type: Left Great Toe; Medial aspect chronic wound Possible DTPI POA with prior epidermal sloughing Measurement: 1 x 1.5 x .25 Wound bed: leathery; dry  Drainage (amount, consistency, odor) none Periwound: induration, some maroon rim discoloration Dressing procedure/placement/frequency:  Left great toe; open site with skin loss over metatarsal bone. Cleanse with Sage wipes daily, apply socks at all times, off-load heels onto pillows.   Patient stated he fell and was on the floor for 1 day. All wounds appear to be recalcitrant. Patient could not recall when these wounds occurred, but did state that buttock wound has been present for many months.    Right Hip 11/18/23  Right back  Buttock  Right Lateral Leg    WOC will not follow and will remove patient from census task list. Please reconsult if wound worsens in condition and notify provider.   Sherrilyn Hals MSN RN CWOCN WOC [facility]  [phone] (Available from 7-3 pm Mon-Friday)

## 2023-11-18 NOTE — ED Notes (Signed)
 Patient NPO. This nurse removed lunch tray at this time. Pt needs a swallow and speech consult.

## 2023-11-18 NOTE — ED Notes (Signed)
 PT cleaned of urine, bed changed. PT has an unstageable wound to the right hip, wound to the upper right back, wound to outer right shin, and wound to left bunion area.

## 2023-11-18 NOTE — Progress Notes (Signed)
 Physical Therapy Treatment Patient Details Name: Peter Mann MRN: [id number] DOB: [birth year] Today's Date: 11/18/2023   History of Present Illness The pt is an 83 yo male presenting 9/9 after being found on ground by family. Admitted for management of rhabdomyolysis and possible UTI. PMH includes: HTN, HLD, CVA, DM II, tobacco use, BPH, and dementia.    PT Comments  Returned to room to assist with bed mobility for full wound assessment with wound care RN. Pt needing increased cues and assist to facilitate all rolling and pulling to long-sitting in bed for wound assessment. Continues to report pain in back with mobility, resolves with cessation of mobility. Pt also asking for water , he initially took a large drink of water  with straw and pt with onset of gurgling and coughing. Pt continued to ask for water  and was encouraged to take small sip with focus on swallow, but continued to have gurgling and coughing, RN alerted and recommend SLP swallow evaluation. Recommendations remain appropriate.    If plan is discharge home, recommend the following: Two people to help with walking and/or transfers;A lot of help with bathing/dressing/bathroom;Assistance with cooking/housework;Direct supervision/assist for medications management;Direct supervision/assist for financial management;Assistance with feeding;Assist for transportation;Help with stairs or ramp for entrance;Supervision due to cognitive status   Can travel by private vehicle     No  Equipment Recommendations  Rolling walker (2 wheels);Wheelchair (measurements PT);Wheelchair cushion (measurements PT)    Recommendations for Other Services OT consult;Speech consult     Precautions / Restrictions Precautions Precautions: Fall Recall of Precautions/Restrictions: Impaired Restrictions Weight Bearing Restrictions Per Provider Order: No     Mobility  Bed Mobility Overal bed mobility: Needs Assistance Bed Mobility: Rolling, Supine to  Sit Rolling: Used rails, Max assist   Supine to sit: Mod assist, HOB elevated Sit to supine: Max assist   General bed mobility comments: modA to pull forwards to long sitting, maxA to complete rolling fully for wound assessment    Transfers        General transfer comment: session focused on bed mobility for wound assessment       Balance Overall balance assessment: Needs assistance Sitting-balance support: Bilateral upper extremity supported, Feet supported Sitting balance-Leahy Scale: Poor Sitting balance - Comments: posterior LOB without support Postural control: Posterior lean Standing balance support: Bilateral upper extremity supported, During functional activity Standing balance-Leahy Scale: Poor Standing balance comment: dependent on BUE support and modA to maintain static stance                            Communication Communication Communication: Impaired Factors Affecting Communication: Difficulty expressing self;Hearing impaired  Cognition Arousal: Alert Behavior During Therapy: WFL for tasks assessed/performed   PT - Cognitive impairments: Orientation, Difficult to assess, Awareness, Problem solving, Safety/Judgement Difficult to assess due to: Impaired communication (garbled speech at times) Orientation impairments: Place, Time, Situation                   PT - Cognition Comments: Pt able to recall what day it was from when I was last in his room, still needing increased time and general cues to not pull at lines Following commands: Impaired Following commands impaired: Follows one step commands inconsistently    Cueing Cueing Techniques: Verbal cues  Exercises      General Comments General comments (skin integrity, edema, etc.): VSS on RA, wound care RN present to document wounds. pt with coughing and gurgling after attempt at sip  of water  with straw, RN alerted and SLP recommended      Pertinent Vitals/Pain Pain Assessment Pain  Assessment: Faces Faces Pain Scale: Hurts even more Pain Location: back with movement Pain Descriptors / Indicators: Grimacing, Discomfort Pain Intervention(s): Limited activity within patient's tolerance, Monitored during session, Repositioned    Home Living Family/patient expects to be discharged to:: Private residence Living Arrangements: Alone Available Help at Discharge: Friend(s);Family;Available PRN/intermittently Type of Home: Apartment Home Access: Level entry       Home Layout: One level Home Equipment: Cane - single Information systems manager (2 wheels);Rollator (4 wheels);Wheelchair - Lawyer Comments: pt questionable historian, but consistent in his report today        PT Goals (current goals can now be found in the care plan section) Acute Rehab PT Goals Patient Stated Goal: return home PT Goal Formulation: With patient Time For Goal Achievement: 12/02/23 Potential to Achieve Goals: Fair Progress towards PT goals: Progressing toward goals    Frequency    Min 2X/week       AM-PAC PT 6 Clicks Mobility   Outcome Measure  Help needed turning from your back to your side while in a flat bed without using bedrails?: A Lot Help needed moving from lying on your back to sitting on the side of a flat bed without using bedrails?: A Lot Help needed moving to and from a bed to a chair (including a wheelchair)?: Total Help needed standing up from a chair using your arms (e.g., wheelchair or bedside chair)?: A Lot Help needed to walk in hospital room?: Total Help needed climbing 3-5 steps with a railing? : Total 6 Click Score: 9    End of Session Equipment Utilized During Treatment: Gait belt Activity Tolerance: Patient limited by pain;Patient limited by fatigue Patient left: in bed;with call bell/phone within reach;with bed alarm set;Other (comment) (bilateral mitts on) Nurse Communication: Mobility status (pt asking for water ) PT Visit  Diagnosis: Unsteadiness on feet (R26.81);Other abnormalities of gait and mobility (R26.89);Repeated falls (R29.6);Muscle weakness (generalized) (M62.81);History of falling (Z91.81);Pain Pain - Right/Left: Right Pain - part of body: Hip (and back)     Time: 8881-8871 PT Time Calculation (min) (ACUTE ONLY): 10 min  Charges:    $Therapeutic Activity: 8-22 mins PT General Charges $$ ACUTE PT VISIT: 1 Visit                     Izetta Call, PT, DPT   Acute Rehabilitation Department Office [phone] Secure Chat Communication Preferred   Izetta JULIANNA Call 11/18/2023, 12:03 PM

## 2023-11-18 NOTE — Evaluation (Signed)
 Clinical/Bedside Swallow Evaluation Patient Details  Name: Peter Mann MRN: [id number] Date of Birth: 04-26-1940  Today's Date: 11/18/2023 Time: SLP Start Time (ACUTE ONLY): 1428 SLP Stop Time (ACUTE ONLY): 1440 SLP Time Calculation (min) (ACUTE ONLY): 12 min  Past Medical History:  Past Medical History:  Diagnosis Date   At low risk for fall    Cancer (HCC)    prostrate ca, radiation treatments   Chest pain    COPD (chronic obstructive pulmonary disease) (HCC)    Dementia (HCC)    Diabetes mellitus without complication (HCC)    Dizziness    History of tobacco abuse    Hyperlipidemia    Hypertension    Stroke [facility])    Subconjunctival hemorrhage of left eye 07/18/2019   The condition of the involved eye is that of a subconjunctival hemorrhage.  These are often spontaneous but are also often at or near the site of low location of an injection should to be placed into the eye.  In the absence of direct blunt or severe trauma these will typically resolve on their own spontaneously and have no impact on the vision.    These are very similar to having a bruise in your   Past Surgical History:  Past Surgical History:  Procedure Laterality Date   CHOLECYSTECTOMY N/A 01/30/2014   Procedure: LAPAROSCOPIC CHOLECYSTECTOMY;  Surgeon: [name], MD;  Location: [facility] OR;  Service: General;  Laterality: N/A;   HPI:  The pt is an 83 yo male presenting 9/9 after being found on ground by family. Admitted for management of rhabdomyolysis and possible UTI. PMH includes: HTN, HLD, CVA, DM II, tobacco use, BPH, and dementia.    Assessment / Plan / Recommendation  Clinical Impression  Pt is alert and oriented but is very impulsive with PO intake. No signs clinically concerning for aspiration were observed with sips of thin liquids, including during the 3 oz water  test. Mastication is mildly prolonged with solids and oral residue is noted along the lateral sulci and lingual surface. Recommend  resuming diet but with Dys 3 solids and thin liquids. Full supervision is encouraged to monitor for oral clearance and assist with pacing. SLP will f/u at least briefly. SLP Visit Diagnosis: Dysphagia, unspecified (R13.10)    Aspiration Risk  Mild aspiration risk    Diet Recommendation Dysphagia 3 (Mech soft);Thin liquid    Liquid Administration via: Cup;Straw Medication Administration: Whole meds with liquid Supervision: Staff to assist with self feeding;Full supervision/cueing for compensatory strategies Compensations: Minimize environmental distractions;Slow rate;Small sips/bites;Lingual sweep for clearance of pocketing Postural Changes: Seated upright at 90 degrees    Other  Recommendations Oral Care Recommendations: Oral care BID     Assistance Recommended at Discharge    Functional Status Assessment Patient has had a recent decline in their functional status and demonstrates the ability to make significant improvements in function in a reasonable and predictable amount of time.  Frequency and Duration min 2x/week  1 week       Prognosis Prognosis for improved oropharyngeal function: Good Barriers to Reach Goals: Cognitive deficits      Swallow Study   General HPI: The pt is an 83 yo male presenting 9/9 after being found on ground by family. Admitted for management of rhabdomyolysis and possible UTI. PMH includes: HTN, HLD, CVA, DM II, tobacco use, BPH, and dementia. Type of Study: Bedside Swallow Evaluation Previous Swallow Assessment: none in chart Diet Prior to this Study: NPO Temperature Spikes Noted: No Respiratory Status: Room  air History of Recent Intubation: No Behavior/Cognition: Alert;Cooperative;Requires cueing Oral Cavity Assessment: Dried secretions Oral Care Completed by SLP: No Oral Cavity - Dentition: Adequate natural dentition Vision: Functional for self-feeding Self-Feeding Abilities: Able to feed self Patient Positioning: Upright in bed Baseline  Vocal Quality: Normal Volitional Cough: Strong Volitional Swallow: Able to elicit    Oral/Motor/Sensory Function Overall Oral Motor/Sensory Function: Within functional limits   Ice Chips Ice chips: Not tested   Thin Liquid Thin Liquid: Within functional limits Presentation: Straw    Nectar Thick Nectar Thick Liquid: Not tested   Honey Thick Honey Thick Liquid: Not tested   Puree Puree: Within functional limits Presentation: Spoon   Solid     Solid: Impaired Oral Phase Functional Implications: Prolonged oral transit;Oral residue      Damien Blumenthal, M.A., CCC-SLP Speech Language Pathology, Acute Rehabilitation Services  Secure Chat preferred [phone]  11/18/2023,2:45 PM

## 2023-11-19 ENCOUNTER — Inpatient Hospital Stay (HOSPITAL_COMMUNITY)

## 2023-11-19 DIAGNOSIS — N179 Acute kidney failure, unspecified: Secondary | ICD-10-CM | POA: Diagnosis not present

## 2023-11-19 LAB — CBC WITH DIFFERENTIAL/PLATELET
Abs Immature Granulocytes: 0.02 K/uL (ref 0.00–0.07)
Basophils Absolute: 0 K/uL (ref 0.0–0.1)
Basophils Relative: 0 %
Eosinophils Absolute: 0.1 K/uL (ref 0.0–0.5)
Eosinophils Relative: 2 %
HCT: 33.6 % — ABNORMAL LOW (ref 39.0–52.0)
Hemoglobin: 10.7 g/dL — ABNORMAL LOW (ref 13.0–17.0)
Immature Granulocytes: 0 %
Lymphocytes Relative: 9 %
Lymphs Abs: 0.5 K/uL — ABNORMAL LOW (ref 0.7–4.0)
MCH: 26.4 pg (ref 26.0–34.0)
MCHC: 31.8 g/dL (ref 30.0–36.0)
MCV: 83 fL (ref 80.0–100.0)
Monocytes Absolute: 0.8 K/uL (ref 0.1–1.0)
Monocytes Relative: 13 %
Neutro Abs: 4.4 K/uL (ref 1.7–7.7)
Neutrophils Relative %: 76 %
Platelets: 225 K/uL (ref 150–400)
RBC: 4.05 MIL/uL — ABNORMAL LOW (ref 4.22–5.81)
RDW: 16.5 % — ABNORMAL HIGH (ref 11.5–15.5)
WBC: 5.8 K/uL (ref 4.0–10.5)
nRBC: 0 % (ref 0.0–0.2)

## 2023-11-19 LAB — BASIC METABOLIC PANEL WITH GFR
Anion gap: 13 (ref 5–15)
BUN: 79 mg/dL — ABNORMAL HIGH (ref 8–23)
CO2: 18 mmol/L — ABNORMAL LOW (ref 22–32)
Calcium: 9 mg/dL (ref 8.9–10.3)
Chloride: 114 mmol/L — ABNORMAL HIGH (ref 98–111)
Creatinine, Ser: 2.92 mg/dL — ABNORMAL HIGH (ref 0.61–1.24)
GFR, Estimated: 21 mL/min — ABNORMAL LOW (ref >60)
Glucose, Bld: 86 mg/dL (ref 70–99)
Potassium: 3.8 mmol/L (ref 3.5–5.1)
Sodium: 145 mmol/L (ref 135–145)

## 2023-11-19 LAB — GLUCOSE, CAPILLARY
Glucose-Capillary: 81 mg/dL (ref 70–99)
Glucose-Capillary: 183 mg/dL — ABNORMAL HIGH (ref 70–99)
Glucose-Capillary: 179 mg/dL — ABNORMAL HIGH (ref 70–99)
Glucose-Capillary: 163 mg/dL — ABNORMAL HIGH (ref 70–99)

## 2023-11-19 MED ORDER — FERROUS SULFATE 325 (65 FE) MG PO TABS
325.0000 mg | ORAL_TABLET | Freq: Two times a day (BID) | ORAL | Status: DC
  Administered 2023-11-19 – 2023-11-27 (×16): 325 mg via ORAL
  Filled 2023-11-19 (×16): qty 1

## 2023-11-19 MED ORDER — LACTATED RINGERS IV SOLN: INTRAVENOUS | Status: DC

## 2023-11-19 MED ORDER — FOLIC ACID 1 MG PO TABS
1.0000 mg | ORAL_TABLET | Freq: Every day | ORAL | Status: DC
Start: 2023-11-19 — End: 2023-11-27
  Administered 2023-11-19 – 2023-11-27 (×9): 1 mg via ORAL
  Filled 2023-11-19 (×9): qty 1

## 2023-11-19 MED ORDER — CARVEDILOL 6.25 MG PO TABS
6.2500 mg | ORAL_TABLET | Freq: Two times a day (BID) | ORAL | Status: DC
  Administered 2023-11-19 – 2023-11-27 (×16): 6.25 mg via ORAL
  Filled 2023-11-19 (×17): qty 1

## 2023-11-19 MED ORDER — POLYETHYLENE GLYCOL 3350 17 G PO PACK
17.0000 g | PACK | Freq: Two times a day (BID) | ORAL | Status: DC
  Administered 2023-11-19 – 2023-11-27 (×11): 17 g via ORAL
  Filled 2023-11-19 (×12): qty 1

## 2023-11-19 MED ORDER — DOCUSATE SODIUM 100 MG PO CAPS
200.0000 mg | ORAL_CAPSULE | Freq: Two times a day (BID) | ORAL | Status: DC
  Administered 2023-11-19 – 2023-11-27 (×11): 200 mg via ORAL
  Filled 2023-11-19 (×12): qty 2

## 2023-11-19 NOTE — Progress Notes (Signed)
 OT Cancellation Note  Patient Details Name: Peter Mann MRN: [id number] DOB: Mar 09, 1941   Cancelled Treatment:    Reason Eval/Treat Not Completed: Other (comment) (Pt in process of returning to room from procedure with RN assisting pt and with pt having not yet had lunch. Per RN request, OT to reattempt to see pt at a later time as appropriate/available.)  Peter Rockey HERO., OTR/L, MA Acute Rehab [phone]   Peter Mann 11/19/2023, 1:06 PM

## 2023-11-19 NOTE — [place] FL2 (Signed)
 [place]  MEDICAID FL2 LEVEL OF CARE FORM     IDENTIFICATION  Patient Name: Peter Mann Birthdate: 1940-09-14 Sex: male Admission Date (Current Location): 11/17/2023  [facility] and [place] Number:  Producer, television/film/video and Address:  [facility], [address], [place], [place] [address]      Provider Number: [id number]  Attending Physician Name and Address:  Dennise Lavada POUR, MD  Relative Name and Phone Number:       Current Level of Care: Hospital Recommended Level of Care: Skilled Nursing Facility Prior Approval Number:    Date Approved/Denied: 11/19/23 PASRR Number: [id number]  Discharge Plan: SNF    Current Diagnoses: Patient Active Problem List   Diagnosis Date Noted   Rhabdomyolysis 11/18/2023   Elevated LFTs 11/18/2023   Acute renal failure (ARF) (HCC) 11/18/2023   ARF (acute renal failure) (HCC) 11/17/2023   Weakness 05/08/2023   History of stroke 05/08/2023   Anemia 05/08/2023   Thrombocytopenia (HCC) 05/08/2023   Fall at home, initial encounter 05/08/2023   Influenza A 05/08/2023   Constipation 05/08/2023   BPH (benign prostatic hyperplasia) 05/08/2023   Pseudophakia of left eye 05/22/2021   Nuclear sclerotic cataract of right eye 02/15/2020   Exudative age-related macular degeneration of left eye with active choroidal neovascularization (HCC) 07/11/2019   Serous detachment of retinal pigment epithelium of left eye 07/11/2019   Posterior vitreous detachment of right eye 07/11/2019   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 07/11/2019   Posterior vitreous detachment of left eye 07/11/2019   Smoker 03/17/2016   Facial palsy    Acute CVA (cerebrovascular accident) (HCC)    HLD (hyperlipidemia)    Diabetes mellitus type 2, noninsulin dependent (HCC)    CVA (cerebral vascular accident) (HCC) 08/23/2015   Abdominal distention    Gangrenous cholecystitis    Ileus (HCC)    Abdominal pain 01/28/2014    Cholelithiasis 01/28/2014   Cholecystitis 01/28/2014   Symptomatic cholelithiasis 01/28/2014   Hyperglycemia 01/28/2014   Chest pain 02/01/2013   Essential hypertension 02/01/2013   Hyperlipidemia 02/01/2013    Orientation RESPIRATION BLADDER Height & Weight     Self, Time, Situation, Place  Normal Incontinent Weight: 156 lb 8.4 oz (71 kg) Height:  5' 7 (170.2 cm)  BEHAVIORAL SYMPTOMS/MOOD NEUROLOGICAL BOWEL NUTRITION STATUS      Continent Diet (See dc summary)  AMBULATORY STATUS COMMUNICATION OF NEEDS Skin   Limited Assist Verbally PU Stage and Appropriate Care (Pressure injusry on left foot, wound on right knee, pressure injusry right upper back, pressure injury right hip)                       Personal Care Assistance Level of Assistance  Bathing, Feeding, Dressing Bathing Assistance: Limited assistance Feeding assistance: Limited assistance Dressing Assistance: Limited assistance     Functional Limitations Info             SPECIAL CARE FACTORS FREQUENCY  PT (By licensed PT), OT (By licensed OT)     PT Frequency: 5x/week OT Frequency: 5x/week            Contractures Contractures Info: Not present    Additional Factors Info  Code Status, Allergies Code Status Info: Full Allergies Info: NKA           Current Medications (11/19/2023):  This is the current hospital active medication list Current Facility-Administered Medications  Medication Dose Route Frequency Provider Last Rate Last Admin  amLODipine  (NORVASC ) tablet 10 mg  10 mg Oral Daily Franky Redia SAILOR, MD   10 mg at 11/19/23 9081   aspirin  tablet 325 mg  325 mg Oral Daily Franky Redia SAILOR, MD   325 mg at 11/19/23 9081   brimonidine  (ALPHAGAN ) 0.15 % ophthalmic solution 1 drop  1 drop Both Eyes TID Franky Redia SAILOR, MD   1 drop at 11/19/23 9078   carvedilol  (COREG ) tablet 6.25 mg  6.25 mg Oral BID WC Singh, Prashant K, MD   6.25 mg at 11/19/23 1131   ferrous sulfate  tablet 325  mg  325 mg Oral BID WC Singh, Prashant K, MD       folic acid  (FOLVITE ) tablet 1 mg  1 mg Oral Daily Singh, Prashant K, MD   1 mg at 11/19/23 1131   heparin  injection 5,000 Units  5,000 Units Subcutaneous Q8H Kakrakandy, Arshad N, MD   5,000 Units at 11/19/23 9366   hydrALAZINE  (APRESOLINE ) injection 10 mg  10 mg Intravenous Q6H PRN Dahal, Chapman, MD       insulin  aspart (novoLOG ) injection 0-9 Units  0-9 Units Subcutaneous TID WC Kakrakandy, Arshad N, MD   2 Units at 11/19/23 1147   isosorbide  mononitrate (IMDUR ) 24 hr tablet 30 mg  30 mg Oral Daily Kakrakandy, Arshad N, MD   30 mg at 11/19/23 0919   lactated ringers  infusion   Intravenous Continuous Singh, Prashant K, MD 75 mL/hr at 11/19/23 1137 New Bag at 11/19/23 1137   leptospermum manuka honey (MEDIHONEY) paste 1 Application  1 Application Topical Daily Dahal, Chapman, MD   1 Application at 11/19/23 0920   pantoprazole  (PROTONIX ) EC tablet 40 mg  40 mg Oral Daily Kakrakandy, Arshad N, MD   40 mg at 11/19/23 9081   tamsulosin  (FLOMAX ) capsule 0.4 mg  0.4 mg Oral Daily Franky Redia SAILOR, MD   0.4 mg at 11/19/23 9081     Discharge Medications: Please see discharge summary for a list of discharge medications.  Relevant Imaging Results:  Relevant Lab Results:   Additional Information SN: [id number] [place], [place]

## 2023-11-19 NOTE — Plan of Care (Signed)

## 2023-11-19 NOTE — Progress Notes (Signed)
 PROGRESS NOTE                                                                                                                                                                                                             Patient Demographics:    Peter Mann, is a 83 y.o. male, DOB - 1940-05-22, FMW:[id number]  Outpatient Primary MD for the patient is [name], MD    LOS - 1  Admit date - 11/17/2023    Chief Complaint  Patient presents with   Fall       Brief Narrative (HPI from H&P)   83 y.o. male with PMH significant for DM2, HTN, HLD, smoking, stroke, dementia, CKD, COPD, recurrent falls, prostate cancer s/p radiation. 9/9, patient was brought to the ED after he was found on the ground.\  Per report, family last saw him 4 days ago and was not able to hear back from him after that.  They sent out a wellness check and EMS found the patient on the floor confused and brought to the ED. in the ER he was diagnosed with dehydration, rhabdomyolysis, AKI and admitted.   Subjective:    Vivienne Mulder today has, No headache, No chest pain, No abdominal pain - No Nausea, No new weakness tingling or numbness, no shortness of breath   Assessment  & Plan :    AKI on CKD 2.  Baseline creatinine around 1.3, acute metabolic acidosis, fall related rhabdomyolysis. He had multiple falls in the past several months according to the daughter, MRI brain nonacute, MRI C-spine nonacute.  Continue to hydrate with IV fluids, AKI improving, PT OT, may require speech.  History of C3-C4 spinal stenosis.  PT OT and outpatient follow-up with neurology and neurosurgery.  Acute on chronic mid-low back pain.  Multiple falls recently.  Check CT T and L-spine.  Supportive care.  Acute metabolic encephalopathy with underlying dementia.  MRI brain nonacute, minimize narcotics.  PT OT speech.  As needed Haldol.  Hypertension.  On combination of Norvasc ,  beta-blocker and IV hydralazine  as needed.  Monitor and adjust.  Anemia of chronic disease, chronic vitamin B12 deficiency.  Placed on oral iron and B12 supplementation PCP to monitor.  History of stroke.  Continue on aspirin  and statin for secondary prevention.  DM type II.  SSI.  CBG (last 3)  Recent Labs    11/18/23 1544 11/18/23 2049 11/19/23 0841  GLUCAP 148* 92 81   Lab Results  Component Value Date   HGBA1C 5.9 (H) 11/18/2023         Condition - Fair  Family Communication  : Daughter updated in detail on 11/19/2023  Code Status : Full code  Consults  : None  PUD Prophylaxis :  ppi   Procedures  :     CT thoracic and lumbar spine.    MRI brain. 1. No acute intracranial abnormality. 2. 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 Other moderate for age cerebral white matter disease has progressed since that time. 3. 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 Spinal stenosis at C3-C4 appears chronic but may have increased from prior 2015, 2017 MRIs.  CT C-spine.  Nonacute      Disposition Plan  :    Status is: Inpatient  DVT Prophylaxis  :    heparin  injection 5,000 Units Start: 11/18/23 0600    Lab Results  Component Value Date   PLT 225 11/19/2023    Diet :  Diet Order             DIET DYS 3 Room service appropriate? No; Fluid consistency: Thin  Diet effective now                    Inpatient Medications  Scheduled Meds:  amLODipine   10 mg Oral Daily   aspirin   325 mg Oral Daily   brimonidine   1 drop Both Eyes TID   ferrous sulfate   325 mg Oral BID WC   folic acid   1 mg Oral Daily   heparin   5,000 Units Subcutaneous Q8H   insulin  aspart  0-9 Units Subcutaneous TID WC   isosorbide  mononitrate  30 mg Oral Daily   leptospermum manuka honey  1 Application Topical Daily   metoprolol  tartrate  25 mg Oral BID   pantoprazole   40 mg Oral  Daily   tamsulosin   0.4 mg Oral Daily   Continuous Infusions: PRN Meds:.hydrALAZINE   Antibiotics  :    Anti-infectives (From admission, onward)    None         Objective:   Vitals:   11/18/23 1535 11/18/23 1959 11/19/23 0000 11/19/23 0842  BP: (!) 163/81 (!) 143/78 136/75 (!) 185/90  Pulse:  75 76 74  Resp:  10 11 16   Temp: 98.6 F (37 C) 97.9 F (36.6 C) 97.9 F (36.6 C) 97.7 F (36.5 C)  TempSrc: Oral Oral Oral Oral  SpO2:  93% 98% 98%  Weight:      Height:        Wt Readings from Last 3 Encounters:  11/17/23 71 kg  11/07/23 72.1 kg  10/05/23 73.6 kg     Intake/Output Summary (Last 24 hours) at 11/19/2023 0931 Last data filed at 11/18/2023 1950 Gross per 24 hour  Intake --  Output 0 ml  Net 0 ml     Physical Exam  Awake Alert, No new F.N deficits, Normal affect [place].AT,PERRAL Supple Neck, No JVD,   Symmetrical Chest wall movement, Good air movement bilaterally, CTAB RRR,No Gallops,Rubs or new Murmurs,  +ve B.Sounds, Abd Soft, No tenderness,   No Cyanosis, Clubbing or edema        Data Review:    Recent Labs  Lab 11/17/23 2033 11/17/23 2104 11/18/23 0334 11/19/23 0317  WBC  7.2  --  6.9 5.8  HGB 12.3* 10.9* 10.4* 10.7*  HCT 39.6 32.0* 33.0* 33.6*  PLT 280  --  251 225  MCV 84.8  --  84.0 83.0  MCH 26.3  --  26.5 26.4  MCHC 31.1  --  31.5 31.8  RDW 16.5*  --  16.3* 16.5*  LYMPHSABS 0.3*  --  0.4* 0.5*  MONOABS 0.6  --  0.7 0.8  EOSABS 0.0  --  0.0 0.1  BASOSABS 0.0  --  0.0 0.0    Recent Labs  Lab 11/17/23 2033 11/17/23 2104 11/17/23 2254 11/18/23 0334 11/18/23 0521 11/19/23 0317  NA 145 146*  --  145  --  145  K 4.2 3.4*  --  3.7  --  3.8  CL 106 114*  --  112*  --  114*  CO2 14*  --   --  16*  --  18*  ANIONGAP 25*  --   --  17*  --  13  GLUCOSE 120* 107*  --  106*  --  86  BUN 88* 68*  --  87*  --  79*  CREATININE 3.95* 3.30*  --  3.63*  --  2.92*  AST 109*  --   --  72*  --   --   ALT 51*  --   --  37  --   --    ALKPHOS 54  --   --  42  --   --   BILITOT 1.4*  --   --  1.4*  --   --   ALBUMIN 3.4*  --   --  2.6*  --   --   LATICACIDVEN  --  1.1 0.7  --   --   --   TSH  --   --   --   --  1.269  --   HGBA1C  --   --   --  5.9*  --   --   AMMONIA  --   --   --   --  16  --   CALCIUM  9.2  --   --  8.5*  --  9.0      Recent Labs  Lab 11/17/23 2033 11/17/23 2104 11/17/23 2254 11/18/23 0334 11/18/23 0521 11/19/23 0317  LATICACIDVEN  --  1.1 0.7  --   --   --   TSH  --   --   --   --  1.269  --   HGBA1C  --   --   --  5.9*  --   --   AMMONIA  --   --   --   --  16  --   CALCIUM  9.2  --   --  8.5*  --  9.0    --------------------------------------------------------------------------------------------------------------- Lab Results  Component Value Date   CHOL 121 04/08/2022   HDL 27 (L) 04/08/2022   LDLCALC 63 04/08/2022   TRIG 267 (H) 04/08/2022   CHOLHDL 4.5 04/08/2022    Lab Results  Component Value Date   HGBA1C 5.9 (H) 11/18/2023   Recent Labs    11/18/23 0521  TSH 1.269   Recent Labs    11/18/23 0521  VITAMINB12 527  FOLATE 5.4*  FERRITIN 360*  TIBC 230*  IRON 17*  RETICCTPCT 0.7   ------------------------------------------------------------------------------------------------------------------ Cardiac Enzymes No results for input(s): CKMB, TROPONINI, MYOGLOBIN in the last 168 hours.  Invalid input(s): CK  Micro Results No results found for this or  any previous visit (from the past 240 hours).  Radiology Report MR [name] CONTRAST Result Date: 11/18/2023 CLINICAL DATA:  83 year old male with altered mental status. Found down by family. Abnormal labs concerning for rhabdomyolysis and dehydration. EXAM: MRI HEAD WITHOUT CONTRAST TECHNIQUE: Multiplanar, multiecho pulse sequences of the brain and surrounding structures were obtained without intravenous contrast. COMPARISON:  CT head and cervical spine yesterday. Brain MRI 08/23/2015. FINDINGS: Brain:  Cerebral volume not significantly changed from 2017 MRI. No restricted diffusion to suggest acute infarction. No midline shift, mass effect, evidence of mass lesion, ventriculomegaly, extra-axial collection or acute intracranial hemorrhage. Cervicomedullary junction and pituitary are within normal limits. Chronic lacunar infarcts right thalamus, bilateral cerebellum appears stable since 2017. Patchy and scattered additional bilateral cerebral white matter T2 and FLAIR hyperintensity has progressed since that time, moderate for age. No convincing chronic cerebral blood products on SWI. No convincing cortical encephalomalacia. Vascular: Major intracranial vascular flow voids are stable since 2017. 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 Normal background bone marrow signal. Sinuses/Orbits: Postoperative changes to the left globe since 2017. Scattered mild to moderate paranasal sinus mucosal thickening. Other: Mastoids remain well aerated. Trace retained secretions in the nasopharynx. Visible scalp and face appear negative. IMPRESSION: 1. No acute intracranial abnormality. 2. 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 Other moderate for age cerebral white matter disease has progressed since that time. 3. 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 Spinal stenosis at C3-C4 appears chronic but may have increased from prior 2015, 2017 MRIs. Electronically Signed   By: VEAR Hurst M.D.   On: 11/18/2023 05:22   CT Cervical Spine Wo Contrast Result Date: 11/17/2023 CLINICAL DATA:  Head trauma EXAM: CT HEAD WITHOUT CONTRAST CT CERVICAL SPINE WITHOUT CONTRAST TECHNIQUE:  Multidetector CT imaging of the head and cervical spine was performed following the standard protocol without intravenous contrast. Multiplanar CT image reconstructions of the cervical spine were also generated. RADIATION DOSE REDUCTION: This exam was performed according to the departmental dose-optimization program which includes automated exposure control, adjustment of the mA and/or kV according to patient size and/or use of iterative reconstruction technique. COMPARISON:  CT brain 05/08/2023, MRI 08/23/2015, MRI cervical spine 12/01/2013 FINDINGS: CT HEAD FINDINGS Brain: No acute territorial infarction, hemorrhage or intracranial mass. Moderate advanced atrophy. Moderate chronic small vessel ischemic changes of the white matter. Prominent ventricles felt secondary to atrophy. Vascular: No hyperdense vessels.  Carotid vascular calcification Skull: Normal. Negative for fracture or focal lesion. Sinuses/Orbits: Patchy mucosal thickening Other: None CT CERVICAL SPINE FINDINGS Alignment: Facet alignment is maintained. Straightening of the cervical spine. No subluxation. Skull base and vertebrae: No definitive fracture. Soft tissues and spinal canal: No prevertebral fluid or swelling. No visible canal hematoma. Disc levels: Partial ankylosis C4 through C7. Bulky anterior osteophytes. Multilevel degenerative disc space narrowing. Fusion of the facets at multiple levels with facet degenerative change. Severe bilateral foraminal narrowing C3-C4 and C6-C7 and C7-T1. Large posterior disc osteophyte at C3-C4 results in severe canal stenosis. Relative widening of anterior disc space/osteophytes at C3 C4. Upper chest: Negative. Other: None IMPRESSION: 1. No CT evidence for acute intracranial abnormality. Atrophy and chronic small vessel ischemic changes of the white matter. 2. Straightening of the  cervical spine with multilevel degenerative changes. No definitive acute fracture is seen. 3. Relative widening of anterior disc  space/osteophytes at C3-C4, of uncertain significance. If there is high clinical suspicion for ligamentous injury, correlation with MRI could be obtained. Electronically Signed   By: Luke Bun M.D.   On: 11/17/2023 22:30   CT Head Wo Contrast Result Date: 11/17/2023 CLINICAL DATA:  Head trauma EXAM: CT HEAD WITHOUT CONTRAST CT CERVICAL SPINE WITHOUT CONTRAST TECHNIQUE: Multidetector CT imaging of the head and cervical spine was performed following the standard protocol without intravenous contrast. Multiplanar CT image reconstructions of the cervical spine were also generated. RADIATION DOSE REDUCTION: This exam was performed according to the departmental dose-optimization program which includes automated exposure control, adjustment of the mA and/or kV according to patient size and/or use of iterative reconstruction technique. COMPARISON:  CT brain 05/08/2023, MRI 08/23/2015, MRI cervical spine 12/01/2013 FINDINGS: CT HEAD FINDINGS Brain: No acute territorial infarction, hemorrhage or intracranial mass. Moderate advanced atrophy. Moderate chronic small vessel ischemic changes of the white matter. Prominent ventricles felt secondary to atrophy. Vascular: No hyperdense vessels.  Carotid vascular calcification Skull: Normal. Negative for fracture or focal lesion. Sinuses/Orbits: Patchy mucosal thickening Other: None CT CERVICAL SPINE FINDINGS Alignment: Facet alignment is maintained. Straightening of the cervical spine. No subluxation. Skull base and vertebrae: No definitive fracture. Soft tissues and spinal canal: No prevertebral fluid or swelling. No visible canal hematoma. Disc levels: Partial ankylosis C4 through C7. Bulky anterior osteophytes. Multilevel degenerative disc space narrowing. Fusion of the facets at multiple levels with facet degenerative change. Severe bilateral foraminal narrowing C3-C4 and C6-C7 and C7-T1. Large posterior disc osteophyte at C3-C4 results in severe canal stenosis. Relative  widening of anterior disc space/osteophytes at C3 C4. Upper chest: Negative. Other: None IMPRESSION: 1. No CT evidence for acute intracranial abnormality. Atrophy and chronic small vessel ischemic changes of the white matter. 2. Straightening of the cervical spine with multilevel degenerative changes. No definitive acute fracture is seen. 3. Relative widening of anterior disc space/osteophytes at C3-C4, of uncertain significance. If there is high clinical suspicion for ligamentous injury, correlation with MRI could be obtained. Electronically Signed   By: Luke Bun M.D.   On: 11/17/2023 22:30   DG Chest Portable 1 View Result Date: 11/17/2023 CLINICAL DATA:  [place] EXAM: PORTABLE CHEST 1 VIEW COMPARISON:  05/08/2023 FINDINGS: Single frontal view of the chest demonstrates an unremarkable cardiac silhouette. No airspace disease, effusion, or pneumothorax. No acute bony abnormalities. IMPRESSION: 1. No acute intrathoracic process. Electronically Signed   By: Ozell Daring M.D.   On: 11/17/2023 21:12   DG Pelvis Portable Result Date: 11/17/2023 CLINICAL DATA:  Fall EXAM: PORTABLE PELVIS 1-2 VIEWS COMPARISON:  05/08/2023 FINDINGS: There is no evidence of pelvic fracture or diastasis. No pelvic bone lesions are seen. IMPRESSION: No acute bony abnormality. Electronically Signed   By: Franky Crease M.D.   On: 11/17/2023 21:11     Signature  -   Lavada Stank M.D on 11/19/2023 at 9:31 AM   -  To page go to [email or web address]

## 2023-11-19 NOTE — TOC Initial Note (Signed)
 Transition of Care [facility]) - Initial/Assessment Note    Patient Details  Name: Peter Mann MRN: [id number] Date of Birth: 07/01/1940  Transition [facility] Care [facility]) CM/SW Contact:    Inocente GORMAN Kindle, LCSW Phone Number: 11/19/2023, 1:52 PM  Clinical Narrative:                 CSW received consult for possible SNF placement at time of discharge. CSW spoke with patient. Patient reported that he lives alone. Patient expressed understanding of PT recommendation and is agreeable to SNF placement at time of discharge. He reported verbal agreement for CSW to speak with his daughter to assist in selecting a facility. CSW discussed insurance authorization process and will provide Medicare SNF ratings list. CSW will send out referrals for review and provide bed offers as available.    Skilled Nursing Rehab Facilities-   [email or web address]   Ratings out of 5 stars (5 the highest)  Name Address  Phone # Quality Care Staffing Health Inspection Overall  [facility] [address], [place] [phone] 2 1 2 1   [facility] [address], [place] [phone] 5 2 4 5   [facility] [address], [place] [phone] 2 1 1 1   [facility] [address] [place] [phone] 4 3 4 4   [facility]  [address], [place] [phone] 4 3 5 5   [facility] [address], [facility] [phone] 5 3 2 3   [facility] [address], [place] [phone] 5 1 2 2   [facility] [address], [place] [phone] 3 4 4 4   [address] ([place]) [address], [place] [phone] 1 3 3 2   [facility] [address] [place], [place] [phone] 4 2 2 2   [facility] ([place]) [address], [place] [phone] 2 1 1 1   Clotilda Pereyra [address] [place] [phone] 2 4 4 4   [facility] [address] [place] [phone] 3 1 3 2    [facility] ([facility]) [address], [place] [phone] 1 2 4 3           [facility] [address] [place] [phone] 3 1 5 4   [facility] [address], [place] [phone] 4 2 1 1   [facility]  [address] [place] [phone] 2 4 1 1   [facility] [address] [phone] 2 2 5 5   [facility] [address] [place] [address], [place] [phone] 2 2 3 3           [facility] [address], [place] [phone] 2 1 2 1   [facility] (No UHC) [address], [place] [phone] 4 3 4 4   [facility] [address], [facility] [phone] 3  5 5   [facility] [address], [place] [phone] 4 2 1 1   [facility] Milian [address] [place] [phone] 3 1 2 1   [facility] [address], [place] [phone] 1 3 3 2   [facility] [address] [place] [phone] 2 2 3 3   [facility] [address] [place], [place] [phone] 2 1 4 3   [facility] [address] [facility] [phone] 2 1 1 1   [facility] [address] [place], [place] [phone] 2 1 2 1   [facility] [address] [place] [phone] 3 1 2 1           [facility]  [address] [place] [phone] 4 1 3 2   [facility] [address], [place]  [phone] 2 4 4 4   [facility] (No Humana) [address], [place] [phone] 3 2 5 5   [facility] [address], [place] [phone] 3 2 3 3   [facility] [address], [place] [phone] 5 3 5 5   [facility] [address], [place] [phone] 2 1 1 1   [facility] [address] [place], [place] [phone] 3 5 5 5           [facility] [address] [place], [place] [phone] 5 4 5 5   [facility])  [address], [place] [phone] 1 1 2 1   [facility]  [address], [place] [phone]  2 4 4   [facility] [address], [place] [phone] 3 5 5 5   [address] [place], [place] [phone] 4 2 2 2   [facility]) [address] [place] [phone] 1 1 3 1   [facility] [address], [place] [phone] 2 2 2 2      Expected Discharge Plan: Skilled Nursing Facility Barriers to Discharge: Continued Medical Work up, English as a second language teacher, SNF Pending bed offer   Patient Goals and CMS Choice Patient states their goals for this hospitalization and ongoing recovery are:: Rehab CMS Medicare.gov Compare Post Acute Care list provided to:: Patient Choice offered to / list presented to : Patient Ute ownership interest in [facility].provided to:: Patient    Expected Discharge Plan and Services In-house Referral: Clinical Social Work   Post Acute Care Choice: Skilled Nursing Facility Living arrangements for the past 2 months: Apartment                                      Prior Living Arrangements/Services Living arrangements for the past 2 months: Apartment Lives with:: Self Patient language and need for interpreter reviewed:: Yes Do you feel safe going back to the place where you live?: Yes      Need for Family Participation in Patient Care: Yes (Comment) Care giver support system in place?: Yes (comment) Current home services: DME Criminal Activity/Legal Involvement Pertinent to Current Situation/Hospitalization: No - Comment as needed  Activities of Daily Living   ADL Screening (condition at time of admission) Independently performs ADLs?: No Does the patient have a NEW difficulty with bathing/dressing/toileting/self-feeding that is expected to last >3 days?: Yes (Initiates electronic notice to provider for possible OT consult) Does the patient have a NEW difficulty with getting in/out of bed, walking, or climbing stairs that is  expected to last >3 days?: Yes (Initiates electronic notice to provider for possible PT consult) Does the patient have a NEW difficulty with communication that is expected to last >3 days?: Yes (Initiates electronic notice to provider for possible SLP consult) Is the patient deaf or have difficulty hearing?: No Does the patient have difficulty seeing, even when wearing glasses/contacts?: Yes Does the patient have difficulty concentrating, remembering, or making decisions?: Yes  Permission Sought/Granted Permission sought to share information with : Facility Medical sales representative, Family Supports Permission granted to share information with : Yes, Verbal Permission Granted  Share Information with NAMEBETHA Jakie Mann- Daughter [phone]/ [phone]  Permission granted to share info w AGENCY: SNFs        Emotional Assessment  Appearance:: Appears stated age Attitude/Demeanor/Rapport: Engaged Affect (typically observed): Accepting, Pleasant Orientation: : Oriented to Self, Oriented to Place, Oriented to  Time, Oriented to Situation Alcohol  / Substance Use: Not Applicable Psych Involvement: No (comment)  Admission diagnosis:  ARF (acute renal failure) (HCC) [N17.9] Acute renal failure (ARF) (HCC) [N17.9] AKI (acute kidney injury) (HCC) [N17.9] Non-traumatic rhabdomyolysis [M62.82] Patient Active Problem List   Diagnosis Date Noted   Rhabdomyolysis 11/18/2023   Elevated LFTs 11/18/2023   Acute renal failure (ARF) (HCC) 11/18/2023   ARF (acute renal failure) (HCC) 11/17/2023   Weakness 05/08/2023   History of stroke 05/08/2023   Anemia 05/08/2023   Thrombocytopenia (HCC) 05/08/2023   Fall at home, initial encounter 05/08/2023   Influenza A 05/08/2023   Constipation 05/08/2023   BPH (benign prostatic hyperplasia) 05/08/2023   Pseudophakia of left eye 05/22/2021   Nuclear sclerotic cataract of right eye 02/15/2020   Exudative age-related macular degeneration of  left eye with active choroidal neovascularization (HCC) 07/11/2019   Serous detachment of retinal pigment epithelium of left eye 07/11/2019   Posterior vitreous detachment of right eye 07/11/2019   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 07/11/2019   Posterior vitreous detachment of left eye 07/11/2019   Smoker 03/17/2016   Facial palsy    Acute CVA (cerebrovascular accident) (HCC)    HLD (hyperlipidemia)    Diabetes mellitus type 2, noninsulin dependent (HCC)    CVA (cerebral vascular accident) (HCC) 08/23/2015   Abdominal distention    Gangrenous cholecystitis    Ileus (HCC)    Abdominal pain 01/28/2014   Cholelithiasis 01/28/2014   Cholecystitis 01/28/2014   Symptomatic cholelithiasis 01/28/2014   Hyperglycemia 01/28/2014   Chest pain 02/01/2013   Essential hypertension 02/01/2013   Hyperlipidemia 02/01/2013   PCP:  [name], MD Pharmacy:   [phone] [place], [place] - [address] [place] [address] Phone: [phone] Fax: [phone]  [facility] [address] [place] [address] Phone: [phone] Fax: [phone]     Social Drivers of Health (SDOH) Social History: SDOH Screenings   Food Insecurity: No Food Insecurity (11/18/2023)  Housing: Low Risk  (11/18/2023)  Transportation Needs: No Transportation Needs (11/18/2023)  Utilities: Not At Risk (11/18/2023)  Social Connections: Moderately Integrated (11/18/2023)  Tobacco Use: Medium Risk (11/18/2023)   SDOH Interventions:     Readmission Risk Interventions     No data to display

## 2023-11-19 NOTE — Evaluation (Signed)
 Occupational Therapy Evaluation Patient Details Name: Peter Mann MRN: [id number] DOB: [birth year] Today's Date: 11/19/2023   History of Present Illness   The pt is an 83 yo male presenting 9/9 after being found on ground by family. Admitted for management of rhabdomyolysis and possible UTI. PMH includes: HTN, HLD, CVA, DM II, tobacco use, BPH, and dementia.     Clinical Impressions At baseline, pt is Ind with ADLs, light meal prep and light home management tasks, and Ind without an AD. However, pt reports a hx of falls and difficulty managing his medication correctly. Pt now presents with decreased activity tolerance, decreased balance, decreased B UE coordination, impaired cognition (suspect pt is at or close to baseline), and decrease safety and independence with functional tasks. Pt currently demonstrates ability to largely complete ADLs with Set up to Total assist and STS transfers with HHA +1 or a RW with Mod assist. Pt participated well in session and will benefit from acute skilled OT services to address deficits and increase safety and independence with functional tasks. Post acute discharge pt will benefit from intensive inpatient rehab services < 3 hours per day to maximize rehab potential. Once pt returns home, he would also likely benefit from use of a medication roll, pill packs, or similar medication system to improve medication management.      If plan is discharge home, recommend the following:   A lot of help with walking and/or transfers;A lot of help with bathing/dressing/bathroom;Assistance with cooking/housework;Direct supervision/assist for medications management;Direct supervision/assist for financial management;Assist for transportation;Help with stairs or ramp for entrance     Functional Status Assessment   Patient has had a recent decline in their functional status and demonstrates the ability to make significant improvements in function in a reasonable and  predictable amount of time.     Equipment Recommendations   Other (comment) (defer to next level of care)     Recommendations for Other Services         Precautions/Restrictions   Precautions Precautions: Fall Recall of Precautions/Restrictions: Impaired Restrictions Weight Bearing Restrictions Per Provider Order: No     Mobility Bed Mobility               General bed mobility comments: Pt sitting in recliner at begining and end of session    Transfers Overall transfer level: Needs assistance Equipment used: 1 person hand held assist, Rolling walker (2 wheels) Transfers: Sit to/from Stand Sit to Stand: Mod assist           General transfer comment: STS x2 from recliner; pt decliner further mobility this session due to fatigue      Balance Overall balance assessment: Needs assistance Sitting-balance support: No upper extremity supported, Single extremity supported, Bilateral upper extremity supported, Feet supported Sitting balance-Leahy Scale: Fair Sitting balance - Comments: able to maintain static sitting in recliner without back or UE support with Supervision   Standing balance support: Single extremity supported, Bilateral upper extremity supported, During functional activity Standing balance-Leahy Scale: Poor Standing balance comment: reliant on UE support                           ADL either performed or assessed with clinical judgement   ADL Overall ADL's : Needs assistance/impaired Eating/Feeding: Set up;Sitting   Grooming: Set up;Sitting   Upper Body Bathing: Minimal assistance;Sitting   Lower Body Bathing: Maximal assistance;Sit to/from stand;Cueing for compensatory techniques   Upper Body Dressing : Contact guard assist;Sitting  Lower Body Dressing: Moderate assistance;Maximal assistance;Sit to/from stand;Cueing for compensatory techniques       Toileting- Clothing Manipulation and Hygiene: Total assistance;Sit  to/from stand         General ADL Comments: Pt with decreased activity tolerance, fatiguing quickly during tasks     Vision Ability to See in Adequate Light: 1 Impaired Patient Visual Report: No change from baseline Additional Comments: Vision [facility] for tasks assessed; not formally screened or evaluated     Perception         Praxis         Pertinent Vitals/Pain Pain Assessment Pain Assessment: Faces Faces Pain Scale: Hurts even more Pain Location: generalized; back Pain Descriptors / Indicators: Grimacing, Discomfort Pain Intervention(s): Limited activity within patient's tolerance, Monitored during session, Repositioned     Extremity/Trunk Assessment Upper Extremity Assessment Upper Extremity Assessment: Right hand dominant;RUE deficits/detail;LUE deficits/detail RUE Deficits / Details: gross shoulder strength 3/5 all other strength grossly 4 to 4+/5; decreased shoulder ROM at baseline; mildly decreased elbow extension at baseline; decreased coordination RUE Coordination: decreased gross motor;decreased fine motor LUE Deficits / Details: gross shoulder strength 3/5 all other strength grossly 3+ to 4/5; decreased shoulder ROM at baseline; mildly decreased elbow extension at baseline; decreased coordination LUE Coordination: decreased fine motor;decreased gross motor   Lower Extremity Assessment Lower Extremity Assessment: Defer to PT evaluation   Cervical / Trunk Assessment Cervical / Trunk Assessment: Kyphotic   Communication Communication Communication: Impaired Factors Affecting Communication: Difficulty expressing self;Hearing impaired;Reduced clarity of speech   Cognition Arousal: Alert Behavior During Therapy: WFL for tasks assessed/performed Cognition: History of cognitive impairments, Cognition impaired, No family/caregiver present to determine baseline     Awareness: Intellectual awareness intact, Online awareness intact (intermittent online  awareness) Memory impairment (select all impairments): Short-term memory, Working memory Attention impairment (select first level of impairment): Alternating attention Executive functioning impairment (select all impairments): Organization, Problem solving OT - Cognition Comments: Pt AAOx4 and pleasant throughout session. Pt has fair insight into organization and short-term and working memory deficits; however, because of these deficits, pt has difficulty with problem solving solutions.                 Following commands: Impaired Following commands impaired: Follows one step commands inconsistently, Follows one step commands with increased time     Cueing  General Comments   Cueing Techniques: Verbal cues  VSS on RA   Exercises     Shoulder Instructions      Home Living Family/patient expects to be discharged to:: Private residence Living Arrangements: Alone Available Help at Discharge: Friend(s);Family;Available PRN/intermittently Type of Home: Apartment Home Access: Level entry     Home Layout: One level     Bathroom Shower/Tub: Chief Strategy Officer: Handicapped height     Home Equipment: Cane - single Information systems manager (2 wheels);Rollator (4 wheels);Wheelchair - manual;Shower seat   Additional Comments: pt questionable historian, but consistent in his report today      Prior Functioning/Environment Prior Level of Function : Independent/Modified Independent;History of Falls (last six months);Patient poor historian/Family not available (pt questionable historian, but consistent in his report today)             Mobility Comments: Pt reports no use of AD, but also reports 4-5 falls in the past 6 months. Pt reports he thinks these falls my have been seizures but also states he has no known hx of seizures. ADLs Comments: Pt reports he is Ind with ADLs, light  meal prep, and light home management tasks. Pt reports he manages his own  medications, but admits he often makes mistakes with his medications, reporting he sometimes forgets to take them and sometimes thins he takes too much. Pt reports he tried using a pill planner, but would forget to fill or use it. Pt reports family assists with transportation and some home management tasks PRN. Pt also reports he uses a transportation service through his insurace for doctor's appointments.    OT Problem List: Decreased strength;Decreased activity tolerance;Impaired balance (sitting and/or standing);Decreased coordination;Decreased knowledge of use of DME or AE   OT Treatment/Interventions: Self-care/ADL training;Therapeutic exercise;Energy conservation;DME and/or AE instruction;Therapeutic activities;Cognitive remediation/compensation;Patient/family education;Balance training      OT Goals(Current goals can be found in the care plan section)   Acute Rehab OT Goals Patient Stated Goal: to be independent OT Goal Formulation: With patient Time For Goal Achievement: 12/03/23 Potential to Achieve Goals: Good ADL Goals Pt Will Perform Grooming: with contact guard assist;standing Pt Will Perform Upper Body Bathing: with supervision;sitting Pt Will Perform Lower Body Bathing: with min assist;sit to/from stand (with adaptive equipment as needed) Pt Will Perform Lower Body Dressing: with min assist;sit to/from stand (with adaptive equipment as needed) Pt Will Transfer to Toilet: with min assist;ambulating;bedside commode (with least restrictive AD) Pt Will Perform Toileting - Clothing Manipulation and hygiene: with min assist;sit to/from stand;sitting/lateral leans   OT Frequency:  Min 2X/week    Co-evaluation              AM-PAC OT 6 Clicks Daily Activity     Outcome Measure Help from another person eating meals?: A Little Help from another person taking care of personal grooming?: A Little Help from another person toileting, which includes using toliet, bedpan, or  urinal?: A Lot Help from another person bathing (including washing, rinsing, drying)?: A Lot Help from another person to put on and taking off regular upper body clothing?: A Little Help from another person to put on and taking off regular lower body clothing?: A Lot 6 Click Score: 15   End of Session Equipment Utilized During Treatment: Rolling walker (2 wheels);Gait belt Nurse Communication: Mobility status  Activity Tolerance: Patient tolerated treatment well;Patient limited by fatigue Patient left: in chair;with call bell/phone within reach;with chair alarm set  OT Visit Diagnosis: Unsteadiness on feet (R26.81);Other abnormalities of gait and mobility (R26.89);Repeated falls (R29.6);Muscle weakness (generalized) (M62.81);History of falling (Z91.81);Other symptoms and signs involving cognitive function                Time: 8365-8347 OT Time Calculation (min): 18 min Charges:  OT General Charges $OT Visit: 1 Visit OT Evaluation $OT Eval Low Complexity: 1 Low  Margarie Rockey HERO., OTR/L, MA Acute Rehab [phone]   Margarie FORBES Horns 11/19/2023, 6:10 PM

## 2023-11-20 DIAGNOSIS — N179 Acute kidney failure, unspecified: Secondary | ICD-10-CM | POA: Diagnosis not present

## 2023-11-20 LAB — BASIC METABOLIC PANEL WITH GFR
Anion gap: 14 (ref 5–15)
BUN: 81 mg/dL — ABNORMAL HIGH (ref 8–23)
CO2: 20 mmol/L — ABNORMAL LOW (ref 22–32)
Calcium: 8.5 mg/dL — ABNORMAL LOW (ref 8.9–10.3)
Chloride: 109 mmol/L (ref 98–111)
Creatinine, Ser: 2.73 mg/dL — ABNORMAL HIGH (ref 0.61–1.24)
GFR, Estimated: 22 mL/min — ABNORMAL LOW (ref >60)
Glucose, Bld: 114 mg/dL — ABNORMAL HIGH (ref 70–99)
Potassium: 3.4 mmol/L — ABNORMAL LOW (ref 3.5–5.1)
Sodium: 143 mmol/L (ref 135–145)

## 2023-11-20 LAB — CBC WITH DIFFERENTIAL/PLATELET
Abs Immature Granulocytes: 0.02 K/uL (ref 0.00–0.07)
Basophils Absolute: 0 K/uL (ref 0.0–0.1)
Basophils Relative: 0 %
Eosinophils Absolute: 0.1 K/uL (ref 0.0–0.5)
Eosinophils Relative: 3 %
HCT: 27.8 % — ABNORMAL LOW (ref 39.0–52.0)
Hemoglobin: 8.9 g/dL — ABNORMAL LOW (ref 13.0–17.0)
Immature Granulocytes: 1 %
Lymphocytes Relative: 18 %
Lymphs Abs: 0.8 K/uL (ref 0.7–4.0)
MCH: 26.5 pg (ref 26.0–34.0)
MCHC: 32 g/dL (ref 30.0–36.0)
MCV: 82.7 fL (ref 80.0–100.0)
Monocytes Absolute: 0.7 K/uL (ref 0.1–1.0)
Monocytes Relative: 17 %
Neutro Abs: 2.6 K/uL (ref 1.7–7.7)
Neutrophils Relative %: 61 %
Platelets: 201 K/uL (ref 150–400)
RBC: 3.36 MIL/uL — ABNORMAL LOW (ref 4.22–5.81)
RDW: 16.4 % — ABNORMAL HIGH (ref 11.5–15.5)
WBC: 4.2 K/uL (ref 4.0–10.5)
nRBC: 0 % (ref 0.0–0.2)

## 2023-11-20 LAB — GLUCOSE, CAPILLARY
Glucose-Capillary: 106 mg/dL — ABNORMAL HIGH (ref 70–99)
Glucose-Capillary: 206 mg/dL — ABNORMAL HIGH (ref 70–99)
Glucose-Capillary: 176 mg/dL — ABNORMAL HIGH (ref 70–99)
Glucose-Capillary: 174 mg/dL — ABNORMAL HIGH (ref 70–99)

## 2023-11-20 LAB — PHOSPHORUS: Phosphorus: 3.6 mg/dL (ref 2.5–4.6)

## 2023-11-20 LAB — MAGNESIUM: Magnesium: 2 mg/dL (ref 1.7–2.4)

## 2023-11-20 LAB — CK: Total CK: 193 U/L (ref 49–397)

## 2023-11-20 MED ORDER — POTASSIUM CHLORIDE CRYS ER 20 MEQ PO TBCR
40.0000 meq | EXTENDED_RELEASE_TABLET | Freq: Once | ORAL | Status: AC
  Administered 2023-11-20: 40 meq via ORAL
  Filled 2023-11-20: qty 2

## 2023-11-20 MED ORDER — ROSUVASTATIN CALCIUM 20 MG PO TABS
20.0000 mg | ORAL_TABLET | Freq: Every day | ORAL | Status: DC
  Administered 2023-11-20 – 2023-11-26 (×7): 20 mg via ORAL
  Filled 2023-11-20 (×7): qty 1

## 2023-11-20 MED ORDER — LACTATED RINGERS IV SOLN: INTRAVENOUS | Status: AC

## 2023-11-20 NOTE — Progress Notes (Signed)
 Orthopedic Tech Progress Note Patient Details:  Peter Mann 05/21/1940 [id number]  Ortho Devices Type of Ortho Device: Thoracolumbar corset (TLSO) Ortho Device/Splint Location: Back Ortho Device/Splint Interventions: Ordered, Adjustment      [name] A Angelia Hazell 11/20/2023, 11:43 AM

## 2023-11-20 NOTE — Plan of Care (Signed)

## 2023-11-20 NOTE — Progress Notes (Signed)
 Speech Language Pathology Treatment: Dysphagia  Patient Details Name: Peter Mann MRN: [id number] DOB: Jul 12, 1940 Today's Date: 11/20/2023 Time: 8881-8873 SLP Time Calculation (min) (ACUTE ONLY): 8 min  Assessment / Plan / Recommendation Clinical Impression  Pt followed up after initial swallow assessment 9/10 where he demonstrated impulsivity and lingual residue placed on Dys 3 (chopped meats). Today he fed himself, was not impulsive however stated he does typically eat fast and therapist reiterated importance of slow pace and pt voiced understanding responding so I don't choke. He did not have oral residue post po and no s/s aspiration with thin- independently used liquid wash with cracker. Discussed upgrading texture and pt stated he prefers to remain on chopped meats and therapist in agreement given propensity for impulsivity. No further ST warranted and if he wishes to upgrade at facility ST can assess there.    HPI HPI: The pt is an 83 yo male presenting 9/9 after being found on ground by family. Admitted for management of rhabdomyolysis and possible UTI. PMH includes: HTN, HLD, CVA, DM II, tobacco use, BPH, and dementia.      SLP Plan  All goals met;Discharge SLP treatment due to (comment)          Recommendations  Diet recommendations: Dysphagia 3 (mechanical soft);Thin liquid Liquids provided via: Cup;Straw Medication Administration: Whole meds with liquid Supervision: Patient able to self feed;Intermittent supervision to cue for compensatory strategies Compensations: Slow rate;Small sips/bites Postural Changes and/or Swallow Maneuvers: Seated upright 90 degrees                  Oral care BID     Dysphagia, unspecified (R13.10)     All goals met;Discharge SLP treatment due to (comment)     [name]  11/20/2023, 11:35 AM

## 2023-11-20 NOTE — Progress Notes (Signed)
 Physical Therapy Treatment Patient Details Name: Peter Mann MRN: [id number] DOB: 05/18/1940 Today's Date: 11/20/2023   History of Present Illness The pt is an 83 yo male presenting 9/9 after being found on ground by family. Admitted for management of rhabdomyolysis and possible UTI. CT T and L-spine noted with nonspecific fractures. PMH includes: HTN, HLD, CVA, DM II, tobacco use, BPH, and dementia.    PT Comments  The pt is making gradual progress as he was able to take a few pivotal steps, ambulating up to ~4 ft with a RW and modA before needing to sit to rest. He is at high risk for falls, evident by his knees beginning to buckle after ambulating only ~2 ft. He needs cues and assistance to shift his weight to allow offloading to advance his foot to step. He displays continued deficits in balance, strength, power, and balance that impact his safety with mobility. Will continue to follow acutely.    If plan is discharge home, recommend the following: Two people to help with walking and/or transfers;A lot of help with bathing/dressing/bathroom;Assistance with cooking/housework;Direct supervision/assist for medications management;Direct supervision/assist for financial management;Assistance with feeding;Assist for transportation;Help with stairs or ramp for entrance;Supervision due to cognitive status   Can travel by private vehicle     No  Equipment Recommendations  Rolling walker (2 wheels);Wheelchair (measurements PT);Wheelchair cushion (measurements PT);BSC/3in1;Hospital bed    Recommendations for Other Services       Precautions / Restrictions Precautions Precautions: Fall;Back Precaution Booklet Issued: No Recall of Precautions/Restrictions: Impaired Required Braces or Orthoses: Spinal Brace Spinal Brace: Thoracolumbosacral orthotic;Applied in sitting position Restrictions Weight Bearing Restrictions Per Provider Order: No     Mobility  Bed Mobility Overal bed mobility:  Needs Assistance Bed Mobility: Supine to Sit     Supine to sit: Max assist, HOB elevated, Used rails     General bed mobility comments: Pt needed cues to manage each leg off EOB with assistance and to grab onto therapist's arm with bil UEs to pull to ascend trunk and pivot body to R EOB from elevated HOB as pt unable to grasp bed rail when cued.    Transfers Overall transfer level: Needs assistance Equipment used: Rolling walker (2 wheels), 1 person hand held assist Transfers: Sit to/from Stand, Bed to chair/wheelchair/BSC Sit to Stand: Mod assist   Step pivot transfers: Mod assist       General transfer comment: Pt needed cues for hand placement to stand and step pivot from EOB to L to bedside commode with HHA. Pt needed cues to use RW and extend knees and lift legs to advance steps to step pivot to R from commode to recliner. ModA for balance throughout    Ambulation/Gait Ambulation/Gait assistance: Mod assist Gait Distance (Feet): 4 Feet (x2 bouts of ~2 ft > ~4 ft) Assistive device: 1 person hand held assist, Rolling walker (2 wheels) Gait Pattern/deviations: Step-to pattern, Decreased step length - left, Decreased step length - right, Decreased stride length, Decreased dorsiflexion - right, Decreased dorsiflexion - left, Decreased weight shift to right, Decreased weight shift to left, Knee flexed in stance - right, Knee flexed in stance - left, Knees buckling, Trunk flexed, Shuffle Gait velocity: reduced Gait velocity interpretation: <1.31 ft/sec, indicative of household ambulator   General Gait Details: Pt takes slow, small, shuffling steps while maintaining a flexed trunk and flexed knees. Tactile and verbal cues provided to extend trunk and legs to improve posture and stability/safety, but poor carryover noted. Tactile cues provided for  weight shifting along with VCs to clear and advance feet to progress gait. Distance limited for pt safety due to knees noted to begin to buckle  after ~2 ft. ModA for balance throughout, 1st rep with HHA, 2nd rep with RW   Stairs             Wheelchair Mobility     Tilt Bed    Modified Rankin (Stroke Patients Only)       Balance Overall balance assessment: Needs assistance Sitting-balance support: No upper extremity supported, Feet supported Sitting balance-Leahy Scale: Fair Sitting balance - Comments: Able to sit EOB statically with CGA   Standing balance support: Bilateral upper extremity supported, During functional activity, Reliant on assistive device for balance Standing balance-Leahy Scale: Poor Standing balance comment: reliant on UE support and modA                            Communication Communication Communication: Impaired Factors Affecting Communication: Hearing impaired;Reduced clarity of speech  Cognition Arousal: Alert Behavior During Therapy: WFL for tasks assessed/performed   PT - Cognitive impairments: Awareness, Sequencing, Problem solving, Safety/Judgement                       PT - Cognition Comments: Pt slow to process cues and with decreased awareness of his weakness and risk for falls. Needs multi-modal cues to sequence mobility and maintain safety Following commands: Impaired Following commands impaired: Only follows one step commands consistently, Follows one step commands with increased time    Cueing Cueing Techniques: Verbal cues, Gestural cues, Tactile cues  Exercises      General Comments        Pertinent Vitals/Pain Pain Assessment Pain Assessment: Faces Faces Pain Scale: Hurts even more Pain Location: back Pain Descriptors / Indicators: Discomfort, Grimacing, Guarding, Moaning Pain Intervention(s): Limited activity within patient's tolerance, Monitored during session, Repositioned    Home Living                          Prior Function            PT Goals (current goals can now be found in the care plan section) Acute Rehab PT  Goals Patient Stated Goal: return home PT Goal Formulation: With patient Time For Goal Achievement: 12/02/23 Potential to Achieve Goals: Fair Progress towards PT goals: Progressing toward goals    Frequency    Min 2X/week      PT Plan      Co-evaluation              AM-PAC PT 6 Clicks Mobility   Outcome Measure  Help needed turning from your back to your side while in a flat bed without using bedrails?: A Lot Help needed moving from lying on your back to sitting on the side of a flat bed without using bedrails?: A Lot Help needed moving to and from a bed to a chair (including a wheelchair)?: A Lot Help needed standing up from a chair using your arms (e.g., wheelchair or bedside chair)?: A Lot Help needed to walk in hospital room?: Total Help needed climbing 3-5 steps with a railing? : Total 6 Click Score: 10    End of Session Equipment Utilized During Treatment: Gait belt;Back brace Activity Tolerance: Patient tolerated treatment well Patient left: in chair;with call bell/phone within reach;with chair alarm set Nurse Communication: Mobility status PT Visit Diagnosis: Unsteadiness  on feet (R26.81);Other abnormalities of gait and mobility (R26.89);Repeated falls (R29.6);Muscle weakness (generalized) (M62.81);History of falling (Z91.81);Pain Pain - Right/Left:  (back) Pain - part of body:  (back)     Time: 1110-1130 PT Time Calculation (min) (ACUTE ONLY): 20 min  Charges:    $Therapeutic Activity: 8-22 mins PT General Charges $$ ACUTE PT VISIT: 1 Visit                     Theo Ferretti, PT, DPT Acute Rehabilitation Services  Office: [phone]    Theo CHRISTELLA Ferretti 11/20/2023, 2:13 PM

## 2023-11-20 NOTE — Progress Notes (Signed)
 PROGRESS NOTE                                                                                                                                                                                                             Patient Demographics:    Peter Mann, is a 83 y.o. male, DOB - 1940/09/07, FMW:[id number]  Outpatient Primary MD for the patient is [name], MD    LOS - 2  Admit date - 11/17/2023    Chief Complaint  Patient presents with   Fall       Brief Narrative (HPI from H&P)   83 y.o. male with PMH significant for DM2, HTN, HLD, smoking, stroke, dementia, CKD, COPD, recurrent falls, prostate cancer s/p radiation. 9/9, patient was brought to the ED after he was found on the ground.\  Per report, family last saw him 4 days ago and was not able to hear back from him after that.  They sent out a wellness check and EMS found the patient on the floor confused and brought to the ED. in the ER he was diagnosed with dehydration, rhabdomyolysis, AKI and admitted.   Subjective:   Patient in bed, appears comfortable, denies any headache, no fever, no chest pain or pressure, no shortness of breath , no abdominal pain. No new focal weakness.   Assessment  & Plan :    AKI on CKD 2.  Baseline creatinine around 1.3, acute metabolic acidosis, fall related rhabdomyolysis. He had multiple falls in the past several months according to the daughter, MRI brain nonacute, MRI C-spine nonacute.  Continue to hydrate with IV fluids, AKI improving, PT OT, may require placement.  History of C3-C4 spinal stenosis.  PT OT and outpatient follow-up with neurology and neurosurgery.  Acute on chronic mid-low back pain.  Multiple falls over the last several months at home, CT T and L-spine noted with nonspecific fractures, case discussed with neurosurgeon Dr. Reyes Budge in detail on 11/19/2023, no urgent surgery needed, TLSO brace, PT OT and  pain control.  Encouraged patient to think about SNF due to ongoing multiple falls.  Outpatient follow-up with Dr. Budge postdischarge, no MRI needed.  Acute metabolic encephalopathy with underlying dementia.  MRI brain nonacute, minimize narcotics.  PT OT speech.  As needed Haldol.  Dysphagia.  Speech following.  Continue aspiration precautions and feeding  assistance.    Hypertension.  On combination of Norvasc , beta-blocker and IV hydralazine  as needed.  Monitor and adjust.  Anemia of chronic disease, chronic vitamin B12 deficiency.  Placed on oral iron and B12 supplementation PCP to monitor.  History of stroke.  Continue on aspirin  and statin for secondary prevention.  DM type II.  SSI.  CBG (last 3)  Recent Labs    11/19/23 1543 11/19/23 2145 11/20/23 0831  GLUCAP 179* 163* 106*   Lab Results  Component Value Date   HGBA1C 5.9 (H) 11/18/2023         Condition - Fair  Family Communication  : Daughter updated in detail on 11/19/2023  Code Status : Full code  Consults  : None  PUD Prophylaxis :  ppi   Procedures  :     CT thoracic - 1. No acute compression fracture. 2. Findings concerning for fractured osteophytes at T9-10 with lucency and gas extending into the anterior aspect of the T9-10 disc.   CT lumbar spine.  1. No evidence of acute compression fracture or displaced fracture in the lumbar spine. 2. 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 3. Moderate-to-severe bilateral foraminal stenosis at L4-5 and L5-S1, increased from prior  MRI brain. 1. No acute intracranial abnormality. 2. 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 Other moderate for age cerebral white matter disease has progressed since that time. 3. 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 Spinal stenosis at C3-C4 appears chronic but  may have increased from prior 2015, 2017 MRIs.  CT C-spine.  Nonacute      Disposition Plan  :    Status is: Inpatient  DVT Prophylaxis  :    heparin  injection 5,000 Units Start: 11/18/23 0600    Lab Results  Component Value Date   PLT 201 11/20/2023    Diet :  Diet Order             DIET DYS 3 Room service appropriate? No; Fluid consistency: Thin  Diet effective now                    Inpatient Medications  Scheduled Meds:  amLODipine   10 mg Oral Daily   aspirin   325 mg Oral Daily   brimonidine   1 drop Both Eyes TID   carvedilol   6.25 mg Oral BID WC   docusate sodium   200 mg Oral BID   ferrous sulfate   325 mg Oral BID WC   folic acid   1 mg Oral Daily   heparin   5,000 Units Subcutaneous Q8H   insulin  aspart  0-9 Units Subcutaneous TID WC   isosorbide  mononitrate  30 mg Oral Daily   leptospermum manuka honey  1 Application Topical Daily   pantoprazole   40 mg Oral Daily   polyethylene glycol  17 g Oral BID   tamsulosin   0.4 mg Oral Daily   Continuous Infusions:  lactated ringers      PRN Meds:.hydrALAZINE   Antibiotics  :    Anti-infectives (From admission, onward)    None         Objective:   Vitals:   11/19/23 1545 11/19/23 2046 11/20/23 0542 11/20/23 0800  BP: 96/62 107/64 (!) 156/75 (!) 152/65  Pulse: 65 63 66 (!) 57  Resp: 16 18 17 13   Temp: (!) 97.4 F (36.3 C) 98.4 F (36.9  C) 98.6 F (37 C) 98 F (36.7 C)  TempSrc: Oral Oral Oral Oral  SpO2: 95% 96% 98% 97%  Weight:      Height:        Wt Readings from Last 3 Encounters:  11/17/23 71 kg  11/07/23 72.1 kg  10/05/23 73.6 kg     Intake/Output Summary (Last 24 hours) at 11/20/2023 0930 Last data filed at 11/20/2023 9167 Gross per 24 hour  Intake 250 ml  Output 350 ml  Net -100 ml     Physical Exam  Awake Alert, No new F.N deficits, Normal affect [place].AT,PERRAL Supple Neck, No JVD,   Symmetrical Chest wall movement, Good air movement bilaterally, CTAB RRR,No  Gallops,Rubs or new Murmurs,  +ve B.Sounds, Abd Soft, No tenderness,   No Cyanosis, Clubbing or edema        Data Review:    Recent Labs  Lab 11/17/23 2033 11/17/23 2104 11/18/23 0334 11/19/23 0317 11/20/23 0416  WBC 7.2  --  6.9 5.8 4.2  HGB 12.3* 10.9* 10.4* 10.7* 8.9*  HCT 39.6 32.0* 33.0* 33.6* 27.8*  PLT 280  --  251 225 201  MCV 84.8  --  84.0 83.0 82.7  MCH 26.3  --  26.5 26.4 26.5  MCHC 31.1  --  31.5 31.8 32.0  RDW 16.5*  --  16.3* 16.5* 16.4*  LYMPHSABS 0.3*  --  0.4* 0.5* 0.8  MONOABS 0.6  --  0.7 0.8 0.7  EOSABS 0.0  --  0.0 0.1 0.1  BASOSABS 0.0  --  0.0 0.0 0.0    Recent Labs  Lab 11/17/23 2033 11/17/23 2104 11/17/23 2254 11/18/23 0334 11/18/23 0521 11/19/23 0317 11/20/23 0416  NA 145 146*  --  145  --  145 143  K 4.2 3.4*  --  3.7  --  3.8 3.4*  CL 106 114*  --  112*  --  114* 109  CO2 14*  --   --  16*  --  18* 20*  ANIONGAP 25*  --   --  17*  --  13 14  GLUCOSE 120* 107*  --  106*  --  86 114*  BUN 88* 68*  --  87*  --  79* 81*  CREATININE 3.95* 3.30*  --  3.63*  --  2.92* 2.73*  AST 109*  --   --  72*  --   --   --   ALT 51*  --   --  37  --   --   --   ALKPHOS 54  --   --  42  --   --   --   BILITOT 1.4*  --   --  1.4*  --   --   --   ALBUMIN 3.4*  --   --  2.6*  --   --   --   LATICACIDVEN  --  1.1 0.7  --   --   --   --   TSH  --   --   --   --  1.269  --   --   HGBA1C  --   --   --  5.9*  --   --   --   AMMONIA  --   --   --   --  16  --   --   MG  --   --   --   --   --   --  2.0  PHOS  --   --   --   --   --   --  3.6  CALCIUM  9.2  --   --  8.5*  --  9.0 8.5*      Recent Labs  Lab 11/17/23 2033 11/17/23 2104 11/17/23 2254 11/18/23 0334 11/18/23 0521 11/19/23 0317 11/20/23 0416  LATICACIDVEN  --  1.1 0.7  --   --   --   --   TSH  --   --   --   --  1.269  --   --   HGBA1C  --   --   --  5.9*  --   --   --   AMMONIA  --   --   --   --  16  --   --   MG  --   --   --   --   --   --  2.0  CALCIUM  9.2  --   --  8.5*  --   9.0 8.5*    --------------------------------------------------------------------------------------------------------------- Lab Results  Component Value Date   CHOL 121 04/08/2022   HDL 27 (L) 04/08/2022   LDLCALC 63 04/08/2022   TRIG 267 (H) 04/08/2022   CHOLHDL 4.5 04/08/2022    Lab Results  Component Value Date   HGBA1C 5.9 (H) 11/18/2023   Recent Labs    11/18/23 0521  TSH 1.269   Recent Labs    11/18/23 0521  VITAMINB12 527  FOLATE 5.4*  FERRITIN 360*  TIBC 230*  IRON 17*  RETICCTPCT 0.7   ------------------------------------------------------------------------------------------------------------------ Cardiac Enzymes No results for input(s): CKMB, TROPONINI, MYOGLOBIN in the last 168 hours.  Invalid input(s): CK  Micro Results No results found for this or any previous visit (from the past 240 hours).  Radiology Report CT LUMBAR SPINE WO CONTRAST Result Date: 11/19/2023 EXAM: CT OF THE LUMBAR SPINE WITHOUT CONTRAST 11/19/2023 12:49:09 PM TECHNIQUE: CT of the lumbar spine was performed without the administration of intravenous contrast. Multiplanar reformatted images are provided for review. Automated exposure control, iterative reconstruction, and/or weight based adjustment of the mA/kV was utilized to reduce the radiation dose to as low as reasonably achievable. COMPARISON: CT lumbar spine 05/27/2018 and same day CT thoracic spine. CLINICAL HISTORY: Low back pain, increased fracture risk. Chief complaints; Fall; CT THORACIC SPINE WO CONTRAST; Compression fracture, thoracic; CT LUMBAR SPINE WO CONTRAST; Low back pain, increased fracture risk. FINDINGS: BONES AND ALIGNMENT: Similar grade 1 anterolisthesis of L4 on L5, alignment otherwise maintained. Similar appearance of bilateral pars defects at L3. Vertebral body heights are maintained. No evidence of acute compression fracture or displaced fracture in the lumbar spine. No suspicious lytic osseous lesion.  DEGENERATIVE CHANGES: Degenerative endplate osteophytes, multiple levels endplate sclerosis most pronounced at L5-S1. Mild-to-moderate disc space narrowing at L5-S1 is similar to prior with associated vacuum disc. At T12-L1 there is no significant spinal canal or foraminal stenosis. At L1-L2 there are small posterior osteophytes and mild facet arthrosis. No significant spinal canal or foraminal stenosis. At L2-3 there is mild mineralization of the disc with small posterior osteophytes, additional prominent lateral osteophytes, particularly on the left, and mild facet arthrosis. There is no significant spinal canal or foraminal stenosis. Prominent lateral osteophytes with potential impingement upon the extraforaminal left L2 nerve root. At L3-4 there is a small disc bulge and posterior osteophytes, mild facet arthrosis, and mild spinal canal stenosis. Moderate bilateral foraminal stenosis, similar to prior. At L4-5, there is anterolisthesis, partial uncovering of the disc, diffuse disc bulge, moderate facet arthrosis, and thickening of the ligaments and flavum. There is moderate spinal canal  stenosis, similar to prior. There is moderate-to-severe bilateral foraminal stenosis which appears slightly increased. At L5-S1 there is a diffuse disc bulge and posterior osteophytes, moderate-to-severe bilateral facet arthrosis. There is mild spinal canal stenosis, moderate-to-severe bilateral foraminal stenosis, slightly increased from prior. SOFT TISSUES: The paraspinal soft tissues are unremarkable. 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 IMPRESSION: 1. No evidence of acute compression fracture or displaced fracture in the lumbar spine. 2. 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 3. Moderate-to-severe bilateral foraminal stenosis at L4-5 and L5-S1, increased from prior. Electronically signed by: Donnice Mania MD 11/19/2023 01:37 PM EDT RP  Workstation: [id number]   CT THORACIC SPINE WO CONTRAST Result Date: 11/19/2023 EXAM: CT THORACIC SPINE WITHOUT CONTRAST 11/19/2023 12:49:09 PM TECHNIQUE: CT of the thoracic spine was performed without the administration of intravenous contrast. Multiplanar reformatted images are provided for review. Automated exposure control, iterative reconstruction, and/or weight based adjustment of the mA/kV was utilized to reduce the radiation dose to as low as reasonably achievable. COMPARISON: CT cervical spine 12/03/2018 and CT lumbar spine 05/27/2018. Thoracic spine radiographs 05/27/2018. CLINICAL HISTORY: Compression fracture, thoracic. Chief complaints; Fall; CT THORACIC SPINE WO CONTRAST; Compression fracture, thoracic; CT LUMBAR SPINE WO CONTRAST; Low back pain, increased fracture risk. 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 There is focal disruption of the osteophytes anteriorly at the T9-10 level with lucency and gas extending through the defect into the anterior aspect of the T9-10 disc. Findings are concerning for fractured osteophytes. The vertebral body heights are maintained without evidence of compression fracture. Mild dextrocurvature of the thoracic spine centered at T8-9. Thoracic kyphosis is maintained. DEGENERATIVE CHANGES: Degenerative endplate osteophytes and degenerative endplate changes at multiple levels. There is no suspicious lytic osseous lesion. There is partial osseous fusion across the disc spaces at T6-7 through T8-9. SOFT TISSUES: The paraspinal soft tissues are unremarkable. Small left pleural effusion. Trace pleural effusion on the right. VASCULATURE: Atherosclerosis of the coronary arteries. SPINAL CANAL AND FORAMINA: There is no high-grade osseous spinal canal stenosis. No CT evidence of large disc herniation. No high-grade osseous  foraminal stenosis. IMPRESSION: 1. No acute compression fracture. 2. Findings concerning for fractured osteophytes at T9-10 with lucency and gas extending into the anterior aspect of the T9-10 disc. Consider MRI for further evaluation. Electronically signed by: Donnice Mania MD 11/19/2023 01:28 PM EDT RP Workstation: [id number]     Signature  -   Lavada Stank M.D on 11/20/2023 at 9:30 AM   -  To page go to [email or web address]

## 2023-11-21 DIAGNOSIS — N179 Acute kidney failure, unspecified: Secondary | ICD-10-CM | POA: Diagnosis not present

## 2023-11-21 LAB — CBC WITH DIFFERENTIAL/PLATELET
Abs Immature Granulocytes: 0.06 K/uL (ref 0.00–0.07)
Basophils Absolute: 0 K/uL (ref 0.0–0.1)
Basophils Relative: 1 %
Eosinophils Absolute: 0.2 K/uL (ref 0.0–0.5)
Eosinophils Relative: 4 %
HCT: 28.1 % — ABNORMAL LOW (ref 39.0–52.0)
Hemoglobin: 8.7 g/dL — ABNORMAL LOW (ref 13.0–17.0)
Immature Granulocytes: 1 %
Lymphocytes Relative: 14 %
Lymphs Abs: 0.6 K/uL — ABNORMAL LOW (ref 0.7–4.0)
MCH: 26.7 pg (ref 26.0–34.0)
MCHC: 31 g/dL (ref 30.0–36.0)
MCV: 86.2 fL (ref 80.0–100.0)
Monocytes Absolute: 0.7 K/uL (ref 0.1–1.0)
Monocytes Relative: 17 %
Neutro Abs: 2.6 K/uL (ref 1.7–7.7)
Neutrophils Relative %: 63 %
Platelets: 191 K/uL (ref 150–400)
RBC: 3.26 MIL/uL — ABNORMAL LOW (ref 4.22–5.81)
RDW: 16.4 % — ABNORMAL HIGH (ref 11.5–15.5)
WBC: 4.2 K/uL (ref 4.0–10.5)
nRBC: 0 % (ref 0.0–0.2)

## 2023-11-21 LAB — PHOSPHORUS: Phosphorus: 2.9 mg/dL (ref 2.5–4.6)

## 2023-11-21 LAB — GLUCOSE, CAPILLARY
Glucose-Capillary: 128 mg/dL — ABNORMAL HIGH (ref 70–99)
Glucose-Capillary: 126 mg/dL — ABNORMAL HIGH (ref 70–99)
Glucose-Capillary: 132 mg/dL — ABNORMAL HIGH (ref 70–99)

## 2023-11-21 LAB — BASIC METABOLIC PANEL WITH GFR
Anion gap: 11 (ref 5–15)
BUN: 73 mg/dL — ABNORMAL HIGH (ref 8–23)
CO2: 18 mmol/L — ABNORMAL LOW (ref 22–32)
Calcium: 8.5 mg/dL — ABNORMAL LOW (ref 8.9–10.3)
Chloride: 111 mmol/L (ref 98–111)
Creatinine, Ser: 2.38 mg/dL — ABNORMAL HIGH (ref 0.61–1.24)
GFR, Estimated: 26 mL/min — ABNORMAL LOW (ref >60)
Glucose, Bld: 124 mg/dL — ABNORMAL HIGH (ref 70–99)
Potassium: 4 mmol/L (ref 3.5–5.1)
Sodium: 140 mmol/L (ref 135–145)

## 2023-11-21 LAB — MAGNESIUM: Magnesium: 1.8 mg/dL (ref 1.7–2.4)

## 2023-11-21 LAB — URIC ACID: Uric Acid, Serum: 7.8 mg/dL (ref 3.7–8.6)

## 2023-11-21 LAB — CK: Total CK: 118 U/L (ref 49–397)

## 2023-11-21 MED ORDER — HYDROCODONE-ACETAMINOPHEN 5-325 MG PO TABS
1.0000 | ORAL_TABLET | Freq: Four times a day (QID) | ORAL | Status: DC | PRN
  Administered 2023-11-21: 1 via ORAL
  Filled 2023-11-21: qty 1

## 2023-11-21 NOTE — Plan of Care (Signed)
 Problem: Education: Goal: Ability to describe self-care measures that may prevent or decrease complications (Diabetes Survival Skills Education) will improve 11/21/2023 1608 by Zackary Deronda HERO, RN Outcome: Progressing 11/21/2023 1608 by Zackary Deronda HERO, RN Outcome: Progressing Goal: Individualized Educational Video(s) 11/21/2023 1608 by Zackary Deronda HERO, RN Outcome: Progressing 11/21/2023 1608 by Zackary Deronda HERO, RN Outcome: Progressing   Problem: Coping: Goal: Ability to adjust to condition or change in health will improve 11/21/2023 1608 by Zackary Deronda HERO, RN Outcome: Progressing 11/21/2023 1608 by Zackary Deronda HERO, RN Outcome: Progressing   Problem: Fluid Volume: Goal: Ability to maintain a balanced intake and output will improve 11/21/2023 1608 by Zackary Deronda HERO, RN Outcome: Progressing 11/21/2023 1608 by Zackary Deronda HERO, RN Outcome: Progressing   Problem: Health Behavior/Discharge Planning: Goal: Ability to identify and utilize available resources and services will improve 11/21/2023 1608 by Zackary Deronda HERO, RN Outcome: Progressing 11/21/2023 1608 by Zackary Deronda HERO, RN Outcome: Progressing Goal: Ability to manage health-related needs will improve 11/21/2023 1608 by Zackary Deronda HERO, RN Outcome: Progressing 11/21/2023 1608 by Zackary Deronda HERO, RN Outcome: Progressing   Problem: Metabolic: Goal: Ability to maintain appropriate glucose levels will improve 11/21/2023 1608 by Zackary Deronda HERO, RN Outcome: Progressing 11/21/2023 1608 by Zackary Deronda HERO, RN Outcome: Progressing   Problem: Nutritional: Goal: Maintenance of adequate nutrition will improve 11/21/2023 1608 by Zackary Deronda HERO, RN Outcome: Progressing 11/21/2023 1608 by Zackary Deronda HERO, RN Outcome: Progressing Goal: Progress toward achieving an optimal weight will improve 11/21/2023 1608 by Zackary Deronda HERO, RN Outcome: Progressing 11/21/2023 1608 by Zackary Deronda HERO,  RN Outcome: Progressing   Problem: Skin Integrity: Goal: Risk for impaired skin integrity will decrease 11/21/2023 1608 by Zackary Deronda HERO, RN Outcome: Progressing 11/21/2023 1608 by Zackary Deronda HERO, RN Outcome: Progressing   Problem: Tissue Perfusion: Goal: Adequacy of tissue perfusion will improve 11/21/2023 1608 by Zackary Deronda HERO, RN Outcome: Progressing 11/21/2023 1608 by Zackary Deronda HERO, RN Outcome: Progressing   Problem: Education: Goal: Knowledge of General Education information will improve Description: Including pain rating scale, medication(s)/side effects and non-pharmacologic comfort measures 11/21/2023 1608 by Zackary Deronda HERO, RN Outcome: Progressing 11/21/2023 1608 by Zackary Deronda HERO, RN Outcome: Progressing   Problem: Health Behavior/Discharge Planning: Goal: Ability to manage health-related needs will improve 11/21/2023 1608 by Zackary Deronda HERO, RN Outcome: Progressing 11/21/2023 1608 by Zackary Deronda HERO, RN Outcome: Progressing   Problem: Clinical Measurements: Goal: Ability to maintain clinical measurements within normal limits will improve 11/21/2023 1608 by Zackary Deronda HERO, RN Outcome: Progressing 11/21/2023 1608 by Zackary Deronda HERO, RN Outcome: Progressing Goal: Will remain free from infection 11/21/2023 1608 by Zackary Deronda HERO, RN Outcome: Progressing 11/21/2023 1608 by Zackary Deronda HERO, RN Outcome: Progressing Goal: Diagnostic test results will improve 11/21/2023 1608 by Zackary Deronda HERO, RN Outcome: Progressing 11/21/2023 1608 by Zackary Deronda HERO, RN Outcome: Progressing Goal: Respiratory complications will improve 11/21/2023 1608 by Zackary Deronda HERO, RN Outcome: Progressing 11/21/2023 1608 by Zackary Deronda HERO, RN Outcome: Progressing Goal: Cardiovascular complication will be avoided 11/21/2023 1608 by Zackary Deronda HERO, RN Outcome: Progressing 11/21/2023 1608 by Zackary Deronda HERO, RN Outcome: Progressing    Problem: Activity: Goal: Risk for activity intolerance will decrease 11/21/2023 1608 by Zackary Deronda HERO, RN Outcome: Progressing 11/21/2023 1608 by Zackary Deronda HERO, RN Outcome: Progressing   Problem: Nutrition: Goal: Adequate nutrition will be maintained 11/21/2023 1608 by Zackary Deronda HERO, RN Outcome: Progressing 11/21/2023 1608 by Zackary Deronda HERO, RN Outcome: Progressing  Problem: Coping: Goal: Level of anxiety will decrease 11/21/2023 1608 by Zackary Deronda HERO, RN Outcome: Progressing 11/21/2023 1608 by Zackary Deronda HERO, RN Outcome: Progressing   Problem: Elimination: Goal: Will not experience complications related to bowel motility 11/21/2023 1608 by Zackary Deronda HERO, RN Outcome: Progressing 11/21/2023 1608 by Zackary Deronda HERO, RN Outcome: Progressing Goal: Will not experience complications related to urinary retention 11/21/2023 1608 by Zackary Deronda HERO, RN Outcome: Progressing 11/21/2023 1608 by Zackary Deronda HERO, RN Outcome: Progressing   Problem: Pain Managment: Goal: General experience of comfort will improve and/or be controlled 11/21/2023 1608 by Zackary Deronda HERO, RN Outcome: Progressing 11/21/2023 1608 by Zackary Deronda HERO, RN Outcome: Progressing   Problem: Safety: Goal: Ability to remain free from injury will improve 11/21/2023 1608 by Zackary Deronda HERO, RN Outcome: Progressing 11/21/2023 1608 by Zackary Deronda HERO, RN Outcome: Progressing   Problem: Skin Integrity: Goal: Risk for impaired skin integrity will decrease 11/21/2023 1608 by Zackary Deronda HERO, RN Outcome: Progressing 11/21/2023 1608 by Zackary Deronda HERO, RN Outcome: Progressing

## 2023-11-21 NOTE — Progress Notes (Signed)
 PROGRESS NOTE                                                                                                                                                                                                             Patient Demographics:    Peter Mann, is a 83 y.o. male, DOB - 07-23-1940, FMW:[id number]  Outpatient Primary MD for the patient is [name], MD    LOS - 3  Admit date - 11/17/2023    Chief Complaint  Patient presents with   Fall       Brief Narrative (HPI from H&P)   83 y.o. male with PMH significant for DM2, HTN, HLD, smoking, stroke, dementia, CKD, COPD, recurrent falls, prostate cancer s/p radiation. 9/9, patient was brought to the ED after he was found on the ground.\  Per report, family last saw him 4 days ago and was not able to hear back from him after that.  They sent out a wellness check and EMS found the patient on the floor confused and brought to the ED. in the ER he was diagnosed with dehydration, rhabdomyolysis, AKI and admitted.   Subjective:   Patient in bed, appears comfortable, denies any headache, no fever, no chest pain or pressure, no shortness of breath , no abdominal pain. No focal weakness.  Having some generalized aches and pains, chronic back pain also present.  No change.  No new weakness.   Assessment  & Plan :    AKI on CKD 2.  Baseline creatinine around 1.3, acute metabolic acidosis, fall related rhabdomyolysis. He had multiple falls in the past several months according to the daughter, MRI brain nonacute, MRI C-spine nonacute.  Continue to hydrate with IV fluids, AKI improving, PT OT, may require placement.  History of C3-C4 spinal stenosis.  PT OT and outpatient follow-up with neurology and neurosurgery.  Acute on chronic mid-low back pain.  Multiple falls over the last several months at home, CT T and L-spine noted with nonspecific fractures, case discussed with  neurosurgeon Dr. Reyes Budge in detail on 11/19/2023, no urgent surgery needed, TLSO brace, PT OT and pain control.  Encouraged patient to think about SNF due to ongoing multiple falls.  Outpatient follow-up with Dr. Budge postdischarge, no MRI needed.  Acute metabolic encephalopathy with underlying dementia.  MRI brain nonacute, minimize narcotics.  PT OT speech.  As needed Haldol.  Dysphagia.  Speech following.  Continue aspiration precautions and feeding assistance.    Hypertension.  On combination of Norvasc , beta-blocker and IV hydralazine  as needed.  Monitor and adjust.  Anemia of chronic disease, chronic vitamin B12 deficiency.  Placed on oral iron and B12 supplementation PCP to monitor.  History of stroke.  Continue on aspirin  and statin for secondary prevention.  DM type II.  SSI.  CBG (last 3)  Recent Labs    11/20/23 1143 11/20/23 1609 11/20/23 2113  GLUCAP 206* 176* 174*   Lab Results  Component Value Date   HGBA1C 5.9 (H) 11/18/2023         Condition - Fair  Family Communication  : Daughter updated in detail on 11/19/2023  Code Status : Full code  Consults  : None  PUD Prophylaxis :  ppi   Procedures  :     CT thoracic - 1. No acute compression fracture. 2. Findings concerning for fractured osteophytes at T9-10 with lucency and gas extending into the anterior aspect of the T9-10 disc.   CT lumbar spine.  1. No evidence of acute compression fracture or displaced fracture in the lumbar spine. 2. 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 3. Moderate-to-severe bilateral foraminal stenosis at L4-5 and L5-S1, increased from prior  MRI brain. 1. No acute intracranial abnormality. 2. 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 Other moderate for age cerebral white matter disease has progressed since that time. 3. 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 Spinal stenosis at C3-C4 appears chronic but may have increased from prior 2015, 2017 MRIs.  CT C-spine.  Nonacute      Disposition Plan  :    Status is: Inpatient  DVT Prophylaxis  :    heparin  injection 5,000 Units Start: 11/18/23 0600    Lab Results  Component Value Date   PLT 191 11/21/2023    Diet :  Diet Order             DIET DYS 3 Room service appropriate? No; Fluid consistency: Thin  Diet effective now                    Inpatient Medications  Scheduled Meds:  amLODipine   10 mg Oral Daily   aspirin   325 mg Oral Daily   brimonidine   1 drop Both Eyes TID   carvedilol   6.25 mg Oral BID WC   docusate sodium   200 mg Oral BID   ferrous sulfate   325 mg Oral BID WC   folic acid   1 mg Oral Daily   heparin   5,000 Units Subcutaneous Q8H   insulin  aspart  0-9 Units Subcutaneous TID WC   isosorbide  mononitrate  30 mg Oral Daily   leptospermum manuka honey  1 Application Topical Daily   pantoprazole   40 mg Oral Daily   polyethylene glycol  17 g Oral BID   rosuvastatin   20 mg Oral QHS   tamsulosin   0.4 mg Oral Daily   Continuous Infusions:  lactated ringers  50 mL/hr at 11/20/23 1844   PRN Meds:.hydrALAZINE , HYDROcodone -acetaminophen   Antibiotics  :    Anti-infectives (From admission, onward)    None         Objective:   Vitals:   11/20/23 1141 11/20/23 1600 11/20/23 2111 11/21/23 0006  BP: 121/69 126/72 122/64 133/71  Pulse: 73 70 71 73  Resp: 18 15 19 17   Temp: 98.2 F (36.8 C) 98.2 F (36.8 C) 98.9 F (37.2 C) (!) 97.4 F (36.3 C)  TempSrc: Oral Oral Oral Oral  SpO2: 91% (!) 74% 95% 96%  Weight:      Height:        Wt Readings from Last 3 Encounters:  11/17/23 71 kg  11/07/23 72.1 kg  10/05/23 73.6 kg     Intake/Output Summary (Last 24 hours) at 11/21/2023 0739 Last data filed at 11/21/2023 0006 Gross per 24 hour  Intake 406.77 ml  Output 1150 ml  Net -743.23 ml      Physical Exam  Awake Alert, No new F.N deficits, Normal affect Central.AT,PERRAL Supple Neck, No JVD,   Symmetrical Chest wall movement, Good air movement bilaterally, CTAB RRR,No Gallops,Rubs or new Murmurs,  +ve B.Sounds, Abd Soft, No tenderness,   No Cyanosis, Clubbing or edema        Data Review:    Recent Labs  Lab 11/17/23 2033 11/17/23 2104 11/18/23 0334 11/19/23 0317 11/20/23 0416 11/21/23 0415  WBC 7.2  --  6.9 5.8 4.2 4.2  HGB 12.3* 10.9* 10.4* 10.7* 8.9* 8.7*  HCT 39.6 32.0* 33.0* 33.6* 27.8* 28.1*  PLT 280  --  251 225 201 191  MCV 84.8  --  84.0 83.0 82.7 86.2  MCH 26.3  --  26.5 26.4 26.5 26.7  MCHC 31.1  --  31.5 31.8 32.0 31.0  RDW 16.5*  --  16.3* 16.5* 16.4* 16.4*  LYMPHSABS 0.3*  --  0.4* 0.5* 0.8 0.6*  MONOABS 0.6  --  0.7 0.8 0.7 0.7  EOSABS 0.0  --  0.0 0.1 0.1 0.2  BASOSABS 0.0  --  0.0 0.0 0.0 0.0    Recent Labs  Lab 11/17/23 2033 11/17/23 2104 11/17/23 2254 11/18/23 0334 11/18/23 0521 11/19/23 0317 11/20/23 0416 11/21/23 0415  NA 145 146*  --  145  --  145 143 140  K 4.2 3.4*  --  3.7  --  3.8 3.4* 4.0  CL 106 114*  --  112*  --  114* 109 111  CO2 14*  --   --  16*  --  18* 20* 18*  ANIONGAP 25*  --   --  17*  --  13 14 11   GLUCOSE 120* 107*  --  106*  --  86 114* 124*  BUN 88* 68*  --  87*  --  79* 81* 73*  CREATININE 3.95* 3.30*  --  3.63*  --  2.92* 2.73* 2.38*  AST 109*  --   --  72*  --   --   --   --   ALT 51*  --   --  37  --   --   --   --   ALKPHOS 54  --   --  42  --   --   --   --   BILITOT 1.4*  --   --  1.4*  --   --   --   --   ALBUMIN 3.4*  --   --  2.6*  --   --   --   --   LATICACIDVEN  --  1.1 0.7  --   --   --   --   --   TSH  --   --   --   --  1.269  --   --   --  HGBA1C  --   --   --  5.9*  --   --   --   --   AMMONIA  --   --   --   --  16  --   --   --   MG  --   --   --   --   --   --  2.0 1.8  PHOS  --   --   --   --   --   --  3.6 2.9  CALCIUM  9.2  --   --  8.5*  --  9.0 8.5* 8.5*      Recent  Labs  Lab 11/17/23 2033 11/17/23 2104 11/17/23 2254 11/18/23 0334 11/18/23 0521 11/19/23 0317 11/20/23 0416 11/21/23 0415  LATICACIDVEN  --  1.1 0.7  --   --   --   --   --   TSH  --   --   --   --  1.269  --   --   --   HGBA1C  --   --   --  5.9*  --   --   --   --   AMMONIA  --   --   --   --  16  --   --   --   MG  --   --   --   --   --   --  2.0 1.8  CALCIUM  9.2  --   --  8.5*  --  9.0 8.5* 8.5*    --------------------------------------------------------------------------------------------------------------- Lab Results  Component Value Date   CHOL 121 04/08/2022   HDL 27 (L) 04/08/2022   LDLCALC 63 04/08/2022   TRIG 267 (H) 04/08/2022   CHOLHDL 4.5 04/08/2022    Lab Results  Component Value Date   HGBA1C 5.9 (H) 11/18/2023   No results for input(s): TSH, T4TOTAL, FREET4, T3FREE, THYROIDAB in the last 72 hours.  No results for input(s): VITAMINB12, FOLATE, FERRITIN, TIBC, IRON, RETICCTPCT in the last 72 hours.  ------------------------------------------------------------------------------------------------------------------ Cardiac Enzymes No results for input(s): CKMB, TROPONINI, MYOGLOBIN in the last 168 hours.  Invalid input(s): CK  Micro Results No results found for this or any previous visit (from the past 240 hours).  Radiology Report CT LUMBAR SPINE WO CONTRAST Result Date: 11/19/2023 EXAM: CT OF THE LUMBAR SPINE WITHOUT CONTRAST 11/19/2023 12:49:09 PM TECHNIQUE: CT of the lumbar spine was performed without the administration of intravenous contrast. Multiplanar reformatted images are provided for review. Automated exposure control, iterative reconstruction, and/or weight based adjustment of the mA/kV was utilized to reduce the radiation dose to as low as reasonably achievable. COMPARISON: CT lumbar spine 05/27/2018 and same day CT thoracic spine. CLINICAL HISTORY: Low back pain, increased fracture risk. Chief complaints; Fall;  CT THORACIC SPINE WO CONTRAST; Compression fracture, thoracic; CT LUMBAR SPINE WO CONTRAST; Low back pain, increased fracture risk. FINDINGS: BONES AND ALIGNMENT: Similar grade 1 anterolisthesis of L4 on L5, alignment otherwise maintained. Similar appearance of bilateral pars defects at L3. Vertebral body heights are maintained. No evidence of acute compression fracture or displaced fracture in the lumbar spine. No suspicious lytic osseous lesion. DEGENERATIVE CHANGES: Degenerative endplate osteophytes, multiple levels endplate sclerosis most pronounced at L5-S1. Mild-to-moderate disc space narrowing at L5-S1 is similar to prior with associated vacuum disc. At T12-L1 there is no significant spinal canal or foraminal stenosis. At L1-L2 there are small posterior osteophytes and mild facet arthrosis. No significant spinal canal or foraminal stenosis. At L2-3  there is mild mineralization of the disc with small posterior osteophytes, additional prominent lateral osteophytes, particularly on the left, and mild facet arthrosis. There is no significant spinal canal or foraminal stenosis. Prominent lateral osteophytes with potential impingement upon the extraforaminal left L2 nerve root. At L3-4 there is a small disc bulge and posterior osteophytes, mild facet arthrosis, and mild spinal canal stenosis. Moderate bilateral foraminal stenosis, similar to prior. At L4-5, there is anterolisthesis, partial uncovering of the disc, diffuse disc bulge, moderate facet arthrosis, and thickening of the ligaments and flavum. There is moderate spinal canal stenosis, similar to prior. There is moderate-to-severe bilateral foraminal stenosis which appears slightly increased. At L5-S1 there is a diffuse disc bulge and posterior osteophytes, moderate-to-severe bilateral facet arthrosis. There is mild spinal canal stenosis, moderate-to-severe bilateral foraminal stenosis, slightly increased from prior. SOFT TISSUES: The paraspinal soft tissues  are unremarkable. 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 IMPRESSION: 1. No evidence of acute compression fracture or displaced fracture in the lumbar spine. 2. 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 3. Moderate-to-severe bilateral foraminal stenosis at L4-5 and L5-S1, increased from prior. Electronically signed by: Donnice Mania MD 11/19/2023 01:37 PM EDT RP Workstation: [id number]   CT THORACIC SPINE WO CONTRAST Result Date: 11/19/2023 EXAM: CT THORACIC SPINE WITHOUT CONTRAST 11/19/2023 12:49:09 PM TECHNIQUE: CT of the thoracic spine was performed without the administration of intravenous contrast. Multiplanar reformatted images are provided for review. Automated exposure control, iterative reconstruction, and/or weight based adjustment of the mA/kV was utilized to reduce the radiation dose to as low as reasonably achievable. COMPARISON: CT cervical spine 12/03/2018 and CT lumbar spine 05/27/2018. Thoracic spine radiographs 05/27/2018. CLINICAL HISTORY: Compression fracture, thoracic. Chief complaints; Fall; CT THORACIC SPINE WO CONTRAST; Compression fracture, thoracic; CT LUMBAR SPINE WO CONTRAST; Low back pain, increased fracture risk. 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 There is focal disruption of the osteophytes anteriorly at the T9-10 level with lucency and gas extending through the defect into the anterior aspect of the T9-10 disc. Findings are concerning for fractured osteophytes. The vertebral body heights are maintained without evidence of compression fracture. Mild dextrocurvature of the thoracic spine centered at T8-9. Thoracic kyphosis is maintained. DEGENERATIVE CHANGES: Degenerative endplate osteophytes and degenerative endplate changes at multiple levels.  There is no suspicious lytic osseous lesion. There is partial osseous fusion across the disc spaces at T6-7 through T8-9. SOFT TISSUES: The paraspinal soft tissues are unremarkable. Small left pleural effusion. Trace pleural effusion on the right. VASCULATURE: Atherosclerosis of the coronary arteries. SPINAL CANAL AND FORAMINA: There is no high-grade osseous spinal canal stenosis. No CT evidence of large disc herniation. No high-grade osseous foraminal stenosis. IMPRESSION: 1. No acute compression fracture. 2. Findings concerning for fractured osteophytes at T9-10 with lucency and gas extending into the anterior aspect of the T9-10 disc. Consider MRI for further evaluation. Electronically signed by: Donnice Mania MD 11/19/2023 01:28 PM EDT RP Workstation: [id number]     Signature  -   Lavada Stank M.D on 11/21/2023 at 7:39 AM   -  To page go to [email or web address]

## 2023-11-22 DIAGNOSIS — N179 Acute kidney failure, unspecified: Secondary | ICD-10-CM | POA: Diagnosis not present

## 2023-11-22 LAB — CBC WITH DIFFERENTIAL/PLATELET
Abs Immature Granulocytes: 0.05 K/uL (ref 0.00–0.07)
Basophils Absolute: 0 K/uL (ref 0.0–0.1)
Basophils Relative: 0 %
Eosinophils Absolute: 0.1 K/uL (ref 0.0–0.5)
Eosinophils Relative: 3 %
HCT: 28.2 % — ABNORMAL LOW (ref 39.0–52.0)
Hemoglobin: 9.1 g/dL — ABNORMAL LOW (ref 13.0–17.0)
Immature Granulocytes: 1 %
Lymphocytes Relative: 9 %
Lymphs Abs: 0.5 K/uL — ABNORMAL LOW (ref 0.7–4.0)
MCH: 26.8 pg (ref 26.0–34.0)
MCHC: 32.3 g/dL (ref 30.0–36.0)
MCV: 83.2 fL (ref 80.0–100.0)
Monocytes Absolute: 0.6 K/uL (ref 0.1–1.0)
Monocytes Relative: 12 %
Neutro Abs: 3.7 K/uL (ref 1.7–7.7)
Neutrophils Relative %: 75 %
Platelets: 219 K/uL (ref 150–400)
RBC: 3.39 MIL/uL — ABNORMAL LOW (ref 4.22–5.81)
RDW: 16.6 % — ABNORMAL HIGH (ref 11.5–15.5)
WBC: 5 K/uL (ref 4.0–10.5)
nRBC: 0 % (ref 0.0–0.2)

## 2023-11-22 LAB — BASIC METABOLIC PANEL WITH GFR
Anion gap: 10 (ref 5–15)
BUN: 56 mg/dL — ABNORMAL HIGH (ref 8–23)
CO2: 24 mmol/L (ref 22–32)
Calcium: 8.9 mg/dL (ref 8.9–10.3)
Chloride: 110 mmol/L (ref 98–111)
Creatinine, Ser: 2.11 mg/dL — ABNORMAL HIGH (ref 0.61–1.24)
GFR, Estimated: 30 mL/min — ABNORMAL LOW (ref >60)
Glucose, Bld: 125 mg/dL — ABNORMAL HIGH (ref 70–99)
Potassium: 4 mmol/L (ref 3.5–5.1)
Sodium: 144 mmol/L (ref 135–145)

## 2023-11-22 LAB — GLUCOSE, CAPILLARY
Glucose-Capillary: 115 mg/dL — ABNORMAL HIGH (ref 70–99)
Glucose-Capillary: 149 mg/dL — ABNORMAL HIGH (ref 70–99)
Glucose-Capillary: 164 mg/dL — ABNORMAL HIGH (ref 70–99)
Glucose-Capillary: 122 mg/dL — ABNORMAL HIGH (ref 70–99)

## 2023-11-22 LAB — PHOSPHORUS: Phosphorus: 3.4 mg/dL (ref 2.5–4.6)

## 2023-11-22 LAB — MAGNESIUM: Magnesium: 1.7 mg/dL (ref 1.7–2.4)

## 2023-11-22 LAB — CK: Total CK: 90 U/L (ref 49–397)

## 2023-11-22 NOTE — TOC Progression Note (Addendum)
 Transition of Care [facility]) - Progression Note    Patient Details  Name: Kinston Magnan MRN: [id number] Date of Birth: [birth year]  Transition of Care [facility]) CM/SW Contact  [address], [place], [place] Phone Number: 11/22/2023, 10:32 AM  Clinical Narrative:     Phone call to patient's daughter, Madelin Gander to discuss bed choice. Per Tammy, she has confirmed [facility] as his facility of choice.  Voicemail left with [facility] admissions coordinator at [place].  11:52am Spoke with Glenys from [place], she will review bed offer and call this social worker back.  Chase Arnall, LCSW Transition of Care    Expected Discharge Plan: Skilled Nursing Facility Barriers to Discharge: Continued Medical Work up, English as a second language teacher, SNF Pending bed offer               Expected Discharge Plan and Services In-house Referral: Clinical Social Work   Post Acute Care Choice: Skilled Nursing Facility Living arrangements for the past 2 months: Apartment                                       Social Drivers of Health (SDOH) Interventions SDOH Screenings   Food Insecurity: No Food Insecurity (11/18/2023)  Housing: Low Risk  (11/18/2023)  Transportation Needs: No Transportation Needs (11/18/2023)  Utilities: Not At Risk (11/18/2023)  Social Connections: Moderately Integrated (11/18/2023)  Tobacco Use: Medium Risk (11/18/2023)    Readmission Risk Interventions     No data to display

## 2023-11-22 NOTE — Plan of Care (Incomplete)

## 2023-11-22 NOTE — TOC Progression Note (Signed)
 Transition of Care [facility]) - Progression Note    Patient Details  Name: Peter Mann MRN: [id number] Date of Birth: 23-Feb-1941  Transition of Care [facility]) CM/SW Contact  [address] [place], [place] Phone Number: 11/22/2023, 3:13 PM  Clinical Narrative:    Return call from [place] at [facility] confirming bed offer selection. [employer] authorization started Ref# [id number]  Gavyn Zoss, LCSW Transition of Care     Expected Discharge Plan: Skilled Nursing Facility Barriers to Discharge: Continued Medical Work up, English as a second language teacher, SNF Pending bed offer               Expected Discharge Plan and Services In-house Referral: Clinical Social Work   Post Acute Care Choice: Skilled Nursing Facility Living arrangements for the past 2 months: Apartment                                       Social Drivers of Health (SDOH) Interventions SDOH Screenings   Food Insecurity: No Food Insecurity (11/18/2023)  Housing: Low Risk  (11/18/2023)  Transportation Needs: No Transportation Needs (11/18/2023)  Utilities: Not At Risk (11/18/2023)  Social Connections: Moderately Integrated (11/18/2023)  Tobacco Use: Medium Risk (11/18/2023)    Readmission Risk Interventions     No data to display

## 2023-11-22 NOTE — Progress Notes (Signed)
 PROGRESS NOTE                                                                                                                                                                                                             Patient Demographics:    Peter Mann, is a 83 y.o. male, DOB - 1940/08/09, FMW:[id number]  Outpatient Primary MD for the patient is [name], MD    LOS - 4  Admit date - 11/17/2023    Chief Complaint  Patient presents with   Fall       Brief Narrative (HPI from H&P)   83 y.o. male with PMH significant for DM2, HTN, HLD, smoking, stroke, dementia, CKD, COPD, recurrent falls, prostate cancer s/p radiation. 9/9, patient was brought to the ED after he was found on the ground.\  Per report, family last saw him 4 days ago and was not able to hear back from him after that.  They sent out a wellness check and EMS found the patient on the floor confused and brought to the ED. in the ER he was diagnosed with dehydration, rhabdomyolysis, AKI and admitted.   Subjective:   Patient in bed, appears comfortable, denies any headache, no fever, no chest pain or pressure, no shortness of breath , no abdominal pain. No new focal weakness.  Improved acute on chronic low back pain.   Assessment  & Plan :    AKI on CKD 2.  Baseline creatinine around 1.3, acute metabolic acidosis, fall related rhabdomyolysis. He had multiple falls in the past several months according to the daughter, MRI brain nonacute, MRI C-spine nonacute.  Continue to hydrate with IV fluids, AKI improving, PT OT, may require placement.  History of C3-C4 spinal stenosis.  PT OT and outpatient follow-up with neurology and neurosurgery.  Acute on chronic mid-low back pain.  Multiple falls over the last several months at home, CT T and L-spine noted with nonspecific fractures, case discussed with neurosurgeon Dr. Reyes Budge in detail on 11/19/2023, no  urgent surgery needed, TLSO brace, PT OT and pain control.  Encouraged patient to think about SNF due to ongoing multiple falls.  Outpatient follow-up with Dr. Budge postdischarge, no MRI needed.  Acute metabolic encephalopathy with underlying dementia.  MRI brain nonacute, minimize narcotics.  PT OT speech.  As needed Haldol.  Dysphagia.  Speech following.  Continue aspiration precautions and feeding assistance.    Hypertension.  On combination of Norvasc , beta-blocker and IV hydralazine  as needed.  Monitor and adjust.  Anemia of chronic disease, chronic vitamin B12 deficiency.  Placed on oral iron and B12 supplementation PCP to monitor.  History of stroke.  Continue on aspirin  and statin for secondary prevention.  DM type II.  SSI.  CBG (last 3)  Recent Labs    11/21/23 0747 11/21/23 1230 11/21/23 1621  GLUCAP 128* 126* 132*   Lab Results  Component Value Date   HGBA1C 5.9 (H) 11/18/2023         Condition - Fair  Family Communication  : Daughter updated in detail on 11/19/2023  Code Status : Full code  Consults  : None  PUD Prophylaxis :  ppi   Procedures  :     CT thoracic - 1. No acute compression fracture. 2. Findings concerning for fractured osteophytes at T9-10 with lucency and gas extending into the anterior aspect of the T9-10 disc.   CT lumbar spine.  1. No evidence of acute compression fracture or displaced fracture in the lumbar spine. 2. 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 3. Moderate-to-severe bilateral foraminal stenosis at L4-5 and L5-S1, increased from prior  MRI brain. 1. No acute intracranial abnormality. 2. 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 Other moderate for age cerebral white matter disease has progressed since that time. 3. 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  Spinal stenosis at C3-C4 appears chronic but may have increased from prior 2015, 2017 MRIs.  CT C-spine.  Nonacute      Disposition Plan  :    Status is: Inpatient  DVT Prophylaxis  :    heparin  injection 5,000 Units Start: 11/18/23 0600    Lab Results  Component Value Date   PLT 219 11/22/2023    Diet :  Diet Order             DIET DYS 3 Room service appropriate? No; Fluid consistency: Thin  Diet effective now                    Inpatient Medications  Scheduled Meds:  amLODipine   10 mg Oral Daily   aspirin   325 mg Oral Daily   brimonidine   1 drop Both Eyes TID   carvedilol   6.25 mg Oral BID WC   docusate sodium   200 mg Oral BID   ferrous sulfate   325 mg Oral BID WC   folic acid   1 mg Oral Daily   heparin   5,000 Units Subcutaneous Q8H   insulin  aspart  0-9 Units Subcutaneous TID WC   isosorbide  mononitrate  30 mg Oral Daily   leptospermum manuka honey  1 Application Topical Daily   pantoprazole   40 mg Oral Daily   polyethylene glycol  17 g Oral BID   rosuvastatin   20 mg Oral QHS   tamsulosin   0.4 mg Oral Daily   Continuous Infusions:   PRN Meds:.hydrALAZINE , HYDROcodone -acetaminophen   Antibiotics  :    Anti-infectives (From admission, onward)    None         Objective:   Vitals:   11/20/23 2111 11/21/23 0006 11/21/23 0750 11/21/23 1950  BP: 122/64 133/71 (!) 168/85 (!) 146/73  Pulse: 71 73 74   Resp: 19 17 15  Temp: 98.9 F (37.2 C) (!) 97.4 F (36.3 C) 98 F (36.7 C) 98.6 F (37 C)  TempSrc: Oral Oral Oral Oral  SpO2: 95% 96% 96%   Weight:      Height:        Wt Readings from Last 3 Encounters:  11/17/23 71 kg  11/07/23 72.1 kg  10/05/23 73.6 kg     Intake/Output Summary (Last 24 hours) at 11/22/2023 0738 Last data filed at 11/22/2023 0553 Gross per 24 hour  Intake 958.75 ml  Output 1000 ml  Net -41.25 ml     Physical Exam  Awake Alert, No new F.N deficits, Normal affect [place].AT,PERRAL Supple Neck, No JVD,    Symmetrical Chest wall movement, Good air movement bilaterally, CTAB RRR,No Gallops,Rubs or new Murmurs,  +ve B.Sounds, Abd Soft, No tenderness,   No Cyanosis, Clubbing or edema        Data Review:    Recent Labs  Lab 11/18/23 0334 11/19/23 0317 11/20/23 0416 11/21/23 0415 11/22/23 0357  WBC 6.9 5.8 4.2 4.2 5.0  HGB 10.4* 10.7* 8.9* 8.7* 9.1*  HCT 33.0* 33.6* 27.8* 28.1* 28.2*  PLT 251 225 201 191 219  MCV 84.0 83.0 82.7 86.2 83.2  MCH 26.5 26.4 26.5 26.7 26.8  MCHC 31.5 31.8 32.0 31.0 32.3  RDW 16.3* 16.5* 16.4* 16.4* 16.6*  LYMPHSABS 0.4* 0.5* 0.8 0.6* 0.5*  MONOABS 0.7 0.8 0.7 0.7 0.6  EOSABS 0.0 0.1 0.1 0.2 0.1  BASOSABS 0.0 0.0 0.0 0.0 0.0    Recent Labs  Lab 11/17/23 2033 11/17/23 2104 11/17/23 2254 11/18/23 0334 11/18/23 0521 11/19/23 0317 11/20/23 0416 11/21/23 0415 11/22/23 0357  NA 145 146*  --  145  --  145 143 140 144  K 4.2 3.4*  --  3.7  --  3.8 3.4* 4.0 4.0  CL 106 114*  --  112*  --  114* 109 111 110  CO2 14*  --   --  16*  --  18* 20* 18* 24  ANIONGAP 25*  --   --  17*  --  13 14 11 10   GLUCOSE 120* 107*  --  106*  --  86 114* 124* 125*  BUN 88* 68*  --  87*  --  79* 81* 73* 56*  CREATININE 3.95* 3.30*  --  3.63*  --  2.92* 2.73* 2.38* 2.11*  AST 109*  --   --  72*  --   --   --   --   --   ALT 51*  --   --  37  --   --   --   --   --   ALKPHOS 54  --   --  42  --   --   --   --   --   BILITOT 1.4*  --   --  1.4*  --   --   --   --   --   ALBUMIN 3.4*  --   --  2.6*  --   --   --   --   --   LATICACIDVEN  --  1.1 0.7  --   --   --   --   --   --   TSH  --   --   --   --  1.269  --   --   --   --   HGBA1C  --   --   --  5.9*  --   --   --   --   --  AMMONIA  --   --   --   --  16  --   --   --   --   MG  --   --   --   --   --   --  2.0 1.8 1.7  PHOS  --   --   --   --   --   --  3.6 2.9 3.4  CALCIUM  9.2  --   --  8.5*  --  9.0 8.5* 8.5* 8.9      Recent Labs  Lab 11/17/23 2104 11/17/23 2254 11/18/23 0334 11/18/23 0521  11/19/23 0317 11/20/23 0416 11/21/23 0415 11/22/23 0357  LATICACIDVEN 1.1 0.7  --   --   --   --   --   --   TSH  --   --   --  1.269  --   --   --   --   HGBA1C  --   --  5.9*  --   --   --   --   --   AMMONIA  --   --   --  16  --   --   --   --   MG  --   --   --   --   --  2.0 1.8 1.7  CALCIUM   --   --  8.5*  --  9.0 8.5* 8.5* 8.9    --------------------------------------------------------------------------------------------------------------- Lab Results  Component Value Date   CHOL 121 04/08/2022   HDL 27 (L) 04/08/2022   LDLCALC 63 04/08/2022   TRIG 267 (H) 04/08/2022   CHOLHDL 4.5 04/08/2022    Lab Results  Component Value Date   HGBA1C 5.9 (H) 11/18/2023   No results for input(s): TSH, T4TOTAL, FREET4, T3FREE, THYROIDAB in the last 72 hours.  No results for input(s): VITAMINB12, FOLATE, FERRITIN, TIBC, IRON, RETICCTPCT in the last 72 hours.  ------------------------------------------------------------------------------------------------------------------ Cardiac Enzymes No results for input(s): CKMB, TROPONINI, MYOGLOBIN in the last 168 hours.  Invalid input(s): CK  Micro Results No results found for this or any previous visit (from the past 240 hours).  Radiology Report No results found.    Signature  -   Lavada Stank M.D on 11/22/2023 at 7:38 AM   -  To page go to [email or web address]

## 2023-11-23 DIAGNOSIS — N179 Acute kidney failure, unspecified: Secondary | ICD-10-CM | POA: Diagnosis not present

## 2023-11-23 LAB — BASIC METABOLIC PANEL WITH GFR
Anion gap: 13 (ref 5–15)
BUN: 48 mg/dL — ABNORMAL HIGH (ref 8–23)
CO2: 23 mmol/L (ref 22–32)
Calcium: 9.1 mg/dL (ref 8.9–10.3)
Chloride: 110 mmol/L (ref 98–111)
Creatinine, Ser: 2.15 mg/dL — ABNORMAL HIGH (ref 0.61–1.24)
GFR, Estimated: 30 mL/min — ABNORMAL LOW (ref >60)
Glucose, Bld: 108 mg/dL — ABNORMAL HIGH (ref 70–99)
Potassium: 4.3 mmol/L (ref 3.5–5.1)
Sodium: 146 mmol/L — ABNORMAL HIGH (ref 135–145)

## 2023-11-23 LAB — CBC WITH DIFFERENTIAL/PLATELET
Abs Immature Granulocytes: 0.07 K/uL (ref 0.00–0.07)
Basophils Absolute: 0 K/uL (ref 0.0–0.1)
Basophils Relative: 0 %
Eosinophils Absolute: 0.1 K/uL (ref 0.0–0.5)
Eosinophils Relative: 2 %
HCT: 29.3 % — ABNORMAL LOW (ref 39.0–52.0)
Hemoglobin: 9.3 g/dL — ABNORMAL LOW (ref 13.0–17.0)
Immature Granulocytes: 1 %
Lymphocytes Relative: 8 %
Lymphs Abs: 0.6 K/uL — ABNORMAL LOW (ref 0.7–4.0)
MCH: 26.8 pg (ref 26.0–34.0)
MCHC: 31.7 g/dL (ref 30.0–36.0)
MCV: 84.4 fL (ref 80.0–100.0)
Monocytes Absolute: 0.7 K/uL (ref 0.1–1.0)
Monocytes Relative: 9 %
Neutro Abs: 6.2 K/uL (ref 1.7–7.7)
Neutrophils Relative %: 80 %
Platelets: 256 K/uL (ref 150–400)
RBC: 3.47 MIL/uL — ABNORMAL LOW (ref 4.22–5.81)
RDW: 16.5 % — ABNORMAL HIGH (ref 11.5–15.5)
WBC: 7.8 K/uL (ref 4.0–10.5)
nRBC: 0 % (ref 0.0–0.2)

## 2023-11-23 LAB — PHOSPHORUS: Phosphorus: 3.6 mg/dL (ref 2.5–4.6)

## 2023-11-23 LAB — MAGNESIUM: Magnesium: 1.6 mg/dL — ABNORMAL LOW (ref 1.7–2.4)

## 2023-11-23 LAB — CK: Total CK: 90 U/L (ref 49–397)

## 2023-11-23 LAB — GLUCOSE, CAPILLARY
Glucose-Capillary: 100 mg/dL — ABNORMAL HIGH (ref 70–99)
Glucose-Capillary: 189 mg/dL — ABNORMAL HIGH (ref 70–99)
Glucose-Capillary: 187 mg/dL — ABNORMAL HIGH (ref 70–99)
Glucose-Capillary: 126 mg/dL — ABNORMAL HIGH (ref 70–99)

## 2023-11-23 MED ORDER — SODIUM CHLORIDE 0.45 % IV SOLN: INTRAVENOUS | Status: AC

## 2023-11-23 MED ORDER — MAGNESIUM SULFATE 4 GM/100ML IV SOLN
4.0000 g | Freq: Once | INTRAVENOUS | Status: AC
Start: 2023-11-23 — End: 2023-11-23
  Administered 2023-11-23: 4 g via INTRAVENOUS
  Filled 2023-11-23: qty 100

## 2023-11-23 MED ORDER — HYDRALAZINE HCL 50 MG PO TABS
50.0000 mg | ORAL_TABLET | Freq: Three times a day (TID) | ORAL | Status: DC
  Administered 2023-11-23 – 2023-11-27 (×10): 50 mg via ORAL
  Filled 2023-11-23 (×11): qty 1

## 2023-11-23 NOTE — TOC Progression Note (Addendum)
 Transition of Care [facility]) - Progression Note    Patient Details  Name: Peter Mann MRN: [id number] Date of Birth: 1941/01/15  [facility] CM/SW Contact  Inocente GORMAN Kindle, LCSW Phone Number: 11/23/2023, 2:12 PM  Clinical Narrative:    Insurance approval received for [facility], Ref# [id number], Auth ID#  [id number], effective 11/23/2023-11/27/2023.  CSW updated [facility] on likely discharge tomorrow. CSW provided update to daughter.    Expected Discharge Plan: Skilled Nursing Facility Barriers to Discharge: Continued Medical Work up               Expected Discharge Plan and Services In-house Referral: Clinical Social Work   Post Acute Care Choice: Skilled Nursing Facility Living arrangements for the past 2 months: Apartment                                       Social Drivers of Health (SDOH) Interventions SDOH Screenings   Food Insecurity: No Food Insecurity (11/18/2023)  Housing: Low Risk  (11/18/2023)  Transportation Needs: No Transportation Needs (11/18/2023)  Utilities: Not At Risk (11/18/2023)  Social Connections: Moderately Integrated (11/18/2023)  Tobacco Use: Medium Risk (11/18/2023)    Readmission Risk Interventions     No data to display

## 2023-11-23 NOTE — Plan of Care (Signed)

## 2023-11-23 NOTE — Care Management Important Message (Signed)
 Important Message  Patient Details  Name: Peter Mann MRN: [id number] Date of Birth: 05/12/1940   Important Message Given:  Yes - Medicare IM     Claretta Deed 11/23/2023, 4:05 PM

## 2023-11-23 NOTE — TOC Progression Note (Signed)
 Transition of Care [facility]) - Progression Note    Patient Details  Name: Jayquon Theiler MRN: [id number] Date of Birth: 10/17/1940  [facility]) CM/SW Contact  Inocente GORMAN Kindle, LCSW Phone Number: 11/23/2023, 9:38 AM  Clinical Narrative:    9:38 AM-Insurance approval still pending for [facility]. CSW updated insurance portal with this CSW's contact info.    Expected Discharge Plan: Skilled Nursing Facility Barriers to Discharge: Continued Medical Work up, English as a second language teacher, SNF Pending bed offer               Expected Discharge Plan and Services In-house Referral: Clinical Social Work   Post Acute Care Choice: Skilled Nursing Facility Living arrangements for the past 2 months: Apartment                                       Social Drivers of Health (SDOH) Interventions SDOH Screenings   Food Insecurity: No Food Insecurity (11/18/2023)  Housing: Low Risk  (11/18/2023)  Transportation Needs: No Transportation Needs (11/18/2023)  Utilities: Not At Risk (11/18/2023)  Social Connections: Moderately Integrated (11/18/2023)  Tobacco Use: Medium Risk (11/18/2023)    Readmission Risk Interventions     No data to display

## 2023-11-23 NOTE — Progress Notes (Addendum)
 Physical Therapy Treatment Patient Details Name: Peter Mann MRN: [id number] DOB: [birth year] Today's Date: 11/23/2023   History of Present Illness The pt is an 83 yo male presenting 9/9 after being found on ground by family. Admitted for management of rhabdomyolysis and possible UTI. CT T and L-spine noted with nonspecific fractures. PMH includes: HTN, HLD, CVA, DM II, tobacco use, BPH, and dementia.    PT Comments  Pt admitted with above diagnosis. Pt was able to ambulate a short distance needing mod assist to 2 to stand and mod assist to walk a short distance with a RW and need for chair follow.  Pt noted to cough with drinking liquids and messaged MD regarding this and MD reordered ST to assess pt.  Pt continues to need post acute rehab < 3 hours day.  Pt currently with functional limitations due to the deficits listed below (see PT Problem List). Pt will benefit from acute skilled PT to increase their independence and safety with mobility to allow discharge.       If plan is discharge home, recommend the following: Two people to help with walking and/or transfers;A lot of help with bathing/dressing/bathroom;Assistance with cooking/housework;Direct supervision/assist for medications management;Direct supervision/assist for financial management;Assistance with feeding;Assist for transportation;Help with stairs or ramp for entrance;Supervision due to cognitive status   Can travel by private vehicle     No  Equipment Recommendations  Rolling walker (2 wheels);Wheelchair (measurements PT);Wheelchair cushion (measurements PT);BSC/3in1;Hospital bed    Recommendations for Other Services OT consult;Speech consult     Precautions / Restrictions Precautions Precautions: Fall;Back Precaution Booklet Issued: No Recall of Precautions/Restrictions: Impaired Required Braces or Orthoses: Spinal Brace Spinal Brace: Thoracolumbosacral orthotic;Applied in sitting position Restrictions Weight Bearing  Restrictions Per Provider Order: No     Mobility  Bed Mobility Overal bed mobility: Needs Assistance Bed Mobility: Supine to Sit, Sidelying to Sit Rolling: Used rails, Max assist Sidelying to sit: Max assist, HOB elevated, Used rails       General bed mobility comments: Pt needed max assist and cues to manage each leg off EOB with assistance and to push up and  ascend trunk and pivot body to R EOB from elevated HOB with use of pad as well.    Transfers Overall transfer level: Needs assistance Equipment used: Rolling walker (2 wheels), 1 person hand held assist Transfers: Sit to/from Stand, Bed to chair/wheelchair/BSC Sit to Stand: Mod assist, +2 physical assistance, From elevated surface           General transfer comment: Pt needed cues for hand placement to stand as well as mod assist to power up from EOB.  Pt needed cues to use RW and stand tall once up as he wanted to maintain flexed posture. ModA for balance throughout    Ambulation/Gait Ambulation/Gait assistance: Mod assist, +2 safety/equipment Gait Distance (Feet): 16 Feet Assistive device: Rolling walker (2 wheels) Gait Pattern/deviations: Step-to pattern, Decreased step length - left, Decreased step length - right, Decreased stride length, Decreased dorsiflexion - right, Decreased dorsiflexion - left, Decreased weight shift to right, Decreased weight shift to left, Knee flexed in stance - right, Knee flexed in stance - left, Knees buckling, Trunk flexed, Shuffle Gait velocity: reduced Gait velocity interpretation: <1.31 ft/sec, indicative of household ambulator   General Gait Details: Pt takes slow, small, shuffling steps while maintaining a flexed trunk and flexed knees. Tactile and verbal cues provided to extend trunk and legs to improve posture and stability/safety, but poor carryover noted. Tactile cues provided  for weight shifting along with VCs to clear and advance feet to progress gait.  ModA for balance  throughout.   Followed with chair for safety and therefore incr distance.   Stairs             Wheelchair Mobility     Tilt Bed    Modified Rankin (Stroke Patients Only)       Balance Overall balance assessment: Needs assistance Sitting-balance support: No upper extremity supported, Feet supported Sitting balance-Leahy Scale: Fair Sitting balance - Comments: Able to sit EOB statically with CGA Postural control: Posterior lean Standing balance support: Bilateral upper extremity supported, During functional activity, Reliant on assistive device for balance Standing balance-Leahy Scale: Poor Standing balance comment: reliant on UE support and mod A                            Communication Communication Communication: Impaired Factors Affecting Communication: Hearing impaired;Reduced clarity of speech  Cognition Arousal: Alert Behavior During Therapy: WFL for tasks assessed/performed   PT - Cognitive impairments: Awareness, Sequencing, Problem solving, Safety/Judgement Difficult to assess due to: Impaired communication (garbled speech at times) Orientation impairments: Place, Time, Situation                   PT - Cognition Comments: Pt slow to process cues and with decreased awareness of his weakness and risk for falls. Needs multi-modal cues to sequence mobility and maintain safety Following commands: Impaired Following commands impaired: Only follows one step commands consistently, Follows one step commands with increased time    Cueing Cueing Techniques: Verbal cues, Gestural cues, Tactile cues  Exercises General Exercises - Lower Extremity Ankle Circles/Pumps: AROM, Both, 10 reps, Seated Long Arc Quad: AROM, 10 reps, Seated, Both    General Comments General comments (skin integrity, edema, etc.): VSS      Pertinent Vitals/Pain Pain Assessment Pain Assessment: Faces Faces Pain Scale: Hurts even more Breathing: normal Negative  Vocalization: none Facial Expression: smiling or inexpressive Body Language: relaxed Consolability: no need to console PAINAD Score: 0 Pain Location: back Pain Descriptors / Indicators: Discomfort, Grimacing, Guarding, Moaning Pain Intervention(s): Limited activity within patient's tolerance, Monitored during session, Repositioned    Home Living                          Prior Function            PT Goals (current goals can now be found in the care plan section) Acute Rehab PT Goals Patient Stated Goal: return home Progress towards PT goals: Progressing toward goals    Frequency    Min 2X/week      PT Plan      Co-evaluation              AM-PAC PT 6 Clicks Mobility   Outcome Measure  Help needed turning from your back to your side while in a flat bed without using bedrails?: A Lot Help needed moving from lying on your back to sitting on the side of a flat bed without using bedrails?: A Lot Help needed moving to and from a bed to a chair (including a wheelchair)?: A Lot Help needed standing up from a chair using your arms (e.g., wheelchair or bedside chair)?: Total Help needed to walk in hospital room?: Total Help needed climbing 3-5 steps with a railing? : Total 6 Click Score: 9    End of Session  Equipment Utilized During Treatment: Gait belt;Back brace Activity Tolerance: Patient tolerated treatment well;Patient limited by fatigue Patient left: in chair;with call bell/phone within reach;with chair alarm set Nurse Communication: Mobility status;Precautions PT Visit Diagnosis: Unsteadiness on feet (R26.81);Other abnormalities of gait and mobility (R26.89);Repeated falls (R29.6);Muscle weakness (generalized) (M62.81);History of falling (Z91.81);Pain Pain - Right/Left:  (back) Pain - part of body:  (back)     Time: 8662-8592 PT Time Calculation (min) (ACUTE ONLY): 30 min  Charges:    $Gait Training: 8-22 mins $Therapeutic Exercise: 8-22  mins PT General Charges $$ ACUTE PT VISIT: 1 Visit                     Melisha Eggleton M,PT Acute Rehab Services [phone]    Stephane JULIANNA Bevel 11/23/2023, 4:18 PM

## 2023-11-23 NOTE — Progress Notes (Addendum)
 PROGRESS NOTE                                                                                                                                                                                                             Patient Demographics:    Peter Mann, is a 83 y.o. male, DOB - 05/23/1940, FMW:[id number]  Outpatient Primary MD for the patient is [name], MD    LOS - 5  Admit date - 11/17/2023    Chief Complaint  Patient presents with   Fall       Brief Narrative (HPI from H&P)   83 y.o. male with PMH significant for DM2, HTN, HLD, smoking, stroke, dementia, CKD, COPD, recurrent falls, prostate cancer s/p radiation. 9/9, patient was brought to the ED after he was found on the ground.\  Per report, family last saw him 4 days ago and was not able to hear back from him after that.  They sent out a wellness check and EMS found the patient on the floor confused and brought to the ED. in the ER he was diagnosed with dehydration, rhabdomyolysis, AKI and admitted.   Subjective:   Patient in bed, appears comfortable, denies any headache, no fever, no chest pain or pressure, no shortness of breath , no abdominal pain. No focal weakness.  Acute on chronic mid back pain improving.   Assessment  & Plan :    AKI on CKD 2.  Baseline creatinine around 1.3, acute metabolic acidosis, fall related rhabdomyolysis. He had multiple falls in the past several months according to the daughter, MRI brain nonacute, MRI C-spine nonacute.  Continue to hydrate with IV fluids, AKI improving, PT OT, may require placement.  History of C3-C4 spinal stenosis.  PT OT and outpatient follow-up with neurology and neurosurgery.  Acute on chronic mid-low back pain.  Multiple falls over the last several months at home, CT T and L-spine noted with nonspecific fractures, case discussed with neurosurgeon Dr. Reyes Budge in detail on 11/19/2023, no urgent  surgery needed, TLSO brace, PT OT and pain control.  Encouraged patient to think about SNF due to ongoing multiple falls.  Outpatient follow-up with Dr. Budge postdischarge, no MRI needed.  Acute metabolic encephalopathy with underlying dementia.  MRI brain nonacute, minimize narcotics.  PT OT speech.  As needed Haldol.  Dysphagia.  Speech  following.  Continue aspiration precautions and feeding assistance.    Hypertension.  Placed on combination of Norvasc , beta-blocker and Imdur , adding scheduled hydralazine  along with IV hydralazine  as needed.  Monitor and adjust.  Anemia of chronic disease, chronic vitamin B12 deficiency.  Placed on oral iron and B12 supplementation PCP to monitor.  Dehydration with hypernatremia.  Gentle IV fluids and monitor.    History of stroke.  Continue on aspirin  and statin for secondary prevention.  DM type II.  SSI.  CBG (last 3)  Recent Labs    11/22/23 1542 11/22/23 2138 11/23/23 0749  GLUCAP 164* 122* 100*   Lab Results  Component Value Date   HGBA1C 5.9 (H) 11/18/2023         Condition - Fair  Family Communication  : Daughter updated in detail on 11/19/2023  Code Status : Full code  Consults  : None  PUD Prophylaxis :  ppi   Procedures  :     CT thoracic - 1. No acute compression fracture. 2. Findings concerning for fractured osteophytes at T9-10 with lucency and gas extending into the anterior aspect of the T9-10 disc.   CT lumbar spine.  1. No evidence of acute compression fracture or displaced fracture in the lumbar spine. 2. 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 3. Moderate-to-severe bilateral foraminal stenosis at L4-5 and L5-S1, increased from prior  MRI brain. 1. No acute intracranial abnormality. 2. 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 Other moderate for age cerebral white matter  disease has progressed since that time. 3. 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 Spinal stenosis at C3-C4 appears chronic but may have increased from prior 2015, 2017 MRIs.  CT C-spine.  Nonacute      Disposition Plan  :    Status is: Inpatient  DVT Prophylaxis  :    heparin  injection 5,000 Units Start: 11/18/23 0600    Lab Results  Component Value Date   PLT 256 11/23/2023    Diet :  Diet Order             DIET DYS 3 Room service appropriate? No; Fluid consistency: Thin  Diet effective now                    Inpatient Medications  Scheduled Meds:  amLODipine   10 mg Oral Daily   aspirin   325 mg Oral Daily   brimonidine   1 drop Both Eyes TID   carvedilol   6.25 mg Oral BID WC   docusate sodium   200 mg Oral BID   ferrous sulfate   325 mg Oral BID WC   folic acid   1 mg Oral Daily   heparin   5,000 Units Subcutaneous Q8H   hydrALAZINE   50 mg Oral Q8H   insulin  aspart  0-9 Units Subcutaneous TID WC   isosorbide  mononitrate  30 mg Oral Daily   leptospermum manuka honey  1 Application Topical Daily   pantoprazole   40 mg Oral Daily   polyethylene glycol  17 g Oral BID   rosuvastatin   20 mg Oral QHS   tamsulosin   0.4 mg Oral Daily   Continuous Infusions:  sodium chloride  100 mL/hr at 11/23/23 0620    PRN Meds:.hydrALAZINE , HYDROcodone -acetaminophen   Antibiotics  :    Anti-infectives (From admission, onward)    None  Objective:   Vitals:   11/22/23 1100 11/22/23 1500 11/22/23 1951 11/23/23 0751  BP: (!) 143/74 127/68  (!) 166/79  Pulse:    69  Resp:    12  Temp: 98.2 F (36.8 C) 98.5 F (36.9 C) 97.9 F (36.6 C) (!) 97.5 F (36.4 C)  TempSrc: Oral Oral Oral Oral  SpO2:    96%  Weight:      Height:        Wt Readings from Last 3 Encounters:  11/17/23 71 kg  11/07/23 72.1 kg  10/05/23 73.6 kg     Intake/Output Summary (Last 24 hours) at 11/23/2023 0944 Last data filed at 11/23/2023 0400 Gross per  24 hour  Intake --  Output 950 ml  Net -950 ml     Physical Exam  Awake Alert, No new F.N deficits, Normal affect [place].AT,PERRAL Supple Neck, No JVD,   Symmetrical Chest wall movement, Good air movement bilaterally, CTAB RRR,No Gallops,Rubs or new Murmurs,  +ve B.Sounds, Abd Soft, No tenderness,   No Cyanosis, Clubbing or edema        Data Review:    Recent Labs  Lab 11/19/23 0317 11/20/23 0416 11/21/23 0415 11/22/23 0357 11/23/23 0433  WBC 5.8 4.2 4.2 5.0 7.8  HGB 10.7* 8.9* 8.7* 9.1* 9.3*  HCT 33.6* 27.8* 28.1* 28.2* 29.3*  PLT 225 201 191 219 256  MCV 83.0 82.7 86.2 83.2 84.4  MCH 26.4 26.5 26.7 26.8 26.8  MCHC 31.8 32.0 31.0 32.3 31.7  RDW 16.5* 16.4* 16.4* 16.6* 16.5*  LYMPHSABS 0.5* 0.8 0.6* 0.5* 0.6*  MONOABS 0.8 0.7 0.7 0.6 0.7  EOSABS 0.1 0.1 0.2 0.1 0.1  BASOSABS 0.0 0.0 0.0 0.0 0.0    Recent Labs  Lab 11/17/23 2033 11/17/23 2104 11/17/23 2254 11/18/23 0334 11/18/23 0521 11/19/23 0317 11/20/23 0416 11/21/23 0415 11/22/23 0357 11/23/23 0433  NA 145 146*  --  145  --  145 143 140 144 146*  K 4.2 3.4*  --  3.7  --  3.8 3.4* 4.0 4.0 4.3  CL 106 114*  --  112*  --  114* 109 111 110 110  CO2 14*  --   --  16*  --  18* 20* 18* 24 23  ANIONGAP 25*  --   --  17*  --  13 14 11 10 13   GLUCOSE 120* 107*  --  106*  --  86 114* 124* 125* 108*  BUN 88* 68*  --  87*  --  79* 81* 73* 56* 48*  CREATININE 3.95* 3.30*  --  3.63*  --  2.92* 2.73* 2.38* 2.11* 2.15*  AST 109*  --   --  72*  --   --   --   --   --   --   ALT 51*  --   --  37  --   --   --   --   --   --   ALKPHOS 54  --   --  42  --   --   --   --   --   --   BILITOT 1.4*  --   --  1.4*  --   --   --   --   --   --   ALBUMIN 3.4*  --   --  2.6*  --   --   --   --   --   --   LATICACIDVEN  --  1.1 0.7  --   --   --   --   --   --   --  TSH  --   --   --   --  1.269  --   --   --   --   --   HGBA1C  --   --   --  5.9*  --   --   --   --   --   --   AMMONIA  --   --   --   --  16  --   --   --   --    --   MG  --   --   --   --   --   --  2.0 1.8 1.7 1.6*  PHOS  --   --   --   --   --   --  3.6 2.9 3.4 3.6  CALCIUM  9.2  --   --  8.5*  --  9.0 8.5* 8.5* 8.9 9.1      Recent Labs  Lab 11/17/23 2104 11/17/23 2254 11/18/23 0334 11/18/23 0521 11/19/23 0317 11/20/23 0416 11/21/23 0415 11/22/23 0357 11/23/23 0433  LATICACIDVEN 1.1 0.7  --   --   --   --   --   --   --   TSH  --   --   --  1.269  --   --   --   --   --   HGBA1C  --   --  5.9*  --   --   --   --   --   --   AMMONIA  --   --   --  16  --   --   --   --   --   MG  --   --   --   --   --  2.0 1.8 1.7 1.6*  CALCIUM   --   --  8.5*  --  9.0 8.5* 8.5* 8.9 9.1    --------------------------------------------------------------------------------------------------------------- Lab Results  Component Value Date   CHOL 121 04/08/2022   HDL 27 (L) 04/08/2022   LDLCALC 63 04/08/2022   TRIG 267 (H) 04/08/2022   CHOLHDL 4.5 04/08/2022    Lab Results  Component Value Date   HGBA1C 5.9 (H) 11/18/2023   No results for input(s): TSH, T4TOTAL, FREET4, T3FREE, THYROIDAB in the last 72 hours.  No results for input(s): VITAMINB12, FOLATE, FERRITIN, TIBC, IRON, RETICCTPCT in the last 72 hours.  ------------------------------------------------------------------------------------------------------------------ Cardiac Enzymes No results for input(s): CKMB, TROPONINI, MYOGLOBIN in the last 168 hours.  Invalid input(s): CK  Micro Results No results found for this or any previous visit (from the past 240 hours).  Radiology Report No results found.    Signature  -   Lavada Stank M.D on 11/23/2023 at 9:44 AM   -  To page go to [email or web address]

## 2023-11-24 ENCOUNTER — Inpatient Hospital Stay (HOSPITAL_COMMUNITY)

## 2023-11-24 DIAGNOSIS — N179 Acute kidney failure, unspecified: Secondary | ICD-10-CM | POA: Diagnosis not present

## 2023-11-24 LAB — URINALYSIS, ROUTINE W REFLEX MICROSCOPIC: RBC / HPF: >50 RBC/hpf (ref 0–5)

## 2023-11-24 LAB — URINALYSIS, MICROSCOPIC (REFLEX)
RBC / HPF: >50 RBC/hpf (ref 0–5)
WBC, UA: >50 WBC/hpf (ref 0–5)

## 2023-11-24 LAB — GLUCOSE, CAPILLARY
Glucose-Capillary: 108 mg/dL — ABNORMAL HIGH (ref 70–99)
Glucose-Capillary: 135 mg/dL — ABNORMAL HIGH (ref 70–99)
Glucose-Capillary: 162 mg/dL — ABNORMAL HIGH (ref 70–99)
Glucose-Capillary: 116 mg/dL — ABNORMAL HIGH (ref 70–99)

## 2023-11-24 LAB — PROTIME-INR
INR: 1.3 — ABNORMAL HIGH (ref 0.8–1.2)
Prothrombin Time: 16.9 s — ABNORMAL HIGH (ref 11.4–15.2)

## 2023-11-24 LAB — ABO/RH: ABO/RH(D): O POS

## 2023-11-24 LAB — BASIC METABOLIC PANEL WITH GFR
Anion gap: 11 (ref 5–15)
BUN: 46 mg/dL — ABNORMAL HIGH (ref 8–23)
CO2: 22 mmol/L (ref 22–32)
Calcium: 8.8 mg/dL — ABNORMAL LOW (ref 8.9–10.3)
Chloride: 107 mmol/L (ref 98–111)
Creatinine, Ser: 2.05 mg/dL — ABNORMAL HIGH (ref 0.61–1.24)
GFR, Estimated: 31 mL/min — ABNORMAL LOW (ref >60)
Glucose, Bld: 101 mg/dL — ABNORMAL HIGH (ref 70–99)
Potassium: 3.9 mmol/L (ref 3.5–5.1)
Sodium: 140 mmol/L (ref 135–145)

## 2023-11-24 LAB — CBC
HCT: 28.2 % — ABNORMAL LOW (ref 39.0–52.0)
Hemoglobin: 8.8 g/dL — ABNORMAL LOW (ref 13.0–17.0)
MCH: 26.5 pg (ref 26.0–34.0)
MCHC: 31.2 g/dL (ref 30.0–36.0)
MCV: 84.9 fL (ref 80.0–100.0)
Platelets: 253 K/uL (ref 150–400)
RBC: 3.32 MIL/uL — ABNORMAL LOW (ref 4.22–5.81)
RDW: 16.5 % — ABNORMAL HIGH (ref 11.5–15.5)
WBC: 5.3 K/uL (ref 4.0–10.5)
nRBC: 0 % (ref 0.0–0.2)

## 2023-11-24 LAB — MAGNESIUM: Magnesium: 2.3 mg/dL (ref 1.7–2.4)

## 2023-11-24 LAB — TYPE AND SCREEN
ABO/RH(D): O POS
Antibody Screen: NEGATIVE

## 2023-11-24 MED ORDER — ASPIRIN 81 MG PO TBEC
81.0000 mg | DELAYED_RELEASE_TABLET | Freq: Every day | ORAL | Status: DC
  Administered 2023-11-25 – 2023-11-27 (×3): 81 mg via ORAL
  Filled 2023-11-24 (×3): qty 1

## 2023-11-24 MED ORDER — CHLORHEXIDINE GLUCONATE CLOTH 2 % EX PADS
6.0000 | MEDICATED_PAD | Freq: Every day | CUTANEOUS | Status: DC
Start: 2023-11-25 — End: 2023-11-27
  Administered 2023-11-25 – 2023-11-27 (×3): 6 via TOPICAL

## 2023-11-24 NOTE — TOC Progression Note (Signed)
 Transition of Care [facility]) - Progression Note    Patient Details  Name: Romari Gasparro MRN: [id number] Date of Birth: 05-31-1940  [facility] CM/SW Contact  Inocente GORMAN Kindle, LCSW Phone Number: 11/24/2023, 8:50 AM  Clinical Narrative:    CSW provided update to SNF.    Expected Discharge Plan: Skilled Nursing Facility Barriers to Discharge: Continued Medical Work up               Expected Discharge Plan and Services In-house Referral: Clinical Social Work   Post Acute Care Choice: Skilled Nursing Facility Living arrangements for the past 2 months: Apartment                                       Social Drivers of Health (SDOH) Interventions SDOH Screenings   Food Insecurity: No Food Insecurity (11/18/2023)  Housing: Low Risk  (11/18/2023)  Transportation Needs: No Transportation Needs (11/18/2023)  Utilities: Not At Risk (11/18/2023)  Social Connections: Moderately Integrated (11/18/2023)  Tobacco Use: Medium Risk (11/18/2023)    Readmission Risk Interventions     No data to display

## 2023-11-24 NOTE — Plan of Care (Signed)
 Pt off the floor during attempted consultation. 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  Frank red blood in pure wick canister. Ideally would have CT Hematuria, but AKI prevents contrast load. High dose aspirin  held. Place 3-way foley, hand irrigate, and initiate CBI if clot obstruction is suspected. Will attempt to see again tomorrow.

## 2023-11-24 NOTE — Progress Notes (Signed)
 Patient called to say he wants to defecate, this nurse immediately noticed the urine canister filled with bloody urine amounting to 800 mls of note, there was about 300-400 ml of tea colored urine mixed with the bloody urine in the canister. Patient has no evident trauma on penis and was just on external catheter, notified Dr. Sundil and changed all connections and external catheter in order to collect urine specimen.

## 2023-11-24 NOTE — Consult Note (Addendum)
 H&P Physician requesting consult: Lavada Stank  CC: Gross hematuria HPI: 11/24/2023 83 year old male with a history of prostate cancer status post IMRT in 2007 for Gleason 8 prostate cancer.  Patient was admitted to the hospital after being found on the ground.  He was found confused.  He was noted to have acute renal insufficiency, dehydration, acute metabolic encephalopathy with underlying dementia.  Last night, he developed sudden onset of gross hematuria.  He denies any significant abdominal pain or discomfort right now but continues to have gross hematuria.  Renal ultrasound was performed that revealed a large amount of heterogeneous material in the bladder consistent with debris or possible hemorrhage.  He had no hydronephrosis or obvious stones.  No obvious masses in the kidney.  Past Medical History:  Diagnosis Date   At low risk for fall    Cancer (HCC)    prostrate ca, radiation treatments   Chest pain    COPD (chronic obstructive pulmonary disease) (HCC)    Dementia (HCC)    Diabetes mellitus without complication (HCC)    Dizziness    History of tobacco abuse    Hyperlipidemia    Hypertension    Stroke [facility])    Subconjunctival hemorrhage of left eye 07/18/2019   The condition of the involved eye is that of a subconjunctival hemorrhage.  These are often spontaneous but are also often at or near the site of low location of an injection should to be placed into the eye.  In the absence of direct blunt or severe trauma these will typically resolve on their own spontaneously and have no impact on the vision.    These are very similar to having a bruise in your   Past Surgical History:  Procedure Laterality Date   CHOLECYSTECTOMY N/A 01/30/2014   Procedure: LAPAROSCOPIC CHOLECYSTECTOMY;  Surgeon: [name], MD;  Location: [facility] OR;  Service: General;  Laterality: N/A;    Home Medications:  Medications Prior to Admission  Medication Sig Dispense Refill Last Dose/Taking    acetaminophen  (TYLENOL ) 500 MG tablet Take 1,000 mg by mouth every 6 (six) hours as needed for mild pain or headache.      ALPHAGAN  P 0.1 % SOLN Place 1 drop into both eyes 2 (two) times daily.      amLODipine  (NORVASC ) 10 MG tablet Take 10 mg by mouth daily.      aspirin  325 MG tablet Take 1 tablet (325 mg total) by mouth daily. 30 tablet 0    CEQUA  0.09 % SOLN Place 1 drop into both eyes 2 (two) times daily.      cyanocobalamin (VITAMIN B12) 1000 MCG tablet Take 1 tablet (1,000 mcg total) by mouth daily. 90 tablet 3    dexlansoprazole (DEXILANT) 60 MG capsule Take 60 mg by mouth daily.      diclofenac sodium (VOLTAREN) 1 % GEL Apply 2 g topically as needed (pain).       isosorbide  mononitrate (IMDUR ) 30 MG 24 hr tablet Take 1 tablet (30 mg total) by mouth daily. 30 tablet 0    JARDIANCE 25 MG TABS tablet Take 25 mg by mouth daily.      LINZESS  290 MCG CAPS capsule Take 290 mcg by mouth daily.      lubiprostone (AMITIZA) 24 MCG capsule Take 24 mcg by mouth 2 (two) times daily with a meal.      meclizine  (ANTIVERT ) 25 MG tablet Take 25 mg by mouth 2 (two) times daily.      metFORMIN  (GLUCOPHAGE )  500 MG tablet Take 500 mg by mouth 2 (two) times daily.      metoprolol  tartrate (LOPRESSOR ) 25 MG tablet TAKE 1 TABLET BY MOUTH TWICE A DAY 120 tablet 0    polyethylene glycol (MIRALAX ) 17 g packet Take 17 g by mouth daily as needed for moderate constipation or mild constipation (if no bowel movement in 2 days). 14 each 0    rosuvastatin  (CRESTOR ) 20 MG tablet Take 20 mg by mouth daily.      saxagliptin HCl (ONGLYZA) 5 MG TABS tablet Take 5 mg by mouth daily.      senna-docusate (SENOKOT-S) 8.6-50 MG tablet Take 1 tablet by mouth at bedtime. 30 tablet 1    tamsulosin  (FLOMAX ) 0.4 MG CAPS capsule Take 0.4 mg by mouth daily.       Allergies: No Known Allergies  Family History  Problem Relation Age of Onset   Cancer Brother    Cancer Sister    Social History:  reports that he quit smoking about 5  years ago. His smoking use included cigarettes. He has never used smokeless tobacco. He reports that he does not drink alcohol  and does not use drugs.  ROS: A complete review of systems was performed.  All systems are negative except for pertinent findings as noted. ROS   Physical Exam:  Vital signs in last 24 hours: Temp:  [97.8 F (36.6 C)-98.3 F (36.8 C)] 98.3 F (36.8 C) (09/16 1600) Pulse Rate:  [65-90] 67 (09/16 1600) Resp:  [14-16] 14 (09/16 1600) BP: (116-178)/(56-80) 116/56 (09/16 1600) SpO2:  [92 %-96 %] 94 % (09/16 0744) General:  Alert but confused, poor historian,, No acute distress HEENT: Normocephalic, atraumatic Neck: No JVD or lymphadenopathy Cardiovascular: Regular rate and rhythm Lungs: Regular rate and effort Abdomen: Soft, nontender, nondistended, no abdominal masses Back: No CVA tenderness Genitourinary: Circumcised phallus. Extremities: No edema Neurologic: Grossly intact  Laboratory Data:  Results for orders placed or performed during the hospital encounter of 11/17/23 (from the past 24 hours)  Glucose, capillary     Status: Abnormal   Collection Time: 11/23/23  9:20 PM  Result Value Ref Range   Glucose-Capillary 126 (H) 70 - 99 mg/dL  Magnesium      Status: None   Collection Time: 11/24/23  4:22 AM  Result Value Ref Range   Magnesium  2.3 1.7 - 2.4 mg/dL  Basic metabolic panel with GFR     Status: Abnormal   Collection Time: 11/24/23  4:22 AM  Result Value Ref Range   Sodium 140 135 - 145 mmol/L   Potassium 3.9 3.5 - 5.1 mmol/L   Chloride 107 98 - 111 mmol/L   CO2 22 22 - 32 mmol/L   Glucose, Bld 101 (H) 70 - 99 mg/dL   BUN 46 (H) 8 - 23 mg/dL   Creatinine, Ser 7.94 (H) 0.61 - 1.24 mg/dL   Calcium  8.8 (L) 8.9 - 10.3 mg/dL   GFR, Estimated 31 (L) >60 mL/min   Anion gap 11 5 - 15  CBC     Status: Abnormal   Collection Time: 11/24/23  4:45 AM  Result Value Ref Range   WBC 5.3 4.0 - 10.5 K/uL   RBC 3.32 (L) 4.22 - 5.81 MIL/uL   Hemoglobin  8.8 (L) 13.0 - 17.0 g/dL   HCT 71.7 (L) 60.9 - 47.9 %   MCV 84.9 80.0 - 100.0 fL   MCH 26.5 26.0 - 34.0 pg   MCHC 31.2 30.0 - 36.0 g/dL   RDW  16.5 (H) 11.5 - 15.5 %   Platelets 253 150 - 400 K/uL   nRBC 0.0 0.0 - 0.2 %  ABO/Rh     Status: None   Collection Time: 11/24/23  4:45 AM  Result Value Ref Range   ABO/RH(D)      O POS Performed at [facility], [address] [place], [place] [address]   Urinalysis, Routine w reflex microscopic -Urine, Clean Catch     Status: Abnormal   Collection Time: 11/24/23  5:54 AM  Result Value Ref Range   Color, Urine RED (A) YELLOW   APPearance TURBID (A) CLEAR   Specific Gravity, Urine  1.005 - 1.030    TEST NOT REPORTED DUE TO COLOR INTERFERENCE OF URINE PIGMENT   pH  5.0 - 8.0    TEST NOT REPORTED DUE TO COLOR INTERFERENCE OF URINE PIGMENT   Glucose, UA (A) NEGATIVE mg/dL    TEST NOT REPORTED DUE TO COLOR INTERFERENCE OF URINE PIGMENT   Hgb urine dipstick (A) NEGATIVE    TEST NOT REPORTED DUE TO COLOR INTERFERENCE OF URINE PIGMENT   Bilirubin Urine (A) NEGATIVE    TEST NOT REPORTED DUE TO COLOR INTERFERENCE OF URINE PIGMENT   Ketones, ur (A) NEGATIVE mg/dL    TEST NOT REPORTED DUE TO COLOR INTERFERENCE OF URINE PIGMENT   Protein, ur (A) NEGATIVE mg/dL    TEST NOT REPORTED DUE TO COLOR INTERFERENCE OF URINE PIGMENT   Nitrite (A) NEGATIVE    TEST NOT REPORTED DUE TO COLOR INTERFERENCE OF URINE PIGMENT   Leukocytes,Ua (A) NEGATIVE    TEST NOT REPORTED DUE TO COLOR INTERFERENCE OF URINE PIGMENT   RBC / HPF >50 0 - 5 RBC/hpf   WBC, UA 0-5 0 - 5 WBC/hpf   Bacteria, UA RARE (A) NONE SEEN   Squamous Epithelial / HPF 0-5 0 - 5 /HPF  Glucose, capillary     Status: Abnormal   Collection Time: 11/24/23  7:44 AM  Result Value Ref Range   Glucose-Capillary 108 (H) 70 - 99 mg/dL  Type and screen     Status: None   Collection Time: 11/24/23 10:47 AM  Result Value Ref Range   ABO/RH(D) O POS    Antibody Screen NEG    Sample Expiration       11/27/2023,2359 Performed at [facility], [address], [place], [place] [address]   Protime-INR     Status: Abnormal   Collection Time: 11/24/23 10:50 AM  Result Value Ref Range   Prothrombin Time 16.9 (H) 11.4 - 15.2 seconds   INR 1.3 (H) 0.8 - 1.2  Urinalysis, Routine w reflex microscopic -Urine, Clean Catch     Status: Abnormal   Collection Time: 11/24/23 10:54 AM  Result Value Ref Range   Color, Urine RED (A) YELLOW   APPearance TURBID (A) CLEAR   Specific Gravity, Urine  1.005 - 1.030    TEST NOT REPORTED DUE TO COLOR INTERFERENCE OF URINE PIGMENT   pH  5.0 - 8.0    TEST NOT REPORTED DUE TO COLOR INTERFERENCE OF URINE PIGMENT   Glucose, UA (A) NEGATIVE mg/dL    TEST NOT REPORTED DUE TO COLOR INTERFERENCE OF URINE PIGMENT   Hgb urine dipstick (A) NEGATIVE    TEST NOT REPORTED DUE TO COLOR INTERFERENCE OF URINE PIGMENT   Bilirubin Urine (A) NEGATIVE    TEST NOT REPORTED DUE TO COLOR INTERFERENCE OF URINE PIGMENT   Ketones, ur (A) NEGATIVE mg/dL  TEST NOT REPORTED DUE TO COLOR INTERFERENCE OF URINE PIGMENT   Protein, ur (A) NEGATIVE mg/dL    TEST NOT REPORTED DUE TO COLOR INTERFERENCE OF URINE PIGMENT   Nitrite (A) NEGATIVE    TEST NOT REPORTED DUE TO COLOR INTERFERENCE OF URINE PIGMENT   Leukocytes,Ua (A) NEGATIVE    TEST NOT REPORTED DUE TO COLOR INTERFERENCE OF URINE PIGMENT  Urinalysis, Microscopic (reflex)     Status: Abnormal   Collection Time: 11/24/23 10:54 AM  Result Value Ref Range   RBC / HPF >50 0 - 5 RBC/hpf   WBC, UA >50 0 - 5 WBC/hpf   Bacteria, UA RARE (A) NONE SEEN   Squamous Epithelial / HPF 0-5 0 - 5 /HPF  Glucose, capillary     Status: Abnormal   Collection Time: 11/24/23 12:59 PM  Result Value Ref Range   Glucose-Capillary 135 (H) 70 - 99 mg/dL  Glucose, capillary     Status: Abnormal   Collection Time: 11/24/23  4:02 PM  Result Value Ref Range   Glucose-Capillary 162 (H) 70 - 99 mg/dL   No results found for this or any previous visit  (from the past 240 hours). Creatinine: Recent Labs    11/18/23 0334 11/19/23 0317 11/20/23 0416 11/21/23 0415 11/22/23 0357 11/23/23 0433 11/24/23 0422  CREATININE 3.63* 2.92* 2.73* 2.38* 2.11* 2.15* 2.05*   Procedure: I instilled lidocaine  gel into the bladder.  I prepped and draped the patient.  A 24 French three-way Foley catheter was advanced into the bladder followed by irrigation of moderate amount of clot with normal saline.  I irrigated until clear and the effluent was clear without active bleeding so I capped the third port of the catheter and did not start CBI.  Impression/Assessment:  Gross hematuria History of prostate cancer status post radiation  Plan:  Continue Foley catheter.  Nursing may irrigate as needed for recurrent bleeding.  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  Okay to discontinue Foley in the next couple days prior to discharge.  Sherwood JONETTA Edison, III 11/24/2023, 6:11 PM

## 2023-11-24 NOTE — Evaluation (Signed)
 Clinical/Bedside Swallow Evaluation Patient Details  Name: Peter Mann MRN: [id number] Date of Birth: [birth year]  Today's Date: 11/24/2023 Time: SLP Start Time (ACUTE ONLY): 1321 SLP Stop Time (ACUTE ONLY): 1332 SLP Time Calculation (min) (ACUTE ONLY): 11 min  Past Medical History:  Past Medical History:  Diagnosis Date   At low risk for fall    Cancer (HCC)    prostrate ca, radiation treatments   Chest pain    COPD (chronic obstructive pulmonary disease) (HCC)    Dementia (HCC)    Diabetes mellitus without complication (HCC)    Dizziness    History of tobacco abuse    Hyperlipidemia    Hypertension    Stroke [facility])    Subconjunctival hemorrhage of left eye 07/18/2019   The condition of the involved eye is that of a subconjunctival hemorrhage.  These are often spontaneous but are also often at or near the site of low location of an injection should to be placed into the eye.  In the absence of direct blunt or severe trauma these will typically resolve on their own spontaneously and have no impact on the vision.    These are very similar to having a bruise in your   Past Surgical History:  Past Surgical History:  Procedure Laterality Date   CHOLECYSTECTOMY N/A 01/30/2014   Procedure: LAPAROSCOPIC CHOLECYSTECTOMY;  Surgeon: [name], MD;  Location: [facility] OR;  Service: General;  Laterality: N/A;   HPI:  The pt is an 83 yo male presenting 9/9 after being found on ground by family. Admitted for management of rhabdomyolysis and possible UTI. PMH includes: HTN, HLD, CVA, DM II, tobacco use, BPH, and dementia. BSE 9/9 impulsive, no s/s aspiration. F/u 9/12 no s/s aspiration, rec Dys 3/thin and signed off. Coughing with thin during PT session 9/15 and swallow re-eval ordered. 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 Bulky anterior  osteophytes. Multilevel  degenerative disc space narrowing. Fusion of  the facets at multiple levels with facet degenerative change.    Assessment / Plan / Recommendation  Clinical Impression  Pt familiar to this SLP and exhibiting signs of pharyngeal dysphagia not  present with prior eval/sessions. He coughed immediately with majority of sips thin regardless of volume ( single sip or sequential). He continues to be impulsive with feeding and needed cues to swallow before taking additional bites. Mastication mildly prolonged and min residue that he cleared with liquid wash. Pt has diagnosed cervical spine abnormalities (refer to HPI) and would benefit from instrumental testing for impact on safety/efficiency and use of compensatory strategies as warranted. MBS planned for tomorrow. Recommend continue Dys 3/thin, 2 pills at a time (RN stated he took 8 at one time with thin and stated one felt stuck but eventually cleared). SLP Visit Diagnosis: Dysphagia, unspecified (R13.10)    Aspiration Risk  Mild aspiration risk;Moderate aspiration risk    Diet Recommendation Dysphagia 3 (Mech soft);Thin liquid    Liquid Administration via: Cup;Straw Medication Administration: Whole meds with liquid (several at a time vs 8 as RN reported) Supervision: Full supervision/cueing for compensatory strategies Compensations: Slow rate;Small sips/bites Postural Changes: Seated upright at 90 degrees    Other  Recommendations Oral Care Recommendations: Oral care BID     Assistance Recommended at Discharge    Functional Status Assessment    Frequency and Duration            Prognosis  Swallow Study   General Date of Onset: 11/17/23 HPI: The pt is an 83 yo male presenting 9/9 after being found on ground by family. Admitted for management of rhabdomyolysis and possible UTI. PMH includes: HTN, HLD, CVA, DM II, tobacco use, BPH, and dementia. BSE 9/9 impulsive, no s/s aspiration. F/u 9/12 no s/s aspiration, rec Dys 3/thin and  signed off. Coughing with thin during PT session 9/15 and swallow re-eval ordered. 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 Bulky anterior  osteophytes. Multilevel degenerative disc space narrowing. Fusion of  the facets at multiple levels with facet degenerative change. Type of Study: Bedside Swallow Evaluation Previous Swallow Assessment:  (see HPI) Diet Prior to this Study: Dysphagia 3 (mechanical soft);Thin liquids (Level 0) Temperature Spikes Noted: No Respiratory Status: Room air History of Recent Intubation: No Behavior/Cognition: Alert;Cooperative;Pleasant mood Oral Cavity Assessment: Within Functional Limits Oral Care Completed by SLP: No Oral Cavity - Dentition: Adequate natural dentition (missing 3-4) Vision: Functional for self-feeding Self-Feeding Abilities: Able to feed self Patient Positioning: Upright in bed Baseline Vocal Quality: Normal Volitional Cough: Strong Volitional Swallow: Able to elicit    Oral/Motor/Sensory Function Overall Oral Motor/Sensory Function: Within functional limits   Ice Chips Ice chips: Not tested   Thin Liquid Thin Liquid: Impaired Presentation: Straw Pharyngeal  Phase Impairments: Cough - Immediate;Cough - Delayed    Nectar Thick Nectar Thick Liquid: Not tested   Honey Thick Honey Thick Liquid: Not tested   Puree Puree: Not tested   Solid     Solid: Impaired Oral Phase Functional Implications: Oral residue;Prolonged oral transit (mildly prolonged) Pharyngeal Phase Impairments: Cough - Delayed      Peter Mann 11/24/2023,2:03 PM

## 2023-11-24 NOTE — Plan of Care (Addendum)
 RN reported t300-400 mls of the total 800 mls of urine I drained was tea colored earlier but it got mixed with the bloody one. will change his purewick to collect UA once he pees again   RN reported that patient has 800 mL of urine output and out of that 300 to 400 mL is tea colored and rest of blood mixed with urine.  Patient does not any history of hematuria in the past.  Patient has history of prostate cancer s/p radiation.  Checking UA.  Discontinuing IV heparin  for DVT prophylaxis. Checking CBC stat.  I believe patient has dark-colored urine/hematuria in the setting of rhabdomyolysis.  Checking UA.  Josanne Boerema, MD [facility] 11/24/2023, 4:45 AM    Update, CBC showing stable H&H.  UA turbid appearance RBC above 50.  It was significant anuria unable to check for UTI.

## 2023-11-24 NOTE — Plan of Care (Signed)
  Problem: Coping: Goal: Ability to adjust to condition or change in health will improve Outcome: Not Progressing   Problem: Nutritional: Goal: Maintenance of adequate nutrition will improve Outcome: Not Progressing   Problem: Skin Integrity: Goal: Risk for impaired skin integrity will decrease Outcome: Not Progressing   Problem: Tissue Perfusion: Goal: Adequacy of tissue perfusion will improve Outcome: Not Progressing   Problem: Safety: Goal: Ability to remain free from injury will improve Outcome: Not Progressing

## 2023-11-24 NOTE — Progress Notes (Signed)
 OT Cancellation Note  Patient Details Name: Peter Mann MRN: [id number] DOB: 1940/07/12   Cancelled Treatment:    Reason Eval/Treat Not Completed: Patient at procedure or test/ unavailable. OT will follow up next available time  Jacques Karna Loose 11/24/2023, 11:59 AM

## 2023-11-24 NOTE — Progress Notes (Addendum)
 PROGRESS NOTE                                                                                                                                                                                                             Patient Demographics:    Peter Mann, is a 83 y.o. male, DOB - 14-Jun-1940, FMW:[id number]  Outpatient Primary MD for the patient is [name], MD    LOS - 6  Admit date - 11/17/2023    Chief Complaint  Patient presents with   Fall       Brief Narrative (HPI from H&P)   83 y.o. male with PMH significant for DM2, HTN, HLD, smoking, stroke, dementia, CKD, COPD, recurrent falls, prostate cancer s/p radiation. 9/9, patient was brought to the ED after he was found on the ground.\  Per report, family last saw him 4 days ago and was not able to hear back from him after that.  They sent out a wellness check and EMS found the patient on the floor confused and brought to the ED. in the ER he was diagnosed with dehydration, rhabdomyolysis, AKI and admitted.   Subjective:   Patient in bed, appears comfortable, denies any headache, no fever, no chest pain or pressure, no shortness of breath , no abdominal pain. No focal weakness.  Claims that since last night his urine has turned red.   Assessment  & Plan :    AKI on CKD 2.  Baseline creatinine around 1.3, acute metabolic acidosis, fall related rhabdomyolysis. He had multiple falls in the past several months according to the daughter, MRI brain nonacute, MRI C-spine nonacute.  Continue to hydrate with IV fluids, AKI improving, PT OT, may require placement.  History of C3-C4 spinal stenosis.  PT OT and outpatient follow-up with neurology and neurosurgery.  Acute on chronic mid-low back pain.  Multiple falls over the last several months at home, CT T and L-spine noted with nonspecific fractures, case discussed with neurosurgeon Dr. Reyes Budge in detail on  11/19/2023, no urgent surgery needed, TLSO brace, PT OT and pain control.  Encouraged patient to think about SNF due to ongoing multiple falls.  Outpatient follow-up with Dr. Budge postdischarge, no MRI needed.  Sudden onset of hematuria on night of 11/23/2023.  No previous history of same, not on high-dose anticoagulants, was on high-dose  aspirin  which has been cut down on 11/24/2023, no Foley or trauma, UA noted will get urology input.  Monitor INR, type screen and monitor CBC.  Will check renal ultrasound.  Acute metabolic encephalopathy with underlying dementia.  MRI brain nonacute, minimize narcotics.  PT OT speech.  As needed Haldol.  Dysphagia.  Speech following.  Continue aspiration precautions and feeding assistance.    Hypertension.  Placed on combination of Norvasc , beta-blocker and Imdur , adding scheduled hydralazine  along with IV hydralazine  as needed.  Monitor and adjust.  Anemia of chronic disease, chronic vitamin B12 deficiency.  Placed on oral iron and B12 supplementation PCP to monitor.  Dehydration with hypernatremia.  Gentle IV fluids and monitor.    History of stroke.  Continue on aspirin  but will cut down from 324 to 81 mg on 11/24/2023 due to hematuria, continue statin for secondary prevention.  DM type II.  SSI.  CBG (last 3)  Recent Labs    11/23/23 1612 11/23/23 2120 11/24/23 0744  GLUCAP 187* 126* 108*   Lab Results  Component Value Date   HGBA1C 5.9 (H) 11/18/2023         Condition - Fair  Family Communication  : Daughter updated in detail on 11/19/2023  Code Status : Full code  Consults  : Urology on 11/24/2023  PUD Prophylaxis :  ppi   Procedures  :     CT thoracic - 1. No acute compression fracture. 2. Findings concerning for fractured osteophytes at T9-10 with lucency and gas extending into the anterior aspect of the T9-10 disc.   CT lumbar spine.  1. No evidence of acute compression fracture or displaced fracture in the lumbar spine. 2.  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 3. Moderate-to-severe bilateral foraminal stenosis at L4-5 and L5-S1, increased from prior  MRI brain. 1. No acute intracranial abnormality. 2. 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 Other moderate for age cerebral white matter disease has progressed since that time. 3. 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 Spinal stenosis at C3-C4 appears chronic but may have increased from prior 2015, 2017 MRIs.  CT C-spine.  Nonacute      Disposition Plan  :    Status is: Inpatient  DVT Prophylaxis  :    Place and maintain sequential compression device Start: 11/24/23 1009    Lab Results  Component Value Date   PLT 253 11/24/2023    Diet :  Diet Order             DIET DYS 3 Room service appropriate? No; Fluid consistency: Thin  Diet effective now                    Inpatient Medications  Scheduled Meds:  amLODipine   10 mg Oral Daily   aspirin   325 mg Oral Daily   brimonidine   1 drop Both Eyes TID   carvedilol   6.25 mg Oral BID WC   docusate sodium   200 mg Oral BID   ferrous sulfate   325 mg Oral BID WC   folic acid   1 mg Oral Daily   hydrALAZINE   50 mg Oral Q8H   insulin  aspart  0-9 Units Subcutaneous TID WC   isosorbide  mononitrate  30 mg Oral Daily   leptospermum manuka honey  1 Application Topical Daily   pantoprazole   40 mg  Oral Daily   polyethylene glycol  17 g Oral BID   rosuvastatin   20 mg Oral QHS   tamsulosin   0.4 mg Oral Daily   Continuous Infusions:    PRN Meds:.hydrALAZINE , HYDROcodone -acetaminophen   Antibiotics  :    Anti-infectives (From admission, onward)    None         Objective:   Vitals:   11/24/23 0037 11/24/23 0400 11/24/23 0435 11/24/23 0744  BP: 136/64 (!) 166/80  (!) 178/78  Pulse: 65 81  90  Resp: 16     Temp: 98 F  (36.7 C)  98.2 F (36.8 C) 97.8 F (36.6 C)  TempSrc: Oral  Axillary Oral  SpO2: 94% 96% 92% 94%  Weight:      Height:        Wt Readings from Last 3 Encounters:  11/17/23 71 kg  11/07/23 72.1 kg  10/05/23 73.6 kg     Intake/Output Summary (Last 24 hours) at 11/24/2023 1009 Last data filed at 11/24/2023 0600 Gross per 24 hour  Intake 100 ml  Output 1800 ml  Net -1700 ml     Physical Exam  Awake Alert, No new F.N deficits, Normal affect [place].AT,PERRAL Supple Neck, No JVD,   Symmetrical Chest wall movement, Good air movement bilaterally, CTAB RRR,No Gallops,Rubs or new Murmurs,  +ve B.Sounds, Abd Soft, No tenderness,   No Cyanosis, Clubbing or edema        Data Review:    Recent Labs  Lab 11/19/23 0317 11/20/23 0416 11/21/23 0415 11/22/23 0357 11/23/23 0433 11/24/23 0445  WBC 5.8 4.2 4.2 5.0 7.8 5.3  HGB 10.7* 8.9* 8.7* 9.1* 9.3* 8.8*  HCT 33.6* 27.8* 28.1* 28.2* 29.3* 28.2*  PLT 225 201 191 219 256 253  MCV 83.0 82.7 86.2 83.2 84.4 84.9  MCH 26.4 26.5 26.7 26.8 26.8 26.5  MCHC 31.8 32.0 31.0 32.3 31.7 31.2  RDW 16.5* 16.4* 16.4* 16.6* 16.5* 16.5*  LYMPHSABS 0.5* 0.8 0.6* 0.5* 0.6*  --   MONOABS 0.8 0.7 0.7 0.6 0.7  --   EOSABS 0.1 0.1 0.2 0.1 0.1  --   BASOSABS 0.0 0.0 0.0 0.0 0.0  --     Recent Labs  Lab 11/17/23 2033 11/17/23 2104 11/17/23 2254 11/18/23 0334 11/18/23 0521 11/19/23 0317 11/20/23 0416 11/21/23 0415 11/22/23 0357 11/23/23 0433 11/24/23 0422  NA 145 146*  --  145  --    < > 143 140 144 146* 140  K 4.2 3.4*  --  3.7  --    < > 3.4* 4.0 4.0 4.3 3.9  CL 106 114*  --  112*  --    < > 109 111 110 110 107  CO2 14*  --   --  16*  --    < > 20* 18* 24 23 22   ANIONGAP 25*  --   --  17*  --    < > 14 11 10 13 11   GLUCOSE 120* 107*  --  106*  --    < > 114* 124* 125* 108* 101*  BUN 88* 68*  --  87*  --    < > 81* 73* 56* 48* 46*  CREATININE 3.95* 3.30*  --  3.63*  --    < > 2.73* 2.38* 2.11* 2.15* 2.05*  AST 109*  --   --  72*  --   --    --   --   --   --   --   ALT 51*  --   --  37  --   --   --   --   --   --   --   ALKPHOS 54  --   --  42  --   --   --   --   --   --   --   BILITOT 1.4*  --   --  1.4*  --   --   --   --   --   --   --   ALBUMIN 3.4*  --   --  2.6*  --   --   --   --   --   --   --   LATICACIDVEN  --  1.1 0.7  --   --   --   --   --   --   --   --   TSH  --   --   --   --  1.269  --   --   --   --   --   --   HGBA1C  --   --   --  5.9*  --   --   --   --   --   --   --   AMMONIA  --   --   --   --  16  --   --   --   --   --   --   MG  --   --   --   --   --   --  2.0 1.8 1.7 1.6* 2.3  PHOS  --   --   --   --   --   --  3.6 2.9 3.4 3.6  --   CALCIUM  9.2  --   --  8.5*  --    < > 8.5* 8.5* 8.9 9.1 8.8*   < > = values in this interval not displayed.      Recent Labs  Lab 11/17/23 2104 11/17/23 2254 11/18/23 0334 11/18/23 0521 11/19/23 0317 11/20/23 0416 11/21/23 0415 11/22/23 0357 11/23/23 0433 11/24/23 0422  LATICACIDVEN 1.1 0.7  --   --   --   --   --   --   --   --   TSH  --   --   --  1.269  --   --   --   --   --   --   HGBA1C  --   --  5.9*  --   --   --   --   --   --   --   AMMONIA  --   --   --  16  --   --   --   --   --   --   MG  --   --   --   --   --  2.0 1.8 1.7 1.6* 2.3  CALCIUM   --   --  8.5*  --    < > 8.5* 8.5* 8.9 9.1 8.8*   < > = values in this interval not displayed.    --------------------------------------------------------------------------------------------------------------- Lab Results  Component Value Date   CHOL 121 04/08/2022   HDL 27 (L) 04/08/2022   LDLCALC 63 04/08/2022   TRIG 267 (H) 04/08/2022   CHOLHDL 4.5 04/08/2022    Lab Results  Component Value Date   HGBA1C 5.9 (H) 11/18/2023      Micro Results No results found for this or any previous visit (from  the past 240 hours).  Radiology Report No results found.    Signature  -   Lavada Stank M.D on 11/24/2023 at 10:09 AM   -  To page go to [email or web address]

## 2023-11-25 ENCOUNTER — Inpatient Hospital Stay (HOSPITAL_COMMUNITY)

## 2023-11-25 DIAGNOSIS — N179 Acute kidney failure, unspecified: Secondary | ICD-10-CM | POA: Diagnosis not present

## 2023-11-25 LAB — CBC WITH DIFFERENTIAL/PLATELET
Abs Immature Granulocytes: 0.03 K/uL (ref 0.00–0.07)
Basophils Absolute: 0 K/uL (ref 0.0–0.1)
Basophils Relative: 0 %
Eosinophils Absolute: 0.2 K/uL (ref 0.0–0.5)
Eosinophils Relative: 3 %
HCT: 26.6 % — ABNORMAL LOW (ref 39.0–52.0)
Hemoglobin: 8.5 g/dL — ABNORMAL LOW (ref 13.0–17.0)
Immature Granulocytes: 1 %
Lymphocytes Relative: 8 %
Lymphs Abs: 0.5 K/uL — ABNORMAL LOW (ref 0.7–4.0)
MCH: 26.8 pg (ref 26.0–34.0)
MCHC: 32 g/dL (ref 30.0–36.0)
MCV: 83.9 fL (ref 80.0–100.0)
Monocytes Absolute: 0.8 K/uL (ref 0.1–1.0)
Monocytes Relative: 12 %
Neutro Abs: 5.1 K/uL (ref 1.7–7.7)
Neutrophils Relative %: 76 %
Platelets: 248 K/uL (ref 150–400)
RBC: 3.17 MIL/uL — ABNORMAL LOW (ref 4.22–5.81)
RDW: 16.1 % — ABNORMAL HIGH (ref 11.5–15.5)
WBC: 6.6 K/uL (ref 4.0–10.5)
nRBC: 0 % (ref 0.0–0.2)

## 2023-11-25 LAB — GLUCOSE, CAPILLARY
Glucose-Capillary: 108 mg/dL — ABNORMAL HIGH (ref 70–99)
Glucose-Capillary: 114 mg/dL — ABNORMAL HIGH (ref 70–99)
Glucose-Capillary: 115 mg/dL — ABNORMAL HIGH (ref 70–99)
Glucose-Capillary: 135 mg/dL — ABNORMAL HIGH (ref 70–99)
Glucose-Capillary: 97 mg/dL (ref 70–99)

## 2023-11-25 LAB — BASIC METABOLIC PANEL WITH GFR
Anion gap: 8 (ref 5–15)
BUN: 42 mg/dL — ABNORMAL HIGH (ref 8–23)
CO2: 23 mmol/L (ref 22–32)
Calcium: 8.7 mg/dL — ABNORMAL LOW (ref 8.9–10.3)
Chloride: 106 mmol/L (ref 98–111)
Creatinine, Ser: 2 mg/dL — ABNORMAL HIGH (ref 0.61–1.24)
GFR, Estimated: 32 mL/min — ABNORMAL LOW (ref >60)
Glucose, Bld: 104 mg/dL — ABNORMAL HIGH (ref 70–99)
Potassium: 4 mmol/L (ref 3.5–5.1)
Sodium: 137 mmol/L (ref 135–145)

## 2023-11-25 LAB — MAGNESIUM: Magnesium: 2 mg/dL (ref 1.7–2.4)

## 2023-11-25 MED ORDER — OFLOXACIN 0.3 % OP SOLN
1.0000 [drp] | OPHTHALMIC | Status: DC
  Administered 2023-11-26 – 2023-11-27 (×10): 1 [drp] via OPHTHALMIC
  Filled 2023-11-25: qty 5

## 2023-11-25 MED ORDER — OFLOXACIN 0.3 % OP SOLN
1.0000 [drp] | OPHTHALMIC | Status: AC
  Administered 2023-11-25: 1 [drp] via OPHTHALMIC
  Filled 2023-11-25 (×2): qty 5

## 2023-11-25 NOTE — Progress Notes (Signed)
 Speech Language Pathology Treatment: Dysphagia  Patient Details Name: Peter Mann MRN: [id number] DOB: [birth year] Today's Date: 11/25/2023 Time: 1440-1500 SLP Time Calculation (min) (ACUTE ONLY): 20 min  Assessment / Plan / Recommendation Clinical Impression  Skilled therapy session focused on dysphagia education. SLP spoke with patient, patients daughter and RN re: results of MBSS. SLP discussed patients cervical structural abnormalities and pharyngeal deficits resulting in consistent aspiration and severe pharyngeal residuals. SLP provided safest diet options and explained risk of each (NPO vs PO) including risks of: aspiration PNA/respiratory decline vs dehydration vs feeding tube. Patients daughter reports wanting to continue PO diet, though willing to try softer textures. Initiate dysphagia 2 diet with thin liquids. Patient should receive strict full supervision for use of compensatory strategies including multiple swallows per bite. SLP to continue to follow.  HPI HPI: The pt is an 83 yo male presenting 9/9 after being found on ground by family. Admitted for management of rhabdomyolysis and possible UTI. PMH includes: HTN, HLD, CVA, DM II, tobacco use, BPH, and dementia. BSE 9/9 impulsive, no s/s aspiration. F/u 9/12 no s/s aspiration, rec Dys 3/thin and signed off. Coughing with thin during PT session 9/15 and swallow re-eval ordered. 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 Bulky anterior  osteophytes. Multilevel degenerative disc space narrowing. Fusion of  the facets at multiple levels with facet degenerative change.       Recommendations  Diet recommendations: Dysphagia 2 (fine chop);Thin liquid Medication Administration: Whole meds with liquid Supervision: Full supervision/cueing for compensatory strategies Compensations: Slow rate;Small sips/bites;Multiple  dry swallows after each bite/sip;Clear throat intermittently Postural Changes and/or Swallow Maneuvers: Seated upright 90 degrees        Oral care BID   Intermittent Supervision/Assistance Dysphagia, unspecified (R13.10)           Ariahna Smiddy M.A., CCC-SLP 11/25/2023, 3:03 PM

## 2023-11-25 NOTE — Procedures (Addendum)
 Modified Barium Swallow Study  Patient Details  Name: Peter Mann Date of Birth: 28-Jan-1941  Today's Date: 11/25/2023  Modified Barium Swallow completed.  Full report located under Chart Review in the Imaging Section.  History of Present Illness The pt is an 83 yo male presenting 9/9 after being found on ground by family. Admitted for management of rhabdomyolysis and possible UTI. PMH includes: HTN, HLD, CVA, DM II, tobacco use, BPH, and dementia. BSE 9/9 impulsive, no s/s aspiration. F/u 9/12 no s/s aspiration, rec Dys 3/thin and signed off. Coughing with thin during PT session 9/15 and swallow re-eval ordered. 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 Bulky anterior  osteophytes. Multilevel degenerative disc space narrowing. Fusion of  the facets at multiple levels with facet degenerative change.   Clinical Impression Patient presents with elements of pharyngeal dysphagia exacerbated by structural components. Patients oral phase is characterized by oral residuals, requiring occasional additional swallows to clear oral cavity. Majority of deficits are noted pharyngeally where patient achieves minimal epiglottic inversion and pharyngeal stasis due to chronic structural abnormalities. These deficits resulted in severe vallecular and pyriform sinus residuals as well as gross audible aspiration of large volumes of thin liquids. Patient with eventual silent aspiration of nectar and honey thick liquids residuals, though unable to differenciate consistency due to amount of pharyngeal residuals. SLP attempted use of chin tuck and effortful swallow, though these compensatory strategies were minimally effective and patient cognitively unable to utilize independently. Pharyngeal residuals were reduced after multiple dry swallows, however not eliminated. Per MD, no recent  ethiologies that would result in overall change in swallow function and therefore patients dysphagia is likely due to chronic structural issues. Patient with [facility] WBC, vital signs and CXR. SLP to speak with family regarding safest swallow recommendations vs continuation of current diet.  Factors that may increase risk of adverse event in presence of aspiration Peter Mann & Peter Mann 2021): Reduced cognitive function;Aspiration of thick, dense, and/or acidic materials;Frequent aspiration of large volumes;Frail or deconditioned  Swallow Evaluation Recommendations Supervision: Full supervision/cueing for swallowing strategies Swallowing strategies  : Minimize environmental distractions;Slow rate;Small bites/sips;effortful swallow;Multiple dry swallows after each bite/sip Postural changes: Position pt fully upright for meals;Stay upright 30-60 min after meals Oral care recommendations: Oral care QID (4x/day)    Peter Mann M.A., CCC-SLP 11/25/2023,2:35 PM

## 2023-11-25 NOTE — Progress Notes (Signed)
 PROGRESS NOTE                                                                                                                                                                                                             Patient Demographics:    Peter Mann, is a 83 y.o. male, DOB - Mar 27, 1940, FMW:[id number]  Outpatient Primary MD for the patient is [name], MD    LOS - 7  Admit date - 11/17/2023    Chief Complaint  Patient presents with   Fall       Brief Narrative (HPI from H&P)   83 y.o. male with PMH significant for DM2, HTN, HLD, smoking, stroke, dementia, CKD, COPD, recurrent falls, prostate cancer s/p radiation. 9/9, patient was brought to the ED after he was found on the ground.\  Per report, family last saw him 4 days ago and was not able to hear back from him after that.  They sent out a wellness check and EMS found the patient on the floor confused and brought to the ED. in the ER he was diagnosed with dehydration, rhabdomyolysis, AKI and admitted.   Subjective:   Patient in bed, appears comfortable, denies any headache, no fever, no chest pain or pressure, no shortness of breath , Foley catheter inserted yesterday for gross hematuria   Assessment  & Plan :    AKI on CKD 2.  Baseline creatinine around 1.3, acute metabolic acidosis, fall related rhabdomyolysis. He had multiple falls in the past several months according to the daughter, MRI brain nonacute, MRI C-spine nonacute.  Continue to hydrate with IV fluids, AKI improving, PT OT, may require placement.  History of C3-C4 spinal stenosis.  PT OT and outpatient follow-up with neurology and neurosurgery.  Acute on chronic mid-low back pain.  Multiple falls over the last several months at home, CT T and L-spine noted with nonspecific fractures, case discussed with neurosurgeon Dr. Reyes Budge in detail on 11/19/2023, no urgent surgery needed, TLSO  brace, PT OT and pain control.  Encouraged patient to think about SNF due to ongoing multiple falls.  Outpatient follow-up with Dr. Budge postdischarge, no MRI needed.  Gross hematuria Radiation cystitis - Urology input greatly appreciated, concern for radiation cystitis, recommendation for conservative management, hold anticoagulation, DC Foley catheter by tomorrow if no recurrence. - Patient will need  cystoscopy as an outpatient  Acute metabolic encephalopathy with underlying dementia.  MRI brain nonacute, minimize narcotics.  PT OT speech.  As needed Haldol.  Dysphagia.  Speech following.  MBS study today, concern for aspiration and dysphagia related to structural issue related to cervical spine disease .  He remains at risk for aspiration.   Hypertension.  Placed on combination of Norvasc , beta-blocker and Imdur , adding scheduled hydralazine  along with IV hydralazine  as needed.  Monitor and adjust.  Anemia of chronic disease, chronic vitamin B12 deficiency.  Folic acid  deficiency - placed on oral iron and B12 supplementation, as well on folic acid  supplements.  Dehydration with hypernatremia.  Gentle IV fluids and monitor.    Conjunctivitis -continue with eyedrops  History of stroke.  Continue on aspirin  but will cut down from 324 to 81 mg on 11/24/2023 due to hematuria, continue statin for secondary prevention.  DM type II.  SSI.  CBG (last 3)  Recent Labs    11/25/23 0031 11/25/23 0758 11/25/23 1202  GLUCAP 97 114* 135*   Lab Results  Component Value Date   HGBA1C 5.9 (H) 11/18/2023         Condition - Fair  Family Communication  : None at bedside  Code Status : Full code  Consults  : Urology on 11/24/2023  PUD Prophylaxis :  ppi   Procedures  :     CT thoracic - 1. No acute compression fracture. 2. Findings concerning for fractured osteophytes at T9-10 with lucency and gas extending into the anterior aspect of the T9-10 disc.   CT lumbar spine.  1. No  evidence of acute compression fracture or displaced fracture in the lumbar spine. 2. 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 3. Moderate-to-severe bilateral foraminal stenosis at L4-5 and L5-S1, increased from prior  MRI brain. 1. No acute intracranial abnormality. 2. 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 Other moderate for age cerebral white matter disease has progressed since that time. 3. 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 Spinal stenosis at C3-C4 appears chronic but may have increased from prior 2015, 2017 MRIs.  CT C-spine.  Nonacute      Disposition Plan  :    Status is: Inpatient  DVT Prophylaxis  :    Place and maintain sequential compression device Start: 11/24/23 1009    Lab Results  Component Value Date   PLT 248 11/25/2023    Diet :  Diet Order             DIET DYS 3 Room service appropriate? No; Fluid consistency: Thin  Diet effective now                    Inpatient Medications  Scheduled Meds:  amLODipine   10 mg Oral Daily   aspirin  EC  81 mg Oral Daily   brimonidine   1 drop Both Eyes TID   carvedilol   6.25 mg Oral BID WC   Chlorhexidine  Gluconate Cloth  6 each Topical Daily   docusate sodium   200 mg Oral BID   ferrous sulfate   325 mg Oral BID WC   folic acid   1 mg Oral Daily   hydrALAZINE   50 mg Oral Q8H   insulin  aspart  0-9 Units Subcutaneous TID WC   isosorbide  mononitrate  30 mg Oral Daily   leptospermum manuka honey  1 Application Topical Daily   ofloxacin   1 drop Both Eyes Q2H   Followed by   [START ON 11/26/2023] ofloxacin   1 drop Both Eyes Q4H   pantoprazole   40 mg Oral Daily   polyethylene glycol  17 g Oral BID   rosuvastatin   20 mg Oral QHS   tamsulosin   0.4 mg Oral Daily   Continuous Infusions:    PRN Meds:.hydrALAZINE ,  HYDROcodone -acetaminophen   Antibiotics  :    Anti-infectives (From admission, onward)    None         Objective:   Vitals:   11/25/23 0000 11/25/23 0400 11/25/23 0802 11/25/23 1203  BP: (!) 148/62 (!) 161/75 (!) 151/80 129/65  Pulse: 74 84 84 76  Resp: 19 (!) 22 20 (!) 21  Temp: 97.7 F (36.5 C) 98.2 F (36.8 C) 98.1 F (36.7 C) 97.7 F (36.5 C)  TempSrc: Oral Oral Oral Oral  SpO2: 98% 96% 99% 97%  Weight:      Height:        Wt Readings from Last 3 Encounters:  11/17/23 71 kg  11/07/23 72.1 kg  10/05/23 73.6 kg     Intake/Output Summary (Last 24 hours) at 11/25/2023 1332 Last data filed at 11/25/2023 0803 Gross per 24 hour  Intake --  Output 1400 ml  Net -1400 ml     Physical Exam  Awake Alert, frail, deconditioned Symmetrical Chest wall movement, Good air movement bilaterally, CTAB RRR,No Gallops,Rubs or new Murmurs, No Parasternal Heave +ve B.Sounds, Abd Soft, No tenderness, No rebound - guarding or rigidity. No Cyanosis, Clubbing or edema, No new Rash or bruise          Data Review:    Recent Labs  Lab 11/20/23 0416 11/21/23 0415 11/22/23 0357 11/23/23 0433 11/24/23 0445 11/25/23 0412  WBC 4.2 4.2 5.0 7.8 5.3 6.6  HGB 8.9* 8.7* 9.1* 9.3* 8.8* 8.5*  HCT 27.8* 28.1* 28.2* 29.3* 28.2* 26.6*  PLT 201 191 219 256 253 248  MCV 82.7 86.2 83.2 84.4 84.9 83.9  MCH 26.5 26.7 26.8 26.8 26.5 26.8  MCHC 32.0 31.0 32.3 31.7 31.2 32.0  RDW 16.4* 16.4* 16.6* 16.5* 16.5* 16.1*  LYMPHSABS 0.8 0.6* 0.5* 0.6*  --  0.5*  MONOABS 0.7 0.7 0.6 0.7  --  0.8  EOSABS 0.1 0.2 0.1 0.1  --  0.2  BASOSABS 0.0 0.0 0.0 0.0  --  0.0    Recent Labs  Lab 11/20/23 0416 11/21/23 0415 11/22/23 0357 11/23/23 0433 11/24/23 0422 11/24/23 1050 11/25/23 0412  NA 143 140 144 146* 140  --  137  K 3.4* 4.0 4.0 4.3 3.9  --  4.0  CL 109 111 110 110 107  --  106  CO2 20* 18* 24 23 22   --  23  ANIONGAP 14 11 10 13 11   --  8  GLUCOSE 114* 124* 125* 108* 101*  --  104*   BUN 81* 73* 56* 48* 46*  --  42*  CREATININE 2.73* 2.38* 2.11* 2.15* 2.05*  --  2.00*  INR  --   --   --   --   --  1.3*  --   MG 2.0 1.8 1.7 1.6* 2.3  --  2.0  PHOS 3.6 2.9 3.4 3.6  --   --   --   CALCIUM  8.5* 8.5* 8.9 9.1 8.8*  --  8.7*      Recent Labs  Lab 11/21/23 0415 11/22/23 0357 11/23/23 0433 11/24/23 0422 11/24/23 1050  11/25/23 0412  INR  --   --   --   --  1.3*  --   MG 1.8 1.7 1.6* 2.3  --  2.0  CALCIUM  8.5* 8.9 9.1 8.8*  --  8.7*    --------------------------------------------------------------------------------------------------------------- Lab Results  Component Value Date   CHOL 121 04/08/2022   HDL 27 (L) 04/08/2022   LDLCALC 63 04/08/2022   TRIG 267 (H) 04/08/2022   CHOLHDL 4.5 04/08/2022    Lab Results  Component Value Date   HGBA1C 5.9 (H) 11/18/2023      Micro Results No results found for this or any previous visit (from the past 240 hours).  Radiology Report US  RENAL Result Date: 11/24/2023 CLINICAL DATA:  Hematuria. EXAM: RENAL / URINARY TRACT ULTRASOUND COMPLETE COMPARISON:  August 27, 2015. FINDINGS: Right Kidney: Renal measurements: 11.6 x 5.0 x 4.8 cm = volume: 146 mL. Echogenicity within normal limits. No mass or hydronephrosis visualized. Left Kidney: Renal measurements: 11.3 x 4.8 x 5.9 cm = volume: 167 mL. Echogenicity within normal limits. No mass or hydronephrosis visualized. Bladder: Large amount of heterogeneous material is noted in the dependent portion of urinary bladder most consistent with debris or possibly hemorrhage. Cystoscopy is recommended for further evaluation. Other: None. IMPRESSION: 1. Large amount of heterogeneous material is noted in the dependent portion of urinary bladder most consistent with debris or possibly hemorrhage. Cystoscopy is recommended to evaluate for possible mass. 2. No hydronephrosis or renal obstruction is noted. Electronically Signed   By: Lynwood Landy Raddle M.D.   On: 11/24/2023 12:20      Signature   -   Brayton Ranen Doolin M.D on 11/25/2023 at 1:32 PM   -  To page go to [email or web address]

## 2023-11-25 NOTE — Progress Notes (Signed)
 Occupational Therapy Treatment Patient Details Name: Peter Mann MRN: [id number] DOB: 1941/01/30 Today's Date: 11/25/2023   History of present illness The pt is an 83 yo male presenting 9/9 after being found on ground by family. Admitted for management of rhabdomyolysis and possible UTI. CT T and L-spine noted with nonspecific fractures. PMH includes: HTN, HLD, CVA, DM II, tobacco use, BPH, and dementia.   OT comments  OT session focused on training in techniques for increased safety and independence with ADLs and bed mobility and functional transfers during/in preparation for ADLs. Pt presents with increased confusion and decreased cognition as compared to OT eval on 11/19/23 (see additional details below). Pt largely currently requiring Set up to Total assist with ADLs, Mod to Max assist with bed mobility, and Mod assist with functional transfers with a RW. Pt also requiring Total assist to donn/doff TLSO brace and requiring max cues for safety and nearly constant cues to adhere to back precautions this session. Pt participated well in session, but is not making progress toward OT goals at this time with noted decreased cognition and decreased functional level as compared to OT eval on 11/19/23. OT to further assess pt progress next session. Pt VSS on RA. Pt will benefit from continued acute skilled OT services. Post acute discharge, pt will benefit from intensive inpatient skilled rehab services < 3 hours per day to maximize rehab potential.       If plan is discharge home, recommend the following:  A lot of help with walking and/or transfers;A lot of help with bathing/dressing/bathroom;Assistance with cooking/housework;Direct supervision/assist for medications management;Direct supervision/assist for financial management;Assist for transportation;Help with stairs or ramp for entrance   Equipment Recommendations  Other (comment) (defer to next level of care)    Recommendations for Other Services       Precautions / Restrictions Precautions Precautions: Fall;Back Precaution Booklet Issued: No Recall of Precautions/Restrictions: Impaired Precaution/Restrictions Comments: verbally reviewed back precautions; pt verbalized understanding, but requiring Max-Total cues to adhere to back precautions during tasks Required Braces or Orthoses: Spinal Brace Spinal Brace: Thoracolumbosacral orthotic;Applied in sitting position Restrictions Weight Bearing Restrictions Per Provider Order: No Other Position/Activity Restrictions: back precautions       Mobility Bed Mobility Overal bed mobility: Needs Assistance Bed Mobility: Sidelying to Sit, Sit to Sidelying   Sidelying to sit: Mod assist, Max assist, HOB elevated, Used rails     Sit to sidelying: Max assist, HOB elevated, Used rails General bed mobility comments: Pt needed mod-max assist and cues to manage each leg off EOB with assistance and to push up and ascend trunk and pivot body to R EOB from elevated HOB with use of pad as well. Max cues to adhere to back precautions during bed mobility.    Transfers Overall transfer level: Needs assistance Equipment used: Rolling walker (2 wheels), 1 person hand held assist Transfers: Sit to/from Stand, Bed to chair/wheelchair/BSC Sit to Stand: Mod assist, From elevated surface     Step pivot transfers: Mod assist     General transfer comment: Pt needed cues for hand placement to stand as well as mod assist to power up from EOB.  Pt needed cues to use RW and stand tall once up as he wanted to maintain flexed posture. Min-ModA for balance throughout. Cues to adher to back precautions     Balance Overall balance assessment: Needs assistance Sitting-balance support: No upper extremity supported, Feet supported Sitting balance-Leahy Scale: Fair Sitting balance - Comments: Able to sit EOB statically with Supervision  Standing balance support: Bilateral upper extremity supported, During  functional activity, Reliant on assistive device for balance Standing balance-Leahy Scale: Poor Standing balance comment: reliant on UE support and min-mod A                           ADL either performed or assessed with clinical judgement   ADL Overall ADL's : Needs assistance/impaired Eating/Feeding: Set up;Minimal assistance;Bed level Eating/Feeding Details (indicate cue type and reason): Pt finishing lunch upon OT arrival with pt noted to have spilled a large portion of his lunch on his gown. During OT eval on 11/19/23, pt was able to self-feed with Set-up and minimal spillage. Grooming: Wash/dry hands;Wash/dry face;Set up;Supervision/safety;Cueing for sequencing;Sitting           Upper Body Dressing : Minimal assistance;Moderate assistance;Cueing for sequencing;Sitting Upper Body Dressing Details (indicate cue type and reason): Mod assist to doff soiled gown and Min assist to donn clean gown with cues     Toilet Transfer: Moderate assistance;BSC/3in1;Rolling walker (2 wheels);Cueing for safety;Cueing for sequencing (step-pivot transfer; cues to adhere to back precautions)   Toileting- Clothing Manipulation and Hygiene: Total assistance;Cueing for safety;Sit to/from stand         General ADL Comments: Total assist to donn/doff TLSO brace    Extremity/Trunk Assessment Upper Extremity Assessment Upper Extremity Assessment: Right hand dominant;RUE deficits/detail;LUE deficits/detail RUE Deficits / Details: gross shoulder strength 3/5 all other strength grossly 4 to 4+/5; decreased shoulder ROM at baseline; mildly decreased elbow extension at baseline; decreased coordination RUE Coordination: decreased gross motor;decreased fine motor LUE Deficits / Details: gross shoulder strength 3/5 all other strength grossly 3+ to 4/5; decreased shoulder ROM at baseline; mildly decreased elbow extension at baseline; decreased coordination LUE Coordination: decreased fine  motor;decreased gross motor   Lower Extremity Assessment Lower Extremity Assessment: Defer to PT evaluation        Vision       Perception     Praxis     Communication Communication Communication: Impaired Factors Affecting Communication: Hearing impaired;Reduced clarity of speech;Difficulty expressing self (Requires increased time for processing and increased time for word finding)   Cognition Arousal: Alert Behavior During Therapy: WFL for tasks assessed/performed, Impulsive (occasionally impulsive with movement) Cognition: History of cognitive impairments, Cognition impaired, No family/caregiver present to determine baseline   Orientation impairments: Place, Time, Situation (Pt stating he was at home several times and initially thought OT was joking with him when oriented to being in the hospital. Pt also stating the bed was a car several times and asking OT to show him how to drive it.) Awareness: Intellectual awareness intact, Online awareness impaired Memory impairment (select all impairments): Short-term memory, Working [employer] fast streamer, Engineer, structural memory Attention impairment (select first level of impairment): Selective attention Executive functioning impairment (select all impairments): Organization, Sequencing, Reasoning, Problem solving OT - Cognition Comments: Pt pleasant and participating well throughout session but with noted decreased orientation and decreased cognition this session as compared to OT eval on 11/19/23.                 Following commands: Impaired Following commands impaired: Follows one step commands inconsistently, Follows one step commands with increased time      Cueing   Cueing Techniques: Verbal cues, Gestural cues, Tactile cues, Visual cues  Exercises      Shoulder Instructions       General Comments VSS on RA    Pertinent Vitals/ Pain  Pain Assessment Pain Assessment: Faces Faces Pain Scale: Hurts even more Pain  Location: pt reports everywhere Pain Descriptors / Indicators: Discomfort, Grimacing, Guarding, Moaning Pain Intervention(s): Limited activity within patient's tolerance, Monitored during session, Repositioned  Home Living                                          Prior Functioning/Environment              Frequency  Min 2X/week        Progress Toward Goals  OT Goals(current goals can now be found in the care plan section)  Progress towards OT goals: Not progressing toward goals - comment (Pt with noted cognitive and functional decline since OT eval on 11/19/23.)  Acute Rehab OT Goals Patient Stated Goal: pt did not state this session  Plan      Co-evaluation                 AM-PAC OT 6 Clicks Daily Activity     Outcome Measure   Help from another person eating meals?: A Little Help from another person taking care of personal grooming?: A Little Help from another person toileting, which includes using toliet, bedpan, or urinal?: Total Help from another person bathing (including washing, rinsing, drying)?: A Lot Help from another person to put on and taking off regular upper body clothing?: A Lot Help from another person to put on and taking off regular lower body clothing?: A Lot 6 Click Score: 13    End of Session Equipment Utilized During Treatment: Rolling walker (2 wheels);Other (comment);Gait belt (TLSO brace, BSC)  OT Visit Diagnosis: Unsteadiness on feet (R26.81);Other abnormalities of gait and mobility (R26.89);Repeated falls (R29.6);Muscle weakness (generalized) (M62.81);History of falling (Z91.81);Other symptoms and signs involving cognitive function   Activity Tolerance Patient tolerated treatment well   Patient Left in bed;with call bell/phone within reach;with bed alarm set   Nurse Communication Mobility status;Other (comment) (Pt with decreased confusion and decreased functional level as compared to OT eval on 11/19/23)         Time: 8595-8563 OT Time Calculation (min): 32 min  Charges: OT General Charges $OT Visit: 1 Visit OT Treatments $Self Care/Home Management : 23-37 mins  Margarie Rockey HERO., OTR/L, MA Acute Rehab [phone]   Margarie FORBES Horns 11/25/2023, 3:02 PM

## 2023-11-26 DIAGNOSIS — N179 Acute kidney failure, unspecified: Secondary | ICD-10-CM | POA: Diagnosis not present

## 2023-11-26 LAB — GLUCOSE, CAPILLARY
Glucose-Capillary: 103 mg/dL — ABNORMAL HIGH (ref 70–99)
Glucose-Capillary: 138 mg/dL — ABNORMAL HIGH (ref 70–99)
Glucose-Capillary: 148 mg/dL — ABNORMAL HIGH (ref 70–99)

## 2023-11-26 LAB — CBC WITH DIFFERENTIAL/PLATELET
Abs Immature Granulocytes: 0.06 K/uL (ref 0.00–0.07)
Basophils Absolute: 0 K/uL (ref 0.0–0.1)
Basophils Relative: 0 %
Eosinophils Absolute: 0.1 K/uL (ref 0.0–0.5)
Eosinophils Relative: 1 %
HCT: 25.5 % — ABNORMAL LOW (ref 39.0–52.0)
Hemoglobin: 8.1 g/dL — ABNORMAL LOW (ref 13.0–17.0)
Immature Granulocytes: 1 %
Lymphocytes Relative: 4 %
Lymphs Abs: 0.4 K/uL — ABNORMAL LOW (ref 0.7–4.0)
MCH: 26.7 pg (ref 26.0–34.0)
MCHC: 31.8 g/dL (ref 30.0–36.0)
MCV: 84.2 fL (ref 80.0–100.0)
Monocytes Absolute: 1 K/uL (ref 0.1–1.0)
Monocytes Relative: 10 %
Neutro Abs: 8.3 K/uL — ABNORMAL HIGH (ref 1.7–7.7)
Neutrophils Relative %: 84 %
Platelets: 242 K/uL (ref 150–400)
RBC: 3.03 MIL/uL — ABNORMAL LOW (ref 4.22–5.81)
RDW: 16 % — ABNORMAL HIGH (ref 11.5–15.5)
WBC: 9.9 K/uL (ref 4.0–10.5)
nRBC: 0 % (ref 0.0–0.2)

## 2023-11-26 LAB — PHOSPHORUS: Phosphorus: 4.6 mg/dL (ref 2.5–4.6)

## 2023-11-26 LAB — BASIC METABOLIC PANEL WITH GFR
Anion gap: 13 (ref 5–15)
BUN: 32 mg/dL — ABNORMAL HIGH (ref 8–23)
CO2: 23 mmol/L (ref 22–32)
Calcium: 8.8 mg/dL — ABNORMAL LOW (ref 8.9–10.3)
Chloride: 102 mmol/L (ref 98–111)
Creatinine, Ser: 1.81 mg/dL — ABNORMAL HIGH (ref 0.61–1.24)
GFR, Estimated: 36 mL/min — ABNORMAL LOW (ref >60)
Glucose, Bld: 103 mg/dL — ABNORMAL HIGH (ref 70–99)
Potassium: 3.5 mmol/L (ref 3.5–5.1)
Sodium: 138 mmol/L (ref 135–145)

## 2023-11-26 LAB — MAGNESIUM: Magnesium: 1.8 mg/dL (ref 1.7–2.4)

## 2023-11-26 MED ORDER — POTASSIUM CHLORIDE CRYS ER 20 MEQ PO TBCR
20.0000 meq | EXTENDED_RELEASE_TABLET | Freq: Once | ORAL | Status: AC
  Administered 2023-11-26: 20 meq via ORAL
  Filled 2023-11-26: qty 1

## 2023-11-26 NOTE — TOC Progression Note (Signed)
 Transition of Care [facility]) - Progression Note    Patient Details  Name: Peter Mann MRN: [id number] Date of Birth: 1940-11-29  [facility]) CM/SW Contact  Inocente GORMAN Kindle, LCSW Phone Number: 11/26/2023, 6:28 PM  Clinical Narrative:    CSW updated [facility] that patient will be ready for discharge tomorrow.    Expected Discharge Plan: Skilled Nursing Facility Barriers to Discharge: Continued Medical Work up               Expected Discharge Plan and Services In-house Referral: Clinical Social Work   Post Acute Care Choice: Skilled Nursing Facility Living arrangements for the past 2 months: Apartment                                       Social Drivers of Health (SDOH) Interventions SDOH Screenings   Food Insecurity: No Food Insecurity (11/18/2023)  Housing: Low Risk  (11/18/2023)  Transportation Needs: No Transportation Needs (11/18/2023)  Utilities: Not At Risk (11/18/2023)  Social Connections: Moderately Integrated (11/18/2023)  Tobacco Use: Medium Risk (11/18/2023)    Readmission Risk Interventions     No data to display

## 2023-11-26 NOTE — Progress Notes (Signed)
 PROGRESS NOTE                                                                                                                                                                                                             Patient Demographics:    Edge Mauger, is a 83 y.o. male, DOB - 12/17/1940, FMW:[id number]  Outpatient Primary MD for the patient is [name], MD    LOS - 8  Admit date - 11/17/2023    Chief Complaint  Patient presents with   Fall       Brief Narrative (HPI from H&P)   83 y.o. male with PMH significant for DM2, HTN, HLD, smoking, stroke, dementia, CKD, COPD, recurrent falls, prostate cancer s/p radiation. 9/9, patient was brought to the ED after he was found on the ground.\  Per report, family last saw him 4 days ago and was not able to hear back from him after that.  They sent out a wellness check and EMS found the patient on the floor confused and brought to the ED. in the ER he was diagnosed with dehydration, rhabdomyolysis, AKI and admitted.   Subjective:   No significant events overnight, he denies any complaints.   Assessment  & Plan :    AKI on CKD 2.  Baseline creatinine around 1.3, acute metabolic acidosis, fall related rhabdomyolysis. He had multiple falls in the past several months according to the daughter, MRI brain nonacute, MRI C-spine nonacute.  Continue to hydrate with IV fluids, AKI improving, PT OT, may require placement.  History of C3-C4 spinal stenosis.  PT OT and outpatient follow-up with neurology and neurosurgery.  Acute on chronic mid-low back pain.  Multiple falls over the last several months at home, CT T and L-spine noted with nonspecific fractures, case discussed with neurosurgeon Dr. Reyes Budge in detail on 11/19/2023, no urgent surgery needed, TLSO brace, PT OT and pain control.  Encouraged patient to think about SNF due to ongoing multiple falls.  Outpatient  follow-up with Dr. Budge postdischarge, no MRI needed.  Gross hematuria Radiation cystitis - Urology input greatly appreciated, concern for radiation cystitis, recommendation for conservative management, hold anticoagulation. - Patient with clear urine over last 48 hours, Foley catheter discontinued, continue to monitor with bladder scans, per urology recommendation replace catheter with 59f coud if retained volume exceeds 800 -  Patient will need cystoscopy as an outpatient  Acute metabolic encephalopathy with underlying dementia.  MRI brain nonacute, minimize narcotics.  PT OT speech.  As needed Haldol.  Dysphagia.  Speech following.  MBS study today, concern for aspiration and dysphagia related to structural issue related to cervical spine disease .  He remains at risk for aspiration.   Hypertension.  Placed on combination of Norvasc , beta-blocker and Imdur , adding scheduled hydralazine  along with IV hydralazine  as needed.  Monitor and adjust.  Anemia of chronic disease, chronic vitamin B12 deficiency.  Folic acid  deficiency - placed on oral iron and B12 supplementation, as well on folic acid  supplements.  Dehydration with hypernatremia.  Gentle IV fluids and monitor.    Conjunctivitis -continue with eyedrops  History of stroke.  Continue on aspirin  but will cut down from 324 to 81 mg on 11/24/2023 due to hematuria, continue statin for secondary prevention.  DM type II.  SSI.  CBG (last 3)  Recent Labs    11/26/23 0808 11/26/23 1226 11/26/23 1535  GLUCAP 103* 138* 148*   Lab Results  Component Value Date   HGBA1C 5.9 (H) 11/18/2023         Condition - Fair  Family Communication  : None at bedside  Code Status : Full code  Consults  : Urology on 11/24/2023  PUD Prophylaxis :  ppi   Procedures  :     CT thoracic - 1. No acute compression fracture. 2. Findings concerning for fractured osteophytes at T9-10 with lucency and gas extending into the anterior aspect  of the T9-10 disc.   CT lumbar spine.  1. No evidence of acute compression fracture or displaced fracture in the lumbar spine. 2. 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 3. Moderate-to-severe bilateral foraminal stenosis at L4-5 and L5-S1, increased from prior  MRI brain. 1. No acute intracranial abnormality. 2. 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 Other moderate for age cerebral white matter disease has progressed since that time. 3. 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 Spinal stenosis at C3-C4 appears chronic but may have increased from prior 2015, 2017 MRIs.  CT C-spine.  Nonacute      Disposition Plan  :    Status is: Inpatient  DVT Prophylaxis  :    Place and maintain sequential compression device Start: 11/24/23 1009    Lab Results  Component Value Date   PLT 242 11/26/2023    Diet :  Diet Order             DIET DYS 2 Room service appropriate? No; Fluid consistency: Thin  Diet effective now                    Inpatient Medications  Scheduled Meds:  amLODipine   10 mg Oral Daily   aspirin  EC  81 mg Oral Daily   brimonidine   1 drop Both Eyes TID   carvedilol   6.25 mg Oral BID WC   Chlorhexidine  Gluconate Cloth  6 each Topical Daily   docusate sodium   200 mg Oral BID   ferrous sulfate   325 mg Oral BID WC   folic acid   1 mg Oral Daily   hydrALAZINE   50 mg Oral Q8H   insulin  aspart  0-9 Units Subcutaneous TID WC   isosorbide  mononitrate  30 mg Oral Daily  leptospermum manuka honey  1 Application Topical Daily   ofloxacin   1 drop Both Eyes Q4H   pantoprazole   40 mg Oral Daily   polyethylene glycol  17 g Oral BID   rosuvastatin   20 mg Oral QHS   tamsulosin   0.4 mg Oral Daily   Continuous Infusions:    PRN Meds:.hydrALAZINE , HYDROcodone -acetaminophen   Antibiotics  :     Anti-infectives (From admission, onward)    None         Objective:   Vitals:   11/26/23 0443 11/26/23 0741 11/26/23 1210 11/26/23 1532  BP:  (!) 162/61 (!) 110/47 138/63  Pulse:  75 67   Resp: 18 16 16    Temp: 98.4 F (36.9 C) 98.3 F (36.8 C) 98.5 F (36.9 C) 98.6 F (37 C)  TempSrc: Oral Axillary Oral Oral  SpO2:  94% 90%   Weight:      Height:        Wt Readings from Last 3 Encounters:  11/17/23 71 kg  11/07/23 72.1 kg  10/05/23 73.6 kg     Intake/Output Summary (Last 24 hours) at 11/26/2023 1656 Last data filed at 11/26/2023 0900 Gross per 24 hour  Intake --  Output 1900 ml  Net -1900 ml     Physical Exam  Awake Alert, frail, deconditioned Symmetrical Chest wall movement, Good air movement bilaterally, CTAB RRR,No Gallops,Rubs or new Murmurs, No Parasternal Heave +ve B.Sounds, Abd Soft, No tenderness, No rebound - guarding or rigidity. No Cyanosis, Clubbing or edema, No new Rash or bruise          Data Review:    Recent Labs  Lab 11/21/23 0415 11/22/23 0357 11/23/23 0433 11/24/23 0445 11/25/23 0412 11/26/23 0335  WBC 4.2 5.0 7.8 5.3 6.6 9.9  HGB 8.7* 9.1* 9.3* 8.8* 8.5* 8.1*  HCT 28.1* 28.2* 29.3* 28.2* 26.6* 25.5*  PLT 191 219 256 253 248 242  MCV 86.2 83.2 84.4 84.9 83.9 84.2  MCH 26.7 26.8 26.8 26.5 26.8 26.7  MCHC 31.0 32.3 31.7 31.2 32.0 31.8  RDW 16.4* 16.6* 16.5* 16.5* 16.1* 16.0*  LYMPHSABS 0.6* 0.5* 0.6*  --  0.5* 0.4*  MONOABS 0.7 0.6 0.7  --  0.8 1.0  EOSABS 0.2 0.1 0.1  --  0.2 0.1  BASOSABS 0.0 0.0 0.0  --  0.0 0.0    Recent Labs  Lab 11/20/23 0416 11/21/23 0415 11/22/23 0357 11/23/23 0433 11/24/23 0422 11/24/23 1050 11/25/23 0412 11/26/23 0335 11/26/23 0656  NA 143 140 144 146* 140  --  137 138  --   K 3.4* 4.0 4.0 4.3 3.9  --  4.0 3.5  --   CL 109 111 110 110 107  --  106 102  --   CO2 20* 18* 24 23 22   --  23 23  --   ANIONGAP 14 11 10 13 11   --  8 13  --   GLUCOSE 114* 124* 125* 108* 101*  --  104*  103*  --   BUN 81* 73* 56* 48* 46*  --  42* 32*  --   CREATININE 2.73* 2.38* 2.11* 2.15* 2.05*  --  2.00* 1.81*  --   INR  --   --   --   --   --  1.3*  --   --   --   MG 2.0 1.8 1.7 1.6* 2.3  --  2.0 1.8  --   PHOS 3.6 2.9 3.4 3.6  --   --   --   --  4.6  CALCIUM  8.5* 8.5* 8.9 9.1 8.8*  --  8.7* 8.8*  --       Recent Labs  Lab 11/22/23 0357 11/23/23 0433 11/24/23 0422 11/24/23 1050 11/25/23 0412 11/26/23 0335  INR  --   --   --  1.3*  --   --   MG 1.7 1.6* 2.3  --  2.0 1.8  CALCIUM  8.9 9.1 8.8*  --  8.7* 8.8*    --------------------------------------------------------------------------------------------------------------- Lab Results  Component Value Date   CHOL 121 04/08/2022   HDL 27 (L) 04/08/2022   LDLCALC 63 04/08/2022   TRIG 267 (H) 04/08/2022   CHOLHDL 4.5 04/08/2022    Lab Results  Component Value Date   HGBA1C 5.9 (H) 11/18/2023      Micro Results No results found for this or any previous visit (from the past 240 hours).  Radiology Report DG Swallowing Func-Speech Pathology Result Date: 11/25/2023 Table formatting from the original result was not included. Modified Barium Swallow Study Patient Details Name: June Vacha MRN: [id number] Date of Birth: 01/11/1941 Today's Date: 11/25/2023 HPI/PMH: HPI: The pt is an 83 yo male presenting 9/9 after being found on ground by family. Admitted for management of rhabdomyolysis and possible UTI. PMH includes: HTN, HLD, CVA, DM II, tobacco use, BPH, and dementia. BSE 9/9 impulsive, no s/s aspiration. F/u 9/12 no s/s aspiration, rec Dys 3/thin and signed off. Coughing with thin during PT session 9/15 and swallow re-eval ordered. 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 Bulky anterior  osteophytes. Multilevel degenerative disc space narrowing. Fusion of  the facets at multiple levels with facet  degenerative change. Clinical Impression: Patient presents with elements of pharyngeal dysphagia exacerbated by structural components. Patients oral phase is characterized by oral residuals, requiring occasional additional swallows to clear oral cavity. Majority of deficits are noted pharyngeally where patient achieves minimal epiglottic inversion and pharyngeal stasis due to chronic structural abnormalities. These deficits resulted in severe vallecular and pyriform sinus residuals as well as gross audible aspiration of large volumes of thin liquids. Patient with eventual silent aspiration of nectar and honey thick liquids residuals, though unable to differenciate consistency due to amount of pharyngeal residuals. SLP attempted use of chin tuck and effortful swallow, though these compensatory strategies were minimally effective and patient cognitively unable to utilize independently. Pharyngeal residuals were reduced after multiple dry swallows, however not eliminated. Per MD, no recent ethiologies that would result in overall change in swallow function and therefore patients dysphagia is likely due to chronic structural issues. Patient with [facility] WBC, vital signs and CXR. SLP to speak with family regarding safest swallow recommendations vs continuation of current diet. Factors that may increase risk of adverse event in presence of aspiration [name] 2021): Factors that may increase risk of adverse event in presence of aspiration [name] 2021): Reduced cognitive function; Aspiration of thick, dense, and/or acidic materials; Frequent aspiration of large volumes; Frail or deconditioned Recommendations/Plan: Swallowing Evaluation Recommendations Swallowing Evaluation Recommendations Supervision: Full supervision/cueing for swallowing strategies Swallowing strategies  : Minimize environmental distractions; Slow rate; Small bites/sips; effortful swallow; Multiple dry swallows after each bite/sip  Postural changes: Position pt fully upright for meals; Stay upright 30-60 min after meals Oral care recommendations: Oral care QID (4x/day) Treatment Plan Treatment Plan Treatment recommendations: Therapy as outlined in treatment plan below Follow-up recommendations: Skilled nursing-short term rehab (<3 hours/day) Functional status assessment: Patient  has had a recent decline in their functional status and/or demonstrates limited ability to make significant improvements in function in a reasonable and predictable amount of time. Treatment frequency: Min 2x/week Treatment duration: 2 weeks Interventions: Aspiration precaution training; Oropharyngeal exercises; Compensatory techniques; Patient/family education; Diet toleration management by SLP Recommendations Recommendations for follow up therapy are one component of a multi-disciplinary discharge planning process, led by the attending physician.  Recommendations may be updated based on patient status, additional functional criteria and insurance authorization. Assessment: Orofacial Exam: Orofacial Exam Oral Cavity - Dentition: Adequate natural dentition Oral Motor/Sensory Function: WFL Anatomy: Anatomy: Suspected cervical osteophytes; Other (Comment) (Bulky anterior osteophytes. Multilevel degenerative disc space narrowing. Severe bilateral foraminal narrowing C3-C4 C6-C7C7-T1. Large posterior disc osteophyte at C3-C4 results in severe canal stenosis.) Boluses Administered: Boluses Administered Boluses Administered: Thin liquids (Level 0); Mildly thick liquids (Level 2, nectar thick); Moderately thick liquids (Level 3, honey thick); Puree  Oral Impairment Domain: Oral Impairment Domain Lip Closure: No labial escape Tongue control during bolus hold: Cohesive bolus between tongue to palatal seal Bolus transport/lingual motion: Repetitive/disorganized tongue motion Oral residue: Residue collection on oral structures Initiation of pharyngeal swallow : Pyriform sinuses   Pharyngeal Impairment Domain: Pharyngeal Impairment Domain Soft palate elevation: Trace column of contrast or air between SP and PW Laryngeal elevation: Partial superior movement of thyroid  cartilage/partial approximation of arytenoids to epiglottic petiole Anterior hyoid excursion: Partial anterior movement Epiglottic movement: -- (limited inversion due to structure) Laryngeal vestibule closure: Incomplete, narrow column air/contrast in laryngeal vestibule Pharyngeal stripping wave : Present - diminished Pharyngeal contraction (A/P view only): N/A Pharyngoesophageal segment opening: Partial distention/partial duration, partial obstruction of flow Tongue base retraction: Narrow column of contrast or air between tongue base and PPW Pharyngeal residue: Majority of contrast within or on pharyngeal structures Location of pharyngeal residue: Diffuse (>3 areas); Pyriform sinuses; Valleculae; Tongue base  Esophageal Impairment Domain: No data recorded Pill: No data recorded Penetration/Aspiration Scale Score: Penetration/Aspiration Scale Score 1.  Material does not enter airway: Puree 8.  Material enters airway, passes BELOW cords without attempt by patient to eject out (silent aspiration) : Thin liquids (Level 0); Mildly thick liquids (Level 2, nectar thick); Moderately thick liquids (Level 3, honey thick) Compensatory Strategies: Compensatory Strategies Compensatory strategies: Yes Effortful swallow: Ineffective Multiple swallows: -- (minimally effective) Chin tuck: Ineffective Liquid wash: Ineffective   General Information: Caregiver present: No  Diet Prior to this Study: Dysphagia 3 (mechanical soft); Thin liquids (Level 0)   Temperature : Normal   Respiratory Status: WFL   Supplemental O2: None (Room air)   No data recorded Behavior/Cognition: Alert; Cooperative; Pleasant mood No data recorded No data recorded No data recorded Volitional Swallow: Able to elicit Exam Limitations: No limitations Goal Planning: Prognosis  for improved oropharyngeal function: Guarded No data recorded Barriers/Prognosis Comment: structural abnormalities Patient/Family Stated Goal: eat/drink Consulted and agree with results and recommendations: Patient Pain: Pain Assessment Pain Assessment: Faces Faces Pain Scale: 6 Pain Location: pt reports everywhere Pain Descriptors / Indicators: Discomfort; Grimacing; Guarding; Moaning Pain Intervention(s): Limited activity within patient's tolerance; Monitored during session; Repositioned End of Session: Start Time:SLP Start Time (ACUTE ONLY): 1230 Stop Time: SLP Stop Time (ACUTE ONLY): 1300 Time Calculation:SLP Time Calculation (min) (ACUTE ONLY): 30 min Charges: SLP Evaluations $ SLP Speech Visit: 1 Visit SLP Evaluations $BSS Swallow: 1 Procedure $MBS Swallow: 1 Procedure SLP visit diagnosis: SLP Visit Diagnosis: Dysphagia, unspecified (R13.10) Past Medical History: Past Medical History: Diagnosis Date  At low risk for fall  Cancer (HCC)   prostrate ca, radiation treatments  Chest pain   COPD (chronic obstructive pulmonary disease) (HCC)   Dementia (HCC)   Diabetes mellitus without complication (HCC)   Dizziness   History of tobacco abuse   Hyperlipidemia   Hypertension   Stroke [facility]   Subconjunctival hemorrhage of left eye 07/18/2019  The condition of the involved eye is that of a subconjunctival hemorrhage.  These are often spontaneous but are also often at or near the site of low location of an injection should to be placed into the eye.  In the absence of direct blunt or severe trauma these will typically resolve on their own spontaneously and have no impact on the vision.    These are very similar to having a bruise in your Past Surgical History: Past Surgical History: Procedure Laterality Date  CHOLECYSTECTOMY N/A 01/30/2014  Procedure: LAPAROSCOPIC CHOLECYSTECTOMY;  Surgeon: [name], MD;  Location: [facility] OR;  Service: General;  Laterality: N/A; Cassidi F Sockwell 11/25/2023, 3:00 PM      Signature  -   Brayton Lye M.D on 11/26/2023 at 4:56 PM   -  To page go to [email or web address]

## 2023-11-26 NOTE — Progress Notes (Signed)
 Subjective: Peter Mann. Clear urine this am  Objective: Vital signs in last 24 hours: Temp:  [97.5 F (36.4 C)-98.5 F (36.9 C)] 98.3 F (36.8 C) (09/18 0741) Pulse Rate:  [75-85] 75 (09/18 0741) Resp:  [16-21] 16 (09/18 0741) BP: (129-162)/(61-76) 162/61 (09/18 0741) SpO2:  [94 %-97 %] 94 % (09/18 0741)  Assessment/Plan: #gross hematuria Clear since yesterday. TOV today with q3 bladder scans. Replace catheter with 14f coude if retained volume exceeds .   Intake/Output from previous day: 09/17 0701 - 09/18 0700 In: -  Out: 2050 [Urine:2050]  Intake/Output this shift: Total I/O In: -  Out: 400 [Urine:400]  Physical Exam:  General: Alert and oriented CV: No cyanosis Lungs: equal chest rise Abdomen: Soft, NTND, no rebound or guarding Gu: foley in place draining clear yellow urine  Lab Results: Recent Labs    11/24/23 0445 11/25/23 0412 11/26/23 0335  HGB 8.8* 8.5* 8.1*  HCT 28.2* 26.6* 25.5*   BMET Recent Labs    11/25/23 0412 11/26/23 0335  NA 137 138  K 4.0 3.5  CL 106 102  CO2 23 23  GLUCOSE 104* 103*  BUN 42* 32*  CREATININE 2.00* 1.81*  CALCIUM  8.7* 8.8*  HGB 8.5* 8.1*  WBC 6.6 9.9     Studies/Results: DG Swallowing Func-Speech Pathology Result Date: 11/25/2023 Table formatting from the original result was not included. Modified Barium Swallow Study Patient Details Name: Loudon Krakow MRN: [id number] Date of Birth: [birth year] Today's Date: 11/25/2023 HPI/PMH: HPI: The pt is an 83 yo male presenting 9/9 after being found on ground by family. Admitted for management of rhabdomyolysis and possible UTI. PMH includes: HTN, HLD, CVA, DM II, tobacco use, BPH, and dementia. BSE 9/9 impulsive, no s/s aspiration. F/u 9/12 no s/s aspiration, rec Dys 3/thin and signed off. Coughing with thin during PT session 9/15 and swallow re-eval ordered. 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 Bulky anterior  osteophytes. Multilevel degenerative disc space narrowing. Fusion of  the facets at multiple levels with facet degenerative change. Clinical Impression: Patient presents with elements of pharyngeal dysphagia exacerbated by structural components. Patients oral phase is characterized by oral residuals, requiring occasional additional swallows to clear oral cavity. Majority of deficits are noted pharyngeally where patient achieves minimal epiglottic inversion and pharyngeal stasis due to chronic structural abnormalities. These deficits resulted in severe vallecular and pyriform sinus residuals as well as gross audible aspiration of large volumes of thin liquids. Patient with eventual silent aspiration of nectar and honey thick liquids residuals, though unable to differenciate consistency due to amount of pharyngeal residuals. SLP attempted use of chin tuck and effortful swallow, though these compensatory strategies were minimally effective and patient cognitively unable to utilize independently. Pharyngeal residuals were reduced after multiple dry swallows, however not eliminated. Per MD, no recent ethiologies that would result in overall change in swallow function and therefore patients dysphagia is likely due to chronic structural issues. Patient with [facility] WBC, vital signs and CXR. SLP to speak with family regarding safest swallow recommendations vs continuation of current diet. Factors that may increase risk of adverse event in presence of aspiration [name] 2021): Factors that may increase risk of adverse event in presence of aspiration [name] 2021): Reduced cognitive function; Aspiration of thick, dense, and/or acidic materials; Frequent aspiration of large volumes; Frail or deconditioned Recommendations/Plan: Swallowing Evaluation Recommendations Swallowing Evaluation Recommendations Supervision:  Full supervision/cueing for  swallowing strategies Swallowing strategies  : Minimize environmental distractions; Slow rate; Small bites/sips; effortful swallow; Multiple dry swallows after each bite/sip Postural changes: Position pt fully upright for meals; Stay upright 30-60 min after meals Oral care recommendations: Oral care QID (4x/day) Treatment Plan Treatment Plan Treatment recommendations: Therapy as outlined in treatment plan below Follow-up recommendations: Skilled nursing-short term rehab (<3 hours/day) Functional status assessment: Patient has had a recent decline in their functional status and/or demonstrates limited ability to make significant improvements in function in a reasonable and predictable amount of time. Treatment frequency: Min 2x/week Treatment duration: 2 weeks Interventions: Aspiration precaution training; Oropharyngeal exercises; Compensatory techniques; Patient/family education; Diet toleration management by SLP Recommendations Recommendations for follow up therapy are one component of a multi-disciplinary discharge planning process, led by the attending physician.  Recommendations may be updated based on patient status, additional functional criteria and insurance authorization. Assessment: Orofacial Exam: Orofacial Exam Oral Cavity - Dentition: Adequate natural dentition Oral Motor/Sensory Function: WFL Anatomy: Anatomy: Suspected cervical osteophytes; Other (Comment) (Bulky anterior osteophytes. Multilevel degenerative disc space narrowing. Severe bilateral foraminal narrowing C3-C4 C6-C7C7-T1. Large posterior disc osteophyte at C3-C4 results in severe canal stenosis.) Boluses Administered: Boluses Administered Boluses Administered: Thin liquids (Level 0); Mildly thick liquids (Level 2, nectar thick); Moderately thick liquids (Level 3, honey thick); Puree  Oral Impairment Domain: Oral Impairment Domain Lip Closure: No labial escape Tongue control during bolus hold: Cohesive bolus between tongue to palatal seal  Bolus transport/lingual motion: Repetitive/disorganized tongue motion Oral residue: Residue collection on oral structures Initiation of pharyngeal swallow : Pyriform sinuses  Pharyngeal Impairment Domain: Pharyngeal Impairment Domain Soft palate elevation: Trace column of contrast or air between SP and PW Laryngeal elevation: Partial superior movement of thyroid  cartilage/partial approximation of arytenoids to epiglottic petiole Anterior hyoid excursion: Partial anterior movement Epiglottic movement: -- (limited inversion due to structure) Laryngeal vestibule closure: Incomplete, narrow column air/contrast in laryngeal vestibule Pharyngeal stripping wave : Present - diminished Pharyngeal contraction (A/P view only): N/A Pharyngoesophageal segment opening: Partial distention/partial duration, partial obstruction of flow Tongue base retraction: Narrow column of contrast or air between tongue base and PPW Pharyngeal residue: Majority of contrast within or on pharyngeal structures Location of pharyngeal residue: Diffuse (>3 areas); Pyriform sinuses; Valleculae; Tongue base  Esophageal Impairment Domain: No data recorded Pill: No data recorded Penetration/Aspiration Scale Score: Penetration/Aspiration Scale Score 1.  Material does not enter airway: Puree 8.  Material enters airway, passes BELOW cords without attempt by patient to eject out (silent aspiration) : Thin liquids (Level 0); Mildly thick liquids (Level 2, nectar thick); Moderately thick liquids (Level 3, honey thick) Compensatory Strategies: Compensatory Strategies Compensatory strategies: Yes Effortful swallow: Ineffective Multiple swallows: -- (minimally effective) Chin tuck: Ineffective Liquid wash: Ineffective   General Information: Caregiver present: No  Diet Prior to this Study: Dysphagia 3 (mechanical soft); Thin liquids (Level 0)   Temperature : Normal   Respiratory Status: WFL   Supplemental O2: None (Room air)   No data recorded Behavior/Cognition:  Alert; Cooperative; Pleasant mood No data recorded No data recorded No data recorded Volitional Swallow: Able to elicit Exam Limitations: No limitations Goal Planning: Prognosis for improved oropharyngeal function: Guarded No data recorded Barriers/Prognosis Comment: structural abnormalities Patient/Family Stated Goal: eat/drink Consulted and agree with results and recommendations: Patient Pain: Pain Assessment Pain Assessment: Faces Faces Pain Scale: 6 Pain Location: pt reports everywhere Pain Descriptors / Indicators: Discomfort; Grimacing; Guarding; Moaning Pain Intervention(s): Limited activity within patient's tolerance; Monitored during session;  Repositioned End of Session: Start Time:SLP Start Time (ACUTE ONLY): 1230 Stop Time: SLP Stop Time (ACUTE ONLY): 1300 Time Calculation:SLP Time Calculation (min) (ACUTE ONLY): 30 min Charges: SLP Evaluations $ SLP Speech Visit: 1 Visit SLP Evaluations $BSS Swallow: 1 Procedure $MBS Swallow: 1 Procedure SLP visit diagnosis: SLP Visit Diagnosis: Dysphagia, unspecified (R13.10) Past Medical History: Past Medical History: Diagnosis Date  At low risk for fall   Cancer (HCC)   prostrate ca, radiation treatments  Chest pain   COPD (chronic obstructive pulmonary disease) (HCC)   Dementia (HCC)   Diabetes mellitus without complication (HCC)   Dizziness   History of tobacco abuse   Hyperlipidemia   Hypertension   Stroke [facility])   Subconjunctival hemorrhage of left eye 07/18/2019  The condition of the involved eye is that of a subconjunctival hemorrhage.  These are often spontaneous but are also often at or near the site of low location of an injection should to be placed into the eye.  In the absence of direct blunt or severe trauma these will typically resolve on their own spontaneously and have no impact on the vision.    These are very similar to having a bruise in your Past Surgical History: Past Surgical History: Procedure Laterality Date  CHOLECYSTECTOMY N/A 01/30/2014   Procedure: LAPAROSCOPIC CHOLECYSTECTOMY;  Surgeon: [name], MD;  Location: [facility] OR;  Service: General;  Laterality: N/A; Cassidi F Sockwell 11/25/2023, 3:00 PM  US  RENAL Result Date: 11/24/2023 CLINICAL DATA:  Hematuria. EXAM: RENAL / URINARY TRACT ULTRASOUND COMPLETE COMPARISON:  August 27, 2015. FINDINGS: Right Kidney: Renal measurements: 11.6 x 5.0 x 4.8 cm = volume: 146 mL. Echogenicity within normal limits. No mass or hydronephrosis visualized. Left Kidney: Renal measurements: 11.3 x 4.8 x 5.9 cm = volume: 167 mL. Echogenicity within normal limits. No mass or hydronephrosis visualized. Bladder: Large amount of heterogeneous material is noted in the dependent portion of urinary bladder most consistent with debris or possibly hemorrhage. Cystoscopy is recommended for further evaluation. Other: None. IMPRESSION: 1. Large amount of heterogeneous material is noted in the dependent portion of urinary bladder most consistent with debris or possibly hemorrhage. Cystoscopy is recommended to evaluate for possible mass. 2. No hydronephrosis or renal obstruction is noted. Electronically Signed   By: Lynwood Landy Raddle M.D.   On: 11/24/2023 12:20      LOS: 8 days   Ole Bourdon, NP [facility] Pager: [phone]  11/26/2023, 10:32 AM

## 2023-11-26 NOTE — Progress Notes (Signed)
 Speech Language Pathology Treatment: Dysphagia  Patient Details Name: Peter Mann MRN: [id number] DOB: 1940-07-01 Today's Date: 11/26/2023 Time: 9081-9068 SLP Time Calculation (min) (ACUTE ONLY): 13 min  Assessment / Plan / Recommendation Clinical Impression  Reviewed MBS results with pt and recommendations as he was unable to recall from yesterday. See MBS results- daughter wished to continue PO's thin and downgrade to Dys 2. Recommendations are swallow x 2 due to pharyngeal residue and clear throat intermittently and signed placed on wall in pt's view.  Observed with simulated D2 texture (graham cracker in applesauce) and thin liquids via straw. He had immediate throat clears x 2 difficult to determine if reflexive or volitional. Several times he stated he cleared throat due to I felt like I needed to then due to strategy. Recommend continue Dys 2, thin liquids, pills whole in puree and needs full supervision to follow strategies. Pt will likely continue to have chronic penetration/aspiration given cervical abnormalities and currently is not showing signs of respiratory impairments. He is at high risk in the future for respiratory compromise if he has decreased medical status with reduced reserve. ST will continue to see.    HPI HPI: The pt is an 83 yo male presenting 9/9 after being found on ground by family. Admitted for management of rhabdomyolysis and possible UTI. PMH includes: HTN, HLD, CVA, DM II, tobacco use, BPH, and dementia. BSE 9/9 impulsive, no s/s aspiration. F/u 9/12 no s/s aspiration, rec Dys 3/thin and signed off. Coughing with thin during PT session 9/15 and swallow re-eval ordered. 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 Bulky anterior  osteophytes. Multilevel degenerative disc space narrowing. Fusion of  the facets at multiple levels with facet  degenerative change.      SLP Plan  Continue with current plan of care          Recommendations  Diet recommendations: Dysphagia 2 (fine chop);Thin liquid Liquids provided via: Cup;Straw Medication Administration: Whole meds with puree Supervision: Full supervision/cueing for compensatory strategies Compensations: Slow rate;Small sips/bites;Multiple dry swallows after each bite/sip;Clear throat intermittently Postural Changes and/or Swallow Maneuvers: Seated upright 90 degrees                  Oral care BID   Frequent or constant Supervision/Assistance Dysphagia, unspecified (R13.10)     Continue with current plan of care     [name]  11/26/2023, 9:44 AM

## 2023-11-27 DIAGNOSIS — N179 Acute kidney failure, unspecified: Secondary | ICD-10-CM | POA: Diagnosis not present

## 2023-11-27 LAB — CBC WITH DIFFERENTIAL/PLATELET
Abs Immature Granulocytes: 0.03 K/uL (ref 0.00–0.07)
Basophils Absolute: 0 K/uL (ref 0.0–0.1)
Basophils Relative: 0 %
Eosinophils Absolute: 0.2 K/uL (ref 0.0–0.5)
Eosinophils Relative: 2 %
HCT: 26.2 % — ABNORMAL LOW (ref 39.0–52.0)
Hemoglobin: 8.2 g/dL — ABNORMAL LOW (ref 13.0–17.0)
Immature Granulocytes: 1 %
Lymphocytes Relative: 9 %
Lymphs Abs: 0.6 K/uL — ABNORMAL LOW (ref 0.7–4.0)
MCH: 26.5 pg (ref 26.0–34.0)
MCHC: 31.3 g/dL (ref 30.0–36.0)
MCV: 84.8 fL (ref 80.0–100.0)
Monocytes Absolute: 0.7 K/uL (ref 0.1–1.0)
Monocytes Relative: 10 %
Neutro Abs: 5.1 K/uL (ref 1.7–7.7)
Neutrophils Relative %: 78 %
Platelets: 284 K/uL (ref 150–400)
RBC: 3.09 MIL/uL — ABNORMAL LOW (ref 4.22–5.81)
RDW: 15.9 % — ABNORMAL HIGH (ref 11.5–15.5)
WBC: 6.5 K/uL (ref 4.0–10.5)
nRBC: 0 % (ref 0.0–0.2)

## 2023-11-27 LAB — BASIC METABOLIC PANEL WITH GFR
Anion gap: 12 (ref 5–15)
BUN: 35 mg/dL — ABNORMAL HIGH (ref 8–23)
CO2: 24 mmol/L (ref 22–32)
Calcium: 8.8 mg/dL — ABNORMAL LOW (ref 8.9–10.3)
Chloride: 104 mmol/L (ref 98–111)
Creatinine, Ser: 1.8 mg/dL — ABNORMAL HIGH (ref 0.61–1.24)
GFR, Estimated: 37 mL/min — ABNORMAL LOW (ref >60)
Glucose, Bld: 123 mg/dL — ABNORMAL HIGH (ref 70–99)
Potassium: 4 mmol/L (ref 3.5–5.1)
Sodium: 140 mmol/L (ref 135–145)

## 2023-11-27 LAB — GLUCOSE, CAPILLARY
Glucose-Capillary: 140 mg/dL — ABNORMAL HIGH (ref 70–99)
Glucose-Capillary: 157 mg/dL — ABNORMAL HIGH (ref 70–99)

## 2023-11-27 LAB — PHOSPHORUS: Phosphorus: 4.3 mg/dL (ref 2.5–4.6)

## 2023-11-27 LAB — MAGNESIUM: Magnesium: 1.9 mg/dL (ref 1.7–2.4)

## 2023-11-27 MED ORDER — DOCUSATE SODIUM 100 MG PO CAPS: 200.0000 mg | ORAL_CAPSULE | Freq: Two times a day (BID) | ORAL | Status: AC

## 2023-11-27 MED ORDER — ASPIRIN 81 MG PO TBEC: 81.0000 mg | DELAYED_RELEASE_TABLET | Freq: Every day | ORAL | Status: AC

## 2023-11-27 MED ORDER — INSULIN ASPART 100 UNIT/ML IJ SOLN: 0.0000 [IU] | Freq: Three times a day (TID) | INTRAMUSCULAR | Status: AC

## 2023-11-27 MED ORDER — HYDRALAZINE HCL 50 MG PO TABS: 50.0000 mg | ORAL_TABLET | Freq: Three times a day (TID) | ORAL | Status: AC

## 2023-11-27 MED ORDER — FOLIC ACID 1 MG PO TABS: 1.0000 mg | ORAL_TABLET | Freq: Every day | ORAL | Status: AC

## 2023-11-27 MED ORDER — CARVEDILOL 6.25 MG PO TABS: 6.2500 mg | ORAL_TABLET | Freq: Two times a day (BID) | ORAL | Status: AC

## 2023-11-27 MED ORDER — FERROUS SULFATE 325 (65 FE) MG PO TABS: 325.0000 mg | ORAL_TABLET | Freq: Two times a day (BID) | ORAL | Status: AC

## 2023-11-27 MED ORDER — OFLOXACIN 0.3 % OP SOLN: 1.0000 [drp] | OPHTHALMIC | Status: AC

## 2023-11-27 NOTE — Discharge Instructions (Signed)
 Follow with Primary MD [name], MD /SNF physician  Get CBC, CMP,  checked  by Primary MD next visit.    Activity: As tolerated with Full fall precautions use walker/cane & assistance as needed   Disposition SNF   Diet: Dysphagia 2 with thin liquid, fall aspiration precaution   On your next visit with your primary care physician please Get Medicines reviewed and adjusted.   Please request your Prim.MD to go over all Hospital Tests and Procedure/Radiological results at the follow up, please get all Hospital records sent to your Prim MD by signing hospital release before you go home.   If you experience worsening of your admission symptoms, develop shortness of breath, life threatening emergency, suicidal or homicidal thoughts you must seek medical attention immediately by calling 911 or calling your MD immediately  if symptoms less severe.  You Must read complete instructions/literature along with all the possible adverse reactions/side effects for all the Medicines you take and that have been prescribed to you. Take any new Medicines after you have completely understood and accpet all the possible adverse reactions/side effects.   Do not drive, operating heavy machinery, perform activities at heights, swimming or participation in water  activities or provide baby sitting services if your were admitted for syncope or siezures until you have seen by Primary MD or a Neurologist and advised to do so again.  Do not drive when taking Pain medications.    Do not take more than prescribed Pain, Sleep and Anxiety Medications  Special Instructions: If you have smoked or chewed Tobacco  in the last 2 yrs please stop smoking, stop any regular Alcohol   and or any Recreational drug use.  Wear Seat belts while driving.   Please note  You were cared for by a hospitalist during your hospital stay. If you have any questions about your discharge medications or the care you received while you  were in the hospital after you are discharged, you can call the unit and asked to speak with the hospitalist on call if the hospitalist that took care of you is not available. Once you are discharged, your primary care physician will handle any further medical issues. Please note that NO REFILLS for any discharge medications will be authorized once you are discharged, as it is imperative that you return to your primary care physician (or establish a relationship with a primary care physician if you do not have one) for your aftercare needs so that they can reassess your need for medications and monitor your lab values.

## 2023-11-27 NOTE — H&P (Deleted)
 Physician Discharge Summary  Peter Mann FMW:[id number] DOB: 1940/03/12 DOA: 11/17/2023  PCP: [name], MD  Admit date: 11/17/2023 Discharge date: 11/27/2023  Admitted From: (Home) Disposition:  (SNF)  Recommendations for Outpatient Follow-up:  Please obtain BMP/CBC in one week Patient will need outpatient follow-up with urology regarding cystoscopy With known chronic dysphagia, continue with dysphagia to thin liquid diet, always feed with aspiration precaution, sitting up Continue with TLSO brace upon standing up and activity       [facility] [address] second floor [place], [place]  [address] [phone]   Diet recommendation: With known chronic dysphagia, continue with dysphagia to thin liquid diet, always feed with aspiration precaution, sitting up  Brief/Interim Summary:   83 y.o. male with PMH significant for DM2, HTN, HLD, smoking, stroke, dementia, CKD, COPD, recurrent falls, prostate cancer s/p radiation. 9/9, patient was brought to the ED after he was found on the ground.\   Per report, family last saw him 4 days ago and was not able to hear back from him after that.  They sent out a wellness check and EMS found the patient on the floor confused and brought to the ED. in the ER he was diagnosed with dehydration, rhabdomyolysis, AKI and admitted.     AKI on CKD 2.  Baseline creatinine around 1.3, acute metabolic acidosis, fall related rhabdomyolysis. He had multiple falls in the past several months according to the daughter, MRI brain nonacute, MRI C-spine nonacute.  Patient with IV fluids, currently euvolemic, renal function back to baseline    History of C3-C4 spinal stenosis.   - With PT, OT and outpatient follow-up with neurology and neurosurgery .  Acute on chronic mid-low back pain.  - Multiple falls over the last several months at home, CT T and L-spine noted with nonspecific fractures, previous MD discussed with  neurosurgeon Dr. Reyes Budge in detail on 11/19/2023, no urgent surgery needed, TLSO brace, PT OT and pain control.  Encouraged patient to think about SNF due to ongoing multiple falls.  Outpatient follow-up with Dr. Budge postdischarge, no MRI needed.   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 - Foley catheter inserted, he had clear urine for 2 days, so Foley discontinued 9/18, no evidence of retention or hematuria at this point . - Patient will need cystoscopy as an outpatient   Acute metabolic encephalopathy with underlying dementia.  MRI brain nonacute, minimize narcotics.  - Mentation much improved   Chronic dysphagia.   -Seen  by SLP,.  MBS study done, concern for aspiration and dysphagia related to structural issue related to cervical spine disease .  Is a chronic issue, he remains at risk for aspiration.  So aspiration precaution to be taken whenever he is fed, continue dysphagia 2, thin liquid, feed only while sitting up, with aspiration precaution.     Hypertension.  Placed on combination of Norvasc , beta-blocker and Imdur .   Anemia of chronic disease, chronic vitamin B12 deficiency.  Folic acid  deficiency - placed on oral iron and B12 supplementation, as well on folic acid  supplements.   Dehydration with hypernatremia.   - Resolved with IV fluid   Conjunctivitis - Finish another 3 days of ofloxacin    History of stroke.  Continue on aspirin  but will cut down from 324 to 81 mg on 11/24/2023 due to hematuria, continue statin for secondary prevention.  DM type II.  He is on multiple oral agents, but it was held due to his AKI, he was kept on Actos on a scheduled hospital stay where it was well-controlled, so we will continue only with sliding scale on discharge .   Pressure ulcers:  POA Continue with wound care Wound 11/18/23 1748 Pressure Injury Foot Left;Medial (Active)     Wound 11/18/23 1756 Pressure Injury Back Right;Upper (Active)     Wound 11/18/23 1759 Pressure Injury Hip Right (Active)     Wound 11/19/23 1300 Pressure Injury Coccyx Stage 2 -  Partial thickness loss of dermis presenting as a shallow open injury with a red, pink wound bed without slough. (Active)      Discharge Diagnoses:  Principal Problem:   ARF (acute renal failure) (HCC) Active Problems:   Essential hypertension   Hyperlipidemia   CVA (cerebral vascular accident) (HCC)   Diabetes mellitus type 2, noninsulin dependent (HCC)   History of stroke   Anemia   Rhabdomyolysis   Elevated LFTs   Acute renal failure (ARF) (HCC)    Discharge Instructions  Discharge Instructions     Diet - low sodium heart healthy   Complete by: As directed    Discharge instructions   Complete by: As directed    Follow with Primary MD [name], MD /SNF physician  Get CBC, CMP,  checked  by Primary MD next visit.    Activity: As tolerated with Full fall precautions use walker/cane & assistance as needed   Disposition SNF   Diet: Dysphagia 2 with thin liquid, fall aspiration precaution   On your next visit with your primary care physician please Get Medicines reviewed and adjusted.   Please request your Prim.MD to go over all Hospital Tests and Procedure/Radiological results at the follow up, please get all Hospital records sent to your Prim MD by signing hospital release before you go home.   If you experience worsening of your admission symptoms, develop shortness of breath, life threatening emergency, suicidal or homicidal thoughts you must seek medical attention immediately by calling 911 or calling your MD immediately  if symptoms less severe.  You Must read complete instructions/literature along with all the possible adverse reactions/side effects for all the Medicines you take  and that have been prescribed to you. Take any new Medicines after you have completely understood and accpet all the possible adverse reactions/side effects.   Do not drive, operating heavy machinery, perform activities at heights, swimming or participation in water  activities or provide baby sitting services if your were admitted for syncope or siezures until you have seen by Primary MD or a Neurologist and advised to do so again.  Do not drive when taking Pain medications.    Do not take more than prescribed Pain, Sleep and Anxiety Medications  Special Instructions: If you have smoked or chewed Tobacco  in the last 2 yrs please stop smoking, stop any regular Alcohol   and or any Recreational drug use.  Wear Seat belts while driving.   Please note  You were cared for by a hospitalist during your hospital stay. If you have any questions about your discharge medications or the care you received while you were in the hospital after you are discharged, you can call the unit and asked to speak with the hospitalist on call if the hospitalist that took care of you is not available. Once you are discharged, your primary care physician will handle any further medical issues. Please note that NO  REFILLS for any discharge medications will be authorized once you are discharged, as it is imperative that you return to your primary care physician (or establish a relationship with a primary care physician if you do not have one) for your aftercare needs so that they can reassess your need for medications and monitor your lab values.   Discharge wound care:   Complete by: As directed    Wound care  Every 12 hours    Comments: Coccyx Wound cleanse wound with normal saline, pat dry and apply 2 layers of Xeroform yellow guaze over wound, cover with 4x4 Mepilex foam dressing, change out foam dressing every 3 days. BID   Wound care  Until discontinued      Comments: Right Lateral Leg Wound cleanse with normal  saline, pat dry, apply 4x4 Mepilex foam dressing, assess every shift, change foam dressing every 3 days. Right Back wound cleanse with normal saline, pat dry with gauze, and apply 4 x 4 Mepilex foam dressing, assess every shift, change every 3 days. Date all dressings.    Wound care  Every shift      Comments: Right Hip cleanse with normal saline, pat dry and apply Medihoney gel daily, cover with 4 x 4 Mepilex foam dressing. Left great toe; open area. Cleanse with Sage wipes daily, apply socks at all times, off-load heels onto pillows or use protective boots.   Increase activity slowly   Complete by: As directed       Allergies as of 11/27/2023   No Known Allergies      Medication List     STOP taking these medications    acetaminophen  500 MG tablet Commonly known as: TYLENOL    aspirin  325 MG tablet Replaced by: aspirin  EC 81 MG tablet   Jardiance 25 MG Tabs tablet Generic drug: empagliflozin   meclizine  25 MG tablet Commonly known as: ANTIVERT    metFORMIN  500 MG tablet Commonly known as: GLUCOPHAGE    metoprolol  tartrate 25 MG tablet Commonly known as: LOPRESSOR    saxagliptin HCl 5 MG Tabs tablet Commonly known as: ONGLYZA       TAKE these medications    Alphagan  P 0.1 % Soln Generic drug: brimonidine  Place 1 drop into both eyes 2 (two) times daily.   amLODipine  10 MG tablet Commonly known as: NORVASC  Take 10 mg by mouth daily.   aspirin  EC 81 MG tablet Take 1 tablet (81 mg total) by mouth daily. Swallow whole. Start taking on: November 28, 2023 Replaces: aspirin  325 MG tablet   carvedilol  6.25 MG tablet Commonly known as: COREG  Take 1 tablet (6.25 mg total) by mouth 2 (two) times daily with a meal.   Cequa  0.09 % Soln Generic drug: cycloSPORINE  (PF) Place 1 drop into both eyes 2 (two) times daily.   cyanocobalamin 1000 MCG tablet Commonly known as: VITAMIN B12 Take 1 tablet (1,000 mcg total) by mouth daily.   dexlansoprazole 60 MG  capsule Commonly known as: DEXILANT Take 60 mg by mouth daily.   diclofenac sodium 1 % Gel Commonly known as: VOLTAREN Apply 2 g topically as needed (pain).   docusate sodium  100 MG capsule Commonly known as: COLACE Take 2 capsules (200 mg total) by mouth 2 (two) times daily.   ferrous sulfate  325 (65 FE) MG tablet Take 1 tablet (325 mg total) by mouth 2 (two) times daily with a meal.   folic acid  1 MG tablet Commonly known as: FOLVITE  Take 1 tablet (1 mg total) by mouth daily. Start  taking on: November 28, 2023   hydrALAZINE  50 MG tablet Commonly known as: APRESOLINE  Take 1 tablet (50 mg total) by mouth every 8 (eight) hours.   insulin  aspart 100 UNIT/ML injection Commonly known as: novoLOG  Inject 0-9 Units into the skin 3 (three) times daily with meals. insulin  aspart (novoLOG ) injection 0-9 Units  0-9 Units, Subcutaneous, 3 times daily with meals, First dose on Wed 11/18/23 at 0800 Correction coverage: Sensitive (thin, NPO, renal) CBG < 70: Implement Hypoglycemia Standing Orders and refer to Hypoglycemia Standing Orders sidebar report CBG 70 - 120: 0 units CBG 121 - 150: 1 unit CBG 151 - 200: 2 units CBG 201 - 250: 3 units CBG 251 - 300: 5 units CBG 301 - 350: 7 units CBG 351 - 400: 9 units CBG > 400: call MD   isosorbide  mononitrate 30 MG 24 hr tablet Commonly known as: IMDUR  Take 1 tablet (30 mg total) by mouth daily.   Linzess  290 MCG Caps capsule Generic drug: linaclotide  Take 290 mcg by mouth daily.   lubiprostone 24 MCG capsule Commonly known as: AMITIZA Take 24 mcg by mouth 2 (two) times daily with a meal.   ofloxacin  0.3 % ophthalmic solution Commonly known as: OCUFLOX  Place 1 drop into both eyes every 2 (two) hours for 3 days.   polyethylene glycol 17 g packet Commonly known as: MiraLax  Take 17 g by mouth daily as needed for moderate constipation or mild constipation (if no bowel movement in 2 days).   rosuvastatin  20 MG tablet Commonly known as:  CRESTOR  Take 20 mg by mouth daily.   senna-docusate 8.6-50 MG tablet Commonly known as: Senokot-S Take 1 tablet by mouth at bedtime.   tamsulosin  0.4 MG Caps capsule Commonly known as: FLOMAX  Take 0.4 mg by mouth daily.               Discharge Care Instructions  (From admission, onward)           Start     Ordered   11/27/23 0000  Discharge wound care:       Comments: Wound care  Every 12 hours    Comments: Coccyx Wound cleanse wound with normal saline, pat dry and apply 2 layers of Xeroform yellow guaze over wound, cover with 4x4 Mepilex foam dressing, change out foam dressing every 3 days. BID   Wound care  Until discontinued      Comments: Right Lateral Leg Wound cleanse with normal saline, pat dry, apply 4x4 Mepilex foam dressing, assess every shift, change foam dressing every 3 days. Right Back wound cleanse with normal saline, pat dry with gauze, and apply 4 x 4 Mepilex foam dressing, assess every shift, change every 3 days. Date all dressings.    Wound care  Every shift      Comments: Right Hip cleanse with normal saline, pat dry and apply Medihoney gel daily, cover with 4 x 4 Mepilex foam dressing. Left great toe; open area. Cleanse with Sage wipes daily, apply socks at all times, off-load heels onto pillows or use protective boots.   11/27/23 1119            Contact information for after-discharge care     Destination     [facility] [place], [place] .   Service: Skilled Nursing Contact information: [address] [place]  [phone]                    No Known Allergies  Consultations:  Urology   Procedures/Studies: DG Swallowing Func-Speech Pathology Result Date: 11/25/2023 Table formatting from the original result was not included. Modified Barium Swallow Study Patient Details Name: Peter Mann MRN: [id number] Date of Birth: November 14, 1940 Today's Date: 11/25/2023 HPI/PMH: HPI: The pt is an 83 yo male  presenting 9/9 after being found on ground by family. Admitted for management of rhabdomyolysis and possible UTI. PMH includes: HTN, HLD, CVA, DM II, tobacco use, BPH, and dementia. BSE 9/9 impulsive, no s/s aspiration. F/u 9/12 no s/s aspiration, rec Dys 3/thin and signed off. Coughing with thin during PT session 9/15 and swallow re-eval ordered. 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 Bulky anterior  osteophytes. Multilevel degenerative disc space narrowing. Fusion of  the facets at multiple levels with facet degenerative change. Clinical Impression: Patient presents with elements of pharyngeal dysphagia exacerbated by structural components. Patients oral phase is characterized by oral residuals, requiring occasional additional swallows to clear oral cavity. Majority of deficits are noted pharyngeally where patient achieves minimal epiglottic inversion and pharyngeal stasis due to chronic structural abnormalities. These deficits resulted in severe vallecular and pyriform sinus residuals as well as gross audible aspiration of large volumes of thin liquids. Patient with eventual silent aspiration of nectar and honey thick liquids residuals, though unable to differenciate consistency due to amount of pharyngeal residuals. SLP attempted use of chin tuck and effortful swallow, though these compensatory strategies were minimally effective and patient cognitively unable to utilize independently. Pharyngeal residuals were reduced after multiple dry swallows, however not eliminated. Per MD, no recent ethiologies that would result in overall change in swallow function and therefore patients dysphagia is likely due to chronic structural issues. Patient with [facility] WBC, vital signs and CXR. SLP to speak with family regarding safest swallow recommendations vs continuation of current diet. Factors that  may increase risk of adverse event in presence of aspiration [name] 2021): Factors that may increase risk of adverse event in presence of aspiration [name] 2021): Reduced cognitive function; Aspiration of thick, dense, and/or acidic materials; Frequent aspiration of large volumes; Frail or deconditioned Recommendations/Plan: Swallowing Evaluation Recommendations Swallowing Evaluation Recommendations Supervision: Full supervision/cueing for swallowing strategies Swallowing strategies  : Minimize environmental distractions; Slow rate; Small bites/sips; effortful swallow; Multiple dry swallows after each bite/sip Postural changes: Position pt fully upright for meals; Stay upright 30-60 min after meals Oral care recommendations: Oral care QID (4x/day) Treatment Plan Treatment Plan Treatment recommendations: Therapy as outlined in treatment plan below Follow-up recommendations: Skilled nursing-short term rehab (<3 hours/day) Functional status assessment: Patient has had a recent decline in their functional status and/or demonstrates limited ability to make significant improvements in function in a reasonable and predictable amount of time. Treatment frequency: Min 2x/week Treatment duration: 2 weeks Interventions: Aspiration precaution training; Oropharyngeal exercises; Compensatory techniques; Patient/family education; Diet toleration management by SLP Recommendations Recommendations for follow up therapy are one component of a multi-disciplinary discharge planning process, led by the attending physician.  Recommendations may be updated based on patient status, additional functional criteria and insurance authorization. Assessment: Orofacial Exam: Orofacial Exam Oral Cavity - Dentition: Adequate natural dentition Oral Motor/Sensory Function: WFL Anatomy: Anatomy: Suspected cervical osteophytes; Other (Comment) (Bulky anterior osteophytes. Multilevel degenerative disc space narrowing. Severe  bilateral foraminal narrowing C3-C4 C6-C7C7-T1. Large posterior disc osteophyte at C3-C4 results in severe canal stenosis.) Boluses Administered: Boluses Administered Boluses Administered: Thin liquids (Level 0); Mildly thick liquids (  Level 2, nectar thick); Moderately thick liquids (Level 3, honey thick); Puree  Oral Impairment Domain: Oral Impairment Domain Lip Closure: No labial escape Tongue control during bolus hold: Cohesive bolus between tongue to palatal seal Bolus transport/lingual motion: Repetitive/disorganized tongue motion Oral residue: Residue collection on oral structures Initiation of pharyngeal swallow : Pyriform sinuses  Pharyngeal Impairment Domain: Pharyngeal Impairment Domain Soft palate elevation: Trace column of contrast or air between SP and PW Laryngeal elevation: Partial superior movement of thyroid  cartilage/partial approximation of arytenoids to epiglottic petiole Anterior hyoid excursion: Partial anterior movement Epiglottic movement: -- (limited inversion due to structure) Laryngeal vestibule closure: Incomplete, narrow column air/contrast in laryngeal vestibule Pharyngeal stripping wave : Present - diminished Pharyngeal contraction (A/P view only): N/A Pharyngoesophageal segment opening: Partial distention/partial duration, partial obstruction of flow Tongue base retraction: Narrow column of contrast or air between tongue base and PPW Pharyngeal residue: Majority of contrast within or on pharyngeal structures Location of pharyngeal residue: Diffuse (>3 areas); Pyriform sinuses; Valleculae; Tongue base  Esophageal Impairment Domain: No data recorded Pill: No data recorded Penetration/Aspiration Scale Score: Penetration/Aspiration Scale Score 1.  Material does not enter airway: Puree 8.  Material enters airway, passes BELOW cords without attempt by patient to eject out (silent aspiration) : Thin liquids (Level 0); Mildly thick liquids (Level 2, nectar thick); Moderately thick liquids  (Level 3, honey thick) Compensatory Strategies: Compensatory Strategies Compensatory strategies: Yes Effortful swallow: Ineffective Multiple swallows: -- (minimally effective) Chin tuck: Ineffective Liquid wash: Ineffective   General Information: Caregiver present: No  Diet Prior to this Study: Dysphagia 3 (mechanical soft); Thin liquids (Level 0)   Temperature : Normal   Respiratory Status: WFL   Supplemental O2: None (Room air)   No data recorded Behavior/Cognition: Alert; Cooperative; Pleasant mood No data recorded No data recorded No data recorded Volitional Swallow: Able to elicit Exam Limitations: No limitations Goal Planning: Prognosis for improved oropharyngeal function: Guarded No data recorded Barriers/Prognosis Comment: structural abnormalities Patient/Family Stated Goal: eat/drink Consulted and agree with results and recommendations: Patient Pain: Pain Assessment Pain Assessment: Faces Faces Pain Scale: 6 Pain Location: pt reports everywhere Pain Descriptors / Indicators: Discomfort; Grimacing; Guarding; Moaning Pain Intervention(s): Limited activity within patient's tolerance; Monitored during session; Repositioned End of Session: Start Time:SLP Start Time (ACUTE ONLY): 1230 Stop Time: SLP Stop Time (ACUTE ONLY): 1300 Time Calculation:SLP Time Calculation (min) (ACUTE ONLY): 30 min Charges: SLP Evaluations $ SLP Speech Visit: 1 Visit SLP Evaluations $BSS Swallow: 1 Procedure $MBS Swallow: 1 Procedure SLP visit diagnosis: SLP Visit Diagnosis: Dysphagia, unspecified (R13.10) Past Medical History: Past Medical History: Diagnosis Date  At low risk for fall   Cancer (HCC)   prostrate ca, radiation treatments  Chest pain   COPD (chronic obstructive pulmonary disease) (HCC)   Dementia (HCC)   Diabetes mellitus without complication (HCC)   Dizziness   History of tobacco abuse   Hyperlipidemia   Hypertension   Stroke [facility])   Subconjunctival hemorrhage of left eye 07/18/2019  The condition of the involved eye  is that of a subconjunctival hemorrhage.  These are often spontaneous but are also often at or near the site of low location of an injection should to be placed into the eye.  In the absence of direct blunt or severe trauma these will typically resolve on their own spontaneously and have no impact on the vision.    These are very similar to having a bruise in your Past Surgical History: Past Surgical History: Procedure Laterality Date  CHOLECYSTECTOMY N/A 01/30/2014  Procedure: LAPAROSCOPIC CHOLECYSTECTOMY;  Surgeon: [name], MD;  Location[facility];  Service: General;  Laterality: N/A; Cassidi F Sockwell 11/25/2023, 3:00 PM  US  RENAL Result Date: 11/24/2023 CLINICAL DATA:  Hematuria. EXAM: RENAL / URINARY TRACT ULTRASOUND COMPLETE COMPARISON:  August 27, 2015. FINDINGS: Right Kidney: Renal measurements: 11.6 x 5.0 x 4.8 cm = volume: 146 mL. Echogenicity within normal limits. No mass or hydronephrosis visualized. Left Kidney: Renal measurements: 11.3 x 4.8 x 5.9 cm = volume: 167 mL. Echogenicity within normal limits. No mass or hydronephrosis visualized. Bladder: Large amount of heterogeneous material is noted in the dependent portion of urinary bladder most consistent with debris or possibly hemorrhage. Cystoscopy is recommended for further evaluation. Other: None. IMPRESSION: 1. Large amount of heterogeneous material is noted in the dependent portion of urinary bladder most consistent with debris or possibly hemorrhage. Cystoscopy is recommended to evaluate for possible mass. 2. No hydronephrosis or renal obstruction is noted. Electronically Signed   By: Lynwood Landy Raddle M.D.   On: 11/24/2023 12:20   CT LUMBAR SPINE WO CONTRAST Result Date: 11/19/2023 EXAM: CT OF THE LUMBAR SPINE WITHOUT CONTRAST 11/19/2023 12:49:09 PM TECHNIQUE: CT of the lumbar spine was performed without the administration of intravenous contrast. Multiplanar reformatted images are provided for review. Automated exposure control,  iterative reconstruction, and/or weight based adjustment of the mA/kV was utilized to reduce the radiation dose to as low as reasonably achievable. COMPARISON: CT lumbar spine 05/27/2018 and same day CT thoracic spine. CLINICAL HISTORY: Low back pain, increased fracture risk. Chief complaints; Fall; CT THORACIC SPINE WO CONTRAST; Compression fracture, thoracic; CT LUMBAR SPINE WO CONTRAST; Low back pain, increased fracture risk. FINDINGS: BONES AND ALIGNMENT: Similar grade 1 anterolisthesis of L4 on L5, alignment otherwise maintained. Similar appearance of bilateral pars defects at L3. Vertebral body heights are maintained. No evidence of acute compression fracture or displaced fracture in the lumbar spine. No suspicious lytic osseous lesion. DEGENERATIVE CHANGES: Degenerative endplate osteophytes, multiple levels endplate sclerosis most pronounced at L5-S1. Mild-to-moderate disc space narrowing at L5-S1 is similar to prior with associated vacuum disc. At T12-L1 there is no significant spinal canal or foraminal stenosis. At L1-L2 there are small posterior osteophytes and mild facet arthrosis. No significant spinal canal or foraminal stenosis. At L2-3 there is mild mineralization of the disc with small posterior osteophytes, additional prominent lateral osteophytes, particularly on the left, and mild facet arthrosis. There is no significant spinal canal or foraminal stenosis. Prominent lateral osteophytes with potential impingement upon the extraforaminal left L2 nerve root. At L3-4 there is a small disc bulge and posterior osteophytes, mild facet arthrosis, and mild spinal canal stenosis. Moderate bilateral foraminal stenosis, similar to prior. At L4-5, there is anterolisthesis, partial uncovering of the disc, diffuse disc bulge, moderate facet arthrosis, and thickening of the ligaments and flavum. There is moderate spinal canal stenosis, similar to prior. There is moderate-to-severe bilateral foraminal stenosis  which appears slightly increased. At L5-S1 there is a diffuse disc bulge and posterior osteophytes, moderate-to-severe bilateral facet arthrosis. There is mild spinal canal stenosis, moderate-to-severe bilateral foraminal stenosis, slightly increased from prior. SOFT TISSUES: The paraspinal soft tissues are unremarkable. 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 IMPRESSION: 1. No evidence of acute compression fracture or displaced fracture in the lumbar spine. 2. 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 3. Moderate-to-severe bilateral foraminal stenosis at L4-5 and L5-S1, increased from prior. Electronically  signed by: Donnice Mania MD 11/19/2023 01:37 PM EDT RP Workstation: [id number]   CT THORACIC SPINE WO CONTRAST Result Date: 11/19/2023 EXAM: CT THORACIC SPINE WITHOUT CONTRAST 11/19/2023 12:49:09 PM TECHNIQUE: CT of the thoracic spine was performed without the administration of intravenous contrast. Multiplanar reformatted images are provided for review. Automated exposure control, iterative reconstruction, and/or weight based adjustment of the mA/kV was utilized to reduce the radiation dose to as low as reasonably achievable. COMPARISON: CT cervical spine 12/03/2018 and CT lumbar spine 05/27/2018. Thoracic spine radiographs 05/27/2018. CLINICAL HISTORY: Compression fracture, thoracic. Chief complaints; Fall; CT THORACIC SPINE WO CONTRAST; Compression fracture, thoracic; CT LUMBAR SPINE WO CONTRAST; Low back pain, increased fracture risk. 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 There is focal disruption of the osteophytes anteriorly at the T9-10 level with lucency and gas extending through the defect into the anterior aspect of the T9-10 disc. Findings are concerning for  fractured osteophytes. The vertebral body heights are maintained without evidence of compression fracture. Mild dextrocurvature of the thoracic spine centered at T8-9. Thoracic kyphosis is maintained. DEGENERATIVE CHANGES: Degenerative endplate osteophytes and degenerative endplate changes at multiple levels. There is no suspicious lytic osseous lesion. There is partial osseous fusion across the disc spaces at T6-7 through T8-9. SOFT TISSUES: The paraspinal soft tissues are unremarkable. Small left pleural effusion. Trace pleural effusion on the right. VASCULATURE: Atherosclerosis of the coronary arteries. SPINAL CANAL AND FORAMINA: There is no high-grade osseous spinal canal stenosis. No CT evidence of large disc herniation. No high-grade osseous foraminal stenosis. IMPRESSION: 1. No acute compression fracture. 2. Findings concerning for fractured osteophytes at T9-10 with lucency and gas extending into the anterior aspect of the T9-10 disc. Consider MRI for further evaluation. Electronically signed by: Donnice Mania MD 11/19/2023 01:28 PM EDT RP Workstation: [id number]   MR [name] CONTRAST Result Date: 11/18/2023 CLINICAL DATA:  83 year old male with altered mental status. Found down by family. Abnormal labs concerning for rhabdomyolysis and dehydration. EXAM: MRI HEAD WITHOUT CONTRAST TECHNIQUE: Multiplanar, multiecho pulse sequences of the brain and surrounding structures were obtained without intravenous contrast. COMPARISON:  CT head and cervical spine yesterday. Brain MRI 08/23/2015. FINDINGS: Brain: Cerebral volume not significantly changed from 2017 MRI. No restricted diffusion to suggest acute infarction. No midline shift, mass effect, evidence of mass lesion, ventriculomegaly, extra-axial collection or acute intracranial hemorrhage. Cervicomedullary junction and pituitary are within normal limits. Chronic lacunar infarcts right thalamus, bilateral cerebellum appears stable since 2017. Patchy and  scattered additional bilateral cerebral white matter T2 and FLAIR hyperintensity has progressed since that time, moderate for age. No convincing chronic cerebral blood products on SWI. No convincing cortical encephalomalacia. Vascular: Major intracranial vascular flow voids are stable since 2017. 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 Normal background bone marrow signal. Sinuses/Orbits: Postoperative changes to the left globe since 2017. Scattered mild to moderate paranasal sinus mucosal thickening. Other: Mastoids remain well aerated. Trace retained secretions in the nasopharynx. Visible scalp and face appear negative. IMPRESSION: 1. No acute intracranial abnormality. 2. 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 Other moderate for age cerebral white matter disease has progressed since that  time. 3. 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 Spinal stenosis at C3-C4 appears chronic but may have increased from prior 2015, 2017 MRIs. Electronically Signed   By: VEAR Hurst M.D.   On: 11/18/2023 05:22   CT Cervical Spine Wo Contrast Result Date: 11/17/2023 CLINICAL DATA:  Head trauma EXAM: CT HEAD WITHOUT CONTRAST CT CERVICAL SPINE WITHOUT CONTRAST TECHNIQUE: Multidetector CT imaging of the head and cervical spine was performed following the standard protocol without intravenous contrast. Multiplanar CT image reconstructions of the cervical spine were also generated. RADIATION DOSE REDUCTION: This exam was performed according to the departmental dose-optimization program which includes automated exposure control, adjustment of the mA and/or kV according to  patient size and/or use of iterative reconstruction technique. COMPARISON:  CT brain 05/08/2023, MRI 08/23/2015, MRI cervical spine 12/01/2013 FINDINGS: CT HEAD FINDINGS Brain: No acute territorial infarction, hemorrhage or intracranial mass. Moderate advanced atrophy. Moderate chronic small vessel ischemic changes of the white matter. Prominent ventricles felt secondary to atrophy. Vascular: No hyperdense vessels.  Carotid vascular calcification Skull: Normal. Negative for fracture or focal lesion. Sinuses/Orbits: Patchy mucosal thickening Other: None CT CERVICAL SPINE FINDINGS Alignment: Facet alignment is maintained. Straightening of the cervical spine. No subluxation. Skull base and vertebrae: No definitive fracture. Soft tissues and spinal canal: No prevertebral fluid or swelling. No visible canal hematoma. Disc levels: Partial ankylosis C4 through C7. Bulky anterior osteophytes. Multilevel degenerative disc space narrowing. Fusion of the facets at multiple levels with facet degenerative change. Severe bilateral foraminal narrowing C3-C4 and C6-C7 and C7-T1. Large posterior disc osteophyte at C3-C4 results in severe canal stenosis. Relative widening of anterior disc space/osteophytes at C3 C4. Upper chest: Negative. Other: None IMPRESSION: 1. No CT evidence for acute intracranial abnormality. Atrophy and chronic small vessel ischemic changes of the white matter. 2. Straightening of the cervical spine with multilevel degenerative changes. No definitive acute fracture is seen. 3. Relative widening of anterior disc space/osteophytes at C3-C4, of uncertain significance. If there is high clinical suspicion for ligamentous injury, correlation with MRI could be obtained. Electronically Signed   By: Luke Bun M.D.   On: 11/17/2023 22:30   CT Head Wo Contrast Result Date: 11/17/2023 CLINICAL DATA:  Head trauma EXAM: CT HEAD WITHOUT CONTRAST CT CERVICAL SPINE WITHOUT CONTRAST TECHNIQUE: Multidetector CT imaging of  the head and cervical spine was performed following the standard protocol without intravenous contrast. Multiplanar CT image reconstructions of the cervical spine were also generated. RADIATION DOSE REDUCTION: This exam was performed according to the departmental dose-optimization program which includes automated exposure control, adjustment of the mA and/or kV according to patient size and/or use of iterative reconstruction technique. COMPARISON:  CT brain 05/08/2023, MRI 08/23/2015, MRI cervical spine 12/01/2013 FINDINGS: CT HEAD FINDINGS Brain: No acute territorial infarction, hemorrhage or intracranial mass. Moderate advanced atrophy. Moderate chronic small vessel ischemic changes of the white matter. Prominent ventricles felt secondary to atrophy. Vascular: No hyperdense vessels.  Carotid vascular calcification Skull: Normal. Negative for fracture or focal lesion. Sinuses/Orbits: Patchy mucosal thickening Other: None CT CERVICAL SPINE FINDINGS Alignment: Facet alignment is maintained. Straightening of the cervical spine. No subluxation. Skull base and vertebrae: No definitive fracture. Soft tissues and spinal canal: No prevertebral fluid or swelling. No visible canal hematoma. Disc levels: Partial ankylosis C4 through C7. Bulky anterior osteophytes. Multilevel degenerative disc space narrowing. Fusion of the facets at multiple levels with facet degenerative change. Severe bilateral foraminal narrowing C3-C4 and C6-C7 and C7-T1. Large posterior disc osteophyte  at C3-C4 results in severe canal stenosis. Relative widening of anterior disc space/osteophytes at C3 C4. Upper chest: Negative. Other: None IMPRESSION: 1. No CT evidence for acute intracranial abnormality. Atrophy and chronic small vessel ischemic changes of the white matter. 2. Straightening of the cervical spine with multilevel degenerative changes. No definitive acute fracture is seen. 3. Relative widening of anterior disc space/osteophytes at C3-C4,  of uncertain significance. If there is high clinical suspicion for ligamentous injury, correlation with MRI could be obtained. Electronically Signed   By: Luke Bun M.D.   On: 11/17/2023 22:30   DG Chest Portable 1 View Result Date: 11/17/2023 CLINICAL DATA:  [place] EXAM: PORTABLE CHEST 1 VIEW COMPARISON:  05/08/2023 FINDINGS: Single frontal view of the chest demonstrates an unremarkable cardiac silhouette. No airspace disease, effusion, or pneumothorax. No acute bony abnormalities. IMPRESSION: 1. No acute intrathoracic process. Electronically Signed   By: Ozell Daring M.D.   On: 11/17/2023 21:12   DG Pelvis Portable Result Date: 11/17/2023 CLINICAL DATA:  Fall EXAM: PORTABLE PELVIS 1-2 VIEWS COMPARISON:  05/08/2023 FINDINGS: There is no evidence of pelvic fracture or diastasis. No pelvic bone lesions are seen. IMPRESSION: No acute bony abnormality. Electronically Signed   By: Franky Crease M.D.   On: 11/17/2023 21:11      Subjective:  No significant events overnight, he denies any complaints, Foley catheter was discontinued yesterday, no evidence of retention or hematuria Discharge Exam: Vitals:   11/27/23 0021 11/27/23 0415  BP: (!) 143/61 (!) 169/74  Pulse:  77  Resp: 16 16  Temp: 98.2 F (36.8 C) 97.9 F (36.6 C)  SpO2:  100%   Vitals:   11/27/23 0000 11/27/23 0021 11/27/23 0415 11/27/23 0802  BP: (!) 150/69 (!) 143/61 (!) 169/74   Pulse: 72  77   Resp: 16 16 16    Temp:  98.2 F (36.8 C) 97.9 F (36.6 C)   TempSrc:  Oral Oral Oral  SpO2: 99%  100%   Weight:      Height:        General: Pt is alert, awake, not in acute distress, frail, deconditioned Cardiovascular: RRR, S1/S2 +, no rubs, no gallops Respiratory: CTA bilaterally, no wheezing, no rhonchi Abdominal: Soft, NT, ND, bowel sounds + Extremities: no edema, no cyanosis    The results of significant diagnostics from this hospitalization (including imaging, microbiology, ancillary and laboratory) are listed  below for reference.     Microbiology: No results found for this or any previous visit (from the past 240 hours).   Labs: BNP (last 3 results) Recent Labs    05/09/23 0550  BNP 80.0   Basic Metabolic Panel: Recent Labs  Lab 11/21/23 0415 11/22/23 0357 11/23/23 0433 11/24/23 0422 11/25/23 0412 11/26/23 0335 11/26/23 0656 11/27/23 0303  NA 140 144 146* 140 137 138  --  140  K 4.0 4.0 4.3 3.9 4.0 3.5  --  4.0  CL 111 110 110 107 106 102  --  104  CO2 18* 24 23 22 23 23   --  24  GLUCOSE 124* 125* 108* 101* 104* 103*  --  123*  BUN 73* 56* 48* 46* 42* 32*  --  35*  CREATININE 2.38* 2.11* 2.15* 2.05* 2.00* 1.81*  --  1.80*  CALCIUM  8.5* 8.9 9.1 8.8* 8.7* 8.8*  --  8.8*  MG 1.8 1.7 1.6* 2.3 2.0 1.8  --  1.9  PHOS 2.9 3.4 3.6  --   --   --  4.6 4.3  Liver Function Tests: No results for input(s): AST, ALT, ALKPHOS, BILITOT, PROT, ALBUMIN in the last 168 hours. No results for input(s): LIPASE, AMYLASE in the last 168 hours. No results for input(s): AMMONIA in the last 168 hours. CBC: Recent Labs  Lab 11/22/23 0357 11/23/23 0433 11/24/23 0445 11/25/23 0412 11/26/23 0335 11/27/23 0303  WBC 5.0 7.8 5.3 6.6 9.9 6.5  NEUTROABS 3.7 6.2  --  5.1 8.3* 5.1  HGB 9.1* 9.3* 8.8* 8.5* 8.1* 8.2*  HCT 28.2* 29.3* 28.2* 26.6* 25.5* 26.2*  MCV 83.2 84.4 84.9 83.9 84.2 84.8  PLT 219 256 253 248 242 284   Cardiac Enzymes: Recent Labs  Lab 11/21/23 0415 11/22/23 0357 11/23/23 0433  CKTOTAL 118 90 90   BNP: Invalid input(s): POCBNP CBG: Recent Labs  Lab 11/25/23 2100 11/26/23 0808 11/26/23 1226 11/26/23 1535 11/27/23 0806  GLUCAP 115* 103* 138* 148* 140*   D-Dimer No results for input(s): DDIMER in the last 72 hours. Hgb A1c No results for input(s): HGBA1C in the last 72 hours. Lipid Profile No results for input(s): CHOL, HDL, LDLCALC, TRIG, CHOLHDL, LDLDIRECT in the last 72 hours. Thyroid  function studies No results for  input(s): TSH, T4TOTAL, T3FREE, THYROIDAB in the last 72 hours.  Invalid input(s): FREET3 Anemia work up No results for input(s): VITAMINB12, FOLATE, FERRITIN, TIBC, IRON, RETICCTPCT in the last 72 hours. Urinalysis    Component Value Date/Time   COLORURINE RED (A) 11/24/2023 1054   APPEARANCEUR TURBID (A) 11/24/2023 1054   LABSPEC  11/24/2023 1054    TEST NOT REPORTED DUE TO COLOR INTERFERENCE OF URINE PIGMENT   LABSPEC 1.010 01/19/2006 1117   PHURINE  11/24/2023 1054    TEST NOT REPORTED DUE TO COLOR INTERFERENCE OF URINE PIGMENT   GLUCOSEU (A) 11/24/2023 1054    TEST NOT REPORTED DUE TO COLOR INTERFERENCE OF URINE PIGMENT   HGBUR (A) 11/24/2023 1054    TEST NOT REPORTED DUE TO COLOR INTERFERENCE OF URINE PIGMENT   BILIRUBINUR (A) 11/24/2023 1054    TEST NOT REPORTED DUE TO COLOR INTERFERENCE OF URINE PIGMENT   BILIRUBINUR negative 05/07/2023 1045   BILIRUBINUR Negative 01/19/2006 1117   KETONESUR (A) 11/24/2023 1054    TEST NOT REPORTED DUE TO COLOR INTERFERENCE OF URINE PIGMENT   PROTEINUR (A) 11/24/2023 1054    TEST NOT REPORTED DUE TO COLOR INTERFERENCE OF URINE PIGMENT   UROBILINOGEN 1.0 05/07/2023 1045   UROBILINOGEN 0.2 08/31/2020 1208   NITRITE (A) 11/24/2023 1054    TEST NOT REPORTED DUE TO COLOR INTERFERENCE OF URINE PIGMENT   LEUKOCYTESUR (A) 11/24/2023 1054    TEST NOT REPORTED DUE TO COLOR INTERFERENCE OF URINE PIGMENT   LEUKOCYTESUR Negative 01/19/2006 1117   Sepsis Labs Recent Labs  Lab 11/24/23 0445 11/25/23 0412 11/26/23 0335 11/27/23 0303  WBC 5.3 6.6 9.9 6.5   Microbiology No results found for this or any previous visit (from the past 240 hours).   Time coordinating discharge: Over 30 minutes  SIGNED:   Brayton Lye, MD  [facility] 11/27/2023, 11:19 AM Pager   If 7PM-7AM, please contact night-coverage [email or web address]

## 2023-11-27 NOTE — Plan of Care (Signed)
  Problem: Education: Goal: Ability to describe self-care measures that may prevent or decrease complications (Diabetes Survival Skills Education) will improve Outcome: Completed/Met   Problem: Coping: Goal: Ability to adjust to condition or change in health will improve Outcome: Completed/Met   Problem: Fluid Volume: Goal: Ability to maintain a balanced intake and output will improve Outcome: Completed/Met   Problem: Health Behavior/Discharge Planning: Goal: Ability to identify and utilize available resources and services will improve Outcome: Completed/Met Goal: Ability to manage health-related needs will improve Outcome: Completed/Met   Problem: Metabolic: Goal: Ability to maintain appropriate glucose levels will improve Outcome: Completed/Met   Problem: Nutritional: Goal: Maintenance of adequate nutrition will improve Outcome: Completed/Met Goal: Progress toward achieving an optimal weight will improve Outcome: Completed/Met   Problem: Skin Integrity: Goal: Risk for impaired skin integrity will decrease Outcome: Completed/Met   Problem: Tissue Perfusion: Goal: Adequacy of tissue perfusion will improve Outcome: Completed/Met   Problem: Education: Goal: Knowledge of General Education information will improve Description: Including pain rating scale, medication(s)/side effects and non-pharmacologic comfort measures Outcome: Completed/Met   Problem: Health Behavior/Discharge Planning: Goal: Ability to manage health-related needs will improve Outcome: Completed/Met   Problem: Clinical Measurements: Goal: Ability to maintain clinical measurements within normal limits will improve Outcome: Completed/Met Goal: Will remain free from infection Outcome: Completed/Met Goal: Diagnostic test results will improve Outcome: Completed/Met Goal: Respiratory complications will improve Outcome: Completed/Met Goal: Cardiovascular complication will be avoided Outcome:  Completed/Met   Problem: Activity: Goal: Risk for activity intolerance will decrease Outcome: Completed/Met   Problem: Nutrition: Goal: Adequate nutrition will be maintained Outcome: Completed/Met   Problem: Coping: Goal: Level of anxiety will decrease Outcome: Completed/Met   Problem: Elimination: Goal: Will not experience complications related to bowel motility Outcome: Completed/Met Goal: Will not experience complications related to urinary retention Outcome: Completed/Met   Problem: Pain Managment: Goal: General experience of comfort will improve and/or be controlled Outcome: Completed/Met   Problem: Safety: Goal: Ability to remain free from injury will improve Outcome: Completed/Met   Problem: Skin Integrity: Goal: Risk for impaired skin integrity will decrease Outcome: Completed/Met

## 2023-11-27 NOTE — TOC Transition Note (Signed)
 Transition of Care [facility]) - Discharge Note   Patient Details  Name: Peter Mann MRN: [id number] Date of Birth: 03/10/1941  [facility] CM/SW Contact:  Inocente GORMAN Kindle, LCSW Phone Number: 11/27/2023, 1:02 PM   Clinical Narrative:    Patient will DC to: Anticipated DC date: Family notified: Transport by:   Per MD patient ready for DC to [facility]. RN to call report prior to discharge [phone] room 208). RN, patient, patient's family, and facility notified of DC. Discharge Summary and FL2 sent to facility. DC packet on chart. Ambulance transport requested for patient.   CSW will sign off for now as social work intervention is no longer needed. Please consult us  again if new needs arise.     Final next level of care: Skilled Nursing Facility Barriers to Discharge: Barriers Resolved   Patient Goals and CMS Choice Patient states their goals for this hospitalization and ongoing recovery are:: Rehab CMS Medicare.gov Compare Post Acute Care list provided to:: Patient Choice offered to / list presented to : Patient [place] ownership interest in [facility].provided to:: Patient    Discharge Placement   Existing PASRR number confirmed : 11/27/23          Patient [facility] bed at[facility] Patient to be transferred to facility by: [facility] Name of family member notified: Daughter Patient and family notified of of transfer: 11/27/23  Discharge Plan and Services Additional resources added to the After Visit Summary for   In-house Referral: Clinical Social Work   Post Acute Care Choice: Skilled Nursing Facility                               Social Drivers of Health (SDOH) Interventions SDOH Screenings   Food Insecurity: No Food Insecurity (11/18/2023)  Housing: Low Risk  (11/18/2023)  Transportation Needs: No Transportation Needs (11/18/2023)  Utilities: Not At Risk (11/18/2023)  Social Connections: Moderately Integrated  (11/18/2023)  Tobacco Use: Medium Risk (11/18/2023)     Readmission Risk Interventions     No data to display

## 2023-11-27 NOTE — Progress Notes (Addendum)
 Subjective: NAEON. Clear urine this am  Objective: Vital signs in last 24 hours: Temp:  [97.9 F (36.6 C)-98.8 F (37.1 C)] 97.9 F (36.6 C) (09/19 0415) Pulse Rate:  [67-77] 77 (09/19 0415) Resp:  [16-20] 16 (09/19 0415) BP: (110-169)/(47-74) 169/74 (09/19 0415) SpO2:  [90 %-100 %] 100 % (09/19 0415)  Assessment/Plan: #gross hematuria- resolved #CKD  SCr stable Successful TOV 9/18. Replace catheter with 46f coude if retained volume exceeds . Will need outpt cystoscopy. Urology will sign off at this time. Please call with questions.   [facility] [address] [place], [place]  [address] [phone]    Intake/Output from previous day: 09/18 0701 - 09/19 0700 In: -  Out: 900 [Urine:900]  Intake/Output this shift: No intake/output data recorded.  Physical Exam:  General: Alert and oriented CV: No cyanosis Lungs: equal chest rise Abdomen: Soft, NTND, no rebound or guarding Gu: foley out 9/19.  Lab Results: Recent Labs    11/25/23 0412 11/26/23 0335 11/27/23 0303  HGB 8.5* 8.1* 8.2*  HCT 26.6* 25.5* 26.2*   BMET Recent Labs    11/26/23 0335 11/27/23 0303  NA 138 140  K 3.5 4.0  CL 102 104  CO2 23 24  GLUCOSE 103* 123*  BUN 32* 35*  CREATININE 1.81* 1.80*  CALCIUM  8.8* 8.8*  HGB 8.1* 8.2*  WBC 9.9 6.5     Studies/Results: DG Swallowing Func-Speech Pathology Result Date: 11/25/2023 Table formatting from the original result was not included. Modified Barium Swallow Study Patient Details Name: Peter Mann MRN: [id number] Date of Birth: 1940/10/02 Today's Date: 11/25/2023 HPI/PMH: HPI: The pt is an 83 yo male presenting 9/9 after being found on ground by family. Admitted for management of rhabdomyolysis and possible UTI. PMH includes: HTN, HLD, CVA, DM II, tobacco use, BPH, and dementia. BSE 9/9 impulsive, no s/s aspiration. F/u 9/12 no s/s aspiration, rec Dys 3/thin and signed off. Coughing with thin  during PT session 9/15 and swallow re-eval ordered. 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 Bulky anterior  osteophytes. Multilevel degenerative disc space narrowing. Fusion of  the facets at multiple levels with facet degenerative change. Clinical Impression: Patient presents with elements of pharyngeal dysphagia exacerbated by structural components. Patients oral phase is characterized by oral residuals, requiring occasional additional swallows to clear oral cavity. Majority of deficits are noted pharyngeally where patient achieves minimal epiglottic inversion and pharyngeal stasis due to chronic structural abnormalities. These deficits resulted in severe vallecular and pyriform sinus residuals as well as gross audible aspiration of large volumes of thin liquids. Patient with eventual silent aspiration of nectar and honey thick liquids residuals, though unable to differenciate consistency due to amount of pharyngeal residuals. SLP attempted use of chin tuck and effortful swallow, though these compensatory strategies were minimally effective and patient cognitively unable to utilize independently. Pharyngeal residuals were reduced after multiple dry swallows, however not eliminated. Per MD, no recent ethiologies that would result in overall change in swallow function and therefore patients dysphagia is likely due to chronic structural issues. Patient with [facility] WBC, vital signs and CXR. SLP to speak with family regarding safest swallow recommendations vs continuation of current diet. Factors that may increase risk of adverse event in presence of aspiration [name] 2021): Factors that may increase risk of adverse event in presence of aspiration [name] 2021): Reduced cognitive  function; Aspiration of thick, dense, and/or acidic materials; Frequent aspiration of large  volumes; Frail or deconditioned Recommendations/Plan: Swallowing Evaluation Recommendations Swallowing Evaluation Recommendations Supervision: Full supervision/cueing for swallowing strategies Swallowing strategies  : Minimize environmental distractions; Slow rate; Small bites/sips; effortful swallow; Multiple dry swallows after each bite/sip Postural changes: Position pt fully upright for meals; Stay upright 30-60 min after meals Oral care recommendations: Oral care QID (4x/day) Treatment Plan Treatment Plan Treatment recommendations: Therapy as outlined in treatment plan below Follow-up recommendations: Skilled nursing-short term rehab (<3 hours/day) Functional status assessment: Patient has had a recent decline in their functional status and/or demonstrates limited ability to make significant improvements in function in a reasonable and predictable amount of time. Treatment frequency: Min 2x/week Treatment duration: 2 weeks Interventions: Aspiration precaution training; Oropharyngeal exercises; Compensatory techniques; Patient/family education; Diet toleration management by SLP Recommendations Recommendations for follow up therapy are one component of a multi-disciplinary discharge planning process, led by the attending physician.  Recommendations may be updated based on patient status, additional functional criteria and insurance authorization. Assessment: Orofacial Exam: Orofacial Exam Oral Cavity - Dentition: Adequate natural dentition Oral Motor/Sensory Function: WFL Anatomy: Anatomy: Suspected cervical osteophytes; Other (Comment) (Bulky anterior osteophytes. Multilevel degenerative disc space narrowing. Severe bilateral foraminal narrowing C3-C4 C6-C7C7-T1. Large posterior disc osteophyte at C3-C4 results in severe canal stenosis.) Boluses Administered: Boluses Administered Boluses Administered: Thin liquids (Level 0); Mildly thick liquids (Level 2, nectar thick); Moderately thick liquids (Level 3, honey  thick); Puree  Oral Impairment Domain: Oral Impairment Domain Lip Closure: No labial escape Tongue control during bolus hold: Cohesive bolus between tongue to palatal seal Bolus transport/lingual motion: Repetitive/disorganized tongue motion Oral residue: Residue collection on oral structures Initiation of pharyngeal swallow : Pyriform sinuses  Pharyngeal Impairment Domain: Pharyngeal Impairment Domain Soft palate elevation: Trace column of contrast or air between SP and PW Laryngeal elevation: Partial superior movement of thyroid  cartilage/partial approximation of arytenoids to epiglottic petiole Anterior hyoid excursion: Partial anterior movement Epiglottic movement: -- (limited inversion due to structure) Laryngeal vestibule closure: Incomplete, narrow column air/contrast in laryngeal vestibule Pharyngeal stripping wave : Present - diminished Pharyngeal contraction (A/P view only): N/A Pharyngoesophageal segment opening: Partial distention/partial duration, partial obstruction of flow Tongue base retraction: Narrow column of contrast or air between tongue base and PPW Pharyngeal residue: Majority of contrast within or on pharyngeal structures Location of pharyngeal residue: Diffuse (>3 areas); Pyriform sinuses; Valleculae; Tongue base  Esophageal Impairment Domain: No data recorded Pill: No data recorded Penetration/Aspiration Scale Score: Penetration/Aspiration Scale Score 1.  Material does not enter airway: Puree 8.  Material enters airway, passes BELOW cords without attempt by patient to eject out (silent aspiration) : Thin liquids (Level 0); Mildly thick liquids (Level 2, nectar thick); Moderately thick liquids (Level 3, honey thick) Compensatory Strategies: Compensatory Strategies Compensatory strategies: Yes Effortful swallow: Ineffective Multiple swallows: -- (minimally effective) Chin tuck: Ineffective Liquid wash: Ineffective   General Information: Caregiver present: No  Diet Prior to this Study:  Dysphagia 3 (mechanical soft); Thin liquids (Level 0)   Temperature : Normal   Respiratory Status: WFL   Supplemental O2: None (Room air)   No data recorded Behavior/Cognition: Alert; Cooperative; Pleasant mood No data recorded No data recorded No data recorded Volitional Swallow: Able to elicit Exam Limitations: No limitations Goal Planning: Prognosis for improved oropharyngeal function: Guarded No data recorded Barriers/Prognosis Comment: structural abnormalities Patient/Family Stated Goal: eat/drink Consulted and agree with results and recommendations: Patient Pain: Pain Assessment Pain Assessment: Faces Faces Pain Scale:  6 Pain Location: pt reports everywhere Pain Descriptors / Indicators: Discomfort; Grimacing; Guarding; Moaning Pain Intervention(s): Limited activity within patient's tolerance; Monitored during session; Repositioned End of Session: Start Time:SLP Start Time (ACUTE ONLY): 1230 Stop Time: SLP Stop Time (ACUTE ONLY): 1300 Time Calculation:SLP Time Calculation (min) (ACUTE ONLY): 30 min Charges: SLP Evaluations $ SLP Speech Visit: 1 Visit SLP Evaluations $BSS Swallow: 1 Procedure $MBS Swallow: 1 Procedure SLP visit diagnosis: SLP Visit Diagnosis: Dysphagia, unspecified (R13.10) Past Medical History: Past Medical History: Diagnosis Date  At low risk for fall   Cancer (HCC)   prostrate ca, radiation treatments  Chest pain   COPD (chronic obstructive pulmonary disease) (HCC)   Dementia (HCC)   Diabetes mellitus without complication (HCC)   Dizziness   History of tobacco abuse   Hyperlipidemia   Hypertension   Stroke [facility])   Subconjunctival hemorrhage of left eye 07/18/2019  The condition of the involved eye is that of a subconjunctival hemorrhage.  These are often spontaneous but are also often at or near the site of low location of an injection should to be placed into the eye.  In the absence of direct blunt or severe trauma these will typically resolve on their own spontaneously and have no  impact on the vision.    These are very similar to having a bruise in your Past Surgical History: Past Surgical History: Procedure Laterality Date  CHOLECYSTECTOMY N/A 01/30/2014  Procedure: LAPAROSCOPIC CHOLECYSTECTOMY;  Surgeon: [name], MD;  Location: [facility] OR;  Service: General;  Laterality: N/A; Cassidi F Sockwell 11/25/2023, 3:00 PM     LOS: 9 days   [place], NP [facility] Pager: [phone]  11/27/2023, 9:12 AM

## 2023-11-27 NOTE — Discharge Summary (Signed)
 Physician Discharge Summary  Peter Mann FMW:[id number] DOB: Sep 20, 1940 DOA: 11/17/2023  PCP: [name], MD  Admit date: 11/17/2023 Discharge date: 11/27/2023  Admitted From: (Home) Disposition:  (SNF)  Recommendations for Outpatient Follow-up:  Please obtain BMP/CBC in one week Patient will need outpatient follow-up with urology regarding cystoscopy With known chronic dysphagia, continue with dysphagia to thin liquid diet, always feed with aspiration precaution, sitting up Continue with TLSO brace upon standing up and activity       [facility] [address] second floor [place], [place]  [address] [phone]   Diet recommendation: With known chronic dysphagia, continue with dysphagia to thin liquid diet, always feed with aspiration precaution, sitting up  Brief/Interim Summary:   83 y.o. male with PMH significant for DM2, HTN, HLD, smoking, stroke, dementia, CKD, COPD, recurrent falls, prostate cancer s/p radiation. 9/9, patient was brought to the ED after he was found on the ground.\   Per report, family last saw him 4 days ago and was not able to hear back from him after that.  They sent out a wellness check and EMS found the patient on the floor confused and brought to the ED. in the ER he was diagnosed with dehydration, rhabdomyolysis, AKI and admitted.     AKI on CKD 2.  Baseline creatinine around 1.3, acute metabolic acidosis, fall related rhabdomyolysis. He had multiple falls in the past several months according to the daughter, MRI brain nonacute, MRI C-spine nonacute.  Patient with IV fluids, currently euvolemic, renal function back to baseline    History of C3-C4 spinal stenosis.   - With PT, OT and outpatient follow-up with neurology and neurosurgery .  Acute on chronic mid-low back pain.  - Multiple falls over the last several months at home, CT T and L-spine noted with nonspecific fractures, previous MD discussed with  neurosurgeon Dr. Reyes Budge in detail on 11/19/2023, no urgent surgery needed, TLSO brace, PT OT and pain control.  Encouraged patient to think about SNF due to ongoing multiple falls.  Outpatient follow-up with Dr. Budge postdischarge, no MRI needed.   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 - Foley catheter inserted, he had clear urine for 2 days, so Foley discontinued 9/18, no evidence of retention or hematuria at this point . - Patient will need cystoscopy as an outpatient   Acute metabolic encephalopathy with underlying dementia.  MRI brain nonacute, minimize narcotics.  - Mentation much improved   Chronic dysphagia.   -Seen  by SLP,.  MBS study done, concern for aspiration and dysphagia related to structural issue related to cervical spine disease .  Is a chronic issue, he remains at risk for aspiration.  So aspiration precaution to be taken whenever he is fed, continue dysphagia 2, thin liquid, feed only while sitting up, with aspiration precaution.     Hypertension.  Placed on combination of Norvasc , beta-blocker and Imdur .   Anemia of chronic disease, chronic vitamin B12 deficiency.  Folic acid  deficiency - placed on oral iron and B12 supplementation, as well on folic acid  supplements.   Dehydration with hypernatremia.   - Resolved with IV fluid   Conjunctivitis - Finish another 3 days of ofloxacin    History of stroke.  Continue on aspirin  but will cut down from 324 to 81 mg on 11/24/2023 due to hematuria, continue statin for secondary prevention.  DM type II.  He is on multiple oral agents, but it was held due to his AKI, he was kept on Actos on a scheduled hospital stay where it was well-controlled, so we will continue only with sliding scale on discharge .   Pressure ulcers:  POA Continue with wound care Wound 11/18/23 1748 Pressure Injury Foot Left;Medial (Active)     Wound 11/18/23 1756 Pressure Injury Back Right;Upper (Active)     Wound 11/18/23 1759 Pressure Injury Hip Right (Active)     Wound 11/19/23 1300 Pressure Injury Coccyx Stage 2 -  Partial thickness loss of dermis presenting as a shallow open injury with a red, pink wound bed without slough. (Active)      Discharge Diagnoses:  Principal Problem:   ARF (acute renal failure) (HCC) Active Problems:   Essential hypertension   Hyperlipidemia   CVA (cerebral vascular accident) (HCC)   Diabetes mellitus type 2, noninsulin dependent (HCC)   History of stroke   Anemia   Rhabdomyolysis   Elevated LFTs   Acute renal failure (ARF) (HCC)    Discharge Instructions  Discharge Instructions     Diet - low sodium heart healthy   Complete by: As directed    Discharge instructions   Complete by: As directed    Follow with Primary MD [name], MD /SNF physician  Get CBC, CMP,  checked  by Primary MD next visit.    Activity: As tolerated with Full fall precautions use walker/cane & assistance as needed   Disposition SNF   Diet: Dysphagia 2 with thin liquid, fall aspiration precaution   On your next visit with your primary care physician please Get Medicines reviewed and adjusted.   Please request your Prim.MD to go over all Hospital Tests and Procedure/Radiological results at the follow up, please get all Hospital records sent to your Prim MD by signing hospital release before you go home.   If you experience worsening of your admission symptoms, develop shortness of breath, life threatening emergency, suicidal or homicidal thoughts you must seek medical attention immediately by calling 911 or calling your MD immediately  if symptoms less severe.  You Must read complete instructions/literature along with all the possible adverse reactions/side effects for all the Medicines you take  and that have been prescribed to you. Take any new Medicines after you have completely understood and accpet all the possible adverse reactions/side effects.   Do not drive, operating heavy machinery, perform activities at heights, swimming or participation in water  activities or provide baby sitting services if your were admitted for syncope or siezures until you have seen by Primary MD or a Neurologist and advised to do so again.  Do not drive when taking Pain medications.    Do not take more than prescribed Pain, Sleep and Anxiety Medications  Special Instructions: If you have smoked or chewed Tobacco  in the last 2 yrs please stop smoking, stop any regular Alcohol   and or any Recreational drug use.  Wear Seat belts while driving.   Please note  You were cared for by a hospitalist during your hospital stay. If you have any questions about your discharge medications or the care you received while you were in the hospital after you are discharged, you can call the unit and asked to speak with the hospitalist on call if the hospitalist that took care of you is not available. Once you are discharged, your primary care physician will handle any further medical issues. Please note that NO  REFILLS for any discharge medications will be authorized once you are discharged, as it is imperative that you return to your primary care physician (or establish a relationship with a primary care physician if you do not have one) for your aftercare needs so that they can reassess your need for medications and monitor your lab values.   Discharge wound care:   Complete by: As directed    Wound care  Every 12 hours    Comments: Coccyx Wound cleanse wound with normal saline, pat dry and apply 2 layers of Xeroform yellow guaze over wound, cover with 4x4 Mepilex foam dressing, change out foam dressing every 3 days. BID   Wound care  Until discontinued      Comments: Right Lateral Leg Wound cleanse with normal  saline, pat dry, apply 4x4 Mepilex foam dressing, assess every shift, change foam dressing every 3 days. Right Back wound cleanse with normal saline, pat dry with gauze, and apply 4 x 4 Mepilex foam dressing, assess every shift, change every 3 days. Date all dressings.    Wound care  Every shift      Comments: Right Hip cleanse with normal saline, pat dry and apply Medihoney gel daily, cover with 4 x 4 Mepilex foam dressing. Left great toe; open area. Cleanse with Sage wipes daily, apply socks at all times, off-load heels onto pillows or use protective boots.   Increase activity slowly   Complete by: As directed       Allergies as of 11/27/2023   No Known Allergies      Medication List     STOP taking these medications    acetaminophen  500 MG tablet Commonly known as: TYLENOL    aspirin  325 MG tablet Replaced by: aspirin  EC 81 MG tablet   Jardiance 25 MG Tabs tablet Generic drug: empagliflozin   meclizine  25 MG tablet Commonly known as: ANTIVERT    metFORMIN  500 MG tablet Commonly known as: GLUCOPHAGE    metoprolol  tartrate 25 MG tablet Commonly known as: LOPRESSOR    saxagliptin HCl 5 MG Tabs tablet Commonly known as: ONGLYZA       TAKE these medications    Alphagan  P 0.1 % Soln Generic drug: brimonidine  Place 1 drop into both eyes 2 (two) times daily.   amLODipine  10 MG tablet Commonly known as: NORVASC  Take 10 mg by mouth daily.   aspirin  EC 81 MG tablet Take 1 tablet (81 mg total) by mouth daily. Swallow whole. Start taking on: November 28, 2023 Replaces: aspirin  325 MG tablet   carvedilol  6.25 MG tablet Commonly known as: COREG  Take 1 tablet (6.25 mg total) by mouth 2 (two) times daily with a meal.   Cequa  0.09 % Soln Generic drug: cycloSPORINE  (PF) Place 1 drop into both eyes 2 (two) times daily.   cyanocobalamin 1000 MCG tablet Commonly known as: VITAMIN B12 Take 1 tablet (1,000 mcg total) by mouth daily.   dexlansoprazole 60 MG  capsule Commonly known as: DEXILANT Take 60 mg by mouth daily.   diclofenac sodium 1 % Gel Commonly known as: VOLTAREN Apply 2 g topically as needed (pain).   docusate sodium  100 MG capsule Commonly known as: COLACE Take 2 capsules (200 mg total) by mouth 2 (two) times daily.   ferrous sulfate  325 (65 FE) MG tablet Take 1 tablet (325 mg total) by mouth 2 (two) times daily with a meal.   folic acid  1 MG tablet Commonly known as: FOLVITE  Take 1 tablet (1 mg total) by mouth daily. Start  taking on: November 28, 2023   hydrALAZINE  50 MG tablet Commonly known as: APRESOLINE  Take 1 tablet (50 mg total) by mouth every 8 (eight) hours.   insulin  aspart 100 UNIT/ML injection Commonly known as: novoLOG  Inject 0-9 Units into the skin 3 (three) times daily with meals. insulin  aspart (novoLOG ) injection 0-9 Units  0-9 Units, Subcutaneous, 3 times daily with meals, First dose on Wed 11/18/23 at 0800 Correction coverage: Sensitive (thin, NPO, renal) CBG < 70: Implement Hypoglycemia Standing Orders and refer to Hypoglycemia Standing Orders sidebar report CBG 70 - 120: 0 units CBG 121 - 150: 1 unit CBG 151 - 200: 2 units CBG 201 - 250: 3 units CBG 251 - 300: 5 units CBG 301 - 350: 7 units CBG 351 - 400: 9 units CBG > 400: call MD   isosorbide  mononitrate 30 MG 24 hr tablet Commonly known as: IMDUR  Take 1 tablet (30 mg total) by mouth daily.   Linzess  290 MCG Caps capsule Generic drug: linaclotide  Take 290 mcg by mouth daily.   lubiprostone 24 MCG capsule Commonly known as: AMITIZA Take 24 mcg by mouth 2 (two) times daily with a meal.   ofloxacin  0.3 % ophthalmic solution Commonly known as: OCUFLOX  Place 1 drop into both eyes every 2 (two) hours for 3 days.   polyethylene glycol 17 g packet Commonly known as: MiraLax  Take 17 g by mouth daily as needed for moderate constipation or mild constipation (if no bowel movement in 2 days).   rosuvastatin  20 MG tablet Commonly known as:  CRESTOR  Take 20 mg by mouth daily.   senna-docusate 8.6-50 MG tablet Commonly known as: Senokot-S Take 1 tablet by mouth at bedtime.   tamsulosin  0.4 MG Caps capsule Commonly known as: FLOMAX  Take 0.4 mg by mouth daily.               Discharge Care Instructions  (From admission, onward)           Start     Ordered   11/27/23 0000  Discharge wound care:       Comments: Wound care  Every 12 hours    Comments: Coccyx Wound cleanse wound with normal saline, pat dry and apply 2 layers of Xeroform yellow guaze over wound, cover with 4x4 Mepilex foam dressing, change out foam dressing every 3 days. BID   Wound care  Until discontinued      Comments: Right Lateral Leg Wound cleanse with normal saline, pat dry, apply 4x4 Mepilex foam dressing, assess every shift, change foam dressing every 3 days. Right Back wound cleanse with normal saline, pat dry with gauze, and apply 4 x 4 Mepilex foam dressing, assess every shift, change every 3 days. Date all dressings.    Wound care  Every shift      Comments: Right Hip cleanse with normal saline, pat dry and apply Medihoney gel daily, cover with 4 x 4 Mepilex foam dressing. Left great toe; open area. Cleanse with Sage wipes daily, apply socks at all times, off-load heels onto pillows or use protective boots.   11/27/23 1119            Contact information for after-discharge care     Destination     [place] [facility] [place], [place] .   Service: Skilled Nursing Contact information: [address] [place]  [phone]                    No Known Allergies  Consultations:  Urology   Procedures/Studies: DG Swallowing Func-Speech Pathology Result Date: 11/25/2023 Table formatting from the original result was not included. Modified Barium Swallow Study Patient Details Name: Peter Mann MRN: [id number] Date of Birth: Dec 29, 1940 Today's Date: 11/25/2023 HPI/PMH: HPI: The pt is an 83 yo male  presenting 9/9 after being found on ground by family. Admitted for management of rhabdomyolysis and possible UTI. PMH includes: HTN, HLD, CVA, DM II, tobacco use, BPH, and dementia. BSE 9/9 impulsive, no s/s aspiration. F/u 9/12 no s/s aspiration, rec Dys 3/thin and signed off. Coughing with thin during PT session 9/15 and swallow re-eval ordered. 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 Bulky anterior  osteophytes. Multilevel degenerative disc space narrowing. Fusion of  the facets at multiple levels with facet degenerative change. Clinical Impression: Patient presents with elements of pharyngeal dysphagia exacerbated by structural components. Patients oral phase is characterized by oral residuals, requiring occasional additional swallows to clear oral cavity. Majority of deficits are noted pharyngeally where patient achieves minimal epiglottic inversion and pharyngeal stasis due to chronic structural abnormalities. These deficits resulted in severe vallecular and pyriform sinus residuals as well as gross audible aspiration of large volumes of thin liquids. Patient with eventual silent aspiration of nectar and honey thick liquids residuals, though unable to differenciate consistency due to amount of pharyngeal residuals. SLP attempted use of chin tuck and effortful swallow, though these compensatory strategies were minimally effective and patient cognitively unable to utilize independently. Pharyngeal residuals were reduced after multiple dry swallows, however not eliminated. Per MD, no recent ethiologies that would result in overall change in swallow function and therefore patients dysphagia is likely due to chronic structural issues. Patient with [facility] WBC, vital signs and CXR. SLP to speak with family regarding safest swallow recommendations vs continuation of current diet. Factors that  may increase risk of adverse event in presence of aspiration [name] 2021): Factors that may increase risk of adverse event in presence of aspiration [name] 2021): Reduced cognitive function; Aspiration of thick, dense, and/or acidic materials; Frequent aspiration of large volumes; Frail or deconditioned Recommendations/Plan: Swallowing Evaluation Recommendations Swallowing Evaluation Recommendations Supervision: Full supervision/cueing for swallowing strategies Swallowing strategies  : Minimize environmental distractions; Slow rate; Small bites/sips; effortful swallow; Multiple dry swallows after each bite/sip Postural changes: Position pt fully upright for meals; Stay upright 30-60 min after meals Oral care recommendations: Oral care QID (4x/day) Treatment Plan Treatment Plan Treatment recommendations: Therapy as outlined in treatment plan below Follow-up recommendations: Skilled nursing-short term rehab (<3 hours/day) Functional status assessment: Patient has had a recent decline in their functional status and/or demonstrates limited ability to make significant improvements in function in a reasonable and predictable amount of time. Treatment frequency: Min 2x/week Treatment duration: 2 weeks Interventions: Aspiration precaution training; Oropharyngeal exercises; Compensatory techniques; Patient/family education; Diet toleration management by SLP Recommendations Recommendations for follow up therapy are one component of a multi-disciplinary discharge planning process, led by the attending physician.  Recommendations may be updated based on patient status, additional functional criteria and insurance authorization. Assessment: Orofacial Exam: Orofacial Exam Oral Cavity - Dentition: Adequate natural dentition Oral Motor/Sensory Function: WFL Anatomy: Anatomy: Suspected cervical osteophytes; Other (Comment) (Bulky anterior osteophytes. Multilevel degenerative disc space narrowing. Severe  bilateral foraminal narrowing C3-C4 C6-C7C7-T1. Large posterior disc osteophyte at C3-C4 results in severe canal stenosis.) Boluses Administered: Boluses Administered Boluses Administered: Thin liquids (Level 0); Mildly thick liquids (  Level 2, nectar thick); Moderately thick liquids (Level 3, honey thick); Puree  Oral Impairment Domain: Oral Impairment Domain Lip Closure: No labial escape Tongue control during bolus hold: Cohesive bolus between tongue to palatal seal Bolus transport/lingual motion: Repetitive/disorganized tongue motion Oral residue: Residue collection on oral structures Initiation of pharyngeal swallow : Pyriform sinuses  Pharyngeal Impairment Domain: Pharyngeal Impairment Domain Soft palate elevation: Trace column of contrast or air between SP and PW Laryngeal elevation: Partial superior movement of thyroid  cartilage/partial approximation of arytenoids to epiglottic petiole Anterior hyoid excursion: Partial anterior movement Epiglottic movement: -- (limited inversion due to structure) Laryngeal vestibule closure: Incomplete, narrow column air/contrast in laryngeal vestibule Pharyngeal stripping wave : Present - diminished Pharyngeal contraction (A/P view only): N/A Pharyngoesophageal segment opening: Partial distention/partial duration, partial obstruction of flow Tongue base retraction: Narrow column of contrast or air between tongue base and PPW Pharyngeal residue: Majority of contrast within or on pharyngeal structures Location of pharyngeal residue: Diffuse (>3 areas); Pyriform sinuses; Valleculae; Tongue base  Esophageal Impairment Domain: No data recorded Pill: No data recorded Penetration/Aspiration Scale Score: Penetration/Aspiration Scale Score 1.  Material does not enter airway: Puree 8.  Material enters airway, passes BELOW cords without attempt by patient to eject out (silent aspiration) : Thin liquids (Level 0); Mildly thick liquids (Level 2, nectar thick); Moderately thick liquids  (Level 3, honey thick) Compensatory Strategies: Compensatory Strategies Compensatory strategies: Yes Effortful swallow: Ineffective Multiple swallows: -- (minimally effective) Chin tuck: Ineffective Liquid wash: Ineffective   General Information: Caregiver present: No  Diet Prior to this Study: Dysphagia 3 (mechanical soft); Thin liquids (Level 0)   Temperature : Normal   Respiratory Status: WFL   Supplemental O2: None (Room air)   No data recorded Behavior/Cognition: Alert; Cooperative; Pleasant mood No data recorded No data recorded No data recorded Volitional Swallow: Able to elicit Exam Limitations: No limitations Goal Planning: Prognosis for improved oropharyngeal function: Guarded No data recorded Barriers/Prognosis Comment: structural abnormalities Patient/Family Stated Goal: eat/drink Consulted and agree with results and recommendations: Patient Pain: Pain Assessment Pain Assessment: Faces Faces Pain Scale: 6 Pain Location: pt reports everywhere Pain Descriptors / Indicators: Discomfort; Grimacing; Guarding; Moaning Pain Intervention(s): Limited activity within patient's tolerance; Monitored during session; Repositioned End of Session: Start Time:SLP Start Time (ACUTE ONLY): 1230 Stop Time: SLP Stop Time (ACUTE ONLY): 1300 Time Calculation:SLP Time Calculation (min) (ACUTE ONLY): 30 min Charges: SLP Evaluations $ SLP Speech Visit: 1 Visit SLP Evaluations $BSS Swallow: 1 Procedure $MBS Swallow: 1 Procedure SLP visit diagnosis: SLP Visit Diagnosis: Dysphagia, unspecified (R13.10) Past Medical History: Past Medical History: Diagnosis Date  At low risk for fall   Cancer (HCC)   prostrate ca, radiation treatments  Chest pain   COPD (chronic obstructive pulmonary disease) (HCC)   Dementia (HCC)   Diabetes mellitus without complication (HCC)   Dizziness   History of tobacco abuse   Hyperlipidemia   Hypertension   Stroke [facility])   Subconjunctival hemorrhage of left eye 07/18/2019  The condition of the involved eye  is that of a subconjunctival hemorrhage.  These are often spontaneous but are also often at or near the site of low location of an injection should to be placed into the eye.  In the absence of direct blunt or severe trauma these will typically resolve on their own spontaneously and have no impact on the vision.    These are very similar to having a bruise in your Past Surgical History: Past Surgical History: Procedure Laterality Date  CHOLECYSTECTOMY N/A 01/30/2014  Procedure: LAPAROSCOPIC CHOLECYSTECTOMY;  Surgeon: [name], MD;  Location: [facility] OR;  Service: General;  Laterality: N/A; Cassidi F Sockwell 11/25/2023, 3:00 PM  US  RENAL Result Date: 11/24/2023 CLINICAL DATA:  Hematuria. EXAM: RENAL / URINARY TRACT ULTRASOUND COMPLETE COMPARISON:  August 27, 2015. FINDINGS: Right Kidney: Renal measurements: 11.6 x 5.0 x 4.8 cm = volume: 146 mL. Echogenicity within normal limits. No mass or hydronephrosis visualized. Left Kidney: Renal measurements: 11.3 x 4.8 x 5.9 cm = volume: 167 mL. Echogenicity within normal limits. No mass or hydronephrosis visualized. Bladder: Large amount of heterogeneous material is noted in the dependent portion of urinary bladder most consistent with debris or possibly hemorrhage. Cystoscopy is recommended for further evaluation. Other: None. IMPRESSION: 1. Large amount of heterogeneous material is noted in the dependent portion of urinary bladder most consistent with debris or possibly hemorrhage. Cystoscopy is recommended to evaluate for possible mass. 2. No hydronephrosis or renal obstruction is noted. Electronically Signed   By: Lynwood Landy Raddle M.D.   On: 11/24/2023 12:20   CT LUMBAR SPINE WO CONTRAST Result Date: 11/19/2023 EXAM: CT OF THE LUMBAR SPINE WITHOUT CONTRAST 11/19/2023 12:49:09 PM TECHNIQUE: CT of the lumbar spine was performed without the administration of intravenous contrast. Multiplanar reformatted images are provided for review. Automated exposure control,  iterative reconstruction, and/or weight based adjustment of the mA/kV was utilized to reduce the radiation dose to as low as reasonably achievable. COMPARISON: CT lumbar spine 05/27/2018 and same day CT thoracic spine. CLINICAL HISTORY: Low back pain, increased fracture risk. Chief complaints; Fall; CT THORACIC SPINE WO CONTRAST; Compression fracture, thoracic; CT LUMBAR SPINE WO CONTRAST; Low back pain, increased fracture risk. FINDINGS: BONES AND ALIGNMENT: Similar grade 1 anterolisthesis of L4 on L5, alignment otherwise maintained. Similar appearance of bilateral pars defects at L3. Vertebral body heights are maintained. No evidence of acute compression fracture or displaced fracture in the lumbar spine. No suspicious lytic osseous lesion. DEGENERATIVE CHANGES: Degenerative endplate osteophytes, multiple levels endplate sclerosis most pronounced at L5-S1. Mild-to-moderate disc space narrowing at L5-S1 is similar to prior with associated vacuum disc. At T12-L1 there is no significant spinal canal or foraminal stenosis. At L1-L2 there are small posterior osteophytes and mild facet arthrosis. No significant spinal canal or foraminal stenosis. At L2-3 there is mild mineralization of the disc with small posterior osteophytes, additional prominent lateral osteophytes, particularly on the left, and mild facet arthrosis. There is no significant spinal canal or foraminal stenosis. Prominent lateral osteophytes with potential impingement upon the extraforaminal left L2 nerve root. At L3-4 there is a small disc bulge and posterior osteophytes, mild facet arthrosis, and mild spinal canal stenosis. Moderate bilateral foraminal stenosis, similar to prior. At L4-5, there is anterolisthesis, partial uncovering of the disc, diffuse disc bulge, moderate facet arthrosis, and thickening of the ligaments and flavum. There is moderate spinal canal stenosis, similar to prior. There is moderate-to-severe bilateral foraminal stenosis  which appears slightly increased. At L5-S1 there is a diffuse disc bulge and posterior osteophytes, moderate-to-severe bilateral facet arthrosis. There is mild spinal canal stenosis, moderate-to-severe bilateral foraminal stenosis, slightly increased from prior. SOFT TISSUES: The paraspinal soft tissues are unremarkable. 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 IMPRESSION: 1. No evidence of acute compression fracture or displaced fracture in the lumbar spine. 2. 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 3. Moderate-to-severe bilateral foraminal stenosis at L4-5 and L5-S1, increased from prior. Electronically  signed by: Donnice Mania MD 11/19/2023 01:37 PM EDT RP Workstation: [id number]   CT THORACIC SPINE WO CONTRAST Result Date: 11/19/2023 EXAM: CT THORACIC SPINE WITHOUT CONTRAST 11/19/2023 12:49:09 PM TECHNIQUE: CT of the thoracic spine was performed without the administration of intravenous contrast. Multiplanar reformatted images are provided for review. Automated exposure control, iterative reconstruction, and/or weight based adjustment of the mA/kV was utilized to reduce the radiation dose to as low as reasonably achievable. COMPARISON: CT cervical spine 12/03/2018 and CT lumbar spine 05/27/2018. Thoracic spine radiographs 05/27/2018. CLINICAL HISTORY: Compression fracture, thoracic. Chief complaints; Fall; CT THORACIC SPINE WO CONTRAST; Compression fracture, thoracic; CT LUMBAR SPINE WO CONTRAST; Low back pain, increased fracture risk. 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 There is focal disruption of the osteophytes anteriorly at the T9-10 level with lucency and gas extending through the defect into the anterior aspect of the T9-10 disc. Findings are concerning for  fractured osteophytes. The vertebral body heights are maintained without evidence of compression fracture. Mild dextrocurvature of the thoracic spine centered at T8-9. Thoracic kyphosis is maintained. DEGENERATIVE CHANGES: Degenerative endplate osteophytes and degenerative endplate changes at multiple levels. There is no suspicious lytic osseous lesion. There is partial osseous fusion across the disc spaces at T6-7 through T8-9. SOFT TISSUES: The paraspinal soft tissues are unremarkable. Small left pleural effusion. Trace pleural effusion on the right. VASCULATURE: Atherosclerosis of the coronary arteries. SPINAL CANAL AND FORAMINA: There is no high-grade osseous spinal canal stenosis. No CT evidence of large disc herniation. No high-grade osseous foraminal stenosis. IMPRESSION: 1. No acute compression fracture. 2. Findings concerning for fractured osteophytes at T9-10 with lucency and gas extending into the anterior aspect of the T9-10 disc. Consider MRI for further evaluation. Electronically signed by: Donnice Mania MD 11/19/2023 01:28 PM EDT RP Workstation: [id number]   MR [name] CONTRAST Result Date: 11/18/2023 CLINICAL DATA:  83 year old male with altered mental status. Found down by family. Abnormal labs concerning for rhabdomyolysis and dehydration. EXAM: MRI HEAD WITHOUT CONTRAST TECHNIQUE: Multiplanar, multiecho pulse sequences of the brain and surrounding structures were obtained without intravenous contrast. COMPARISON:  CT head and cervical spine yesterday. Brain MRI 08/23/2015. FINDINGS: Brain: Cerebral volume not significantly changed from 2017 MRI. No restricted diffusion to suggest acute infarction. No midline shift, mass effect, evidence of mass lesion, ventriculomegaly, extra-axial collection or acute intracranial hemorrhage. Cervicomedullary junction and pituitary are within normal limits. Chronic lacunar infarcts right thalamus, bilateral cerebellum appears stable since 2017. Patchy and  scattered additional bilateral cerebral white matter T2 and FLAIR hyperintensity has progressed since that time, moderate for age. No convincing chronic cerebral blood products on SWI. No convincing cortical encephalomalacia. Vascular: Major intracranial vascular flow voids are stable since 2017. 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 Normal background bone marrow signal. Sinuses/Orbits: Postoperative changes to the left globe since 2017. Scattered mild to moderate paranasal sinus mucosal thickening. Other: Mastoids remain well aerated. Trace retained secretions in the nasopharynx. Visible scalp and face appear negative. IMPRESSION: 1. No acute intracranial abnormality. 2. 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 Other moderate for age cerebral white matter disease has progressed since that  time. 3. 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 Spinal stenosis at C3-C4 appears chronic but may have increased from prior 2015, 2017 MRIs. Electronically Signed   By: VEAR Hurst M.D.   On: 11/18/2023 05:22   CT Cervical Spine Wo Contrast Result Date: 11/17/2023 CLINICAL DATA:  Head trauma EXAM: CT HEAD WITHOUT CONTRAST CT CERVICAL SPINE WITHOUT CONTRAST TECHNIQUE: Multidetector CT imaging of the head and cervical spine was performed following the standard protocol without intravenous contrast. Multiplanar CT image reconstructions of the cervical spine were also generated. RADIATION DOSE REDUCTION: This exam was performed according to the departmental dose-optimization program which includes automated exposure control, adjustment of the mA and/or kV according to  patient size and/or use of iterative reconstruction technique. COMPARISON:  CT brain 05/08/2023, MRI 08/23/2015, MRI cervical spine 12/01/2013 FINDINGS: CT HEAD FINDINGS Brain: No acute territorial infarction, hemorrhage or intracranial mass. Moderate advanced atrophy. Moderate chronic small vessel ischemic changes of the white matter. Prominent ventricles felt secondary to atrophy. Vascular: No hyperdense vessels.  Carotid vascular calcification Skull: Normal. Negative for fracture or focal lesion. Sinuses/Orbits: Patchy mucosal thickening Other: None CT CERVICAL SPINE FINDINGS Alignment: Facet alignment is maintained. Straightening of the cervical spine. No subluxation. Skull base and vertebrae: No definitive fracture. Soft tissues and spinal canal: No prevertebral fluid or swelling. No visible canal hematoma. Disc levels: Partial ankylosis C4 through C7. Bulky anterior osteophytes. Multilevel degenerative disc space narrowing. Fusion of the facets at multiple levels with facet degenerative change. Severe bilateral foraminal narrowing C3-C4 and C6-C7 and C7-T1. Large posterior disc osteophyte at C3-C4 results in severe canal stenosis. Relative widening of anterior disc space/osteophytes at C3 C4. Upper chest: Negative. Other: None IMPRESSION: 1. No CT evidence for acute intracranial abnormality. Atrophy and chronic small vessel ischemic changes of the white matter. 2. Straightening of the cervical spine with multilevel degenerative changes. No definitive acute fracture is seen. 3. Relative widening of anterior disc space/osteophytes at C3-C4, of uncertain significance. If there is high clinical suspicion for ligamentous injury, correlation with MRI could be obtained. Electronically Signed   By: Luke Bun M.D.   On: 11/17/2023 22:30   CT Head Wo Contrast Result Date: 11/17/2023 CLINICAL DATA:  Head trauma EXAM: CT HEAD WITHOUT CONTRAST CT CERVICAL SPINE WITHOUT CONTRAST TECHNIQUE: Multidetector CT imaging of  the head and cervical spine was performed following the standard protocol without intravenous contrast. Multiplanar CT image reconstructions of the cervical spine were also generated. RADIATION DOSE REDUCTION: This exam was performed according to the departmental dose-optimization program which includes automated exposure control, adjustment of the mA and/or kV according to patient size and/or use of iterative reconstruction technique. COMPARISON:  CT brain 05/08/2023, MRI 08/23/2015, MRI cervical spine 12/01/2013 FINDINGS: CT HEAD FINDINGS Brain: No acute territorial infarction, hemorrhage or intracranial mass. Moderate advanced atrophy. Moderate chronic small vessel ischemic changes of the white matter. Prominent ventricles felt secondary to atrophy. Vascular: No hyperdense vessels.  Carotid vascular calcification Skull: Normal. Negative for fracture or focal lesion. Sinuses/Orbits: Patchy mucosal thickening Other: None CT CERVICAL SPINE FINDINGS Alignment: Facet alignment is maintained. Straightening of the cervical spine. No subluxation. Skull base and vertebrae: No definitive fracture. Soft tissues and spinal canal: No prevertebral fluid or swelling. No visible canal hematoma. Disc levels: Partial ankylosis C4 through C7. Bulky anterior osteophytes. Multilevel degenerative disc space narrowing. Fusion of the facets at multiple levels with facet degenerative change. Severe bilateral foraminal narrowing C3-C4 and C6-C7 and C7-T1. Large posterior disc osteophyte  at C3-C4 results in severe canal stenosis. Relative widening of anterior disc space/osteophytes at C3 C4. Upper chest: Negative. Other: None IMPRESSION: 1. No CT evidence for acute intracranial abnormality. Atrophy and chronic small vessel ischemic changes of the white matter. 2. Straightening of the cervical spine with multilevel degenerative changes. No definitive acute fracture is seen. 3. Relative widening of anterior disc space/osteophytes at C3-C4,  of uncertain significance. If there is high clinical suspicion for ligamentous injury, correlation with MRI could be obtained. Electronically Signed   By: Luke Bun M.D.   On: 11/17/2023 22:30   DG Chest Portable 1 View Result Date: 11/17/2023 CLINICAL DATA:  [place] EXAM: PORTABLE CHEST 1 VIEW COMPARISON:  05/08/2023 FINDINGS: Single frontal view of the chest demonstrates an unremarkable cardiac silhouette. No airspace disease, effusion, or pneumothorax. No acute bony abnormalities. IMPRESSION: 1. No acute intrathoracic process. Electronically Signed   By: Ozell Daring M.D.   On: 11/17/2023 21:12   DG Pelvis Portable Result Date: 11/17/2023 CLINICAL DATA:  Fall EXAM: PORTABLE PELVIS 1-2 VIEWS COMPARISON:  05/08/2023 FINDINGS: There is no evidence of pelvic fracture or diastasis. No pelvic bone lesions are seen. IMPRESSION: No acute bony abnormality. Electronically Signed   By: Franky Crease M.D.   On: 11/17/2023 21:11      Subjective:  No significant events overnight, he denies any complaints, Foley catheter was discontinued yesterday, no evidence of retention or hematuria Discharge Exam: Vitals:   11/27/23 0800 11/27/23 1102  BP: (!) 146/73 (!) 170/68  Pulse: 77 77  Resp:    Temp: (!) 97.3 F (36.3 C) 98.7 F (37.1 C)  SpO2: 97% 97%   Vitals:   11/27/23 0415 11/27/23 0800 11/27/23 0802 11/27/23 1102  BP: (!) 169/74 (!) 146/73  (!) 170/68  Pulse: 77 77  77  Resp: 16     Temp: 97.9 F (36.6 C) (!) 97.3 F (36.3 C)  98.7 F (37.1 C)  TempSrc: Oral  Oral Oral  SpO2: 100% 97%  97%  Weight:      Height:        General: Pt is alert, awake, not in acute distress, frail, deconditioned Cardiovascular: RRR, S1/S2 +, no rubs, no gallops Respiratory: CTA bilaterally, no wheezing, no rhonchi Abdominal: Soft, NT, ND, bowel sounds + Extremities: no edema, no cyanosis    The results of significant diagnostics from this hospitalization (including imaging, microbiology, ancillary and  laboratory) are listed below for reference.     Microbiology: No results found for this or any previous visit (from the past 240 hours).   Labs: BNP (last 3 results) Recent Labs    05/09/23 0550  BNP 80.0   Basic Metabolic Panel: Recent Labs  Lab 11/21/23 0415 11/22/23 0357 11/23/23 0433 11/24/23 0422 11/25/23 0412 11/26/23 0335 11/26/23 0656 11/27/23 0303  NA 140 144 146* 140 137 138  --  140  K 4.0 4.0 4.3 3.9 4.0 3.5  --  4.0  CL 111 110 110 107 106 102  --  104  CO2 18* 24 23 22 23 23   --  24  GLUCOSE 124* 125* 108* 101* 104* 103*  --  123*  BUN 73* 56* 48* 46* 42* 32*  --  35*  CREATININE 2.38* 2.11* 2.15* 2.05* 2.00* 1.81*  --  1.80*  CALCIUM  8.5* 8.9 9.1 8.8* 8.7* 8.8*  --  8.8*  MG 1.8 1.7 1.6* 2.3 2.0 1.8  --  1.9  PHOS 2.9 3.4 3.6  --   --   --  4.6 4.3   Liver Function Tests: No results for input(s): AST, ALT, ALKPHOS, BILITOT, PROT, ALBUMIN in the last 168 hours. No results for input(s): LIPASE, AMYLASE in the last 168 hours. No results for input(s): AMMONIA in the last 168 hours. CBC: Recent Labs  Lab 11/22/23 0357 11/23/23 0433 11/24/23 0445 11/25/23 0412 11/26/23 0335 11/27/23 0303  WBC 5.0 7.8 5.3 6.6 9.9 6.5  NEUTROABS 3.7 6.2  --  5.1 8.3* 5.1  HGB 9.1* 9.3* 8.8* 8.5* 8.1* 8.2*  HCT 28.2* 29.3* 28.2* 26.6* 25.5* 26.2*  MCV 83.2 84.4 84.9 83.9 84.2 84.8  PLT 219 256 253 248 242 284   Cardiac Enzymes: Recent Labs  Lab 11/21/23 0415 11/22/23 0357 11/23/23 0433  CKTOTAL 118 90 90   BNP: Invalid input(s): POCBNP CBG: Recent Labs  Lab 11/26/23 0808 11/26/23 1226 11/26/23 1535 11/27/23 0806 11/27/23 1125  GLUCAP 103* 138* 148* 140* 157*   D-Dimer No results for input(s): DDIMER in the last 72 hours. Hgb A1c No results for input(s): HGBA1C in the last 72 hours. Lipid Profile No results for input(s): CHOL, HDL, LDLCALC, TRIG, CHOLHDL, LDLDIRECT in the last 72 hours. Thyroid  function  studies No results for input(s): TSH, T4TOTAL, T3FREE, THYROIDAB in the last 72 hours.  Invalid input(s): FREET3 Anemia work up No results for input(s): VITAMINB12, FOLATE, FERRITIN, TIBC, IRON, RETICCTPCT in the last 72 hours. Urinalysis    Component Value Date/Time   COLORURINE RED (A) 11/24/2023 1054   APPEARANCEUR TURBID (A) 11/24/2023 1054   LABSPEC  11/24/2023 1054    TEST NOT REPORTED DUE TO COLOR INTERFERENCE OF URINE PIGMENT   LABSPEC 1.010 01/19/2006 1117   PHURINE  11/24/2023 1054    TEST NOT REPORTED DUE TO COLOR INTERFERENCE OF URINE PIGMENT   GLUCOSEU (A) 11/24/2023 1054    TEST NOT REPORTED DUE TO COLOR INTERFERENCE OF URINE PIGMENT   HGBUR (A) 11/24/2023 1054    TEST NOT REPORTED DUE TO COLOR INTERFERENCE OF URINE PIGMENT   BILIRUBINUR (A) 11/24/2023 1054    TEST NOT REPORTED DUE TO COLOR INTERFERENCE OF URINE PIGMENT   BILIRUBINUR negative 05/07/2023 1045   BILIRUBINUR Negative 01/19/2006 1117   KETONESUR (A) 11/24/2023 1054    TEST NOT REPORTED DUE TO COLOR INTERFERENCE OF URINE PIGMENT   PROTEINUR (A) 11/24/2023 1054    TEST NOT REPORTED DUE TO COLOR INTERFERENCE OF URINE PIGMENT   UROBILINOGEN 1.0 05/07/2023 1045   UROBILINOGEN 0.2 08/31/2020 1208   NITRITE (A) 11/24/2023 1054    TEST NOT REPORTED DUE TO COLOR INTERFERENCE OF URINE PIGMENT   LEUKOCYTESUR (A) 11/24/2023 1054    TEST NOT REPORTED DUE TO COLOR INTERFERENCE OF URINE PIGMENT   LEUKOCYTESUR Negative 01/19/2006 1117   Sepsis Labs Recent Labs  Lab 11/24/23 0445 11/25/23 0412 11/26/23 0335 11/27/23 0303  WBC 5.3 6.6 9.9 6.5   Microbiology No results found for this or any previous visit (from the past 240 hours).   Time coordinating discharge: Over 30 minutes  SIGNED:   Brayton Lye, MD  [facility] 11/27/2023, 11:51 AM Pager   If 7PM-7AM, please contact night-coverage [email or web address]

## 2023-11-27 NOTE — Progress Notes (Signed)
 AVS completed and ready to print.

## 2023-12-15 ENCOUNTER — Inpatient Hospital Stay (HOSPITAL_COMMUNITY)
Admission: EM | Admit: 2023-12-15 | Discharge: 2023-12-18 | DRG: 811 | Disposition: A | Discharge status: Skilled Nursing Facility | Source: Skilled Nursing Facility | Attending: Internal Medicine | Admitting: Internal Medicine

## 2023-12-15 ENCOUNTER — Other Ambulatory Visit: Payer: Self-pay

## 2023-12-15 ENCOUNTER — Emergency Department (HOSPITAL_COMMUNITY)

## 2023-12-15 DIAGNOSIS — D649 Anemia, unspecified: Secondary | ICD-10-CM | POA: Diagnosis present

## 2023-12-15 DIAGNOSIS — Z923 Personal history of irradiation: Secondary | ICD-10-CM

## 2023-12-15 DIAGNOSIS — F0390 Unspecified dementia without behavioral disturbance: Secondary | ICD-10-CM | POA: Diagnosis present

## 2023-12-15 DIAGNOSIS — I69354 Hemiplegia and hemiparesis following cerebral infarction affecting left non-dominant side: Secondary | ICD-10-CM

## 2023-12-15 DIAGNOSIS — N1832 Chronic kidney disease, stage 3b: Secondary | ICD-10-CM | POA: Diagnosis present

## 2023-12-15 DIAGNOSIS — L89213 Pressure ulcer of right hip, stage 3: Secondary | ICD-10-CM | POA: Diagnosis present

## 2023-12-15 DIAGNOSIS — D631 Anemia in chronic kidney disease: Secondary | ICD-10-CM | POA: Diagnosis present

## 2023-12-15 DIAGNOSIS — K922 Gastrointestinal hemorrhage, unspecified: Secondary | ICD-10-CM

## 2023-12-15 DIAGNOSIS — Z87891 Personal history of nicotine dependence: Secondary | ICD-10-CM | POA: Diagnosis not present

## 2023-12-15 DIAGNOSIS — E1122 Type 2 diabetes mellitus with diabetic chronic kidney disease: Secondary | ICD-10-CM | POA: Diagnosis present

## 2023-12-15 DIAGNOSIS — I129 Hypertensive chronic kidney disease with stage 1 through stage 4 chronic kidney disease, or unspecified chronic kidney disease: Secondary | ICD-10-CM | POA: Diagnosis present

## 2023-12-15 DIAGNOSIS — D500 Iron deficiency anemia secondary to blood loss (chronic): Secondary | ICD-10-CM | POA: Diagnosis not present

## 2023-12-15 DIAGNOSIS — R531 Weakness: Secondary | ICD-10-CM | POA: Diagnosis not present

## 2023-12-15 DIAGNOSIS — E860 Dehydration: Secondary | ICD-10-CM | POA: Diagnosis present

## 2023-12-15 DIAGNOSIS — Z7982 Long term (current) use of aspirin: Secondary | ICD-10-CM

## 2023-12-15 DIAGNOSIS — Z8546 Personal history of malignant neoplasm of prostate: Secondary | ICD-10-CM | POA: Diagnosis not present

## 2023-12-15 DIAGNOSIS — E861 Hypovolemia: Secondary | ICD-10-CM | POA: Diagnosis present

## 2023-12-15 DIAGNOSIS — E785 Hyperlipidemia, unspecified: Secondary | ICD-10-CM | POA: Diagnosis present

## 2023-12-15 DIAGNOSIS — J449 Chronic obstructive pulmonary disease, unspecified: Secondary | ICD-10-CM | POA: Diagnosis present

## 2023-12-15 DIAGNOSIS — F17210 Nicotine dependence, cigarettes, uncomplicated: Secondary | ICD-10-CM | POA: Diagnosis present

## 2023-12-15 DIAGNOSIS — D509 Iron deficiency anemia, unspecified: Principal | ICD-10-CM | POA: Diagnosis present

## 2023-12-15 DIAGNOSIS — K3189 Other diseases of stomach and duodenum: Secondary | ICD-10-CM | POA: Diagnosis not present

## 2023-12-15 LAB — COMPREHENSIVE METABOLIC PANEL WITH GFR
ALT: 15 U/L (ref 0–44)
AST: 17 U/L (ref 15–41)
Albumin: 3 g/dL — ABNORMAL LOW (ref 3.5–5.0)
Alkaline Phosphatase: 53 U/L (ref 38–126)
Anion gap: 11 (ref 5–15)
BUN: 42 mg/dL — ABNORMAL HIGH (ref 8–23)
CO2: 14 mmol/L — ABNORMAL LOW (ref 22–32)
Calcium: 9.2 mg/dL (ref 8.9–10.3)
Chloride: 117 mmol/L — ABNORMAL HIGH (ref 98–111)
Creatinine, Ser: 2.14 mg/dL — ABNORMAL HIGH (ref 0.61–1.24)
GFR, Estimated: 30 mL/min — ABNORMAL LOW (ref >60)
Glucose, Bld: 153 mg/dL — ABNORMAL HIGH (ref 70–99)
Potassium: 4.5 mmol/L (ref 3.5–5.1)
Sodium: 142 mmol/L (ref 135–145)
Total Bilirubin: 0.2 mg/dL (ref 0.0–1.2)
Total Protein: 5.9 g/dL — ABNORMAL LOW (ref 6.5–8.1)

## 2023-12-15 LAB — CBC
HCT: 25.5 % — ABNORMAL LOW (ref 39.0–52.0)
Hemoglobin: 7.4 g/dL — ABNORMAL LOW (ref 13.0–17.0)
MCH: 25.8 pg — ABNORMAL LOW (ref 26.0–34.0)
MCHC: 29 g/dL — ABNORMAL LOW (ref 30.0–36.0)
MCV: 88.9 fL (ref 80.0–100.0)
Platelets: 265 K/uL (ref 150–400)
RBC: 2.87 MIL/uL — ABNORMAL LOW (ref 4.22–5.81)
RDW: 15.9 % — ABNORMAL HIGH (ref 11.5–15.5)
WBC: 5.2 K/uL (ref 4.0–10.5)
nRBC: 0 % (ref 0.0–0.2)

## 2023-12-15 LAB — CBG MONITORING, ED: Glucose-Capillary: 166 mg/dL — ABNORMAL HIGH (ref 70–99)

## 2023-12-15 LAB — I-STAT CHEM 8, ED
BUN: 34 mg/dL — ABNORMAL HIGH (ref 8–23)
Calcium, Ion: 1.33 mmol/L (ref 1.15–1.40)
Chloride: 119 mmol/L — ABNORMAL HIGH (ref 98–111)
Creatinine, Ser: 2.4 mg/dL — ABNORMAL HIGH (ref 0.61–1.24)
Glucose, Bld: 154 mg/dL — ABNORMAL HIGH (ref 70–99)
HCT: 20 % — ABNORMAL LOW (ref 39.0–52.0)
Hemoglobin: 6.8 g/dL — CL (ref 13.0–17.0)
Potassium: 4.3 mmol/L (ref 3.5–5.1)
Sodium: 144 mmol/L (ref 135–145)
TCO2: 15 mmol/L — ABNORMAL LOW (ref 22–32)

## 2023-12-15 LAB — POC OCCULT BLOOD, ED: Fecal Occult Bld: POSITIVE — AB

## 2023-12-15 LAB — CK: Total CK: 38 U/L — ABNORMAL LOW (ref 49–397)

## 2023-12-15 MED ORDER — SODIUM CHLORIDE 0.9% IV SOLUTION
Freq: Once | INTRAVENOUS | Status: AC
Start: 2023-12-16 — End: 2023-12-16

## 2023-12-15 MED ORDER — LACTATED RINGERS IV SOLN: INTRAVENOUS | Status: AC

## 2023-12-15 MED ORDER — PANTOPRAZOLE SODIUM 40 MG IV SOLR
40.0000 mg | Freq: Two times a day (BID) | INTRAVENOUS | Status: DC
  Administered 2023-12-16 (×2): 40 mg via INTRAVENOUS
  Filled 2023-12-15 (×3): qty 10

## 2023-12-15 MED ORDER — LACTATED RINGERS IV BOLUS
1000.0000 mL | Freq: Once | INTRAVENOUS | Status: AC
  Administered 2023-12-15: 1000 mL via INTRAVENOUS

## 2023-12-15 MED ORDER — ONDANSETRON HCL 4 MG PO TABS: 4.0000 mg | ORAL_TABLET | Freq: Four times a day (QID) | ORAL | Status: DC | PRN

## 2023-12-15 MED ORDER — ONDANSETRON HCL 4 MG/2ML IJ SOLN: 4.0000 mg | Freq: Four times a day (QID) | INTRAMUSCULAR | Status: DC | PRN

## 2023-12-15 MED ORDER — ACETAMINOPHEN 325 MG PO TABS
650.0000 mg | ORAL_TABLET | Freq: Four times a day (QID) | ORAL | Status: DC | PRN
  Administered 2023-12-15: 650 mg via ORAL
  Filled 2023-12-15: qty 2

## 2023-12-15 MED ORDER — ACETAMINOPHEN 650 MG RE SUPP: 650.0000 mg | Freq: Four times a day (QID) | RECTAL | Status: DC | PRN

## 2023-12-15 MED ORDER — SENNOSIDES-DOCUSATE SODIUM 8.6-50 MG PO TABS: 1.0000 | ORAL_TABLET | Freq: Every evening | ORAL | Status: DC | PRN

## 2023-12-15 MED ORDER — BISACODYL 5 MG PO TBEC: 5.0000 mg | DELAYED_RELEASE_TABLET | Freq: Every day | ORAL | Status: DC | PRN

## 2023-12-15 MED ORDER — ROSUVASTATIN CALCIUM 20 MG PO TABS
20.0000 mg | ORAL_TABLET | Freq: Every day | ORAL | Status: DC
  Administered 2023-12-16 – 2023-12-18 (×3): 20 mg via ORAL
  Filled 2023-12-15 (×3): qty 1

## 2023-12-15 NOTE — H&P (Addendum)
 History and Physical  Peter Mann FMW:[id number] DOB: 27-Jul-1940 DOA: 12/15/2023  PCP: [name], MD   Chief Complaint: Lethargy, hypotension  HPI: Peter Mann is a 83 y.o. male with medical history significant for dementia, CVA with left-sided weakness, diabetes mellitus type 2, COPD, hypertension, chronic anemia, CKD stage IIIB, history of prostate cancer who presented from [facility] for evaluation of lethargy and hypotension.  On chart review, EMS was called to patient's facility after he suddenly became lethargic after participating in PT. He was found to have a BP of 76/48 at the facility. EMS administered 500 cc of IVF with improvement in BP to 116/60. During evaluation, patient reports he slipped and fell on a stool yesterday. He endorsed fatigue and mild abdominal discomfort but no nausea, vomiting reported fever, chills, chest pain or shortness of breath. He is unsure if he has had any bloody or black stools.  On chart review, patient had a recent hospitalization at [facility] from 9/9 to 9/19 for AKI in the setting of nontraumatic rhabdomyolysis.  He was discharged to [facility] for rehab.  ED Course: Initial vitals show temp 98.6, RR 20, HR 70, BP 103/55. Initial labs significant for Hgb 7.4, BUNs/creatinine 42/2.14, bicarb 14, glucose 153, CK 38 and positive fecal occult test. 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  Pt received IV LR 1 L bolus. TRH was consulted for admission. EDP asked to consult GI for evaluation.  Review of Systems: Please see HPI for pertinent positives and negatives. A complete 10 system review of systems are otherwise negative.  Past Medical History:  Diagnosis Date   At low risk for fall    Cancer (HCC)    prostrate ca, radiation treatments   Chest pain    COPD (chronic obstructive pulmonary disease) (HCC)    Dementia (HCC)    Diabetes mellitus without complication (HCC)    Dizziness     History of tobacco abuse    Hyperlipidemia    Hypertension    Stroke [facility] Fall)    Subconjunctival hemorrhage of left eye 07/18/2019   The condition of the involved eye is that of a subconjunctival hemorrhage.  These are often spontaneous but are also often at or near the site of low location of an injection should to be placed into the eye.  In the absence of direct blunt or severe trauma these will typically resolve on their own spontaneously and have no impact on the vision.    These are very similar to having a bruise in your   Past Surgical History:  Procedure Laterality Date   CHOLECYSTECTOMY N/A 01/30/2014   Procedure: LAPAROSCOPIC CHOLECYSTECTOMY;  Surgeon: [name], MD;  Location: [facility] OR;  Service: General;  Laterality: N/A;   Social History:  reports that he quit smoking about 5 years ago. His smoking use included cigarettes. He has never used smokeless tobacco. He reports that he does not drink alcohol  and does not use drugs.  No Known Allergies  Family History  Problem Relation Age of Onset   Cancer Brother    Cancer Sister      Prior to Admission medications   Medication Sig Start Date End Date Taking? Authorizing Provider  ALPHAGAN  P 0.1 % SOLN Place 1 drop into both eyes 2 (two) times daily. 04/01/23   [provider]  amLODipine  (NORVASC ) 10 MG tablet Take 10 mg by mouth daily.    [provider]  aspirin  EC 81  MG tablet Take 1 tablet (81 mg total) by mouth daily. Swallow whole. 11/28/23   Elgergawy, Brayton RAMAN, MD  carvedilol  (COREG ) 6.25 MG tablet Take 1 tablet (6.25 mg total) by mouth 2 (two) times daily with a meal. 11/27/23   Elgergawy, Brayton RAMAN, MD  CEQUA  0.09 % SOLN Place 1 drop into both eyes 2 (two) times daily.    [provider]  cyanocobalamin (VITAMIN B12) 1000 MCG tablet Take 1 tablet (1,000 mcg total) by mouth daily. 10/06/23   Camara, Amadou, MD  dexlansoprazole (DEXILANT) 60 MG capsule Take 60 mg by mouth daily.    [provider]  diclofenac sodium (VOLTAREN) 1 % GEL Apply 2 g topically as needed (pain).     [provider]  docusate sodium  (COLACE) 100 MG capsule Take 2 capsules (200 mg total) by mouth 2 (two) times daily. 11/27/23   Elgergawy, Brayton RAMAN, MD  ferrous sulfate  325 (65 FE) MG tablet Take 1 tablet (325 mg total) by mouth 2 (two) times daily with a meal. 11/27/23   Elgergawy, Brayton RAMAN, MD  folic acid  (FOLVITE ) 1 MG tablet Take 1 tablet (1 mg total) by mouth daily. 11/28/23   Elgergawy, Brayton RAMAN, MD  hydrALAZINE  (APRESOLINE ) 50 MG tablet Take 1 tablet (50 mg total) by mouth every 8 (eight) hours. 11/27/23   Elgergawy, Brayton RAMAN, MD  insulin  aspart (NOVOLOG ) 100 UNIT/ML injection Inject 0-9 Units into the skin 3 (three) times daily with meals. insulin  aspart (novoLOG ) injection 0-9 Units  0-9 Units, Subcutaneous, 3 times daily with meals, First dose on Wed 11/18/23 at 0800 Correction coverage: Sensitive (thin, NPO, renal) CBG < 70: Implement Hypoglycemia Standing Orders and refer to Hypoglycemia Standing Orders sidebar report CBG 70 - 120: 0 units CBG 121 - 150: 1 unit CBG 151 - 200: 2 units CBG 201 - 250: 3 units CBG 251 - 300: 5 units CBG 301 - 350: 7 units CBG 351 - 400: 9 units CBG > 400: call MD 11/27/23   Elgergawy, Brayton RAMAN, MD  isosorbide  mononitrate (IMDUR ) 30 MG 24 hr tablet Take 1 tablet (30 mg total) by mouth daily. 05/12/23   Singh, Prashant K, MD  LINZESS  290 MCG CAPS capsule Take 290 mcg by mouth daily.    [provider]  lubiprostone (AMITIZA) 24 MCG capsule Take 24 mcg by mouth 2 (two) times daily with a meal.    [provider]  polyethylene glycol (MIRALAX ) 17 g packet Take 17 g by mouth daily as needed for moderate constipation or mild constipation (if no bowel movement in 2 days). 05/15/22   Blaise Aleene KIDD, MD  rosuvastatin  (CRESTOR ) 20 MG tablet Take 20 mg by mouth daily.    [provider]  senna-docusate (SENOKOT-S) 8.6-50 MG tablet Take 1 tablet by  mouth at bedtime. 05/15/22   LampteyAleene KIDD, MD  tamsulosin  (FLOMAX ) 0.4 MG CAPS capsule Take 0.4 mg by mouth daily.     [provider]    Physical Exam: BP (!) 117/54 (BP Location: Right Arm)   Pulse 70   Temp 98.5 F (36.9 C)   Resp 20   SpO2 100%  General: Pleasant, weak appearing elderly man laying in bed. No acute distress. HEENT: [place]/AT. Anicteric sclera. Dry mucous membrane. CV: RRR. No murmurs, rubs, or gallops. No LE edema Pulmonary: Lungs CTAB. Normal effort. No wheezing or rales. Abdominal: Soft, nondistended. Mild suprapubic tenderness. Normal bowel sounds. Extremities: Palpable radial and DP pulses. Normal ROM. Skin:  Warm and dry. 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 Moves all extremities. Normal sensation to light touch. No focal deficit. Psych: Normal mood and affect          Labs on Admission:  Basic Metabolic Panel: Recent Labs  Lab 12/15/23 1450 12/15/23 1518  NA 144 142  K 4.3 4.5  CL 119* 117*  CO2  --  14*  GLUCOSE 154* 153*  BUN 34* 42*  CREATININE 2.40* 2.14*  CALCIUM   --  9.2   Liver Function Tests: Recent Labs  Lab 12/15/23 1518  AST 17  ALT 15  ALKPHOS 53  BILITOT 0.2  PROT 5.9*  ALBUMIN 3.0*   No results for input(s): LIPASE, AMYLASE in the last 168 hours. No results for input(s): AMMONIA in the last 168 hours. CBC: Recent Labs  Lab 12/15/23 1450 12/15/23 1518  WBC  --  5.2  HGB 6.8* 7.4*  HCT 20.0* 25.5*  MCV  --  88.9  PLT  --  265   Cardiac Enzymes: Recent Labs  Lab 12/15/23 1753  CKTOTAL 38*   BNP (last 3 results) Recent Labs    05/09/23 0550  BNP 80.0    ProBNP (last 3 results) No results for input(s): PROBNP in the last 8760 hours.  CBG: Recent Labs  Lab 12/15/23 1458  GLUCAP 166*    Radiological Exams on Admission: DG Hip Unilat W or Wo Pelvis 2-3 Views Right Result Date: 12/15/2023 CLINICAL DATA:  Wound. EXAM: DG HIP  (WITH OR WITHOUT PELVIS) 2-3V RIGHT COMPARISON:  None Available. FINDINGS: There is no evidence of hip fracture or dislocation. Mild narrowing and osteophyte formation of right hip is noted. IMPRESSION: Mild degenerative joint disease of right hip. No acute abnormality seen. Electronically Signed   By: Lynwood Landy Raddle M.D.   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 male with medical history significant for dementia, CVA with left-sided weakness, diabetes mellitus type 2, COPD, hypertension, chronic anemia, CKD stage IIIB, history of prostate cancer who presented from [facility] for evaluation of lethargy and hypotension and admitted for symptomatic anemia.  # Symptomatic anemia # Acute on chronic anemia # ?GI bleed - Hgb of 7.4 on admission from baseline of 9-10 - Pt presented with acute onset of lethargy and hypotension, patient unsure if he has had any melena or hematochezia - Secure chat sent to Dr. Nazim, [facility]) for GI consult during the day - FOBT positive, Pt hemodynamically stable - Transfuse 1 unit PRBC - IV Protonix  40 mg every 12 hours - Follow up iron studies, ferritin and vitamin B12 - Trend CBC and Transfuse for Hgb goal > 7  # Hypotension, resolved - Patient presented due to hypotension at facility with SBP in the 70s, improved after 500 cc bolus by EMS - Initial BP of 103/55 however SBP now in the 1 10-1 40s after IVLR 1 L bolus - Continue IV LR 100 cc/h - Hold BP meds for today, resume as appropriate  # CKD 3B - Creatinine of 2.14 around baseline of 1.8-2.2 - Trend renal function, avoid nephrotoxic agents  # History of CVA # HLD - Continue rosuvastatin   # Chronic wounds, POA # Pressure ulcer, POA - Patient found to have multiple chronic wounds including stage III pressure ulcer on right hip - Wound care consulted, appreciate  assistance  # Dementia -  Patient is alert and oriented x 3 - Delirium precautions  # Hx of prostate cancer - Status post radiation  # Generalized weakness - In the setting of acute blood loss and hypotension - PT/OT eval and treat  DVT prophylaxis: SCDs    Code Status: Full Code  Consults called: GI  Family Communication: Unable to reach daughter via phone  Severity of Illness: The appropriate patient status for this patient is INPATIENT. Inpatient status is judged to be reasonable and necessary in order to provide the required intensity of service to ensure the patient's safety. The patient's presenting symptoms, physical exam findings, and initial radiographic and laboratory data in the context of their chronic comorbidities is felt to place them at high risk for further clinical deterioration. Furthermore, it is not anticipated that the patient will be medically stable for discharge from the hospital within 2 midnights of admission.   * I certify that at the point of admission it is my clinical judgment that the patient will require inpatient hospital care spanning beyond 2 midnights from the point of admission due to high intensity of service, high risk for further deterioration and high frequency of surveillance required.*  Level of care: Telemetry   I personally spent a total of 75 minutes in the care of the patient today including preparing to see the patient, getting/reviewing separately obtained history, performing a medically appropriate exam/evaluation, placing orders, referring and communicating with other health care professionals, documenting clinical information in the EHR, and independently interpreting results.   Lou Claretta HERO, MD 12/15/2023, 11:42 PM [facility] Pager: [phone] Isaiah 41:10   If 7PM-7AM, please contact night-coverage [email or web address] Password [email or web address]

## 2023-12-15 NOTE — ED Triage Notes (Signed)
 BIBA from [facility] for report of more lethargic than normal- 76/54 BP per facility. 500 cc given PTA  902 syst initial, 116/60 after fluids 177 cbg

## 2023-12-15 NOTE — ED Provider Notes (Signed)
 [place] EMERGENCY DEPARTMENT AT [facility] Provider Note   CSN: [id number] Arrival date & time: 12/15/23  1356     Patient presents with: Fatigue   Peter Mann is a 83 y.o. male.   Peter Mann is an 83 yo male presenting from [place] facility via EMS after episode of hypotension and lethargy this AM. Patient is alert to self, place, month, and year on initial evaluation. Patient reports he has been falling more recently, thinks last fall was a few weeks ago. Patient states he may have pain with urination and may be urinating more frequently.  Called patient's residential facility [place] and spoke with bedside RN: Per report, patient ate breakfast, was participating in PT, then became lethargic and started staring off and responding  ery lethargic with slurred speech, bp was 76/48 in L arm.  RN noted that has a sacral wound since his arrival from the hospital to [facility] 3 weeks ago. At baseline patient is A&Ox3, has a strong voice, no weakness, able move self around using wheelchair, has no dysarthria, and has intelligible speech. Of note, RN confirms that patient did get his home hydralazine , carvedilol , Imdur , and amlodipine  this AM, but no extra doses administered. Reportedly patient has adequate oral intake, but may not be hydrating as frequently as he should.  Patient was recently hospitalized at [facility] from 9/9 - 9/19 for AKI 2/2 nontraumatic rhabdomyolysis and was discharged directly to [facility] in stable condition for rehab.      Prior to Admission medications   Medication Sig Start Date End Date Taking? Authorizing Provider  ALPHAGAN  P 0.1 % SOLN Place 1 drop into both eyes 2 (two) times daily. 04/01/23   [provider]  amLODipine  (NORVASC ) 10 MG tablet Take 10 mg by mouth daily.    [provider]  aspirin  EC 81 MG tablet Take 1 tablet (81 mg total) by mouth daily. Swallow whole. 11/28/23   Elgergawy, Brayton RAMAN, MD  carvedilol   (COREG ) 6.25 MG tablet Take 1 tablet (6.25 mg total) by mouth 2 (two) times daily with a meal. 11/27/23   Elgergawy, Brayton RAMAN, MD  CEQUA  0.09 % SOLN Place 1 drop into both eyes 2 (two) times daily.    [provider]  cyanocobalamin (VITAMIN B12) 1000 MCG tablet Take 1 tablet (1,000 mcg total) by mouth daily. 10/06/23   Camara, Amadou, MD  dexlansoprazole (DEXILANT) 60 MG capsule Take 60 mg by mouth daily.    [provider]  diclofenac sodium (VOLTAREN) 1 % GEL Apply 2 g topically as needed (pain).     [provider]  docusate sodium  (COLACE) 100 MG capsule Take 2 capsules (200 mg total) by mouth 2 (two) times daily. 11/27/23   Elgergawy, Brayton RAMAN, MD  ferrous sulfate  325 (65 FE) MG tablet Take 1 tablet (325 mg total) by mouth 2 (two) times daily with a meal. 11/27/23   Elgergawy, Brayton RAMAN, MD  folic acid  (FOLVITE ) 1 MG tablet Take 1 tablet (1 mg total) by mouth daily. 11/28/23   Elgergawy, Brayton RAMAN, MD  hydrALAZINE  (APRESOLINE ) 50 MG tablet Take 1 tablet (50 mg total) by mouth every 8 (eight) hours. 11/27/23   Elgergawy, Brayton RAMAN, MD  insulin  aspart (NOVOLOG ) 100 UNIT/ML injection Inject 0-9 Units into the skin 3 (three) times daily with meals. insulin  aspart (novoLOG ) injection 0-9 Units  0-9 Units, Subcutaneous, 3 times daily with meals, First dose on Wed 11/18/23 at 0800 Correction coverage: Sensitive (thin, NPO, renal) CBG <  70: Implement Hypoglycemia Standing Orders and refer to Hypoglycemia Standing Orders sidebar report CBG 70 - 120: 0 units CBG 121 - 150: 1 unit CBG 151 - 200: 2 units CBG 201 - 250: 3 units CBG 251 - 300: 5 units CBG 301 - 350: 7 units CBG 351 - 400: 9 units CBG > 400: call MD 11/27/23   Elgergawy, Brayton RAMAN, MD  isosorbide  mononitrate (IMDUR ) 30 MG 24 hr tablet Take 1 tablet (30 mg total) by mouth daily. 05/12/23   Singh, Prashant K, MD  LINZESS  290 MCG CAPS capsule Take 290 mcg by mouth daily.    [provider]  lubiprostone (AMITIZA) 24 MCG  capsule Take 24 mcg by mouth 2 (two) times daily with a meal.    [provider]  polyethylene glycol (MIRALAX ) 17 g packet Take 17 g by mouth daily as needed for moderate constipation or mild constipation (if no bowel movement in 2 days). 05/15/22   LampteyAleene KIDD, MD  rosuvastatin  (CRESTOR ) 20 MG tablet Take 20 mg by mouth daily.    [provider]  senna-docusate (SENOKOT-S) 8.6-50 MG tablet Take 1 tablet by mouth at bedtime. 05/15/22   Blaise Aleene KIDD, MD  tamsulosin  (FLOMAX ) 0.4 MG CAPS capsule Take 0.4 mg by mouth daily.     [provider]    Allergies: Patient has no known allergies.    Review of Systems  Constitutional:  Negative for chills and fever.  HENT:  Negative for ear pain and sore throat.   Eyes:  Negative for pain and visual disturbance.  Respiratory:  Negative for cough and shortness of breath.   Cardiovascular:  Negative for chest pain and palpitations.  Gastrointestinal:  Negative for abdominal pain and vomiting.  Genitourinary:  Positive for frequency and urgency. Negative for decreased urine volume, dysuria and hematuria.  Musculoskeletal:  Negative for arthralgias and back pain.  Skin:  Negative for color change and rash.  Neurological:  Positive for weakness and light-headedness. Negative for seizures, syncope and speech difficulty.  All other systems reviewed and are negative.   Updated Vital Signs There were no vitals taken for this visit.  Physical Exam Vitals and nursing note reviewed.  Constitutional:      General: He is not in acute distress.    Appearance: Normal appearance. He is well-developed.  HENT:     Head: Normocephalic and atraumatic.     Nose: Nose normal.     Mouth/Throat:     Mouth: Mucous membranes are dry.  Eyes:     Conjunctiva/sclera: Conjunctivae normal.  Cardiovascular:     Rate and Rhythm: Normal rate and regular rhythm.     Heart sounds: No murmur heard. Pulmonary:     Effort: Pulmonary effort is  normal. No respiratory distress.     Breath sounds: Normal breath sounds.  Abdominal:     General: Abdomen is flat. Bowel sounds are normal.     Palpations: Abdomen is soft. There is no mass.     Tenderness: There is abdominal tenderness (suprapubic). There is no guarding or rebound.     Hernia: No hernia is present.  Genitourinary:    Rectum: Normal. Guaiac result positive (no melanotic stool in rectal vault).  Musculoskeletal:        General: No swelling.     Cervical back: Neck supple.  Skin:    General: Skin is warm and dry.     Capillary Refill: Capillary refill takes 2 to 3 seconds.  Findings: Wound (right hip stage 3 pressure ulcer ~10 cm in diameter present with protruding white tissue noted) present.  Neurological:     General: No focal deficit present.     Mental Status: He is alert and oriented to person, place, and time.     Cranial Nerves: Cranial nerves 2-12 are intact. No cranial nerve deficit.     Motor: No weakness.  Psychiatric:        Mood and Affect: Mood normal.     (all labs ordered are listed, but only abnormal results are displayed) Labs Reviewed - No data to display  EKG: None  Radiology: No results found.   Procedures   Medications Ordered in the ED - No data to display                                  Medical Decision Making Peter Mann is an 83 yo with pertinent PMH of T2DM, Hx stroke, HTN, dementia, and COPD presenting with worsened lethargy and hypotensive episode at his [place] facility.  Initially considered infectious encephalopathy, sepsis, UTI, stroke, dehydration, and anti-hypertensive medication polypharmacy.  BP was soft but MAP consistently >65 after arrival.  1 L LR bolus ordered.  Reassuringly, patient was initially afebrile, including on repeat rectal temp.  No suspicion on sepsis given these vitals.  Patient was alert and oriented x3 on initial evaluation with unremarkable neurological exam, no suspicion for stroke.   Extensive right hip wound consistent with stage III pressure ulcer was noted and the radiography of right hip was obtained to rule out osteomyelitis.  I-STAT showed elevated creatinine to 2.4, Hgb 6.8.  Type and screen ordered and monitoring for official CBC, would transfuse below Hgb 7.  Point-of-care Hemoccult positive.  CMP, CBC, CK, UA, right hip XR, and EKG pending at time of my signout to Dr. Patsey.  Amount and/or Complexity of Data Reviewed Labs: ordered. Decision-making details documented in ED Course. Radiology: ordered. Decision-making details documented in ED Course. ECG/medicine tests: ordered. Decision-making details documented in ED Course.      Final diagnoses:  None    ED Discharge Orders     None        Jalien Weakland, MD 12/15/23 1542    Dasie Faden, MD 12/16/23 [phone]

## 2023-12-15 NOTE — ED Provider Notes (Signed)
 I saw and evaluated the patient, reviewed the resident's note and I agree with the findings and plan.  EKG Interpretation Date/Time:  Tuesday December 15 2023 14:31:35 EDT Ventricular Rate:  71 PR Interval:  192 QRS Duration:  106 QT Interval:  363 QTC Calculation: 395 R Axis:   -43  Text Interpretation: Sinus rhythm Left anterior fascicular block Abnormal R-wave progression, late transition No significant change since last tracing Confirmed by Dasie Faden ([id number]) on 12/15/2023 2:47:17 PM   This is 83 year old male with history dementia presents for nursing home due to altered mental status.  Patient is found to be hypotensive blood pressure 70.  Given fluids.  His mental status improved.  On exam, he is alert and oriented x 3.  No focal neurological deficits.  Awaiting workup   Dasie Faden, MD 12/15/23 [phone]

## 2023-12-16 ENCOUNTER — Encounter (HOSPITAL_COMMUNITY): Payer: Self-pay | Admitting: Student

## 2023-12-16 DIAGNOSIS — D649 Anemia, unspecified: Secondary | ICD-10-CM | POA: Diagnosis not present

## 2023-12-16 DIAGNOSIS — E860 Dehydration: Secondary | ICD-10-CM

## 2023-12-16 DIAGNOSIS — N1832 Chronic kidney disease, stage 3b: Secondary | ICD-10-CM

## 2023-12-16 DIAGNOSIS — K922 Gastrointestinal hemorrhage, unspecified: Secondary | ICD-10-CM | POA: Diagnosis not present

## 2023-12-16 DIAGNOSIS — Z8546 Personal history of malignant neoplasm of prostate: Secondary | ICD-10-CM

## 2023-12-16 DIAGNOSIS — E861 Hypovolemia: Secondary | ICD-10-CM

## 2023-12-16 LAB — CBC
HCT: 23.8 % — ABNORMAL LOW (ref 39.0–52.0)
Hemoglobin: 7.2 g/dL — ABNORMAL LOW (ref 13.0–17.0)
MCH: 26 pg (ref 26.0–34.0)
MCHC: 30.3 g/dL (ref 30.0–36.0)
MCV: 85.9 fL (ref 80.0–100.0)
Platelets: 266 K/uL (ref 150–400)
RBC: 2.77 MIL/uL — ABNORMAL LOW (ref 4.22–5.81)
RDW: 15.8 % — ABNORMAL HIGH (ref 11.5–15.5)
WBC: 3.5 K/uL — ABNORMAL LOW (ref 4.0–10.5)
nRBC: 0 % (ref 0.0–0.2)

## 2023-12-16 LAB — URINALYSIS, ROUTINE W REFLEX MICROSCOPIC
Bilirubin Urine: NEGATIVE
Glucose, UA: NEGATIVE mg/dL
Hgb urine dipstick: NEGATIVE
Ketones, ur: NEGATIVE mg/dL
Nitrite: NEGATIVE
Protein, ur: 100 mg/dL — AB
Specific Gravity, Urine: 1.016 (ref 1.005–1.030)
WBC, UA: >50 WBC/hpf (ref 0–5)
pH: 7 (ref 5.0–8.0)

## 2023-12-16 LAB — FERRITIN: Ferritin: 279 ng/mL (ref 24–336)

## 2023-12-16 LAB — BASIC METABOLIC PANEL WITH GFR
Anion gap: 11 (ref 5–15)
BUN: 42 mg/dL — ABNORMAL HIGH (ref 8–23)
CO2: 16 mmol/L — ABNORMAL LOW (ref 22–32)
Calcium: 9.5 mg/dL (ref 8.9–10.3)
Chloride: 115 mmol/L — ABNORMAL HIGH (ref 98–111)
Creatinine, Ser: 1.89 mg/dL — ABNORMAL HIGH (ref 0.61–1.24)
GFR, Estimated: 35 mL/min — ABNORMAL LOW (ref >60)
Glucose, Bld: 105 mg/dL — ABNORMAL HIGH (ref 70–99)
Potassium: 4.1 mmol/L (ref 3.5–5.1)
Sodium: 142 mmol/L (ref 135–145)

## 2023-12-16 LAB — PREPARE RBC (CROSSMATCH)

## 2023-12-16 LAB — HEMOGLOBIN AND HEMATOCRIT, BLOOD
HCT: 28.2 % — ABNORMAL LOW (ref 39.0–52.0)
Hemoglobin: 8.6 g/dL — ABNORMAL LOW (ref 13.0–17.0)

## 2023-12-16 LAB — IRON AND TIBC
Iron: 15 ug/dL — ABNORMAL LOW (ref 45–182)
Saturation Ratios: 7 % — ABNORMAL LOW (ref 17.9–39.5)
TIBC: 210 ug/dL — ABNORMAL LOW (ref 250–450)
UIBC: 195 ug/dL

## 2023-12-16 LAB — VITAMIN B12: Vitamin B-12: 668 pg/mL (ref 180–914)

## 2023-12-16 LAB — MRSA NEXT GEN BY PCR, NASAL: MRSA by PCR Next Gen: NOT DETECTED

## 2023-12-16 MED ORDER — ORAL CARE MOUTH RINSE: 15.0000 mL | OROMUCOSAL | Status: DC | PRN

## 2023-12-16 MED ORDER — MEDIHONEY WOUND/BURN DRESSING EX PSTE: 1.0000 | PASTE | Freq: Every day | CUTANEOUS | Status: DC

## 2023-12-16 MED ORDER — FOLIC ACID 1 MG PO TABS
1.0000 mg | ORAL_TABLET | Freq: Every day | ORAL | Status: DC
  Administered 2023-12-16 – 2023-12-18 (×3): 1 mg via ORAL
  Filled 2023-12-16 (×3): qty 1

## 2023-12-16 MED ORDER — TAMSULOSIN HCL 0.4 MG PO CAPS
0.4000 mg | ORAL_CAPSULE | Freq: Every day | ORAL | Status: DC
  Administered 2023-12-16 – 2023-12-18 (×3): 0.4 mg via ORAL
  Filled 2023-12-16 (×3): qty 1

## 2023-12-16 MED ORDER — SODIUM CHLORIDE 0.9% IV SOLUTION
Freq: Once | INTRAVENOUS | Status: DC
Start: 2023-12-16 — End: 2023-12-18

## 2023-12-16 MED ORDER — LUBIPROSTONE 24 MCG PO CAPS
24.0000 ug | ORAL_CAPSULE | Freq: Two times a day (BID) | ORAL | Status: DC
  Administered 2023-12-16 – 2023-12-18 (×4): 24 ug via ORAL
  Filled 2023-12-16 (×5): qty 1

## 2023-12-16 MED ORDER — DOCUSATE SODIUM 100 MG PO CAPS
200.0000 mg | ORAL_CAPSULE | Freq: Two times a day (BID) | ORAL | Status: DC
  Administered 2023-12-17 – 2023-12-18 (×2): 200 mg via ORAL
  Filled 2023-12-16 (×3): qty 2

## 2023-12-16 MED ORDER — CYCLOSPORINE (PF) 0.09 % OP SOLN: 1.0000 [drp] | Freq: Two times a day (BID) | OPHTHALMIC | Status: DC

## 2023-12-16 MED ORDER — VITAMIN B-12 1000 MCG PO TABS
1000.0000 ug | ORAL_TABLET | Freq: Every day | ORAL | Status: DC
  Administered 2023-12-16 – 2023-12-18 (×3): 1000 ug via ORAL
  Filled 2023-12-16 (×3): qty 1

## 2023-12-16 MED ORDER — BRIMONIDINE TARTRATE 0.15 % OP SOLN
1.0000 [drp] | Freq: Two times a day (BID) | OPHTHALMIC | Status: DC
  Administered 2023-12-16 – 2023-12-18 (×4): 1 [drp] via OPHTHALMIC
  Filled 2023-12-16: qty 5

## 2023-12-16 MED ORDER — CARVEDILOL 6.25 MG PO TABS
6.2500 mg | ORAL_TABLET | Freq: Two times a day (BID) | ORAL | Status: DC
  Administered 2023-12-16 – 2023-12-18 (×3): 6.25 mg via ORAL
  Filled 2023-12-16 (×3): qty 1

## 2023-12-16 MED ORDER — SODIUM CHLORIDE 0.9% IV SOLUTION: Freq: Once | INTRAVENOUS | Status: DC

## 2023-12-16 MED ORDER — COLLAGENASE 250 UNIT/GM EX OINT
TOPICAL_OINTMENT | Freq: Every day | CUTANEOUS | Status: DC
  Filled 2023-12-16: qty 30

## 2023-12-16 MED ORDER — IRON SUCROSE 200 MG IVPB - SIMPLE MED
200.0000 mg | Freq: Once | Status: AC
  Administered 2023-12-16: 200 mg via INTRAVENOUS
  Filled 2023-12-16: qty 200
  Filled 2023-12-16: qty 110

## 2023-12-16 MED ORDER — CYCLOSPORINE 0.05 % OP EMUL
1.0000 [drp] | Freq: Two times a day (BID) | OPHTHALMIC | Status: DC
  Administered 2023-12-16 – 2023-12-18 (×4): 1 [drp] via OPHTHALMIC
  Filled 2023-12-16 (×5): qty 30

## 2023-12-16 NOTE — Evaluation (Signed)
 Occupational Therapy Evaluation Patient Details Name: Peter Mann MRN: [id number] DOB: January 05, 1941 Today's Date: 12/16/2023   History of Present Illness   PT is an 83 yo male admitted from [facility]F with symptomatc anemia, lethargy and hypotension. Hgb was 7.4 on admission and after transfusion is at 8.6. PMH: admit in early Sept for fall at home with resulting rhabdo and non specified back fxs, many pressure wounds, CVA, CA, DM, COPD, CKD III, dementia     Clinical Impressions Pt admitted with the above diagnosis and has the deficits outlined below. Pt would benefit from cont OT to increase independence with basic adls back to baseline before he went to [place]. Pt has been at [facility] for 2 weeks post fall in his home and has been walking short distances with PT and walker and working on adls.  Pt is not ready to go home as pt lives alone. Recommend less than 3 hours of therapy a day to continue prior to returning home. Due to pt's dementia and recent history of falls, family may want to consider what the plan for this pt should be long term as far as living alone.  Pt was wearing a TLSO brace at [facility] and have not seen this brace in room.  Will check with nursing to see if this can be brought in.  Will see acutely and focus on balance and LE adls.     If plan is discharge home, recommend the following:   A lot of help with walking and/or transfers;A lot of help with bathing/dressing/bathroom;Assistance with cooking/housework;Direct supervision/assist for medications management;Direct supervision/assist for financial management;Assist for transportation;Help with stairs or ramp for entrance     Functional Status Assessment   Patient has had a recent decline in their functional status and demonstrates the ability to make significant improvements in function in a reasonable and predictable amount of time.     Equipment Recommendations    (tbd)     Recommendations for  Other Services         Precautions/Restrictions   Precautions Precautions: Fall;Back Recall of Precautions/Restrictions: Impaired Precaution/Restrictions Comments: verbally reviewed back precautions; pt verbalized understanding, but requiring Max-Total cues to adhere to back precautions during tasks Required Braces or Orthoses:  (no spinal brace in room) Restrictions Weight Bearing Restrictions Per Provider Order: No Other Position/Activity Restrictions: back precautions     Mobility Bed Mobility Overal bed mobility: Needs Assistance Bed Mobility: Rolling, Sidelying to Sit, Sit to Supine Rolling: Min assist, Used rails Sidelying to sit: Mod assist, Used rails   Sit to supine: Min assist, Used rails (did not follow back precautions)   General bed mobility comments: Pt required assist to get to a full sitting position, Pt required cues for back precautions.    Transfers Overall transfer level: Needs assistance Equipment used: Rolling walker (2 wheels), 1 person hand held assist Transfers: Sit to/from Stand, Bed to chair/wheelchair/BSC Sit to Stand: Mod assist, From elevated surface     Step pivot transfers: Mod assist     General transfer comment: Pt needed cues for hand placement to stand as well as mod assist to power up from EOB.  Pt needed cues to use RW and stand tall once up as he wanted to maintain flexed posture. Min A for balance throughout. Cues to folllow back precautions      Balance Overall balance assessment: Needs assistance Sitting-balance support: Feet supported, Bilateral upper extremity supported Sitting balance-Leahy Scale: Fair Sitting balance - Comments: sat in forward posture on  EOB   Standing balance support: Bilateral upper extremity supported, During functional activity, Reliant on assistive device for balance Standing balance-Leahy Scale: Poor Standing balance comment: Reliant on at least min asisst to maintain standing.                            ADL either performed or assessed with clinical judgement   ADL Overall ADL's : Needs assistance/impaired Eating/Feeding: Minimal assistance;Sitting Eating/Feeding Details (indicate cue type and reason): Pt feeds self much better sitting in chair. Nursing notified Grooming: Wash/dry hands;Wash/dry face;Set up;Sitting Grooming Details (indicate cue type and reason): Pt sat EOB to groom Upper Body Bathing: Minimal assistance;Sitting   Lower Body Bathing: Maximal assistance;Sit to/from stand;Cueing for compensatory techniques   Upper Body Dressing : Sitting;Minimal assistance   Lower Body Dressing: Maximal assistance;Sit to/from stand;Cueing for compensatory techniques Lower Body Dressing Details (indicate cue type and reason): Pt with decreased balance in standing. Pt must hold to walker while second person assists to pull pants up and fasten. Pt required mod assist to donn socks and is not following any back precautions.  Will follow up with nursing about back precuations and location of brace. Toilet Transfer: Moderate assistance;BSC/3in1;Rolling walker (2 wheels);Cueing for safety;Cueing for sequencing Toilet Transfer Details (indicate cue type and reason): Pt takes shuffle steps. Does better with walker but poor balance noted. Toileting- Clothing Manipulation and Hygiene: Total assistance;Cueing for safety;Sit to/from stand Toileting - Clothing Manipulation Details (indicate cue type and reason): Pt stood while second person cleaned bottom. Pt unable to let go of walker to clean self. Pt could do some in sitting but likes to stand to complete cleaning self.     Functional mobility during ADLs: Moderate assistance;Rolling walker (2 wheels) General ADL Comments: Pt mod assist to total assist for most adls. Noted pt has TLSO brace. Will follow up on location of this brace and if it is still necessary.     Vision Ability to See in Adequate Light: 1 Impaired Patient Visual  Report: No change from baseline Vision Assessment?: Vision impaired- to be further tested in functional context Additional Comments: Needs further evaluation     Perception         Praxis         Pertinent Vitals/Pain Pain Assessment Pain Assessment: No/denies pain     Extremity/Trunk Assessment Upper Extremity Assessment Upper Extremity Assessment: RUE deficits/detail;LUE deficits/detail RUE Deficits / Details: gross shoulder strength 3/5 all other strength grossly 4 to 4+/5; decreased shoulder ROM at baseline; mildly decreased elbow extension at baseline; decreased coordination RUE Sensation: WNL RUE Coordination: decreased gross motor;decreased fine motor LUE Deficits / Details: gross shoulder strength 3/5 all other strength grossly 3+ to 4/5; decreased shoulder ROM at baseline; mildly decreased elbow extension at baseline; decreased coordination LUE Sensation: WNL LUE Coordination: decreased fine motor;decreased gross motor   Lower Extremity Assessment Lower Extremity Assessment: Defer to PT evaluation   Cervical / Trunk Assessment Cervical / Trunk Assessment: Kyphotic;Other exceptions (back fractures from fall in Sept)   Communication Communication Communication: Impaired Factors Affecting Communication: Hearing impaired;Reduced clarity of speech;Difficulty expressing self   Cognition Arousal: Alert Behavior During Therapy: Impulsive Cognition: History of cognitive impairments, Cognition impaired, No family/caregiver present to determine baseline     Awareness: Intellectual awareness intact, Online awareness impaired Memory impairment (select all impairments): Short-term memory, Working memory, Engineer, structural memory Attention impairment (select first level of impairment): Selective attention Executive functioning impairment (select all impairments):  Reasoning, Problem solving OT - Cognition Comments: Pt has history of dementia but was oriented and answering  questions accurately today.                 Following commands: Impaired Following commands impaired: Only follows one step commands consistently, Follows multi-step commands inconsistently     Cueing  General Comments   Cueing Techniques: Verbal cues  Pt limited with most LE adls and all funtional mobility due to LE weakness, dementia and decreased balance.   Exercises     Shoulder Instructions      Home Living Family/patient expects to be discharged to:: Skilled nursing facility Living Arrangements: Alone Available Help at Discharge: Friend(s);Family;Available PRN/intermittently Type of Home: Apartment Home Access: Level entry     Home Layout: One level     Bathroom Shower/Tub: Chief Strategy Officer: Handicapped height     Home Equipment: Cane - single Information systems manager (2 wheels);Rollator (4 wheels);Wheelchair - Sport and exercise psychologist Comments: pt questionable historian, but consistent in his report today      Prior Functioning/Environment Prior Level of Function : Needs assist       Physical Assist : Mobility (physical);ADLs (physical) Mobility (physical): Bed mobility;Transfers;Gait;Stairs ADLs (physical): Feeding;Grooming;Bathing;Dressing;Toileting;IADLs Mobility Comments: Pt has been in SNF for last 2 weeks and states he uses a walker. Was previously independent with mobilty before fall in early Sept. ADLs Comments: Pt has been getting assist with all adls in SNF but prior to this was independent with basic adls. Pt gets assist for driving. Pt was managing own meds but was making several mistakes per his report.    OT Problem List: Decreased strength;Decreased activity tolerance;Impaired balance (sitting and/or standing);Decreased coordination;Decreased knowledge of use of DME or AE   OT Treatment/Interventions: Self-care/ADL training;Therapeutic exercise;Energy conservation;DME and/or AE instruction;Therapeutic  activities;Cognitive remediation/compensation;Patient/family education;Balance training      OT Goals(Current goals can be found in the care plan section)   Acute Rehab OT Goals Patient Stated Goal: none stated OT Goal Formulation: With patient Time For Goal Achievement: 12/30/23 Potential to Achieve Goals: Good ADL Goals Pt Will Perform Eating: with set-up;sitting Pt Will Perform Grooming: with contact guard assist;standing Pt Will Perform Lower Body Bathing: with supervision;sit to/from stand;with contact guard assist Pt Will Perform Lower Body Dressing: with contact guard assist;sit to/from stand Pt Will Transfer to Toilet: with contact guard assist;ambulating;grab bars Pt Will Perform Toileting - Clothing Manipulation and hygiene: with supervision;sit to/from stand   OT Frequency:  Min 2X/week    Co-evaluation              AM-PAC OT 6 Clicks Daily Activity     Outcome Measure Help from another person eating meals?: A Little Help from another person taking care of personal grooming?: A Little Help from another person toileting, which includes using toliet, bedpan, or urinal?: A Lot Help from another person bathing (including washing, rinsing, drying)?: A Lot Help from another person to put on and taking off regular upper body clothing?: A Little Help from another person to put on and taking off regular lower body clothing?: A Lot 6 Click Score: 15   End of Session Equipment Utilized During Treatment: Rolling walker (2 wheels) Nurse Communication: Mobility status;Other (comment) (need to follow up with  need for TLSO)  Activity Tolerance: Patient tolerated treatment well Patient left: in bed;with call bell/phone within reach;with bed alarm set  OT Visit Diagnosis: Unsteadiness on feet (R26.81);Other abnormalities of gait and mobility (R26.89);Repeated falls (  R29.6);Muscle weakness (generalized) (M62.81);History of falling (Z91.81);Other symptoms and signs involving  cognitive function                Time: 0936-1000 OT Time Calculation (min): 24 min Charges:  OT General Charges $OT Visit: 1 Visit OT Evaluation $OT Eval Moderate Complexity: 1 Mod OT Treatments $Self Care/Home Management : 8-22 mins  Joshua Silvano Dragon 12/16/2023, 10:24 AM

## 2023-12-16 NOTE — Consult Note (Signed)
 [facility] Consult  Referring Provider: [name] Primary Care Physician:  [name], MD Primary Gastroenterologist: [name] seen by [name] in 2016)  Reason for Consultation: Anemia, hypotension, FOBT positive stool  HPI: Peter Mann is a 83 y.o. male was transferred from [facility] for lethargy and hypotension after participating in PT, noted to have a blood pressure of 76/48 mmHg at the facility, improved to 116/60 mmHg after 500 cc of IV fluid.  It appears patient fell, had fatigue and was transferred to ER.  Patient denies noticing blood in stool or black stools. He denies nausea, vomiting, acid reflux, heartburn, difficulty swallowing, pain on swallowing. He seems to have constipation, home medications include Amitiza 24 mcg twice a day, Senokot 1 tablet at bedtime and MiraLAX  17 g as needed.  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  Previous GI workup: Colonoscopy, 2016, hematochezia, [name]: Sigmoid diverticulosis, internal and external hemorrhoids Colonoscopy, 2010, [name]: 1 tubular adenoma removed from transverse colon, sigmoid diverticulosis, internal hemorrhoids Pathology 2005: Tubular adenomas and hyperplastic polyps removed Pathology 2002: Tubular adenomas removed  Past Medical History:  Diagnosis Date   At low risk for fall    Cancer (HCC)    prostrate ca, radiation treatments   Chest pain    COPD (chronic obstructive pulmonary disease) (HCC)    Dementia (HCC)    Diabetes mellitus without complication (HCC)    Dizziness    History of tobacco abuse    Hyperlipidemia    Hypertension    Stroke [facility])    Subconjunctival hemorrhage of left eye 07/18/2019   The condition of the involved eye is that of a subconjunctival hemorrhage.  These are often spontaneous but are also  often at or near the site of low location of an injection should to be placed into the eye.  In the absence of direct blunt or severe trauma these will typically resolve on their own spontaneously and have no impact on the vision.    These are very similar to having a bruise in your    Past Surgical History:  Procedure Laterality Date   CHOLECYSTECTOMY N/A 01/30/2014   Procedure: LAPAROSCOPIC CHOLECYSTECTOMY;  Surgeon: [name], MD;  Location: [facility] OR;  Service: General;  Laterality: N/A;    Prior to Admission medications   Medication Sig Start Date End Date Taking? Authorizing Provider  ALPHAGAN  P 0.1 % SOLN Place 1 drop into both eyes 2 (two) times daily. 04/01/23  Yes [provider]  amLODipine  (NORVASC ) 10 MG tablet Take 10 mg by mouth daily.   Yes [provider]  aspirin  EC 81 MG tablet Take 1 tablet (81 mg total) by mouth daily. Swallow whole. 11/28/23  Yes Elgergawy, Brayton RAMAN, MD  bisacodyl  (DULCOLAX) 10 MG suppository Place 10 mg rectally daily as needed for moderate constipation.   Yes [provider]  carvedilol  (COREG ) 6.25 MG tablet Take 1 tablet (6.25 mg total) by mouth 2 (two) times daily with a meal. 11/27/23  Yes Elgergawy, Brayton RAMAN, MD  CEQUA  0.09 % SOLN Place 1 drop into both eyes 2 (two) times daily.   Yes [provider]  cyanocobalamin (VITAMIN B12) 1000 MCG tablet Take 1 tablet (1,000 mcg total) by mouth daily. 10/06/23  Yes Camara, Amadou, MD  dexlansoprazole (DEXILANT) 60 MG capsule Take 60 mg by mouth daily.   Yes [provider]  diclofenac sodium (VOLTAREN) 1 % GEL Apply 2 g topically as needed (pain).    Yes [provider]  docusate sodium  (COLACE) 100 MG capsule Take 2 capsules (200 mg total) by mouth 2 (two) times daily. 11/27/23  Yes Elgergawy, Brayton RAMAN, MD  ferrous sulfate  325 (65 FE) MG tablet Take 1 tablet (325 mg total) by mouth 2 (two) times daily with a meal. 11/27/23  Yes Elgergawy, Brayton RAMAN, MD  folic  acid (FOLVITE ) 1 MG tablet Take 1 tablet (1 mg total) by mouth daily. 11/28/23  Yes Elgergawy, Brayton RAMAN, MD  hydrALAZINE  (APRESOLINE ) 50 MG tablet Take 1 tablet (50 mg total) by mouth every 8 (eight) hours. 11/27/23  Yes Elgergawy, Brayton RAMAN, MD  insulin  aspart (NOVOLOG ) 100 UNIT/ML injection Inject 0-9 Units into the skin 3 (three) times daily with meals. insulin  aspart (novoLOG ) injection 0-9 Units  0-9 Units, Subcutaneous, 3 times daily with meals, First dose on Wed 11/18/23 at 0800 Correction coverage: Sensitive (thin, NPO, renal) CBG < 70: Implement Hypoglycemia Standing Orders and refer to Hypoglycemia Standing Orders sidebar report CBG 70 - 120: 0 units CBG 121 - 150: 1 unit CBG 151 - 200: 2 units CBG 201 - 250: 3 units CBG 251 - 300: 5 units CBG 301 - 350: 7 units CBG 351 - 400: 9 units CBG > 400: call MD 11/27/23  Yes Elgergawy, Brayton RAMAN, MD  isosorbide  mononitrate (IMDUR ) 30 MG 24 hr tablet Take 1 tablet (30 mg total) by mouth daily. 05/12/23  Yes Singh, Prashant K, MD  LINZESS  290 MCG CAPS capsule Take 290 mcg by mouth daily.   Yes [provider]  lubiprostone (AMITIZA) 24 MCG capsule Take 24 mcg by mouth 2 (two) times daily with a meal.   Yes [provider]  Magnesium  Hydroxide (MILK OF MAGNESIA PO) Take 30 mLs by mouth daily as needed (constipation).   Yes [provider]  polyethylene glycol (MIRALAX ) 17 g packet Take 17 g by mouth daily as needed for moderate constipation or mild constipation (if no bowel movement in 2 days). 05/15/22  Yes Lamptey, Aleene KIDD, MD  PRESCRIPTION MEDICATION Place 1 suppository rectally daily as needed (constipation). Enema Rectal - Sodium Phosphates    Yes [provider]  rosuvastatin  (CRESTOR ) 20 MG tablet Take 20 mg by mouth every evening.   Yes [provider]  senna-docusate (SENOKOT-S) 8.6-50 MG tablet Take 1 tablet by mouth at bedtime. 05/15/22  Yes Lamptey, Aleene KIDD, MD  tamsulosin  (FLOMAX ) 0.4 MG CAPS capsule Take 0.4  mg by mouth daily.    Yes [provider]  SANTYL 250 UNIT/GM ointment Apply 1 Application topically as directed. Patient not taking: Reported on 12/16/2023 12/08/23   [provider]    Current Facility-Administered Medications  Medication Dose Route Frequency Provider Last Rate Last Admin   0.9 %  sodium chloride  infusion (Manually program via Guardrails IV Fluids)   Intravenous Once Amponsah, Prosper M, MD       acetaminophen  (TYLENOL ) tablet 650 mg  650 mg Oral Q6H PRN Amponsah, Prosper M, MD   650 mg at 12/15/23 2221   Or   acetaminophen  (TYLENOL ) suppository 650 mg  650 mg Rectal Q6H PRN Lou Claretta HERO, MD       bisacodyl  (DULCOLAX) EC tablet 5 mg  5 mg Oral Daily PRN Amponsah, Prosper M, MD       collagenase (SANTYL) ointment   Topical Daily Lou Claretta HERO, MD  lactated ringers  infusion   Intravenous Continuous Lou Claretta HERO, MD 100 mL/hr at 12/16/23 0558 Infusion Verify at 12/16/23 0558   ondansetron  (ZOFRAN ) tablet 4 mg  4 mg Oral Q6H PRN Lou Claretta HERO, MD       Or   ondansetron  (ZOFRAN ) injection 4 mg  4 mg Intravenous Q6H PRN Amponsah, Prosper M, MD       Oral care mouth rinse  15 mL Mouth Rinse PRN Lou Claretta HERO, MD       pantoprazole  (PROTONIX ) injection 40 mg  40 mg Intravenous Q12H Lou Claretta HERO, MD   40 mg at 12/16/23 1010   rosuvastatin  (CRESTOR ) tablet 20 mg  20 mg Oral Daily Amponsah, Prosper M, MD   20 mg at 12/16/23 1010   senna-docusate (Senokot-S) tablet 1 tablet  1 tablet Oral QHS PRN Lou Claretta HERO, MD        Allergies as of 12/15/2023   (No Known Allergies)    Family History  Problem Relation Age of Onset   Cancer Brother    Cancer Sister     Social History   Socioeconomic History   Marital status: Legally Separated    Spouse name: Not on file   Number of children: 3   Years of education: 7   Highest education level: Not on file  Occupational History   Not on file  Tobacco Use   Smoking  status: Former    Current packs/day: 0.00    Types: Cigarettes    Quit date: 11/19/2018    Years since quitting: 5.0   Smokeless tobacco: Never   Tobacco comments:    smoke 3 to 4 cigarettes a day, 09/08/16 quit 3 wks ago  Vaping Use   Vaping status: Never Used  Substance and Sexual Activity   Alcohol  use: No    Alcohol /week: 0.0 standard drinks of alcohol     Comment: quit 25 years ago though before drank so much that you couldn't tell b/t alcohol  and wine   Drug use: No   Sexual activity: Never  Other Topics Concern   Not on file  Social History Narrative   Lives alone   Social Drivers of Health   Financial Resource Strain: Not on file  Food Insecurity: No Food Insecurity (12/16/2023)   Hunger Vital Sign    Worried About Running Out of Food in the Last Year: Never true    Ran Out of Food in the Last Year: Never true  Transportation Needs: No Transportation Needs (12/16/2023)   PRAPARE - Administrator, Civil Service (Medical): No    Lack of Transportation (Non-Medical): No  Physical Activity: Not on file  Stress: Not on file  Social Connections: Moderately Integrated (12/16/2023)   Social Connection and Isolation Panel    Frequency of Communication with Friends and Family: More than three times a week    Frequency of Social Gatherings with Friends and Family: Twice a week    Attends Religious Services: More than 4 times per year    Active Member of [employer] or Organizations: Yes    Attends Banker Meetings: More than 4 times per year    Marital Status: Widowed  Intimate Partner Violence: Not At Risk (12/16/2023)   Humiliation, Afraid, Rape, and Kick questionnaire    Fear of Current or Ex-Partner: No    Emotionally Abused: No    Physically Abused: No    Sexually Abused: No    Review of Systems: As per HPI  Physical Exam: Vital signs in last 24 hours: Temp:  [97.8 F (36.6 C)-98.6 F (37 C)] 98 F (36.7 C) (10/08 0519) Pulse Rate:  [69-93] 93  (10/08 0519) Resp:  [11-26] 16 (10/08 0519) BP: (103-154)/(54-70) 154/61 (10/08 0519) SpO2:  [80 %-100 %] 100 % (10/08 0519) Last BM Date : 12/15/23  General: Elderly, awake, pleasant  Head:  Normocephalic and atraumatic. Eyes:  Sclera clear, no icterus.   Prominent pallor Ears:  Normal auditory acuity. Nose:  No deformity, discharge,  or lesions. Mouth:  No deformity or lesions.  Oropharynx pink & moist. Neck:  Supple; no masses or thyromegaly. Lungs:  Clear throughout to auscultation.   No wheezes, crackles, or rhonchi. No acute distress. Heart:  Regular rate and rhythm; no murmurs, clicks, rubs,  or gallops. Extremities:  Without clubbing or edema. Neurologic:  Alert and  oriented x4;  grossly normal neurologically. Skin:  Intact without significant lesions or rashes. Psych:  Alert and cooperative. Normal mood and affect. Abdomen:  Soft, nontender and nondistended. No masses, hepatosplenomegaly or hernias noted. Normal bowel sounds, without guarding, and without rebound.         Lab Results: Recent Labs    12/15/23 1518 12/16/23 0040 12/16/23 0657  WBC 5.2 3.5*  --   HGB 7.4* 7.2* 8.6*  HCT 25.5* 23.8* 28.2*  PLT 265 266  --    BMET Recent Labs    12/15/23 1450 12/15/23 1518 12/16/23 0040  NA 144 142 142  K 4.3 4.5 4.1  CL 119* 117* 115*  CO2  --  14* 16*  GLUCOSE 154* 153* 105*  BUN 34* 42* 42*  CREATININE 2.40* 2.14* 1.89*  CALCIUM   --  9.2 9.5   LFT Recent Labs    12/15/23 1518  PROT 5.9*  ALBUMIN 3.0*  AST 17  ALT 15  ALKPHOS 53  BILITOT 0.2   PT/INR No results for input(s): LABPROT, INR in the last 72 hours.  Studies/Results: DG Hip Unilat W or Wo Pelvis 2-3 Views Right Result Date: 12/15/2023 CLINICAL DATA:  Wound. EXAM: DG HIP (WITH OR WITHOUT PELVIS) 2-3V RIGHT COMPARISON:  None Available. FINDINGS: There is no evidence of hip fracture or dislocation. Mild narrowing and osteophyte formation of right hip is noted. IMPRESSION: Mild  degenerative joint disease of right hip. No acute abnormality seen. Electronically Signed   By: Lynwood Landy Raddle M.D.   On: 12/15/2023 16:59    Impression: Anemia, hemoglobin 6.8 on presentation,FOBT positive stool Status post 1 unit PRBC transfusion, hemoglobin subsequently improved to 7.4/7.2/8.6 Low iron saturation of only 7% with a normal ferritin of 279, low TIBC, compatible with anemia of chronic disease and mild iron deficiency  Elevated BUN/creatinine ratio 42/1.89 with GFR 35 Mild malnutrition, total protein 5.9, albumin 3 Mild acidosis, bicarb 16  Chronic constipation-on Amitiza and Senokot  Comorbidities: History of prostate cancer treated with radiation, radiation cystitis, history of stroke, type 2 diabetes chronic kidney disease, C3-C4 spinal stenosis  Plan: No obvious melena or hematochezia, however since he presented with significant low hemoglobin and occult blood in stool, recommend proceeding with diagnostic endoscopy.  He is on clear liquid diet, will keep him n.p.o. postmidnight for procedure in a.m.SABRA Continue Protonix  40 mg twice a day.   Although patient has history of dementia, he is alert, awake, oriented x 3. I have also tried to reach out to his daughter, Madelin Gander, [phone] and left a message for her.  I will start patient back on Amitiza  24 mcg twice a day, he is continued on Senokot.     LOS: 1 day   Estelita Manas, MD  12/16/2023, 10:11 AM

## 2023-12-16 NOTE — Plan of Care (Signed)
  Problem: Clinical Measurements: Goal: Respiratory complications will improve Outcome: Progressing Goal: Cardiovascular complication will be avoided Outcome: Progressing   Problem: Nutrition: Goal: Adequate nutrition will be maintained Outcome: Progressing   Problem: Coping: Goal: Level of anxiety will decrease Outcome: Progressing   Problem: Pain Managment: Goal: General experience of comfort will improve and/or be controlled Outcome: Progressing   Problem: Skin Integrity: Goal: Risk for impaired skin integrity will decrease Outcome: Progressing

## 2023-12-16 NOTE — Progress Notes (Signed)
 Patient placed in air mattress overlay with 3 assist.

## 2023-12-16 NOTE — Consult Note (Addendum)
 WOC Nurse Consult Note: patient is known to [facility] team from previous admission with multiple pressure injuries  Reason for Consult: pressure injuries  Wound type: 1.  Stage 2 Pressure Injury coccyx pink  2.  Full thickness R shin 70% brown 30% red  3.  R trochanter (hip) Stage 3 Pressure Injury largely red with some subcutaneous tissue noted; previously unstageable (compare to  media 11/20/2023) 4.  R upper back Deep Tissue Pressure Injury healing purple maroon with scattered pink dry tissue noted  5.  Unstageable Pressure Injury L medial foot base of great toe  6.  Deep tissue pressure injury L trochanter purple maroon discoloration  Pressure Injury POA: Yes Measurement: see nursing flowsheet  Wound bed: as above  Drainage (amount, consistency, odor) see nursing flowsheet  Periwound: Dressing procedure/placement/frequency:  Cleanse R shin and L medial foot wounds with Vashe wound cleanser Soila [phone]) do not rinse and allow to air dry. Apply 1/4 thick layer of Santyl to wound bed daily, top with saline moist gauze, dry gauze and silicone foam.  Cleanse R hip wound with Vashe, using a Q tip applicator insert Vashe moistened gauze into wound bed daily making sure to cover any area of depth, cover with dry gauze then ABD pad and tape or silicone foam whichever works best.  Cover L hip and R upper back DTPI with silicone foam, lift daily to assess.  Change foam q3 days and prn soiling.  Cleanse coccyx wound with Vashe, apply Xeroform gauze ([place] [phone]) daily and secure with silicone foam.   Patient should be placed on a low air loss mattress for pressure redistribution and moisture management.   WOC team will not follow.  R hip may benefit from PT or surgical consult for possible removal of necrotic/subcutaneous tissue.    Thank you,    Powell Bar MSN, RN-BC, [employer]

## 2023-12-16 NOTE — H&P (View-Only) (Signed)
 [facility] Consult  Referring Provider: [name] Primary Care Physician:  [name], MD Primary Gastroenterologist: [name] seen by [name] in 2016)  Reason for Consultation: Anemia, hypotension, FOBT positive stool  HPI: Peter Mann is a 83 y.o. male was transferred from [facility] for lethargy and hypotension after participating in PT, noted to have a blood pressure of 76/48 mmHg at the facility, improved to 116/60 mmHg after 500 cc of IV fluid.  It appears patient fell, had fatigue and was transferred to ER.  Patient denies noticing blood in stool or black stools. He denies nausea, vomiting, acid reflux, heartburn, difficulty swallowing, pain on swallowing. He seems to have constipation, home medications include Amitiza 24 mcg twice a day, Senokot 1 tablet at bedtime and MiraLAX  17 g as needed.  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  Previous GI workup: Colonoscopy, 2016, hematochezia, [name]: Sigmoid diverticulosis, internal and external hemorrhoids Colonoscopy, 2010, [name]: 1 tubular adenoma removed from transverse colon, sigmoid diverticulosis, internal hemorrhoids Pathology 2005: Tubular adenomas and hyperplastic polyps removed Pathology 2002: Tubular adenomas removed  Past Medical History:  Diagnosis Date   At low risk for fall    Cancer (HCC)    prostrate ca, radiation treatments   Chest pain    COPD (chronic obstructive pulmonary disease) (HCC)    Dementia (HCC)    Diabetes mellitus without complication (HCC)    Dizziness    History of tobacco abuse    Hyperlipidemia    Hypertension    Stroke [facility])    Subconjunctival hemorrhage of left eye 07/18/2019   The condition of the involved eye is that of a subconjunctival hemorrhage.  These are often spontaneous but are also  often at or near the site of low location of an injection should to be placed into the eye.  In the absence of direct blunt or severe trauma these will typically resolve on their own spontaneously and have no impact on the vision.    These are very similar to having a bruise in your    Past Surgical History:  Procedure Laterality Date   CHOLECYSTECTOMY N/A 01/30/2014   Procedure: LAPAROSCOPIC CHOLECYSTECTOMY;  Surgeon: [name], MD;  Location: [facility] OR;  Service: General;  Laterality: N/A;    Prior to Admission medications   Medication Sig Start Date End Date Taking? Authorizing Provider  ALPHAGAN  P 0.1 % SOLN Place 1 drop into both eyes 2 (two) times daily. 04/01/23  Yes [provider]  amLODipine  (NORVASC ) 10 MG tablet Take 10 mg by mouth daily.   Yes [provider]  aspirin  EC 81 MG tablet Take 1 tablet (81 mg total) by mouth daily. Swallow whole. 11/28/23  Yes Elgergawy, Brayton RAMAN, MD  bisacodyl  (DULCOLAX) 10 MG suppository Place 10 mg rectally daily as needed for moderate constipation.   Yes [provider]  carvedilol  (COREG ) 6.25 MG tablet Take 1 tablet (6.25 mg total) by mouth 2 (two) times daily with a meal. 11/27/23  Yes Elgergawy, Brayton RAMAN, MD  CEQUA  0.09 % SOLN Place 1 drop into both eyes 2 (two) times daily.   Yes [provider]  cyanocobalamin (VITAMIN B12) 1000 MCG tablet Take 1 tablet (1,000 mcg total) by mouth daily. 10/06/23  Yes Camara, Amadou, MD  dexlansoprazole (DEXILANT) 60 MG capsule Take 60 mg by mouth daily.   Yes [provider]  diclofenac sodium (VOLTAREN) 1 % GEL Apply 2 g topically as needed (pain).    Yes [provider]  docusate sodium  (COLACE) 100 MG capsule Take 2 capsules (200 mg total) by mouth 2 (two) times daily. 11/27/23  Yes Elgergawy, Brayton RAMAN, MD  ferrous sulfate  325 (65 FE) MG tablet Take 1 tablet (325 mg total) by mouth 2 (two) times daily with a meal. 11/27/23  Yes Elgergawy, Brayton RAMAN, MD  folic  acid (FOLVITE ) 1 MG tablet Take 1 tablet (1 mg total) by mouth daily. 11/28/23  Yes Elgergawy, Brayton RAMAN, MD  hydrALAZINE  (APRESOLINE ) 50 MG tablet Take 1 tablet (50 mg total) by mouth every 8 (eight) hours. 11/27/23  Yes Elgergawy, Brayton RAMAN, MD  insulin  aspart (NOVOLOG ) 100 UNIT/ML injection Inject 0-9 Units into the skin 3 (three) times daily with meals. insulin  aspart (novoLOG ) injection 0-9 Units  0-9 Units, Subcutaneous, 3 times daily with meals, First dose on Wed 11/18/23 at 0800 Correction coverage: Sensitive (thin, NPO, renal) CBG < 70: Implement Hypoglycemia Standing Orders and refer to Hypoglycemia Standing Orders sidebar report CBG 70 - 120: 0 units CBG 121 - 150: 1 unit CBG 151 - 200: 2 units CBG 201 - 250: 3 units CBG 251 - 300: 5 units CBG 301 - 350: 7 units CBG 351 - 400: 9 units CBG > 400: call MD 11/27/23  Yes Elgergawy, Brayton RAMAN, MD  isosorbide  mononitrate (IMDUR ) 30 MG 24 hr tablet Take 1 tablet (30 mg total) by mouth daily. 05/12/23  Yes Singh, Prashant K, MD  LINZESS  290 MCG CAPS capsule Take 290 mcg by mouth daily.   Yes [provider]  lubiprostone (AMITIZA) 24 MCG capsule Take 24 mcg by mouth 2 (two) times daily with a meal.   Yes [provider]  Magnesium  Hydroxide (MILK OF MAGNESIA PO) Take 30 mLs by mouth daily as needed (constipation).   Yes [provider]  polyethylene glycol (MIRALAX ) 17 g packet Take 17 g by mouth daily as needed for moderate constipation or mild constipation (if no bowel movement in 2 days). 05/15/22  Yes Lamptey, Aleene KIDD, MD  PRESCRIPTION MEDICATION Place 1 suppository rectally daily as needed (constipation). Enema Rectal - Sodium Phosphates    Yes [provider]  rosuvastatin  (CRESTOR ) 20 MG tablet Take 20 mg by mouth every evening.   Yes [provider]  senna-docusate (SENOKOT-S) 8.6-50 MG tablet Take 1 tablet by mouth at bedtime. 05/15/22  Yes Lamptey, Aleene KIDD, MD  tamsulosin  (FLOMAX ) 0.4 MG CAPS capsule Take 0.4  mg by mouth daily.    Yes [provider]  SANTYL 250 UNIT/GM ointment Apply 1 Application topically as directed. Patient not taking: Reported on 12/16/2023 12/08/23   [provider]    Current Facility-Administered Medications  Medication Dose Route Frequency Provider Last Rate Last Admin   0.9 %  sodium chloride  infusion (Manually program via Guardrails IV Fluids)   Intravenous Once Amponsah, Prosper M, MD       acetaminophen  (TYLENOL ) tablet 650 mg  650 mg Oral Q6H PRN Amponsah, Prosper M, MD   650 mg at 12/15/23 2221   Or   acetaminophen  (TYLENOL ) suppository 650 mg  650 mg Rectal Q6H PRN Lou Claretta HERO, MD       bisacodyl  (DULCOLAX) EC tablet 5 mg  5 mg Oral Daily PRN Amponsah, Prosper M, MD       collagenase (SANTYL) ointment   Topical Daily Lou Claretta HERO, MD  lactated ringers  infusion   Intravenous Continuous Lou Claretta HERO, MD 100 mL/hr at 12/16/23 0558 Infusion Verify at 12/16/23 0558   ondansetron  (ZOFRAN ) tablet 4 mg  4 mg Oral Q6H PRN Lou Claretta HERO, MD       Or   ondansetron  (ZOFRAN ) injection 4 mg  4 mg Intravenous Q6H PRN Amponsah, Prosper M, MD       Oral care mouth rinse  15 mL Mouth Rinse PRN Lou Claretta HERO, MD       pantoprazole  (PROTONIX ) injection 40 mg  40 mg Intravenous Q12H Lou Claretta HERO, MD   40 mg at 12/16/23 1010   rosuvastatin  (CRESTOR ) tablet 20 mg  20 mg Oral Daily Amponsah, Prosper M, MD   20 mg at 12/16/23 1010   senna-docusate (Senokot-S) tablet 1 tablet  1 tablet Oral QHS PRN Lou Claretta HERO, MD        Allergies as of 12/15/2023   (No Known Allergies)    Family History  Problem Relation Age of Onset   Cancer Brother    Cancer Sister     Social History   Socioeconomic History   Marital status: Legally Separated    Spouse name: Not on file   Number of children: 3   Years of education: 7   Highest education level: Not on file  Occupational History   Not on file  Tobacco Use   Smoking  status: Former    Current packs/day: 0.00    Types: Cigarettes    Quit date: 11/19/2018    Years since quitting: 5.0   Smokeless tobacco: Never   Tobacco comments:    smoke 3 to 4 cigarettes a day, 09/08/16 quit 3 wks ago  Vaping Use   Vaping status: Never Used  Substance and Sexual Activity   Alcohol  use: No    Alcohol /week: 0.0 standard drinks of alcohol     Comment: quit 25 years ago though before drank so much that you couldn't tell b/t alcohol  and wine   Drug use: No   Sexual activity: Never  Other Topics Concern   Not on file  Social History Narrative   Lives alone   Social Drivers of Health   Financial Resource Strain: Not on file  Food Insecurity: No Food Insecurity (12/16/2023)   Hunger Vital Sign    Worried About Running Out of Food in the Last Year: Never true    Ran Out of Food in the Last Year: Never true  Transportation Needs: No Transportation Needs (12/16/2023)   PRAPARE - Administrator, Civil Service (Medical): No    Lack of Transportation (Non-Medical): No  Physical Activity: Not on file  Stress: Not on file  Social Connections: Moderately Integrated (12/16/2023)   Social Connection and Isolation Panel    Frequency of Communication with Friends and Family: More than three times a week    Frequency of Social Gatherings with Friends and Family: Twice a week    Attends Religious Services: More than 4 times per year    Active Member of [employer] or Organizations: Yes    Attends Banker Meetings: More than 4 times per year    Marital Status: Widowed  Intimate Partner Violence: Not At Risk (12/16/2023)   Humiliation, Afraid, Rape, and Kick questionnaire    Fear of Current or Ex-Partner: No    Emotionally Abused: No    Physically Abused: No    Sexually Abused: No    Review of Systems: As per HPI  Physical Exam: Vital signs in last 24 hours: Temp:  [97.8 F (36.6 C)-98.6 F (37 C)] 98 F (36.7 C) (10/08 0519) Pulse Rate:  [69-93] 93  (10/08 0519) Resp:  [11-26] 16 (10/08 0519) BP: (103-154)/(54-70) 154/61 (10/08 0519) SpO2:  [80 %-100 %] 100 % (10/08 0519) Last BM Date : 12/15/23  General: Elderly, awake, pleasant  Head:  Normocephalic and atraumatic. Eyes:  Sclera clear, no icterus.   Prominent pallor Ears:  Normal auditory acuity. Nose:  No deformity, discharge,  or lesions. Mouth:  No deformity or lesions.  Oropharynx pink & moist. Neck:  Supple; no masses or thyromegaly. Lungs:  Clear throughout to auscultation.   No wheezes, crackles, or rhonchi. No acute distress. Heart:  Regular rate and rhythm; no murmurs, clicks, rubs,  or gallops. Extremities:  Without clubbing or edema. Neurologic:  Alert and  oriented x4;  grossly normal neurologically. Skin:  Intact without significant lesions or rashes. Psych:  Alert and cooperative. Normal mood and affect. Abdomen:  Soft, nontender and nondistended. No masses, hepatosplenomegaly or hernias noted. Normal bowel sounds, without guarding, and without rebound.         Lab Results: Recent Labs    12/15/23 1518 12/16/23 0040 12/16/23 0657  WBC 5.2 3.5*  --   HGB 7.4* 7.2* 8.6*  HCT 25.5* 23.8* 28.2*  PLT 265 266  --    BMET Recent Labs    12/15/23 1450 12/15/23 1518 12/16/23 0040  NA 144 142 142  K 4.3 4.5 4.1  CL 119* 117* 115*  CO2  --  14* 16*  GLUCOSE 154* 153* 105*  BUN 34* 42* 42*  CREATININE 2.40* 2.14* 1.89*  CALCIUM   --  9.2 9.5   LFT Recent Labs    12/15/23 1518  PROT 5.9*  ALBUMIN 3.0*  AST 17  ALT 15  ALKPHOS 53  BILITOT 0.2   PT/INR No results for input(s): LABPROT, INR in the last 72 hours.  Studies/Results: DG Hip Unilat W or Wo Pelvis 2-3 Views Right Result Date: 12/15/2023 CLINICAL DATA:  Wound. EXAM: DG HIP (WITH OR WITHOUT PELVIS) 2-3V RIGHT COMPARISON:  None Available. FINDINGS: There is no evidence of hip fracture or dislocation. Mild narrowing and osteophyte formation of right hip is noted. IMPRESSION: Mild  degenerative joint disease of right hip. No acute abnormality seen. Electronically Signed   By: Lynwood Landy Raddle M.D.   On: 12/15/2023 16:59    Impression: Anemia, hemoglobin 6.8 on presentation,FOBT positive stool Status post 1 unit PRBC transfusion, hemoglobin subsequently improved to 7.4/7.2/8.6 Low iron saturation of only 7% with a normal ferritin of 279, low TIBC, compatible with anemia of chronic disease and mild iron deficiency  Elevated BUN/creatinine ratio 42/1.89 with GFR 35 Mild malnutrition, total protein 5.9, albumin 3 Mild acidosis, bicarb 16  Chronic constipation-on Amitiza and Senokot  Comorbidities: History of prostate cancer treated with radiation, radiation cystitis, history of stroke, type 2 diabetes chronic kidney disease, C3-C4 spinal stenosis  Plan: No obvious melena or hematochezia, however since he presented with significant low hemoglobin and occult blood in stool, recommend proceeding with diagnostic endoscopy.  He is on clear liquid diet, will keep him n.p.o. postmidnight for procedure in a.m.SABRA Continue Protonix  40 mg twice a day.   Although patient has history of dementia, he is alert, awake, oriented x 3. I have also tried to reach out to his daughter, Madelin Gander, [phone] and left a message for her.  I will start patient back on Amitiza  24 mcg twice a day, he is continued on Senokot.     LOS: 1 day   Estelita Manas, MD  12/16/2023, 10:11 AM

## 2023-12-16 NOTE — Evaluation (Signed)
 Physical Therapy Evaluation Patient Details Name: Peter Mann MRN: [id number] DOB: December 25, 1940 Today's Date: 12/16/2023  History of Present Illness  Pt is an 83 yo male admitted from [facility] with symptomatc anemia, lethargy and hypotension on 12/16/23. Hgb was 7.4 on admission and after transfusion is at 8.6. PMH: admit in early Sept for fall at home with resulting rhabdo and non specified back fxs, many pressure wounds, CVA, CA, DM, COPD, CKD III, dementia  Clinical Impression  Pt admitted with above diagnosis. At baseline, pt lives alone and was ambulatory and independent.  Since fall in September he has been at [facility] and was working with therapy on walking.  Today, pt does not have back brace (dtr bringing) but reports he gets up to bathroom with assist without brace at SNF.  He required min-mod A transfers and ambulated short distances in room until brace arrives.  Pt currently with functional limitations due to the deficits listed below (see PT Problem List). Pt will benefit from acute skilled PT to increase their independence and safety with mobility to allow discharge.  Recommend patient will continue to benefit from continued inpatient follow up therapy, <3 hours/day at d/c         If plan is discharge home, recommend the following: A lot of help with bathing/dressing/bathroom;Assistance with cooking/housework;A lot of help with walking and/or transfers   Can travel by private vehicle   Yes    Equipment Recommendations Rolling walker (2 wheels);Wheelchair (measurements PT);Wheelchair cushion (measurements PT)  Recommendations for Other Services       Functional Status Assessment Patient has had a recent decline in their functional status and demonstrates the ability to make significant improvements in function in a reasonable and predictable amount of time.     Precautions / Restrictions Precautions Precautions: Fall;Back Recall of Precautions/Restrictions:  Impaired Precaution/Restrictions Comments: Cues for back prec, unable to recall Spinal Brace: Thoracolumbosacral orthotic Restrictions Other Position/Activity Restrictions: Per last admission TLSO in sitting position.  Pt reports getting up to bathroom at [facility] without brace, uses brace during therapy sessions      Mobility  Bed Mobility Overal bed mobility: Needs Assistance Bed Mobility: Rolling, Sidelying to Sit, Sit to Sidelying Rolling: Min assist, Used rails Sidelying to sit: Mod assist, Used rails     Sit to sidelying: Mod assist, Used rails General bed mobility comments: Cues for log roll    Transfers Overall transfer level: Needs assistance Equipment used: Rolling walker (2 wheels) Transfers: Sit to/from Stand Sit to Stand: Mod assist, From elevated surface           General transfer comment: Light mod A to stand, cues for hand placement    Ambulation/Gait Ambulation/Gait assistance: Min assist Gait Distance (Feet): 30 Feet Assistive device: Rolling walker (2 wheels) Gait Pattern/deviations: Step-to pattern, Trunk flexed, Decreased stride length Gait velocity: reduced     General Gait Details: Cues for posture as able, short distance ambulation in room as pt reports wears brace for longer distances  Stairs            Wheelchair Mobility     Tilt Bed    Modified Rankin (Stroke Patients Only)       Balance Overall balance assessment: Needs assistance Sitting-balance support: Feet supported Sitting balance-Leahy Scale: Fair Sitting balance - Comments: Sat EOB wtih supervision   Standing balance support: Bilateral upper extremity supported, During functional activity, Reliant on assistive device for balance Standing balance-Leahy Scale: Poor Standing balance comment: RW and min A  Pertinent Vitals/Pain Pain Assessment Pain Assessment: No/denies pain    Home Living Family/patient expects to be  discharged to:: Skilled nursing facility Living Arrangements: Alone Available Help at Discharge: Friend(s);Family;Available PRN/intermittently Type of Home: Apartment Home Access: Level entry       Home Layout: One level Home Equipment: Cane - single Information systems manager (2 wheels);Rollator (4 wheels);Wheelchair - manual;Shower seat Additional Comments: Pt answering questions appropriately    Prior Function Prior Level of Function : Needs assist             Mobility Comments: Pt has been in SNF for last 2 weeks and states he uses a walker. Was previously independent with mobilty before fall in early Sept. ADLs Comments: Pt has been getting assist with all adls in SNF but prior to this was independent with basic adls. Pt gets assist for driving. Pt was managing own meds but was making several mistakes per his report.     Extremity/Trunk Assessment   Upper Extremity Assessment Upper Extremity Assessment: Defer to OT evaluation    Lower Extremity Assessment Lower Extremity Assessment: LLE deficits/detail;RLE deficits/detail RLE Deficits / Details: ROM WFL; MMT: ankle 5/5, knee ext 5/5, hip grossly 4/5 LLE Deficits / Details: ROM WFL; MMT: ankle 5/5, knee ext 5/5, hip grossly 4/5    Cervical / Trunk Assessment Cervical / Trunk Assessment: Kyphotic;Other exceptions Cervical / Trunk Exceptions: Per CT in Sept.  Lumbar spine : negative.  Thoracic: 1 No acute compression fracture.  2. Findings concerning for fractured osteophytes at T9-10 with lucency and gas  extending into the anterior aspect of the T9-10 disc.  Communication   Communication Factors Affecting Communication: Hearing impaired;Reduced clarity of speech;Difficulty expressing self    Cognition Arousal: Alert Behavior During Therapy: WFL for tasks assessed/performed   PT - Cognitive impairments: Sequencing                       PT - Cognition Comments: Cues for transfer techniques with back  precautions   Following commands impaired: Only follows one step commands consistently, Follows multi-step commands inconsistently     Cueing       General Comments General comments (skin integrity, edema, etc.): OT spoke with family who is bringing TLSO from SNF.  Pt reports was getting up in room at SNF for toileting without brace, wearing it to ambulate further and work with therapy per his report.    Exercises     Assessment/Plan    PT Assessment Patient needs continued PT services  PT Problem List Decreased strength;Decreased range of motion;Decreased activity tolerance;Decreased balance;Decreased mobility;Decreased cognition;Decreased safety awareness;Decreased knowledge of precautions;Pain;Decreased knowledge of use of DME       PT Treatment Interventions DME instruction;Gait training;Functional mobility training;Therapeutic activities;Therapeutic exercise;Balance training;Neuromuscular re-education;Cognitive remediation;Patient/family education    PT Goals (Current goals can be found in the Care Plan section)  Acute Rehab PT Goals Patient Stated Goal: return home PT Goal Formulation: With patient Time For Goal Achievement: 12/30/23 Potential to Achieve Goals: Good    Frequency Min 1X/week     Co-evaluation               AM-PAC PT 6 Clicks Mobility  Outcome Measure Help needed turning from your back to your side while in a flat bed without using bedrails?: A Little Help needed moving from lying on your back to sitting on the side of a flat bed without using bedrails?: A Lot Help needed moving to and from a bed  to a chair (including a wheelchair)?: A Lot Help needed standing up from a chair using your arms (e.g., wheelchair or bedside chair)?: A Lot Help needed to walk in hospital room?: A Lot Help needed climbing 3-5 steps with a railing? : Total 6 Click Score: 12    End of Session Equipment Utilized During Treatment: Gait belt Activity Tolerance: Patient  tolerated treatment well Patient left: with call bell/phone within reach;in bed (4 rails up, air mattress no alarm on bed) Nurse Communication: Mobility status;Precautions PT Visit Diagnosis: Unsteadiness on feet (R26.81);Other abnormalities of gait and mobility (R26.89);Repeated falls (R29.6);Muscle weakness (generalized) (M62.81);History of falling (Z91.81);Pain    Time: 8480-8462 PT Time Calculation (min) (ACUTE ONLY): 18 min   Charges:   PT Evaluation $PT Eval Low Complexity: 1 Low   PT General Charges $$ ACUTE PT VISIT: 1 Visit         Peter, PT Acute Rehab [address] [facility] [phone]   Peter Mann 12/16/2023, 3:52 PM

## 2023-12-16 NOTE — Progress Notes (Signed)
 [name]                                                                              [place], is a 83 y.o. male, DOB - 1940/10/25, FMW:[id number] Admit date - 12/15/2023    Outpatient Primary MD for the patient is [name], MD  LOS - 1  days  Chief Complaint  Patient presents with   Fatigue       Brief summary   Patient is a 83 year old male with dementia, CVA with left-sided weakness, DM type 2 , COPD, hypertension, chronic anemia, CKD stage 3b, history of prostate cancer presented from [facility] for evaluation of lethargy and hypotension.  On chart review, EMS was called to patient's facility after he suddenly became lethargic after participating in PT. He was found to have a BP of 76/48 at the facility. EMS administered 500 cc of IVF with improvement in BP to 116/60. During evaluation, patient reported that he slipped and fell on a stool a day before the admission.  He endorsed fatigue and mild abdominal discomfort but no nausea, vomiting reported fever, chills, chest pain or shortness of breath. He is unsure if he has had any bloody or black stools.   On chart review, patient had a recent hospitalization at [facility] from 9/9 to 9/19 for AKI in the setting of nontraumatic rhabdomyolysis.  He was discharged to [facility] for rehab.   ED Course: Initial vitals show temp 98.6, RR 20, HR 70, BP 103/55. Initial labs significant for Hgb 7.4, BUNs/creatinine 42/2.14, bicarb 14, glucose 153, CK 38 and positive fecal occult test. 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   Assessment & Plan      Symptomatic anemia  Acute on chronic anemia, iron deficiency anemia -Hemoglobin 7.4 on admission, baseline 9-10 - Pt presented with acute onset of lethargy and hypotension, patient unsure if he has had any melena or hematochezia -Transfused 1 unit packed RBCs, hemoglobin 8.6. - Anemia panel showed iron 15,  TIBC 210, percent saturation ratio 7, ferritin 279 -IV Feraheme x 1 --GI consulted, endoscopy in a.m., n.p.o. after midnight - Continue IV PPI   Hypotension, resolved - Patient presented due to hypotension at facility with SBP in the 70s, improved after 500 cc bolus by EMS - BP now improving - Resume Coreg  6.25 mg twice daily, continue to hold Imdur  and Norvasc      Mild AKI on CKD stage IIIb  - Creatinine of 2.14 around baseline of 1.8-2.2 - Renal function improving, creatinine 2.4 on admission      History of CVA  HLD - Continue rosuvastatin       Dementia - Patient is alert and oriented, continue delirium precautions    Hx of prostate cancer - Status post radiation   Generalized weakness - In the setting of acute blood loss and hypotension - PT evaluation  Constipation - Continue bowel regimen  Chronic wounds, pressure ulcers, POA - Wound care consulted  Pressure Injury Documentation: Wound 12/16/23 0530 Pressure Injury Foot Left;Medial Unstageable - Full thickness tissue loss in which the base of  the injury is covered by slough (yellow, tan, gray, green or brown) and/or eschar (tan, brown or black) in the wound bed. (Active)     Wound 12/16/23 Pressure Injury Back Right;Upper Deep Tissue Pressure Injury - Purple or maroon localized area of discolored intact skin or blood-filled blister due to damage of underlying soft tissue from pressure and/or shear. (Active)     Wound 12/16/23 0536 Pressure Injury Hip Left Deep Tissue Pressure Injury - Purple or maroon localized area of discolored intact skin or blood-filled blister due to damage of underlying soft tissue from pressure and/or shear. (Active)   Estimated body mass index is 24.52 kg/m as calculated from the following:   Height as of this encounter: 5' 7 (1.702 m).   Weight as of 11/17/23: 71 kg.  Code Status:  DVT Prophylaxis:  SCDs Start: 12/15/23 1927   Level of Care: Level of care: Telemetry Family  Communication: Updated patient Disposition Plan:      Remains inpatient appropriate: EGD tomorrow   Procedures:    Consultants:   GI  Antimicrobials:   Anti-infectives (From admission, onward)    None          Medications  sodium chloride    Intravenous Once   collagenase   Topical Daily   lubiprostone  24 mcg Oral BID WC   pantoprazole  (PROTONIX ) IV  40 mg Intravenous Q12H   rosuvastatin   20 mg Oral Daily      Subjective:   Peter Mann was seen and examined today.  No acute complaints, feeling better.  BP now improving.  Patient denies dizziness, chest pain, shortness of breath, abdominal pain, N/V/D/C No acute events overnight.    Objective:   Vitals:   12/16/23 0204 12/16/23 0230 12/16/23 0257 12/16/23 0519  BP: (!) 129/58 (!) 144/56  (!) 154/61  Pulse: 74 73  93  Resp: 20 11  16   Temp: 97.9 F (36.6 C) 97.8 F (36.6 C)  98 F (36.7 C)  TempSrc:  Axillary  Oral  SpO2: 100% 100%  100%  Height:   5' 7 (1.702 m)     Intake/Output Summary (Last 24 hours) at 12/16/2023 1319 Last data filed at 12/16/2023 0558 Gross per 24 hour  Intake 1390.17 ml  Output 150 ml  Net 1240.17 ml     Wt Readings from Last 3 Encounters:  11/17/23 71 kg  11/07/23 72.1 kg  10/05/23 73.6 kg     Exam General: Alert and oriented x 3, NAD Cardiovascular: S1 S2 auscultated,  RRR Respiratory: Clear to auscultation bilaterally, no wheezing Gastrointestinal: Soft, nontender, nondistended, + bowel sounds Ext: no pedal edema bilaterally Neuro: No new deficits Psych: Normal affect     Data Reviewed:  I have personally reviewed following labs    CBC Lab Results  Component Value Date   WBC 3.5 (L) 12/16/2023   RBC 2.77 (L) 12/16/2023   HGB 8.6 (L) 12/16/2023   HCT 28.2 (L) 12/16/2023   MCV 85.9 12/16/2023   MCH 26.0 12/16/2023   PLT 266 12/16/2023   MCHC 30.3 12/16/2023   RDW 15.8 (H) 12/16/2023   LYMPHSABS 0.6 (L) 11/27/2023   MONOABS 0.7 11/27/2023   EOSABS  0.2 11/27/2023   BASOSABS 0.0 11/27/2023     Last metabolic panel Lab Results  Component Value Date   NA 142 12/16/2023   K 4.1 12/16/2023   CL 115 (H) 12/16/2023   CO2 16 (L) 12/16/2023   BUN 42 (H) 12/16/2023   CREATININE 1.89 (H)  12/16/2023   GLUCOSE 105 (H) 12/16/2023   GFRNONAA 35 (L) 12/16/2023   GFRAA 58 (L) 04/16/2018   CALCIUM  9.5 12/16/2023   PHOS 4.3 11/27/2023   PROT 5.9 (L) 12/15/2023   ALBUMIN 3.0 (L) 12/15/2023   BILITOT 0.2 12/15/2023   ALKPHOS 53 12/15/2023   AST 17 12/15/2023   ALT 15 12/15/2023   ANIONGAP 11 12/16/2023    CBG (last 3)  Recent Labs    12/15/23 1458  GLUCAP 166*      Coagulation Profile: No results for input(s): INR, PROTIME in the last 168 hours.   Radiology Studies: I have personally reviewed the imaging studies  DG Hip Unilat W or Wo Pelvis 2-3 Views Right Result Date: 12/15/2023 CLINICAL DATA:  Wound. EXAM: DG HIP (WITH OR WITHOUT PELVIS) 2-3V RIGHT COMPARISON:  None Available. FINDINGS: There is no evidence of hip fracture or dislocation. Mild narrowing and osteophyte formation of right hip is noted. IMPRESSION: Mild degenerative joint disease of right hip. No acute abnormality seen. Electronically Signed   By: Lynwood Landy Raddle M.D.   On: 12/15/2023 16:59       Devi Hopman M.D. [name] 12/16/2023, 1:19 PM  Available via Epic secure chat 7am-7pm After 7 pm, please refer to night coverage provider listed on amion.

## 2023-12-17 ENCOUNTER — Telehealth (HOSPITAL_COMMUNITY): Payer: Self-pay | Admitting: Pharmacy Technician

## 2023-12-17 ENCOUNTER — Telehealth (HOSPITAL_COMMUNITY): Payer: Self-pay | Admitting: Internal Medicine

## 2023-12-17 ENCOUNTER — Encounter (HOSPITAL_COMMUNITY)
Admission: EM | Disposition: A | Discharge status: Skilled Nursing Facility | Payer: Self-pay | Source: Skilled Nursing Facility | Attending: Internal Medicine

## 2023-12-17 ENCOUNTER — Inpatient Hospital Stay (HOSPITAL_COMMUNITY)

## 2023-12-17 ENCOUNTER — Encounter (HOSPITAL_COMMUNITY): Payer: Self-pay | Admitting: Student

## 2023-12-17 DIAGNOSIS — E860 Dehydration: Secondary | ICD-10-CM | POA: Diagnosis not present

## 2023-12-17 DIAGNOSIS — D509 Iron deficiency anemia, unspecified: Secondary | ICD-10-CM | POA: Insufficient documentation

## 2023-12-17 DIAGNOSIS — D649 Anemia, unspecified: Secondary | ICD-10-CM | POA: Diagnosis not present

## 2023-12-17 DIAGNOSIS — D500 Iron deficiency anemia secondary to blood loss (chronic): Secondary | ICD-10-CM | POA: Diagnosis not present

## 2023-12-17 DIAGNOSIS — K922 Gastrointestinal hemorrhage, unspecified: Secondary | ICD-10-CM | POA: Diagnosis not present

## 2023-12-17 DIAGNOSIS — Z87891 Personal history of nicotine dependence: Secondary | ICD-10-CM

## 2023-12-17 DIAGNOSIS — I129 Hypertensive chronic kidney disease with stage 1 through stage 4 chronic kidney disease, or unspecified chronic kidney disease: Secondary | ICD-10-CM

## 2023-12-17 DIAGNOSIS — K3189 Other diseases of stomach and duodenum: Secondary | ICD-10-CM

## 2023-12-17 DIAGNOSIS — N1832 Chronic kidney disease, stage 3b: Secondary | ICD-10-CM

## 2023-12-17 HISTORY — PX: BIOPSY OF SKIN SUBCUTANEOUS TISSUE AND/OR MUCOUS MEMBRANE: SHX6741

## 2023-12-17 HISTORY — PX: ESOPHAGOGASTRODUODENOSCOPY: SHX5428

## 2023-12-17 LAB — CBC
HCT: 29.8 % — ABNORMAL LOW (ref 39.0–52.0)
Hemoglobin: 9.1 g/dL — ABNORMAL LOW (ref 13.0–17.0)
MCH: 25.6 pg — ABNORMAL LOW (ref 26.0–34.0)
MCHC: 30.5 g/dL (ref 30.0–36.0)
MCV: 83.9 fL (ref 80.0–100.0)
Platelets: 231 K/uL (ref 150–400)
RBC: 3.55 MIL/uL — ABNORMAL LOW (ref 4.22–5.81)
RDW: 15.2 % (ref 11.5–15.5)
WBC: 3.9 K/uL — ABNORMAL LOW (ref 4.0–10.5)
nRBC: 0 % (ref 0.0–0.2)

## 2023-12-17 LAB — RENAL FUNCTION PANEL
Albumin: 3.1 g/dL — ABNORMAL LOW (ref 3.5–5.0)
Anion gap: 10 (ref 5–15)
BUN: 29 mg/dL — ABNORMAL HIGH (ref 8–23)
CO2: 19 mmol/L — ABNORMAL LOW (ref 22–32)
Calcium: 9.4 mg/dL (ref 8.9–10.3)
Chloride: 113 mmol/L — ABNORMAL HIGH (ref 98–111)
Creatinine, Ser: 1.68 mg/dL — ABNORMAL HIGH (ref 0.61–1.24)
GFR, Estimated: 40 mL/min — ABNORMAL LOW (ref >60)
Glucose, Bld: 90 mg/dL (ref 70–99)
Phosphorus: 3.8 mg/dL (ref 2.5–4.6)
Potassium: 3.9 mmol/L (ref 3.5–5.1)
Sodium: 142 mmol/L (ref 135–145)

## 2023-12-17 LAB — GLUCOSE, CAPILLARY: Glucose-Capillary: 96 mg/dL (ref 70–99)

## 2023-12-17 SURGERY — EGD (ESOPHAGOGASTRODUODENOSCOPY)
Anesthesia: Monitor Anesthesia Care

## 2023-12-17 MED ORDER — PANTOPRAZOLE SODIUM 40 MG IV SOLR
40.0000 mg | INTRAVENOUS | Status: DC
  Administered 2023-12-18: 40 mg via INTRAVENOUS
  Filled 2023-12-17: qty 10

## 2023-12-17 MED ORDER — SODIUM CHLORIDE 0.9 % IV SOLN: INTRAVENOUS | Status: DC

## 2023-12-17 MED ORDER — PROPOFOL 500 MG/50ML IV EMUL
INTRAVENOUS | Status: DC | PRN
  Administered 2023-12-17: 50 mg via INTRAVENOUS
  Administered 2023-12-17: 40 mg via INTRAVENOUS
  Administered 2023-12-17: 70 mg via INTRAVENOUS

## 2023-12-17 MED ORDER — LIDOCAINE 2% (20 MG/ML) 5 ML SYRINGE
INTRAMUSCULAR | Status: DC | PRN
  Administered 2023-12-17: 50 mg via INTRAVENOUS

## 2023-12-17 MED ORDER — PROPOFOL 10 MG/ML IV BOLUS
INTRAVENOUS | Status: AC
  Filled 2023-12-17: qty 20

## 2023-12-17 MED ORDER — ISOSORBIDE MONONITRATE ER 30 MG PO TB24
30.0000 mg | ORAL_TABLET | Freq: Every day | ORAL | Status: DC
Start: 2023-12-17 — End: 2023-12-18
  Administered 2023-12-17 – 2023-12-18 (×2): 30 mg via ORAL
  Filled 2023-12-17 (×2): qty 1

## 2023-12-17 MED ORDER — AMLODIPINE BESYLATE 10 MG PO TABS
10.0000 mg | ORAL_TABLET | Freq: Every day | ORAL | Status: DC
  Administered 2023-12-17 – 2023-12-18 (×2): 10 mg via ORAL
  Filled 2023-12-17 (×2): qty 1

## 2023-12-17 NOTE — Plan of Care (Signed)
  Problem: Elimination: Goal: Will not experience complications related to urinary retention 12/17/2023 0402 by Eriyana Sweeten, [facility] LABOR, RN Outcome: Not Progressing   Problem: Pain Managment: Goal: General experience of comfort will improve and/or be controlled 12/17/2023 0402 by Donney Caraveo, [facility] LABOR, RN Outcome: Not Progressing   Problem: Safety: Goal: Ability to remain free from injury will improve 12/17/2023 0402 by Ioana Louks, [facility] LABOR, RN Outcome: Not Progressing   Problem: Skin Integrity: Goal: Risk for impaired skin integrity will decrease 12/17/2023 0402 by Karema Tocci, [facility] LABOR, RN Outcome: Not Progressing 12/17/2023 0402 by Halsey Persaud, [facility] LABOR, RN Outcome: Progressing   Problem: Education: Goal: Knowledge of General Education information will improve Description: Including pain rating scale, medication(s)/side effects and non-pharmacologic comfort measures 12/17/2023 0402 by Lauria Depoy, [facility] LABOR, RN Outcome: Progressing 12/17/2023 0402 by Terrill [facility] LABOR, RN Outcome: Progressing   Problem: Health Behavior/Discharge Planning: Goal: Ability to manage health-related needs will improve 12/17/2023 0402 by Maicy Filip, [facility] LABOR, RN Outcome: Progressing 12/17/2023 0402 by Terrill [facility] LABOR, RN Outcome: Progressing   Problem: Clinical Measurements: Goal: Ability to maintain clinical measurements within normal limits will improve 12/17/2023 0402 by Kari Kerth, [facility] LABOR, RN Outcome: Progressing 12/17/2023 0402 by Terrill [facility] LABOR, RN Outcome: Progressing Goal: Will remain free from infection Outcome: Progressing Goal: Diagnostic test results will improve 12/17/2023 0402 by Emmanuel Gruenhagen, [facility] LABOR, RN Outcome: Progressing 12/17/2023 0402 by Terrill [facility] LABOR, RN Outcome: Progressing Goal: Respiratory complications will improve 12/17/2023 0402 by Stefana Lodico, [facility] LABOR, RN Outcome: Progressing 12/17/2023 0402 by Terrill [facility] LABOR, RN Outcome: Progressing Goal: Cardiovascular complication will be  avoided 12/17/2023 0402 by Muhannad Bignell, [facility] LABOR, RN Outcome: Progressing 12/17/2023 0402 by Terrill [facility] LABOR, RN Outcome: Progressing   Problem: Activity: Goal: Risk for activity intolerance will decrease 12/17/2023 0402 by Daelin Haste, [facility] LABOR, RN Outcome: Progressing 12/17/2023 0402 by Terrill [facility] LABOR, RN Outcome: Progressing   Problem: Nutrition: Goal: Adequate nutrition will be maintained 12/17/2023 0402 by Caisen Mangas, [facility] LABOR, RN Outcome: Progressing 12/17/2023 0402 by Terrill [facility] LABOR, RN Outcome: Progressing   Problem: Coping: Goal: Level of anxiety will decrease 12/17/2023 0402 by Hjalmer Iovino, [facility] LABOR, RN Outcome: Progressing 12/17/2023 0402 by Terrill [facility] LABOR, RN Outcome: Progressing   Problem: Elimination: Goal: Will not experience complications related to bowel motility 12/17/2023 0402 by Kayden Hutmacher, [facility] LABOR, RN Outcome: Progressing 12/17/2023 0402 by Terrill [facility] LABOR, RN Outcome: Progressing

## 2023-12-17 NOTE — Interval H&P Note (Signed)
 History and Physical Interval Note: 83/male with Anemia, hypotension, FOBT positive stool for EGD with propofol .  12/17/2023 10:00 AM  Peter Mann  has presented today for EGD with propofol , with the diagnosis of Anemia, occult blood in stool.  The various methods of treatment have been discussed with the patient and family. After consideration of risks, benefits and other options for treatment, the patient has consented to  Procedure(s): EGD (ESOPHAGOGASTRODUODENOSCOPY) (N/A) as a surgical intervention.  The patient's history has been reviewed, patient examined, no change in status, stable for surgery.  I have reviewed the patient's chart and labs.  Questions were answered to the patient's satisfaction.     Estelita Manas

## 2023-12-17 NOTE — Anesthesia Postprocedure Evaluation (Signed)
 Anesthesia Post Note  Patient: Peter Mann  Procedure(s) Performed: EGD (ESOPHAGOGASTRODUODENOSCOPY) BIOPSY, SKIN, SUBCUTANEOUS TISSUE, OR MUCOUS MEMBRANE     Patient location during evaluation: Endoscopy Anesthesia Type: MAC Level of consciousness: patient cooperative Pain management: pain level controlled Vital Signs Assessment: post-procedure vital signs reviewed and stable Respiratory status: spontaneous breathing Cardiovascular status: blood pressure returned to baseline Postop Assessment: no apparent nausea or vomiting Anesthetic complications: no   There were no known notable events for this encounter.            Lauraine KATHEE [place]

## 2023-12-17 NOTE — Transfer of Care (Signed)
 Immediate Anesthesia Transfer of Care Note  Patient: Peter Mann  Procedure(s) Performed: EGD (ESOPHAGOGASTRODUODENOSCOPY) BIOPSY, SKIN, SUBCUTANEOUS TISSUE, OR MUCOUS MEMBRANE  Patient Location: Endoscopy Unit  Anesthesia Type:MAC  Level of Consciousness: awake and patient cooperative  Airway & Oxygen Therapy: Patient Spontanous Breathing  Post-op Assessment: Report given to RN and Post -op Vital signs reviewed and stable  Post vital signs: Reviewed and stable  Last Vitals:  Vitals Value Taken Time  BP 122/66 12/17/23 10:36  Temp 37 C 12/17/23 10:35  Pulse 86 12/17/23 10:38  Resp 20 12/17/23 10:38  SpO2 96 % 12/17/23 10:38  Vitals shown include unfiled device data.  Last Pain:  Vitals:   12/17/23 1035  TempSrc: Temporal  PainSc: 0-No pain      Patients Stated Pain Goal: 0 (12/15/23 2214)  Complications: There were no known notable events for this encounter.

## 2023-12-17 NOTE — Progress Notes (Signed)
 [name]                                                                              [place], is a 83 y.o. male, DOB - 08-22-1940, FMW:[id number] Admit date - 12/15/2023    Outpatient Primary MD for the patient is [name], MD  LOS - 2  days  Chief Complaint  Patient presents with   Fatigue       Brief summary   Patient is a 83 year old male with dementia, CVA with left-sided weakness, DM type 2 , COPD, hypertension, chronic anemia, CKD stage 3b, history of prostate cancer presented from [facility] for evaluation of lethargy and hypotension.  On chart review, EMS was called to patient's facility after he suddenly became lethargic after participating in PT. He was found to have a BP of 76/48 at the facility. EMS administered 500 cc of IVF with improvement in BP to 116/60. During evaluation, patient reported that he slipped and fell on a stool a day before the admission.  He endorsed fatigue and mild abdominal discomfort but no nausea, vomiting reported fever, chills, chest pain or shortness of breath. He is unsure if he has had any bloody or black stools.   On chart review, patient had a recent hospitalization at [facility] from 9/9 to 9/19 for AKI in the setting of nontraumatic rhabdomyolysis.  He was discharged to [facility] for rehab.   ED Course: Initial vitals show temp 98.6, RR 20, HR 70, BP 103/55. Initial labs significant for Hgb 7.4, BUNs/creatinine 42/2.14, bicarb 14, glucose 153, CK 38 and positive fecal occult test. 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   Assessment & Plan      Symptomatic anemia  Acute on chronic anemia, iron deficiency anemia -Hemoglobin 7.4 on admission, baseline 9-10 - Pt presented with acute onset of lethargy and hypotension, patient unsure if he has had any melena or hematochezia -Transfused 1 unit packed RBCs, hemoglobin 8.6. - Anemia panel showed iron 15,  TIBC 210, percent saturation ratio 7, ferritin 279 -IV Feraheme x 1 on 10/8 -- Underwent EGD today, normal esophagus, stomach, mucosal changes in the duodenum biopsied. -Continue PPI   Hypotension, resolved - Patient presented due to hypotension at facility with SBP in the 70s, improved after 500 cc bolus by EMS - Continue Coreg  6.25 mg twice daily, resume Imdur , Norvasc  BP now improving   Mild AKI on CKD stage IIIb  - Creatinine of 2.14 around baseline of 1.8-2.2 - Renal function improving, creatinine 2.4 on admission  - Creatinine improving, 1.6 today     History of CVA  HLD - Continue rosuvastatin       Dementia - Patient is alert and oriented, continue delirium precautions    Hx of prostate cancer - Status post radiation   Generalized weakness - In the setting of acute blood loss and hypotension - PT evaluation  Constipation - Continue bowel regimen  Chronic wounds, pressure ulcers, POA - Wound care consulted  Pressure Injury Documentation: Wound 12/16/23 0530 Pressure Injury Foot Left;Medial Unstageable - Full thickness tissue loss in  which the base of the injury is covered by slough (yellow, tan, gray, green or brown) and/or eschar (tan, brown or black) in the wound bed. (Active)     Wound 12/16/23 Pressure Injury Back Right;Upper Deep Tissue Pressure Injury - Purple or maroon localized area of discolored intact skin or blood-filled blister due to damage of underlying soft tissue from pressure and/or shear. (Active)     Wound 12/16/23 0536 Pressure Injury Hip Left Deep Tissue Pressure Injury - Purple or maroon localized area of discolored intact skin or blood-filled blister due to damage of underlying soft tissue from pressure and/or shear. (Active)   Estimated body mass index is 24.52 kg/m as calculated from the following:   Height as of this encounter: 5' 7 (1.702 m).   Weight as of 11/17/23: 71 kg.  Code Status:  DVT Prophylaxis:  SCDs Start: 12/15/23  1927   Level of Care: Level of care: Telemetry Family Communication: Updated patient Disposition Plan:      Remains inpatient appropriate: Per TOC, SNF authorization today and plan for DC tomorrow   Procedures:  Endoscopy  Consultants:   GI  Antimicrobials:   Anti-infectives (From admission, onward)    None          Medications  sodium chloride    Intravenous Once   brimonidine   1 drop Both Eyes BID   carvedilol   6.25 mg Oral BID WC   collagenase   Topical Daily   cyanocobalamin  1,000 mcg Oral Daily   cycloSPORINE   1 drop Both Eyes BID   docusate sodium   200 mg Oral BID   folic acid   1 mg Oral Daily   lubiprostone  24 mcg Oral BID WC   [START ON 12/18/2023] pantoprazole  (PROTONIX ) IV  40 mg Intravenous Q24H   rosuvastatin   20 mg Oral Daily   tamsulosin   0.4 mg Oral Daily      Subjective:   Marchello Rothgeb was seen and examined today.  No acute complaints, alert and awake.  Endoscopy today.    Objective:   Vitals:   12/17/23 1041 12/17/23 1050 12/17/23 1057 12/17/23 1114  BP: (!) 141/60 (!) 155/69  (!) 151/81  Pulse: 77 72 69 72  Resp: 18 15 11    Temp:      TempSrc:      SpO2: 96% 96% 96% 99%  Height:        Intake/Output Summary (Last 24 hours) at 12/17/2023 1342 Last data filed at 12/17/2023 1025 Gross per 24 hour  Intake 1672.11 ml  Output 1050 ml  Net 622.11 ml     Wt Readings from Last 3 Encounters:  11/17/23 71 kg  11/07/23 72.1 kg  10/05/23 73.6 kg    Physical Exam General: Alert and oriented x 3, NAD Cardiovascular: S1 S2 clear, RRR.  Respiratory: CTAB, no wheezing Gastrointestinal: Soft, nontender, nondistended, NBS Ext: no pedal edema bilaterally Neuro: no new deficits Psych: Normal affect    Data Reviewed:  I have personally reviewed following labs    CBC Lab Results  Component Value Date   WBC 3.9 (L) 12/17/2023   RBC 3.55 (L) 12/17/2023   HGB 9.1 (L) 12/17/2023   HCT 29.8 (L) 12/17/2023   MCV 83.9 12/17/2023   MCH  25.6 (L) 12/17/2023   PLT 231 12/17/2023   MCHC 30.5 12/17/2023   RDW 15.2 12/17/2023   LYMPHSABS 0.6 (L) 11/27/2023   MONOABS 0.7 11/27/2023   EOSABS 0.2 11/27/2023   BASOSABS 0.0 11/27/2023  Last metabolic panel Lab Results  Component Value Date   NA 142 12/17/2023   K 3.9 12/17/2023   CL 113 (H) 12/17/2023   CO2 19 (L) 12/17/2023   BUN 29 (H) 12/17/2023   CREATININE 1.68 (H) 12/17/2023   GLUCOSE 90 12/17/2023   GFRNONAA 40 (L) 12/17/2023   GFRAA 58 (L) 04/16/2018   CALCIUM  9.4 12/17/2023   PHOS 3.8 12/17/2023   PROT 5.9 (L) 12/15/2023   ALBUMIN 3.1 (L) 12/17/2023   BILITOT 0.2 12/15/2023   ALKPHOS 53 12/15/2023   AST 17 12/15/2023   ALT 15 12/15/2023   ANIONGAP 10 12/17/2023    CBG (last 3)  Recent Labs    12/15/23 1458 12/17/23 1041  GLUCAP 166* 96      Coagulation Profile: No results for input(s): INR, PROTIME in the last 168 hours.   Radiology Studies: I have personally reviewed the imaging studies  DG Hip Unilat W or Wo Pelvis 2-3 Views Right Result Date: 12/15/2023 CLINICAL DATA:  Wound. EXAM: DG HIP (WITH OR WITHOUT PELVIS) 2-3V RIGHT COMPARISON:  None Available. FINDINGS: There is no evidence of hip fracture or dislocation. Mild narrowing and osteophyte formation of right hip is noted. IMPRESSION: Mild degenerative joint disease of right hip. No acute abnormality seen. Electronically Signed   By: Lynwood Landy Raddle M.D.   On: 12/15/2023 16:59       Zorana Brockwell M.D. [name] 12/17/2023, 1:42 PM  Available via Epic secure chat 7am-7pm After 7 pm, please refer to night coverage provider listed on amion.

## 2023-12-17 NOTE — Telephone Encounter (Signed)
 Patient referred to infusion pharmacy team for ambulatory infusion of IV iron.  Insurance - [employer] Medicare  Site of care - Site of care: [facility] Dx code - D64.9/D50.9 IV Iron Therapy - Feraheme 510 mg IV x 1. Patient received Venofer 200 mg Iv x 1  Infusion appointments - Scheduling team will schedule patient as soon as possible.   Peter Mann D. Peter Mann, PharmD

## 2023-12-17 NOTE — Anesthesia Procedure Notes (Signed)
 Procedure Name: MAC Date/Time: 12/17/2023 10:13 AM  Performed by: Erick Fitz, CRNAPre-anesthesia Checklist: Patient identified, Emergency Drugs available, Suction available, Patient being monitored and Timeout performed Patient Re-evaluated:Patient Re-evaluated prior to induction Oxygen Delivery Method: Simple face mask Induction Type: IV induction Placement Confirmation: positive ETCO2 and CO2 detector Dental Injury: Teeth and Oropharynx as per pre-operative assessment

## 2023-12-17 NOTE — TOC Initial Note (Signed)
 Transition of Care [facility]) - Initial/Assessment Note    Patient Details  Name: Peter Mann MRN: [id number] Date of Birth: 04-Nov-1940  [facility]) CM/SW Contact:    Bascom Service, RN Phone Number: 12/17/2023, 3:50 PM  Clinical Narrative: From [facility] for return ST SNF;faxed out to [facility], initiated shara [id number] auth.Spoke to dtr Tammy-agree to d/c plan.                  Expected Discharge Plan: Skilled Nursing Facility Barriers to Discharge: Continued Medical Work up   Patient Goals and CMS Choice Patient states their goals for this hospitalization and ongoing recovery are:: Return back to [facility].gov Compare Post Acute Care list provided to::  (Tammy(dtr)) Choice offered to / list presented to : Adult Children [place] ownership interest in [facility].provided to:: Adult Children    Expected Discharge Plan and Services                                              Prior Living Arrangements/Services                       Activities of Daily Living   ADL Screening (condition at time of admission) Independently performs ADLs?: No Does the patient have a NEW difficulty with bathing/dressing/toileting/self-feeding that is expected to last >3 days?: No Does the patient have a NEW difficulty with getting in/out of bed, walking, or climbing stairs that is expected to last >3 days?: No Does the patient have a NEW difficulty with communication that is expected to last >3 days?: No Is the patient deaf or have difficulty hearing?: No Does the patient have difficulty seeing, even when wearing glasses/contacts?: Yes Does the patient have difficulty concentrating, remembering, or making decisions?: Yes  Permission Sought/Granted                  Emotional Assessment              Admission diagnosis:  Dehydration [E86.0] Symptomatic anemia [D64.9] Gastrointestinal hemorrhage, unspecified  gastrointestinal hemorrhage type [K92.2] Anemia, unspecified type [D64.9] Patient Active Problem List   Diagnosis Date Noted   Iron deficiency anemia 12/17/2023   Gastrointestinal hemorrhage 12/16/2023   Hypotension due to hypovolemia 12/16/2023   CKD stage 3b, GFR 30-44 ml/min (HCC) 12/16/2023   History of prostate cancer 12/16/2023   Acute on chronic anemia 12/16/2023   Symptomatic anemia 12/15/2023   Rhabdomyolysis 11/18/2023   Elevated LFTs 11/18/2023   Acute renal failure (ARF) 11/18/2023   ARF (acute renal failure) 11/17/2023   Generalized weakness 05/08/2023   History of stroke 05/08/2023   Anemia 05/08/2023   Thrombocytopenia 05/08/2023   Fall at home, initial encounter 05/08/2023   Influenza A 05/08/2023   Constipation 05/08/2023   BPH (benign prostatic hyperplasia) 05/08/2023   Pseudophakia of left eye 05/22/2021   Nuclear sclerotic cataract of right eye 02/15/2020   Exudative age-related macular degeneration of left eye with active choroidal neovascularization (HCC) 07/11/2019   Serous detachment of retinal pigment epithelium of left eye 07/11/2019   Posterior vitreous detachment of right eye 07/11/2019   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 07/11/2019   Posterior vitreous detachment of left eye 07/11/2019   Smoker 03/17/2016   Facial palsy    Acute CVA (cerebrovascular accident) (HCC)  HLD (hyperlipidemia)    Diabetes mellitus type 2, noninsulin dependent (HCC)    CVA (cerebral vascular accident) (HCC) 08/23/2015   Abdominal distention    Gangrenous cholecystitis    Ileus (HCC)    Abdominal pain 01/28/2014   Cholelithiasis 01/28/2014   Cholecystitis 01/28/2014   Symptomatic cholelithiasis 01/28/2014   Hyperglycemia 01/28/2014   Chest pain 02/01/2013   Essential hypertension 02/01/2013   Hyperlipidemia 02/01/2013   PCP:  [name], MD Pharmacy:   [facility] [address], [place] - [address] [place] [address] Phone: [phone] Fax: [phone]  [facility] [address] [place] [address] Phone: [phone] Fax: [phone]     Social Drivers of Health (SDOH) Social History: SDOH Screenings   Food Insecurity: No Food Insecurity (12/16/2023)  Housing: Low Risk  (12/16/2023)  Transportation Needs: No Transportation Needs (12/16/2023)  Utilities: Not At Risk (12/16/2023)  Social Connections: Moderately Integrated (12/16/2023)  Tobacco Use: Medium Risk (12/17/2023)   SDOH Interventions:     Readmission Risk Interventions     No data to display

## 2023-12-17 NOTE — Anesthesia Preprocedure Evaluation (Addendum)
 Anesthesia Evaluation  Patient identified by MRN, date of birth, ID bandGeneral Assessment Comment:Dementia at baseline  Reviewed: Allergy & Precautions, NPO status , Patient's Chart, lab work & pertinent test results  History of Anesthesia Complications Negative for: history of anesthetic complications  Airway Mallampati: II  TM Distance: >3 FB     Dental  (+) Dental Advisory Given, Poor Dentition   Pulmonary COPD, former smoker   breath sounds clear to auscultation       Cardiovascular hypertension, (-) CAD and (-) Cardiac Stents (-) dysrhythmias (-) pacemaker Rhythm:Regular Rate:Normal     Neuro/Psych Dementia CVA (L Hemibody Weakness)    GI/Hepatic   Endo/Other  diabetes, Type 2    Renal/GU Renal InsufficiencyRenal disease     Musculoskeletal   Abdominal   Peds  Hematology   Anesthesia Other Findings   Reproductive/Obstetrics                              Anesthesia Physical Anesthesia Plan  ASA: 3  Anesthesia Plan: MAC   Post-op Pain Management:    Induction: Intravenous  PONV Risk Score and Plan: 1  Airway Management Planned: Simple Face Mask  Additional Equipment:   Intra-op Plan:   Post-operative Plan: Extubation in OR  Informed Consent:      Dental advisory given and Consent reviewed with POA  Plan Discussed with:   Anesthesia Plan Comments: (Discussed patient's history and risks/benefits of anesthetic plan with patient's daughter, Madelin, via phone. Consent obtained and signed. )         Anesthesia Quick Evaluation

## 2023-12-17 NOTE — Telephone Encounter (Signed)
 Auth Submission: NO AUTH NEEDED Site of care: [facility] Payer: [employer] MEDICARE DUAL Medication & CPT/J Code(s) submitted: Feraheme (ferumoxytol) [id number] Diagnosis Code: D50.9, D64.9 Route of submission (phone, fax, portal):  Phone # Fax # Auth type: Buy/Bill HB Units/visits requested: 510mg  x 1 dose Reference number: [id number] Approval from: 12/17/2023 to 03/09/24     Dagoberto Armour, CPhT [facility] Phone: [phone] 12/17/2023

## 2023-12-17 NOTE — Op Note (Signed)
 [facility] Patient Name: Peter Mann Procedure Date: 12/17/2023 MRN: [id number] Attending MD: Estelita Manas , MD, [id number] Date of Birth: 1940-07-10 CSN: [id number] Age: 83 Admit Type: Inpatient Procedure:                Upper GI endoscopy Indications:              Unexplained iron deficiency anemia, Occult blood in                            stool Providers:                Estelita Manas, MD, Gregoria Pierce, RN, Corene Southgate,                            Technician Referring MD:             [name] Medicines:                Monitored Anesthesia Care Complications:            No immediate complications. Estimated blood loss:                            Minimal. Estimated Blood Loss:     Estimated blood loss was minimal. Procedure:                Pre-Anesthesia Assessment:                           - Prior to the procedure, a History and Physical                            was performed, and patient medications and                            allergies were reviewed. The patient's tolerance of                            previous anesthesia was also reviewed. The risks                            and benefits of the procedure and the sedation                            options and risks were discussed with the patient.                            All questions were answered, and informed consent                            was obtained. Prior Anticoagulants: The patient has                            taken no anticoagulant or antiplatelet agents                            except for aspirin . ASA  Grade Assessment: III - A                            patient with severe systemic disease. After                            reviewing the risks and benefits, the patient was                            deemed in satisfactory condition to undergo the                            procedure.                           After obtaining informed consent, the endoscope was                             passed under direct vision. Throughout the                            procedure, the patient's blood pressure, pulse, and                            oxygen saturations were monitored continuously. The                            [id number]-[id number] ([id number]) Olympus endoscope was introduced                            through the mouth, and advanced to the second part                            of duodenum. The upper GI endoscopy was                            accomplished without difficulty. The patient                            tolerated the procedure well. Scope In: Scope Out: Findings:      The examined esophagus was normal.      The Z-line was regular and was found 40 cm from the incisors.      The entire examined stomach was normal.      The cardia and gastric fundus were normal on retroflexion.      Localized severe mucosal changes characterized by nodularity and altered       texture, polypoid appearance were found in the duodenal bulb. Biopsies       were taken with a cold forceps for histology. Impression:               - Normal esophagus.                           - Z-line regular, 40 cm from the incisors.                           -  Normal stomach.                           - Mucosal changes in the duodenum. Biopsied.                           (Rectal exam showed green stool). Moderate Sedation:      Patient did not receive moderate sedation for this procedure, but       instead received monitored anesthesia care. Recommendation:           - Resume regular diet.                           - Continue present medications.                           - Await pathology results. Procedure Code(s):        --- Professional ---                           [phone], Esophagogastroduodenoscopy, flexible,                            transoral; with biopsy, single or multiple Diagnosis Code(s):        --- Professional ---                           K31.89, Other diseases of stomach and  duodenum                           D50.9, Iron deficiency anemia, unspecified                           R19.5, Other fecal abnormalities CPT copyright 2022 American Medical Association. All rights reserved. The codes documented in this report are preliminary and upon coder review may  be revised to meet current compliance requirements. Estelita Manas, MD 12/17/2023 10:35:18 AM This report has been signed electronically. Number of Addenda: 0

## 2023-12-17 NOTE — NC FL2 (Signed)
 [place]  MEDICAID FL2 LEVEL OF CARE FORM     IDENTIFICATION  Patient Name: Peter Mann Birthdate: 07/16/1940 Sex: male Admission Date (Current Location): 12/15/2023  [facility] and [place] Number:  Producer, television/film/video and Address:  [facility],  [address] [place], [place] [address]      Provider Number: [id number]  Attending Physician Name and Address:  Davia Nydia POUR, MD  Relative Name and Phone Number:  Tammy Patterson(dtr)    Current Level of Care: Hospital Recommended Level of Care: Skilled Nursing Facility Prior Approval Number:    Date Approved/Denied:   PASRR Number: [id number]  Discharge Plan: SNF    Current Diagnoses: Patient Active Problem List   Diagnosis Date Noted   Iron deficiency anemia 12/17/2023   Gastrointestinal hemorrhage 12/16/2023   Hypotension due to hypovolemia 12/16/2023   CKD stage 3b, GFR 30-44 ml/min (HCC) 12/16/2023   History of prostate cancer 12/16/2023   Acute on chronic anemia 12/16/2023   Symptomatic anemia 12/15/2023   Rhabdomyolysis 11/18/2023   Elevated LFTs 11/18/2023   Acute renal failure (ARF) 11/18/2023   ARF (acute renal failure) 11/17/2023   Generalized weakness 05/08/2023   History of stroke 05/08/2023   Anemia 05/08/2023   Thrombocytopenia 05/08/2023   Fall at home, initial encounter 05/08/2023   Influenza A 05/08/2023   Constipation 05/08/2023   BPH (benign prostatic hyperplasia) 05/08/2023   Pseudophakia of left eye 05/22/2021   Nuclear sclerotic cataract of right eye 02/15/2020   Exudative age-related macular degeneration of left eye with active choroidal neovascularization (HCC) 07/11/2019   Serous detachment of retinal pigment epithelium of left eye 07/11/2019   Posterior vitreous detachment of right eye 07/11/2019   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 07/11/2019   Posterior vitreous detachment of left eye 07/11/2019   Smoker 03/17/2016   Facial palsy     Acute CVA (cerebrovascular accident) (HCC)    HLD (hyperlipidemia)    Diabetes mellitus type 2, noninsulin dependent (HCC)    CVA (cerebral vascular accident) (HCC) 08/23/2015   Abdominal distention    Gangrenous cholecystitis    Ileus (HCC)    Abdominal pain 01/28/2014   Cholelithiasis 01/28/2014   Cholecystitis 01/28/2014   Symptomatic cholelithiasis 01/28/2014   Hyperglycemia 01/28/2014   Chest pain 02/01/2013   Essential hypertension 02/01/2013   Hyperlipidemia 02/01/2013    Orientation RESPIRATION BLADDER Height & Weight     Self, Time  Normal External catheter Weight:   Height:  5' 7 (170.2 cm)  BEHAVIORAL SYMPTOMS/MOOD NEUROLOGICAL BOWEL NUTRITION STATUS      Continent Diet (dysphagia 3)  AMBULATORY STATUS COMMUNICATION OF NEEDS Skin   Limited Assist Verbally Other (Comment) (see d/c summary for wound care.)                       Personal Care Assistance Level of Assistance  Bathing, Feeding, Dressing Bathing Assistance: Limited assistance Feeding assistance: Limited assistance Dressing Assistance: Limited assistance     Functional Limitations Info  Sight, Hearing, Speech Sight Info: Adequate Hearing Info: Adequate Speech Info: Adequate    SPECIAL CARE FACTORS FREQUENCY  PT (By licensed PT), OT (By licensed OT)     PT Frequency: 5x week OT Frequency: 5x week            Contractures Contractures Info: Not present    Additional Factors Info  Code Status, Allergies Code Status Info: Full Allergies Info: NKDA  Current Medications (12/17/2023):  This is the current hospital active medication list Current Facility-Administered Medications  Medication Dose Route Frequency Provider Last Rate Last Admin   0.9 %  sodium chloride  infusion (Manually program via Guardrails IV Fluids)   Intravenous Once Karki, Arya, MD       acetaminophen  (TYLENOL ) tablet 650 mg  650 mg Oral Q6H PRN Karki, Arya, MD   650 mg at 12/15/23 2221   Or    acetaminophen  (TYLENOL ) suppository 650 mg  650 mg Rectal Q6H PRN Saintclair Jasper, MD       amLODipine  (NORVASC ) tablet 10 mg  10 mg Oral Daily Rai, Ripudeep K, MD   10 mg at 12/17/23 1454   bisacodyl  (DULCOLAX) EC tablet 5 mg  5 mg Oral Daily PRN Karki, Arya, MD       brimonidine  (ALPHAGAN ) 0.15 % ophthalmic solution 1 drop  1 drop Both Eyes BID Karki, Arya, MD   1 drop at 12/17/23 1305   carvedilol  (COREG ) tablet 6.25 mg  6.25 mg Oral BID WC Karki, Arya, MD   6.25 mg at 12/16/23 1659   collagenase (SANTYL) ointment   Topical Daily Saintclair Jasper, MD   Given at 12/17/23 0804   cyanocobalamin (VITAMIN B12) tablet 1,000 mcg  1,000 mcg Oral Daily Karki, Arya, MD   1,000 mcg at 12/17/23 1303   cycloSPORINE  (RESTASIS ) 0.05 % ophthalmic emulsion 1 drop  1 drop Both Eyes BID Karki, Arya, MD   1 drop at 12/17/23 1303   docusate sodium  (COLACE) capsule 200 mg  200 mg Oral BID Karki, Arya, MD   200 mg at 12/17/23 1302   folic acid  (FOLVITE ) tablet 1 mg  1 mg Oral Daily Karki, Arya, MD   1 mg at 12/17/23 1302   isosorbide  mononitrate (IMDUR ) 24 hr tablet 30 mg  30 mg Oral Daily Rai, Ripudeep K, MD   30 mg at 12/17/23 1454   lubiprostone (AMITIZA) capsule 24 mcg  24 mcg Oral BID WC Karki, Arya, MD   24 mcg at 12/17/23 1259   ondansetron  (ZOFRAN ) tablet 4 mg  4 mg Oral Q6H PRN Karki, Arya, MD       Or   ondansetron  (ZOFRAN ) injection 4 mg  4 mg Intravenous Q6H PRN Karki, Arya, MD       Oral care mouth rinse  15 mL Mouth Rinse PRN Saintclair Jasper, MD       [START ON 12/18/2023] pantoprazole  (PROTONIX ) injection 40 mg  40 mg Intravenous Q24H Karki, Arya, MD       rosuvastatin  (CRESTOR ) tablet 20 mg  20 mg Oral Daily Karki, Arya, MD   20 mg at 12/17/23 1303   senna-docusate (Senokot-S) tablet 1 tablet  1 tablet Oral QHS PRN Karki, Arya, MD       tamsulosin  (FLOMAX ) capsule 0.4 mg  0.4 mg Oral Daily Karki, Arya, MD   0.4 mg at 12/17/23 1302     Discharge Medications: Please see discharge summary for a list of discharge  medications.  Relevant Imaging Results:  Relevant Lab Results:   Additional Information SS#[id number] [address] [place], [place]

## 2023-12-18 ENCOUNTER — Inpatient Hospital Stay (HOSPITAL_COMMUNITY)

## 2023-12-18 ENCOUNTER — Encounter (HOSPITAL_COMMUNITY): Payer: Self-pay | Admitting: Gastroenterology

## 2023-12-18 DIAGNOSIS — D649 Anemia, unspecified: Secondary | ICD-10-CM | POA: Diagnosis not present

## 2023-12-18 DIAGNOSIS — K922 Gastrointestinal hemorrhage, unspecified: Secondary | ICD-10-CM | POA: Diagnosis not present

## 2023-12-18 DIAGNOSIS — D500 Iron deficiency anemia secondary to blood loss (chronic): Secondary | ICD-10-CM | POA: Diagnosis not present

## 2023-12-18 DIAGNOSIS — E860 Dehydration: Secondary | ICD-10-CM | POA: Diagnosis not present

## 2023-12-18 LAB — RENAL FUNCTION PANEL
Albumin: 2.9 g/dL — ABNORMAL LOW (ref 3.5–5.0)
Anion gap: 9 (ref 5–15)
BUN: 38 mg/dL — ABNORMAL HIGH (ref 8–23)
CO2: 19 mmol/L — ABNORMAL LOW (ref 22–32)
Calcium: 9 mg/dL (ref 8.9–10.3)
Chloride: 113 mmol/L — ABNORMAL HIGH (ref 98–111)
Creatinine, Ser: 2.3 mg/dL — ABNORMAL HIGH (ref 0.61–1.24)
GFR, Estimated: 27 mL/min — ABNORMAL LOW (ref >60)
Glucose, Bld: 139 mg/dL — ABNORMAL HIGH (ref 70–99)
Phosphorus: 3.2 mg/dL (ref 2.5–4.6)
Potassium: 4.1 mmol/L (ref 3.5–5.1)
Sodium: 140 mmol/L (ref 135–145)

## 2023-12-18 LAB — CBC
HCT: 27.8 % — ABNORMAL LOW (ref 39.0–52.0)
Hemoglobin: 8.3 g/dL — ABNORMAL LOW (ref 13.0–17.0)
MCH: 25.3 pg — ABNORMAL LOW (ref 26.0–34.0)
MCHC: 29.9 g/dL — ABNORMAL LOW (ref 30.0–36.0)
MCV: 84.8 fL (ref 80.0–100.0)
Platelets: 220 K/uL (ref 150–400)
RBC: 3.28 MIL/uL — ABNORMAL LOW (ref 4.22–5.81)
RDW: 15.1 % (ref 11.5–15.5)
WBC: 5.4 K/uL (ref 4.0–10.5)
nRBC: 0 % (ref 0.0–0.2)

## 2023-12-18 LAB — TYPE AND SCREEN
ABO/RH(D): O POS
Antibody Screen: NEGATIVE

## 2023-12-18 LAB — SURGICAL PATHOLOGY

## 2023-12-18 LAB — PREPARE RBC (CROSSMATCH)

## 2023-12-18 NOTE — Progress Notes (Signed)
     Patient Name: Peter Mann           DOB: May 30, 1940  MRN: [id number]      Admission Date: 12/15/2023  Attending Provider: Davia Nydia POUR, MD  Primary Diagnosis: Symptomatic anemia   Level of care: Telemetry   OVERNIGHT EVENT  HPI/ Events of Note Peter Mann, 83 y.o. male, was admitted on 12/15/2023 for Symptomatic anemia.  Notified by bedside RN of patient fall.    Unwitnessed fall Fall occurred while patient was attempting to get out of bed independently.  Patient reports that his foot slipped, causing him to fall on his bottom.  He also reports hitting his head.   Patient is currently A/O x4.  He denies any loss of consciousness.  No focal neuro changes.  He denies any lightheadedness, dizziness, vision change. There are no visible signs of injury or deformities.  Skin appears intact.  Range of motion at baseline. He reports generalized pain, aching all over my body.   Plan: CT head -  Nursing staff to continue monitoring for change in acute symptoms, mobility, and pain. Orthostatic vitals Fall precaution, fall mat, bed alarm in place TeleSitter   Addendum:    Courtlyn Aki, DNP, ACNPC- AG [name] [place]

## 2023-12-18 NOTE — Progress Notes (Signed)
 Subjective: No reported bleeding.  Objective: Vital signs in last 24 hours: Temp:  [97.6 F (36.4 C)-99 F (37.2 C)] 97.6 F (36.4 C) (10/10 0511) Pulse Rate:  [83-106] 83 (10/10 0511) Resp:  [14-18] 14 (10/10 0511) BP: (122-193)/(65-97) 133/65 (10/10 0511) SpO2:  [96 %-100 %] 100 % (10/10 0511) Weight change:  Last BM Date : 12/17/23  PE: GEN:  Elderly, frail-appearing, minimally interactive ABD:  Soft, non-tender NEURO:  Somnolent but arousable, answers some questions  Lab Results: CBC    Component Value Date/Time   WBC 5.4 12/18/2023 0542   RBC 3.28 (L) 12/18/2023 0542   HGB 8.3 (L) 12/18/2023 0542   HCT 27.8 (L) 12/18/2023 0542   PLT 220 12/18/2023 0542   MCV 84.8 12/18/2023 0542   MCH 25.3 (L) 12/18/2023 0542   MCHC 29.9 (L) 12/18/2023 0542   RDW 15.1 12/18/2023 0542   LYMPHSABS 0.6 (L) 11/27/2023 0303   MONOABS 0.7 11/27/2023 0303   EOSABS 0.2 11/27/2023 0303   BASOSABS 0.0 11/27/2023 0303  CMP     Component Value Date/Time   NA 140 12/18/2023 0542   K 4.1 12/18/2023 0542   CL 113 (H) 12/18/2023 0542   CO2 19 (L) 12/18/2023 0542   GLUCOSE 139 (H) 12/18/2023 0542   BUN 38 (H) 12/18/2023 0542   CREATININE 2.30 (H) 12/18/2023 0542   CREATININE 1.27 (H) 04/08/2022 0958   CALCIUM  9.0 12/18/2023 0542   PROT 5.9 (L) 12/15/2023 1518   ALBUMIN 2.9 (L) 12/18/2023 0542   AST 17 12/15/2023 1518   ALT 15 12/15/2023 1518   ALKPHOS 53 12/15/2023 1518   BILITOT 0.2 12/15/2023 1518   EGFR 56 (L) 04/08/2022 0958   GFRNONAA 27 (L) 12/18/2023 0542    Assessment:   Anemia, with hemoccult-positive stools. Endoscopy with duodenal irregularity, biopsies pending. Dementia.  Plan:   Patient is not currently in any state to tolerate bowel prep for colonoscopy.  Furthermore, I am not convinced he would be competent to consent for procedure and I don't think colonoscopy is appropriate at this time.  In absence of life-threatening bleeding, it is highly probable that he's  not a candidate for any type of intervention regardless of what a colonoscopy would find. Follow CBC, transfuse as needed, supportive care. We are happy to arrange outpatient follow-up with us . [facility] will follow along at a distance; please call with questions.   Peter Mann 12/18/2023, 12:31 PM   Cell [phone] If no answer or after 5 PM call [phone]

## 2023-12-18 NOTE — TOC Transition Note (Signed)
 Transition of Care [facility]) - Discharge Note   Patient Details  Name: Qunicy Higinbotham MRN: [id number] Date of Birth: September 07, 1940  [facility]) CM/SW Contact:  Bascom Service, RN Phone Number: 12/18/2023, 2:01 PM   Clinical Narrative:   PTAR called. going to rm#[id number],report#[phone].No further CM needs    Final next level of care: Skilled Nursing Facility Barriers to Discharge: No Barriers Identified   Patient Goals and CMS Choice Patient states their goals for this hospitalization and ongoing recovery are:: Return back to [facility].gov Compare Post Acute Care list provided to::  (Tammy(dtr)) Choice offered to / list presented to : Adult Children [place] ownership interest in [facility].provided to:: Adult Children    Discharge Placement                       Discharge Plan and Services Additional resources added to the After Visit Summary for                                       Social Drivers of Health (SDOH) Interventions SDOH Screenings   Food Insecurity: No Food Insecurity (12/16/2023)  Housing: Low Risk  (12/16/2023)  Transportation Needs: No Transportation Needs (12/16/2023)  Utilities: Not At Risk (12/16/2023)  Social Connections: Moderately Integrated (12/16/2023)  Tobacco Use: Medium Risk (12/17/2023)     Readmission Risk Interventions    12/17/2023    3:52 PM  Readmission Risk Prevention Plan  Transportation Screening Complete  PCP or Specialist Appt within 3-5 Days Complete  HRI or Home Care Consult Complete  Social Work Consult for Recovery Care Planning/Counseling Complete  Palliative Care Screening Complete  Medication Review Oceanographer) Complete

## 2023-12-18 NOTE — Progress Notes (Signed)
 Report called to Jasmine at [facility] regarding patient transfer. Patient left the floor via stretcher per PTAR.

## 2023-12-18 NOTE — TOC Progression Note (Addendum)
 Transition of Care [facility]) - Progression Note    Patient Details  Name: Nagee Goates MRN: [id number] Date of Birth: 03-22-1940  [facility]) CM/SW Contact  Ramie Erman, Nathanel, RN Phone Number: 12/18/2023, 11:11 AM  Clinical Narrative: awaiting confirmation through [employer] portal of auth approval received plan shara pi#[id number],jluy pi#[id number] message to confirm if approved for return back to [facility] SNF-rep Tonya aware also-await outcome. MD updated.  -1:25p-Confirmed auth received can return back to [facility] rep Yutan aware. MD updated.     Expected Discharge Plan: Skilled Nursing Facility Barriers to Discharge: Insurance Authorization               Expected Discharge Plan and Services                                               Social Drivers of Health (SDOH) Interventions SDOH Screenings   Food Insecurity: No Food Insecurity (12/16/2023)  Housing: Low Risk  (12/16/2023)  Transportation Needs: No Transportation Needs (12/16/2023)  Utilities: Not At Risk (12/16/2023)  Social Connections: Moderately Integrated (12/16/2023)  Tobacco Use: Medium Risk (12/17/2023)    Readmission Risk Interventions    12/17/2023    3:52 PM  Readmission Risk Prevention Plan  Transportation Screening Complete  PCP or Specialist Appt within 3-5 Days Complete  HRI or Home Care Consult Complete  Social Work Consult for Recovery Care Planning/Counseling Complete  Palliative Care Screening Complete  Medication Review Oceanographer) Complete

## 2023-12-18 NOTE — Discharge Summary (Signed)
 Physician Discharge Summary   Patient: Peter Mann MRN: [id number] DOB: 08-Sep-1940  Admit date:     12/15/2023  Discharge date: 12/18/23  Discharge Physician: Nydia Distance, MD    PCP: [name], MD   Recommendations at discharge:   Outpatient follow-up with [facility] gastroenterology in 2 weeks Resume aspirin  on 12/21/2023 Outpatient IV iron infusion has been arranged, please follow-up  Discharge Diagnoses:    Symptomatic anemia, GI bleed    Generalized weakness   Gastrointestinal hemorrhage   Hypotension due to hypovolemia   CKD stage 3b, GFR 30-44 ml/min (HCC)   History of prostate cancer   Acute on chronic iron deficiency anemia  Hospital Course:  Patient is a 83 year old male with dementia, CVA with left-sided weakness, DM type 2 , COPD, hypertension, chronic anemia, CKD stage 3b, history of prostate cancer presented from [facility] for evaluation of lethargy and hypotension.  On chart review, EMS was called to patient's facility after he suddenly became lethargic after participating in PT. He was found to have a BP of 76/48 at the facility. EMS administered 500 cc of IVF with improvement in BP to 116/60. During evaluation, patient reported that he slipped and fell on a stool a day before the admission.  He endorsed fatigue and mild abdominal discomfort but no nausea, vomiting reported fever, chills, chest pain or shortness of breath. He is unsure if he has had any bloody or black stools.   On chart review, patient had a recent hospitalization at [facility] from 9/9 to 9/19 for AKI in the setting of nontraumatic rhabdomyolysis.  He was discharged to [facility] for rehab.   ED Course: Initial vitals show temp 98.6, RR 20, HR 70, BP 103/55. Initial labs significant for Hgb 7.4, BUNs/creatinine 42/2.14, bicarb 14, glucose 153, CK 38 and positive fecal occult test. 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   Assessment and  Plan:   Symptomatic anemia  Acute on chronic anemia, iron deficiency anemia -Hemoglobin 7.4 on admission, baseline 9-10 - Pt presented with acute onset of lethargy and hypotension, patient unsure if he has had any melena or hematochezia -Transfused 1 unit packed RBCs - Anemia panel showed iron 15, TIBC 210, percent saturation ratio 7, ferritin 279 -IV Feraheme x 1 on 10/8 -- Underwent EGD on 10/9  normal esophagus, stomach, mucosal changes in the duodenum biopsied.  [facility] will call the facility for the biopsy results. -Continue PPI -Hemoglobin 8.3 at discharge. -Per GI, Dr. Torrin, not convinced that he would be competent to consent for colonoscopy and do not think colonoscopy is appropriate at this time in the absence of life-threatening bleeding, he is not a candidate for any type of intervention.  Recommended outpatient follow-up with GI   Hypotension, resolved - Patient presented due to hypotension at facility with SBP in the 70s, improved after 500 cc bolus by EMS - Continue Coreg , Imdur , Norvasc . -Continue to hold hydralazine      Mild AKI on CKD stage IIIb  - Creatinine of 2.14 around baseline of 1.8-2.2 - Patient received IV fluid hydration, creatinine 2.3 at discharge, around the baseline - Encourage p.o. diet    History of CVA  HLD - Continue rosuvastatin  -Resume aspirin  on 10/13      Dementia - Patient is alert and oriented, continue delirium precautions    Hx of prostate cancer - Status post radiation   Generalized weakness - In the setting of acute blood loss and  hypotension - PT evaluation   Constipation - Continue bowel regimen   Chronic wounds, pressure ulcers, POA - Wound care consulted  Pressure Injury Documentation: Wound 12/16/23 0530 Pressure Injury Foot Left;Medial Unstageable - Full thickness tissue loss in which the base of the injury is covered by slough (yellow, tan, gray, green or brown) and/or eschar (tan, brown or black) in the wound bed.  (Active)     Wound 12/16/23 Pressure Injury Back Right;Upper Deep Tissue Pressure Injury - Purple or maroon localized area of discolored intact skin or blood-filled blister due to damage of underlying soft tissue from pressure and/or shear. (Active)     Wound 12/16/23 0536 Pressure Injury Hip Left Deep Tissue Pressure Injury - Purple or maroon localized area of discolored intact skin or blood-filled blister due to damage of underlying soft tissue from pressure and/or shear. (Active)    Estimated body mass index is 24.52 kg/m as calculated from the following:   Height as of this encounter: 5' 7 (1.702 m).   Weight as of 11/17/23: 71 kg.        Pain control - [place]  Controlled Substance Reporting System database was reviewed. and patient was instructed, not to drive, operate heavy machinery, perform activities at heights, swimming or participation in water  activities or provide baby-sitting services while on Pain, Sleep and Anxiety Medications; until their outpatient Physician has advised to do so again. Also recommended to not to take more than prescribed Pain, Sleep and Anxiety Medications.  Consultants: Gastroenterology Procedures performed: EGD Disposition: Skilled nursing facility Diet recommendation:  Discharge Diet Orders (From admission, onward)     Start     Ordered   12/18/23 0000  Diet - low sodium heart healthy        12/18/23 1325            DISCHARGE MEDICATION: Allergies as of 12/18/2023   No Known Allergies      Medication List     PAUSE taking these medications    aspirin  EC 81 MG tablet Wait to take this until: December 21, 2023 Take 1 tablet (81 mg total) by mouth daily. Swallow whole.   hydrALAZINE  50 MG tablet Wait to take this until your doctor or other care provider tells you to start again. Commonly known as: APRESOLINE  Take 1 tablet (50 mg total) by mouth every 8 (eight) hours.       TAKE these medications    Alphagan  P 0.1 %  Soln Generic drug: brimonidine  Place 1 drop into both eyes 2 (two) times daily.   amLODipine  10 MG tablet Commonly known as: NORVASC  Take 10 mg by mouth daily.   bisacodyl  10 MG suppository Commonly known as: DULCOLAX Place 10 mg rectally daily as needed for moderate constipation.   carvedilol  6.25 MG tablet Commonly known as: COREG  Take 1 tablet (6.25 mg total) by mouth 2 (two) times daily with a meal.   Cequa  0.09 % Soln Generic drug: cycloSPORINE  (PF) Place 1 drop into both eyes 2 (two) times daily.   cyanocobalamin 1000 MCG tablet Commonly known as: VITAMIN B12 Take 1 tablet (1,000 mcg total) by mouth daily.   dexlansoprazole 60 MG capsule Commonly known as: DEXILANT Take 60 mg by mouth daily.   diclofenac sodium 1 % Gel Commonly known as: VOLTAREN Apply 2 g topically as needed (pain).   docusate sodium  100 MG capsule Commonly known as: COLACE Take 2 capsules (200 mg total) by mouth 2 (two) times daily.   ferrous sulfate  325 (  65 FE) MG tablet Take 1 tablet (325 mg total) by mouth 2 (two) times daily with a meal.   folic acid  1 MG tablet Commonly known as: FOLVITE  Take 1 tablet (1 mg total) by mouth daily.   insulin  aspart 100 UNIT/ML injection Commonly known as: novoLOG  Inject 0-9 Units into the skin 3 (three) times daily with meals. insulin  aspart (novoLOG ) injection 0-9 Units  0-9 Units, Subcutaneous, 3 times daily with meals, First dose on Wed 11/18/23 at 0800 Correction coverage: Sensitive (thin, NPO, renal) CBG < 70: Implement Hypoglycemia Standing Orders and refer to Hypoglycemia Standing Orders sidebar report CBG 70 - 120: 0 units CBG 121 - 150: 1 unit CBG 151 - 200: 2 units CBG 201 - 250: 3 units CBG 251 - 300: 5 units CBG 301 - 350: 7 units CBG 351 - 400: 9 units CBG > 400: call MD   isosorbide  mononitrate 30 MG 24 hr tablet Commonly known as: IMDUR  Take 1 tablet (30 mg total) by mouth daily.   Linzess  290 MCG Caps capsule Generic drug:  linaclotide  Take 290 mcg by mouth daily.   lubiprostone 24 MCG capsule Commonly known as: AMITIZA Take 24 mcg by mouth 2 (two) times daily with a meal.   MILK OF MAGNESIA PO Take 30 mLs by mouth daily as needed (constipation).   polyethylene glycol 17 g packet Commonly known as: MiraLax  Take 17 g by mouth daily as needed for moderate constipation or mild constipation (if no bowel movement in 2 days).   PRESCRIPTION MEDICATION Place 1 suppository rectally daily as needed (constipation). Enema Rectal - Sodium Phosphates    rosuvastatin  20 MG tablet Commonly known as: CRESTOR  Take 20 mg by mouth every evening.   Santyl 250 UNIT/GM ointment Generic drug: collagenase Apply 1 Application topically as directed.   senna-docusate 8.6-50 MG tablet Commonly known as: Senokot-S Take 1 tablet by mouth at bedtime.   tamsulosin  0.4 MG Caps capsule Commonly known as: FLOMAX  Take 0.4 mg by mouth daily.               Discharge Care Instructions  (From admission, onward)           Start     Ordered   12/18/23 0000  Discharge wound care:       Comments: Wound care  Daily      Comments: 1.        Cleanse R shin and L medial foot wounds with Vashe wound cleanser ,do not rinse and allow to air dry. Apply 1/4 thick layer of Santyl to wound beds daily, top with saline moist gauze, dry gauze and silicone foam.   2.        Cleanse R hip wound with Vashe, using a Q tip applicator insert Vashe moistened gauze into wound bed daily making sure to cover any area of depth, cover with dry gauze then ABD pad and tape or silicone foam whichever works best.  3.         Cover L hip and R upper back DTPI with silicone foam, lift daily to assess.  Change foam q3 days and prn soiling.  4.Cleanse coccyx wound with Vashe, apply Xeroform gauze  daily and secure with silicone foam.   12/18/23 1325            Follow-up Information     [name], MD. Schedule an appointment as soon as possible  for a visit in 2 week(s).   Specialty: Internal Medicine Why: for hospital  follow-up Contact information: [address] [place] [address] [phone]         Saintclair Jasper, MD. Schedule an appointment as soon as possible for a visit in 2 week(s).   Specialty: Gastroenterology Why: for hospital follow-up Contact information: [address] [place] [address] [phone]                Discharge Exam: S: Sleepy but easily arousable, denies any specific complaints, does not want to eat breakfast  BP 121/65 (BP Location: Right Arm)   Pulse 77   Temp 97.6 F (36.4 C)   Resp 16   Ht 5' 7 (1.702 m)   SpO2 97%   BMI 24.52 kg/m   Physical Exam General: Sleepy but easily arousable, one-word answers, Cardiovascular: S1 S2 clear, RRR.  Respiratory: CTAB, no wheezing, rales or rhonchi Gastrointestinal: Soft, nontender, nondistended, NBS Ext: no pedal edema bilaterally   Condition at discharge: fair  The results of significant diagnostics from this hospitalization (including imaging, microbiology, ancillary and laboratory) are listed below for reference.   Imaging Studies: CT HEAD WO CONTRAST ( ) Result Date: 12/18/2023 EXAM: CT HEAD WITHOUT CONTRAST 12/18/2023 03:54:10 AM TECHNIQUE: CT of the head was performed without the administration of intravenous contrast. Automated exposure control, iterative reconstruction, and/or weight based adjustment of the mA/kV was utilized to reduce the radiation dose to as low as reasonably achievable. COMPARISON: Head CT 11/17/2023. Brain MRI 11/18/2023. CLINICAL HISTORY: 83 year old male with head trauma, hospital fall, and reported head pain. History of dementia. FINDINGS: BRAIN AND VENTRICLES: No acute hemorrhage. No evidence of acute infarct. No hydrocephalus. No extra-axial collection. No mass effect or midline shift. Stable cerebral volume. Stable noncontrast CT appearance of chronic small vessel disease.  Calcified atherosclerosis. No suspicious intracranial vascular hyperdensity. ORBITS: No acute abnormality. SINUSES: Paranasal sinus mucosal thickening and bubbly opacity, and partial opacification of both middle ears and mastoids appears increased from last month. Negative visible nasopharynx. SOFT TISSUES AND SKULL: No acute soft tissue abnormality. No skull fracture. No acute orbital scalp soft tissue injury identified. IMPRESSION: 1. No acute traumatic injury identified 2. Stable non-contrast CT appearance of cerebral chronic small vessel disease. 3. Increased bilateral middle ear and mastoid opacification since last month: consider acute otitis media. Stable paranasal sinus inflammation Electronically signed by: Helayne Hurst MD 12/18/2023 04:12 AM EDT RP Workstation: [id number]   DG Hip Unilat W or Wo Pelvis 2-3 Views Right Result Date: 12/15/2023 CLINICAL DATA:  Wound. EXAM: DG HIP (WITH OR WITHOUT PELVIS) 2-3V RIGHT COMPARISON:  None Available. FINDINGS: There is no evidence of hip fracture or dislocation. Mild narrowing and osteophyte formation of right hip is noted. IMPRESSION: Mild degenerative joint disease of right hip. No acute abnormality seen. Electronically Signed   By: Lynwood Landy Raddle M.D.   On: 12/15/2023 16:59   DG Swallowing Func-Speech Pathology Result Date: 11/25/2023 Table formatting from the original result was not included. Modified Barium Swallow Study Patient Details Name: Damain Broadus MRN: [id number] Date of Birth: May 10, 1940 Today's Date: 11/25/2023 HPI/PMH: HPI: The pt is an 83 yo male presenting 9/9 after being found on ground by family. Admitted for management of rhabdomyolysis and possible UTI. PMH includes: HTN, HLD, CVA, DM II, tobacco use, BPH, and dementia. BSE 9/9 impulsive, no s/s aspiration. F/u 9/12 no s/s aspiration, rec Dys 3/thin and signed off. Coughing with thin during PT session 9/15 and swallow re-eval ordered. 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 Bulky anterior  osteophytes. Multilevel degenerative disc space narrowing. Fusion of  the facets at multiple levels with facet degenerative change. Clinical Impression: Patient presents with elements of pharyngeal dysphagia exacerbated by structural components. Patients oral phase is characterized by oral residuals, requiring occasional additional swallows to clear oral cavity. Majority of deficits are noted pharyngeally where patient achieves minimal epiglottic inversion and pharyngeal stasis due to chronic structural abnormalities. These deficits resulted in severe vallecular and pyriform sinus residuals as well as gross audible aspiration of large volumes of thin liquids. Patient with eventual silent aspiration of nectar and honey thick liquids residuals, though unable to differenciate consistency due to amount of pharyngeal residuals. SLP attempted use of chin tuck and effortful swallow, though these compensatory strategies were minimally effective and patient cognitively unable to utilize independently. Pharyngeal residuals were reduced after multiple dry swallows, however not eliminated. Per MD, no recent ethiologies that would result in overall change in swallow function and therefore patients dysphagia is likely due to chronic structural issues. Patient with [facility] WBC, vital signs and CXR. SLP to speak with family regarding safest swallow recommendations vs continuation of current diet. Factors that may increase risk of adverse event in presence of aspiration [name] 2021): Factors that may increase risk of adverse event in presence of aspiration [name] 2021): Reduced cognitive function; Aspiration of thick, dense, and/or acidic materials; Frequent aspiration of large volumes; Frail or deconditioned Recommendations/Plan: Swallowing Evaluation Recommendations Swallowing  Evaluation Recommendations Supervision: Full supervision/cueing for swallowing strategies Swallowing strategies  : Minimize environmental distractions; Slow rate; Small bites/sips; effortful swallow; Multiple dry swallows after each bite/sip Postural changes: Position pt fully upright for meals; Stay upright 30-60 min after meals Oral care recommendations: Oral care QID (4x/day) Treatment Plan Treatment Plan Treatment recommendations: Therapy as outlined in treatment plan below Follow-up recommendations: Skilled nursing-short term rehab (<3 hours/day) Functional status assessment: Patient has had a recent decline in their functional status and/or demonstrates limited ability to make significant improvements in function in a reasonable and predictable amount of time. Treatment frequency: Min 2x/week Treatment duration: 2 weeks Interventions: Aspiration precaution training; Oropharyngeal exercises; Compensatory techniques; Patient/family education; Diet toleration management by SLP Recommendations Recommendations for follow up therapy are one component of a multi-disciplinary discharge planning process, led by the attending physician.  Recommendations may be updated based on patient status, additional functional criteria and insurance authorization. Assessment: Orofacial Exam: Orofacial Exam Oral Cavity - Dentition: Adequate natural dentition Oral Motor/Sensory Function: WFL Anatomy: Anatomy: Suspected cervical osteophytes; Other (Comment) (Bulky anterior osteophytes. Multilevel degenerative disc space narrowing. Severe bilateral foraminal narrowing C3-C4 C6-C7C7-T1. Large posterior disc osteophyte at C3-C4 results in severe canal stenosis.) Boluses Administered: Boluses Administered Boluses Administered: Thin liquids (Level 0); Mildly thick liquids (Level 2, nectar thick); Moderately thick liquids (Level 3, honey thick); Puree  Oral Impairment Domain: Oral Impairment Domain Lip Closure: No labial escape Tongue  control during bolus hold: Cohesive bolus between tongue to palatal seal Bolus transport/lingual motion: Repetitive/disorganized tongue motion Oral residue: Residue collection on oral structures Initiation of pharyngeal swallow : Pyriform sinuses  Pharyngeal Impairment Domain: Pharyngeal Impairment Domain Soft palate elevation: Trace column of contrast or air between SP and PW Laryngeal elevation: Partial superior movement of thyroid  cartilage/partial approximation of arytenoids to epiglottic petiole Anterior hyoid excursion: Partial anterior movement Epiglottic movement: -- (limited inversion due to structure) Laryngeal vestibule closure: Incomplete, narrow column air/contrast in laryngeal vestibule Pharyngeal stripping  wave : Present - diminished Pharyngeal contraction (A/P view only): N/A Pharyngoesophageal segment opening: Partial distention/partial duration, partial obstruction of flow Tongue base retraction: Narrow column of contrast or air between tongue base and PPW Pharyngeal residue: Majority of contrast within or on pharyngeal structures Location of pharyngeal residue: Diffuse (>3 areas); Pyriform sinuses; Valleculae; Tongue base  Esophageal Impairment Domain: No data recorded Pill: No data recorded Penetration/Aspiration Scale Score: Penetration/Aspiration Scale Score 1.  Material does not enter airway: Puree 8.  Material enters airway, passes BELOW cords without attempt by patient to eject out (silent aspiration) : Thin liquids (Level 0); Mildly thick liquids (Level 2, nectar thick); Moderately thick liquids (Level 3, honey thick) Compensatory Strategies: Compensatory Strategies Compensatory strategies: Yes Effortful swallow: Ineffective Multiple swallows: -- (minimally effective) Chin tuck: Ineffective Liquid wash: Ineffective   General Information: Caregiver present: No  Diet Prior to this Study: Dysphagia 3 (mechanical soft); Thin liquids (Level 0)   Temperature : Normal   Respiratory Status: WFL    Supplemental O2: None (Room air)   No data recorded Behavior/Cognition: Alert; Cooperative; Pleasant mood No data recorded No data recorded No data recorded Volitional Swallow: Able to elicit Exam Limitations: No limitations Goal Planning: Prognosis for improved oropharyngeal function: Guarded No data recorded Barriers/Prognosis Comment: structural abnormalities Patient/Family Stated Goal: eat/drink Consulted and agree with results and recommendations: Patient Pain: Pain Assessment Pain Assessment: Faces Faces Pain Scale: 6 Pain Location: pt reports everywhere Pain Descriptors / Indicators: Discomfort; Grimacing; Guarding; Moaning Pain Intervention(s): Limited activity within patient's tolerance; Monitored during session; Repositioned End of Session: Start Time:SLP Start Time (ACUTE ONLY): 1230 Stop Time: SLP Stop Time (ACUTE ONLY): 1300 Time Calculation:SLP Time Calculation (min) (ACUTE ONLY): 30 min Charges: SLP Evaluations $ SLP Speech Visit: 1 Visit SLP Evaluations $BSS Swallow: 1 Procedure $MBS Swallow: 1 Procedure SLP visit diagnosis: SLP Visit Diagnosis: Dysphagia, unspecified (R13.10) Past Medical History: Past Medical History: Diagnosis Date  At low risk for fall   Cancer (HCC)   prostrate ca, radiation treatments  Chest pain   COPD (chronic obstructive pulmonary disease) (HCC)   Dementia (HCC)   Diabetes mellitus without complication (HCC)   Dizziness   History of tobacco abuse   Hyperlipidemia   Hypertension   Stroke [facility])   Subconjunctival hemorrhage of left eye 07/18/2019  The condition of the involved eye is that of a subconjunctival hemorrhage.  These are often spontaneous but are also often at or near the site of low location of an injection should to be placed into the eye.  In the absence of direct blunt or severe trauma these will typically resolve on their own spontaneously and have no impact on the vision.    These are very similar to having a bruise in your Past Surgical History: Past  Surgical History: Procedure Laterality Date  CHOLECYSTECTOMY N/A 01/30/2014  Procedure: LAPAROSCOPIC CHOLECYSTECTOMY;  Surgeon: [name], MD;  Location: [facility] OR;  Service: General;  Laterality: N/A; Cassidi F Sockwell 11/25/2023, 3:00 PM  US  RENAL Result Date: 11/24/2023 CLINICAL DATA:  Hematuria. EXAM: RENAL / URINARY TRACT ULTRASOUND COMPLETE COMPARISON:  August 27, 2015. FINDINGS: Right Kidney: Renal measurements: 11.6 x 5.0 x 4.8 cm = volume: 146 mL. Echogenicity within normal limits. No mass or hydronephrosis visualized. Left Kidney: Renal measurements: 11.3 x 4.8 x 5.9 cm = volume: 167 mL. Echogenicity within normal limits. No mass or hydronephrosis visualized. Bladder: Large amount of heterogeneous material is noted in the dependent portion of urinary bladder most consistent with debris  or possibly hemorrhage. Cystoscopy is recommended for further evaluation. Other: None. IMPRESSION: 1. Large amount of heterogeneous material is noted in the dependent portion of urinary bladder most consistent with debris or possibly hemorrhage. Cystoscopy is recommended to evaluate for possible mass. 2. No hydronephrosis or renal obstruction is noted. Electronically Signed   By: Lynwood Landy Raddle M.D.   On: 11/24/2023 12:20   CT LUMBAR SPINE WO CONTRAST Result Date: 11/19/2023 EXAM: CT OF THE LUMBAR SPINE WITHOUT CONTRAST 11/19/2023 12:49:09 PM TECHNIQUE: CT of the lumbar spine was performed without the administration of intravenous contrast. Multiplanar reformatted images are provided for review. Automated exposure control, iterative reconstruction, and/or weight based adjustment of the mA/kV was utilized to reduce the radiation dose to as low as reasonably achievable. COMPARISON: CT lumbar spine 05/27/2018 and same day CT thoracic spine. CLINICAL HISTORY: Low back pain, increased fracture risk. Chief complaints; Fall; CT THORACIC SPINE WO CONTRAST; Compression fracture, thoracic; CT LUMBAR SPINE WO CONTRAST; Low back  pain, increased fracture risk. FINDINGS: BONES AND ALIGNMENT: Similar grade 1 anterolisthesis of L4 on L5, alignment otherwise maintained. Similar appearance of bilateral pars defects at L3. Vertebral body heights are maintained. No evidence of acute compression fracture or displaced fracture in the lumbar spine. No suspicious lytic osseous lesion. DEGENERATIVE CHANGES: Degenerative endplate osteophytes, multiple levels endplate sclerosis most pronounced at L5-S1. Mild-to-moderate disc space narrowing at L5-S1 is similar to prior with associated vacuum disc. At T12-L1 there is no significant spinal canal or foraminal stenosis. At L1-L2 there are small posterior osteophytes and mild facet arthrosis. No significant spinal canal or foraminal stenosis. At L2-3 there is mild mineralization of the disc with small posterior osteophytes, additional prominent lateral osteophytes, particularly on the left, and mild facet arthrosis. There is no significant spinal canal or foraminal stenosis. Prominent lateral osteophytes with potential impingement upon the extraforaminal left L2 nerve root. At L3-4 there is a small disc bulge and posterior osteophytes, mild facet arthrosis, and mild spinal canal stenosis. Moderate bilateral foraminal stenosis, similar to prior. At L4-5, there is anterolisthesis, partial uncovering of the disc, diffuse disc bulge, moderate facet arthrosis, and thickening of the ligaments and flavum. There is moderate spinal canal stenosis, similar to prior. There is moderate-to-severe bilateral foraminal stenosis which appears slightly increased. At L5-S1 there is a diffuse disc bulge and posterior osteophytes, moderate-to-severe bilateral facet arthrosis. There is mild spinal canal stenosis, moderate-to-severe bilateral foraminal stenosis, slightly increased from prior. SOFT TISSUES: The paraspinal soft tissues are unremarkable. 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 IMPRESSION: 1. No  evidence of acute compression fracture or displaced fracture in the lumbar spine. 2. 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 3. Moderate-to-severe bilateral foraminal stenosis at L4-5 and L5-S1, increased from prior. Electronically signed by: Donnice Mania MD 11/19/2023 01:37 PM EDT RP Workstation: [id number]   CT THORACIC SPINE WO CONTRAST Result Date: 11/19/2023 EXAM: CT THORACIC SPINE WITHOUT CONTRAST 11/19/2023 12:49:09 PM TECHNIQUE: CT of the thoracic spine was performed without the administration of intravenous contrast. Multiplanar reformatted images are provided for review. Automated exposure control, iterative reconstruction, and/or weight based adjustment of the mA/kV was utilized to reduce the radiation dose to as low as reasonably achievable. COMPARISON: CT cervical spine 12/03/2018 and CT lumbar spine 05/27/2018. Thoracic spine radiographs 05/27/2018. CLINICAL HISTORY: Compression fracture, thoracic. Chief complaints; Fall; CT THORACIC SPINE WO CONTRAST; Compression fracture, thoracic; CT LUMBAR SPINE WO CONTRAST; Low back pain, increased  fracture risk. 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 There is focal disruption of the osteophytes anteriorly at the T9-10 level with lucency and gas extending through the defect into the anterior aspect of the T9-10 disc. Findings are concerning for fractured osteophytes. The vertebral body heights are maintained without evidence of compression fracture. Mild dextrocurvature of the thoracic spine centered at T8-9. Thoracic kyphosis is maintained. DEGENERATIVE CHANGES: Degenerative endplate osteophytes and degenerative endplate changes at multiple levels. There is no suspicious lytic osseous lesion. There is partial osseous fusion across the disc  spaces at T6-7 through T8-9. SOFT TISSUES: The paraspinal soft tissues are unremarkable. Small left pleural effusion. Trace pleural effusion on the right. VASCULATURE: Atherosclerosis of the coronary arteries. SPINAL CANAL AND FORAMINA: There is no high-grade osseous spinal canal stenosis. No CT evidence of large disc herniation. No high-grade osseous foraminal stenosis. IMPRESSION: 1. No acute compression fracture. 2. Findings concerning for fractured osteophytes at T9-10 with lucency and gas extending into the anterior aspect of the T9-10 disc. Consider MRI for further evaluation. Electronically signed by: Donnice Mania MD 11/19/2023 01:28 PM EDT RP Workstation: [id number]    Microbiology: Results for orders placed or performed during the hospital encounter of 12/15/23  MRSA Next Gen by PCR, Nasal     Status: None   Collection Time: 12/16/23  2:24 AM   Specimen: Nasal Mucosa; Nasal Swab  Result Value Ref Range Status   MRSA by PCR Next Gen NOT DETECTED NOT DETECTED Final    Comment: (NOTE) The GeneXpert MRSA Assay (FDA approved for NASAL specimens only), is one component of a comprehensive MRSA colonization surveillance program. It is not intended to diagnose MRSA infection nor to guide or monitor treatment for MRSA infections. Test performance is not FDA approved in patients less than 71 years old. Performed at [facility], [address] [place], [place] [address]     Labs: CBC: Recent Labs  Lab 12/15/23 1518 12/16/23 0040 12/16/23 0657 12/17/23 0553 12/18/23 0542  WBC 5.2 3.5*  --  3.9* 5.4  HGB 7.4* 7.2* 8.6* 9.1* 8.3*  HCT 25.5* 23.8* 28.2* 29.8* 27.8*  MCV 88.9 85.9  --  83.9 84.8  PLT 265 266  --  231 220   Basic Metabolic Panel: Recent Labs  Lab 12/15/23 1450 12/15/23 1518 12/16/23 0040 12/17/23 0553 12/18/23 0542  NA 144 142 142 142 140  K 4.3 4.5 4.1 3.9 4.1  CL 119* 117* 115* 113* 113*  CO2  --  14* 16* 19* 19*  GLUCOSE 154* 153* 105*  90 139*  BUN 34* 42* 42* 29* 38*  CREATININE 2.40* 2.14* 1.89* 1.68* 2.30*  CALCIUM   --  9.2 9.5 9.4 9.0  PHOS  --   --   --  3.8 3.2   Liver Function Tests: Recent Labs  Lab 12/15/23 1518 12/17/23 0553 12/18/23 0542  AST 17  --   --   ALT 15  --   --   ALKPHOS 53  --   --   BILITOT 0.2  --   --   PROT 5.9*  --   --   ALBUMIN 3.0* 3.1* 2.9*   CBG: Recent Labs  Lab 12/15/23 1458 12/17/23 1041  GLUCAP 166* 96    Discharge time spent: greater than 30 minutes.  Signed: Nydia Distance, MD [facility] 12/18/2023

## 2023-12-18 NOTE — Plan of Care (Signed)

## 2023-12-18 NOTE — Progress Notes (Signed)
 PT Cancellation Note  Patient Details Name: Peter Mann MRN: [id number] DOB: August 30, 1940   Cancelled Treatment:    Reason Eval/Treat Not Completed: Other (comment) Checked on pt 1108 and he was sleeping.  Pt would arouse but shook head no to therapist and return to sleep.  Tried to encourage to move as able, possibly sit up for lunch, etc but pt going back to sleep.  Will f/u as able (did note d/c orders now with plan to return to SNF) Benjiman, PT Acute Rehab [facility] [phone]   Benjiman VEAR Mulberry 12/18/2023, 1:28 PM

## 2023-12-20 LAB — TYPE AND SCREEN
ABO/RH(D): O POS
Antibody Screen: NEGATIVE
Unit division: 0
Unit division: 0

## 2023-12-20 LAB — BPAM RBC
Blood Product Expiration Date: 202511062359
Blood Product Expiration Date: 202511062359
ISSUE DATE / TIME: 202510080207
Unit Type and Rh: 5100
Unit Type and Rh: 5100

## 2024-01-12 ENCOUNTER — Encounter (HOSPITAL_COMMUNITY)

## 2024-01-15 ENCOUNTER — Ambulatory Visit (HOSPITAL_COMMUNITY)
Admission: RE | Admit: 2024-01-15 | Discharge: 2024-01-15 | Disposition: A | Discharge status: Home / Self Care | Source: Ambulatory Visit | Attending: Internal Medicine | Admitting: Internal Medicine

## 2024-01-15 VITALS — BP 88/54 | HR 73 | Temp 98.1°F | Resp 16

## 2024-01-15 DIAGNOSIS — D649 Anemia, unspecified: Secondary | ICD-10-CM | POA: Insufficient documentation

## 2024-01-15 DIAGNOSIS — D509 Iron deficiency anemia, unspecified: Secondary | ICD-10-CM | POA: Insufficient documentation

## 2024-01-15 MED ORDER — SODIUM CHLORIDE 0.9 % IV SOLN
510.0000 mg | Freq: Once | INTRAVENOUS | Status: AC
  Administered 2024-01-15: 510 mg via INTRAVENOUS
  Filled 2024-01-15: qty 510
